# Patient Record
Sex: Female | Born: 1970 | Race: Black or African American | Hispanic: No | Smoking: Former smoker
Health system: Southern US, Community
[De-identification: ages and names within clinical notes are randomized; demographics above are authoritative.]

## PROBLEM LIST (undated history)

## (undated) DIAGNOSIS — Z9889 Other specified postprocedural states: Secondary | ICD-10-CM

## (undated) DIAGNOSIS — F419 Anxiety disorder, unspecified: Secondary | ICD-10-CM

## (undated) DIAGNOSIS — G8929 Other chronic pain: Secondary | ICD-10-CM

## (undated) DIAGNOSIS — E785 Hyperlipidemia, unspecified: Secondary | ICD-10-CM

## (undated) DIAGNOSIS — R519 Headache, unspecified: Secondary | ICD-10-CM

## (undated) DIAGNOSIS — M549 Dorsalgia, unspecified: Secondary | ICD-10-CM

## (undated) DIAGNOSIS — K219 Gastro-esophageal reflux disease without esophagitis: Secondary | ICD-10-CM

## (undated) DIAGNOSIS — G709 Myoneural disorder, unspecified: Secondary | ICD-10-CM

## (undated) DIAGNOSIS — M797 Fibromyalgia: Secondary | ICD-10-CM

## (undated) DIAGNOSIS — F4310 Post-traumatic stress disorder, unspecified: Secondary | ICD-10-CM

## (undated) DIAGNOSIS — F1910 Other psychoactive substance abuse, uncomplicated: Secondary | ICD-10-CM

## (undated) DIAGNOSIS — F32A Depression, unspecified: Secondary | ICD-10-CM

## (undated) DIAGNOSIS — R109 Unspecified abdominal pain: Secondary | ICD-10-CM

## (undated) DIAGNOSIS — D649 Anemia, unspecified: Secondary | ICD-10-CM

## (undated) DIAGNOSIS — F329 Major depressive disorder, single episode, unspecified: Secondary | ICD-10-CM

## (undated) DIAGNOSIS — R51 Headache: Secondary | ICD-10-CM

## (undated) DIAGNOSIS — G629 Polyneuropathy, unspecified: Secondary | ICD-10-CM

## (undated) DIAGNOSIS — M542 Cervicalgia: Secondary | ICD-10-CM

## (undated) DIAGNOSIS — I10 Essential (primary) hypertension: Secondary | ICD-10-CM

## (undated) DIAGNOSIS — R112 Nausea with vomiting, unspecified: Secondary | ICD-10-CM

## (undated) DIAGNOSIS — I2510 Atherosclerotic heart disease of native coronary artery without angina pectoris: Secondary | ICD-10-CM

## (undated) DIAGNOSIS — E669 Obesity, unspecified: Secondary | ICD-10-CM

## (undated) DIAGNOSIS — M1990 Unspecified osteoarthritis, unspecified site: Secondary | ICD-10-CM

## (undated) HISTORY — DX: Other chronic pain: G89.29

## (undated) HISTORY — DX: Other psychoactive substance abuse, uncomplicated: F19.10

## (undated) HISTORY — DX: Unspecified osteoarthritis, unspecified site: M19.90

## (undated) HISTORY — DX: Hyperlipidemia, unspecified: E78.5

## (undated) HISTORY — DX: Obesity, unspecified: E66.9

## (undated) HISTORY — PX: WRIST SURGERY: SHX841

## (undated) HISTORY — DX: Headache: R51

## (undated) HISTORY — DX: Fibromyalgia: M79.7

## (undated) HISTORY — DX: Headache, unspecified: R51.9

## (undated) HISTORY — DX: Myoneural disorder, unspecified: G70.9

## (undated) SURGERY — LAPAROSCOPIC CHOLECYSTECTOMY WITH INTRAOPERATIVE CHOLANGIOGRAM
Anesthesia: General

---

## 2005-11-08 ENCOUNTER — Emergency Department (HOSPITAL_COMMUNITY): Admission: EM | Admit: 2005-11-08 | Discharge: 2005-11-08 | Payer: Self-pay | Admitting: Family Medicine

## 2006-02-24 ENCOUNTER — Encounter: Admission: RE | Admit: 2006-02-24 | Discharge: 2006-02-24 | Payer: Self-pay | Admitting: Family Medicine

## 2006-07-29 ENCOUNTER — Emergency Department (HOSPITAL_COMMUNITY): Admission: EM | Admit: 2006-07-29 | Discharge: 2006-07-29 | Payer: Self-pay | Admitting: Family Medicine

## 2007-05-25 ENCOUNTER — Emergency Department (HOSPITAL_COMMUNITY): Admission: EM | Admit: 2007-05-25 | Discharge: 2007-05-26 | Payer: Self-pay | Admitting: Emergency Medicine

## 2007-05-27 ENCOUNTER — Emergency Department (HOSPITAL_COMMUNITY): Admission: EM | Admit: 2007-05-27 | Discharge: 2007-05-27 | Payer: Self-pay | Admitting: Family Medicine

## 2008-03-05 ENCOUNTER — Emergency Department (HOSPITAL_COMMUNITY): Admission: EM | Admit: 2008-03-05 | Discharge: 2008-03-05 | Payer: Self-pay | Admitting: Emergency Medicine

## 2008-03-22 ENCOUNTER — Ambulatory Visit (HOSPITAL_COMMUNITY): Admission: RE | Admit: 2008-03-22 | Discharge: 2008-03-22 | Payer: Self-pay | Admitting: Orthopedic Surgery

## 2008-07-12 ENCOUNTER — Inpatient Hospital Stay (HOSPITAL_COMMUNITY): Admission: AD | Admit: 2008-07-12 | Discharge: 2008-07-13 | Payer: Self-pay | Admitting: Obstetrics and Gynecology

## 2008-07-27 ENCOUNTER — Ambulatory Visit (HOSPITAL_COMMUNITY): Admission: RE | Admit: 2008-07-27 | Discharge: 2008-07-27 | Payer: Self-pay | Admitting: Obstetrics and Gynecology

## 2008-09-27 ENCOUNTER — Encounter: Admission: RE | Admit: 2008-09-27 | Discharge: 2008-09-27 | Payer: Self-pay | Admitting: Obstetrics and Gynecology

## 2008-12-24 ENCOUNTER — Inpatient Hospital Stay (HOSPITAL_COMMUNITY): Admission: AD | Admit: 2008-12-24 | Discharge: 2008-12-24 | Payer: Self-pay | Admitting: Obstetrics and Gynecology

## 2009-02-20 ENCOUNTER — Inpatient Hospital Stay (HOSPITAL_COMMUNITY): Admission: RE | Admit: 2009-02-20 | Discharge: 2009-02-22 | Payer: Self-pay | Admitting: Obstetrics and Gynecology

## 2009-02-20 ENCOUNTER — Encounter (INDEPENDENT_AMBULATORY_CARE_PROVIDER_SITE_OTHER): Payer: Self-pay | Admitting: Obstetrics and Gynecology

## 2009-07-14 ENCOUNTER — Emergency Department (HOSPITAL_COMMUNITY): Admission: EM | Admit: 2009-07-14 | Discharge: 2009-07-14 | Payer: Self-pay | Admitting: Emergency Medicine

## 2009-12-03 ENCOUNTER — Emergency Department (HOSPITAL_COMMUNITY): Admission: EM | Admit: 2009-12-03 | Discharge: 2009-12-04 | Payer: Self-pay | Admitting: Emergency Medicine

## 2010-06-21 ENCOUNTER — Observation Stay (HOSPITAL_COMMUNITY): Admission: EM | Admit: 2010-06-21 | Discharge: 2010-06-22 | Payer: Self-pay | Admitting: Emergency Medicine

## 2010-08-28 ENCOUNTER — Emergency Department (HOSPITAL_COMMUNITY)
Admission: EM | Admit: 2010-08-28 | Discharge: 2010-08-28 | Payer: Self-pay | Source: Home / Self Care | Admitting: Emergency Medicine

## 2010-09-26 ENCOUNTER — Encounter: Admission: RE | Admit: 2010-09-26 | Payer: Self-pay | Admitting: Orthopedic Surgery

## 2010-11-15 ENCOUNTER — Emergency Department (HOSPITAL_COMMUNITY)
Admission: EM | Admit: 2010-11-15 | Discharge: 2010-11-15 | Payer: Self-pay | Source: Home / Self Care | Admitting: Emergency Medicine

## 2010-11-15 LAB — DIFFERENTIAL
Basophils Relative: 0 % (ref 0–1)
Eosinophils Absolute: 0 10*3/uL (ref 0.0–0.7)
Neutrophils Relative %: 70 % (ref 43–77)

## 2010-11-15 LAB — CBC
Hemoglobin: 11.3 g/dL — ABNORMAL LOW (ref 12.0–15.0)
RBC: 4.38 MIL/uL (ref 3.87–5.11)
WBC: 8.1 10*3/uL (ref 4.0–10.5)

## 2010-11-15 LAB — URINALYSIS, ROUTINE W REFLEX MICROSCOPIC
Bilirubin Urine: NEGATIVE
Leukocytes, UA: NEGATIVE
Nitrite: NEGATIVE
Urine Glucose, Fasting: 250 mg/dL — AB

## 2010-11-15 LAB — URINE MICROSCOPIC-ADD ON

## 2010-11-15 LAB — BASIC METABOLIC PANEL
BUN: 5 mg/dL — ABNORMAL LOW (ref 6–23)
CO2: 24 mEq/L (ref 19–32)
Calcium: 9.3 mg/dL (ref 8.4–10.5)
Creatinine, Ser: 0.78 mg/dL (ref 0.4–1.2)
GFR calc non Af Amer: >60 mL/min (ref >60)
Glucose, Bld: 238 mg/dL — ABNORMAL HIGH (ref 70–99)
Sodium: 136 mEq/L (ref 135–145)

## 2010-11-15 LAB — PREGNANCY, URINE: Preg Test, Ur: NEGATIVE

## 2011-01-03 LAB — BASIC METABOLIC PANEL
CO2: 26 mEq/L (ref 19–32)
Calcium: 9.7 mg/dL (ref 8.4–10.5)
Chloride: 102 mEq/L (ref 96–112)
Creatinine, Ser: 0.58 mg/dL (ref 0.4–1.2)
GFR calc Af Amer: >60 mL/min (ref >60)
GFR calc non Af Amer: >60 mL/min (ref >60)

## 2011-01-03 LAB — GLUCOSE, CAPILLARY: Glucose-Capillary: 306 mg/dL — ABNORMAL HIGH (ref 70–99)

## 2011-01-03 LAB — CBC
Hemoglobin: 12.4 g/dL (ref 12.0–15.0)
MCH: 27.3 pg (ref 26.0–34.0)
RDW: 14.8 % (ref 11.5–15.5)
WBC: 7.5 10*3/uL (ref 4.0–10.5)

## 2011-01-03 LAB — CK TOTAL AND CKMB (NOT AT ARMC)
Relative Index: INVALID (ref 0.0–2.5)
Total CK: 53 U/L (ref 7–177)

## 2011-01-03 LAB — POCT CARDIAC MARKERS
CKMB, poc: <1 ng/mL — ABNORMAL LOW (ref 1.0–8.0)
Myoglobin, poc: 35.9 ng/mL (ref 12–200)

## 2011-01-03 LAB — DIFFERENTIAL
Lymphocytes Relative: 33 % (ref 12–46)
Monocytes Absolute: 0.4 10*3/uL (ref 0.1–1.0)
Neutro Abs: 4.6 10*3/uL (ref 1.7–7.7)

## 2011-01-03 LAB — LIPID PANEL
Cholesterol: 231 mg/dL — ABNORMAL HIGH (ref 0–200)
LDL Cholesterol: UNDETERMINED mg/dL (ref 0–99)
Total CHOL/HDL Ratio: 5.3 RATIO
Triglycerides: 411 mg/dL — ABNORMAL HIGH (ref <150)
VLDL: UNDETERMINED mg/dL (ref 0–40)

## 2011-01-03 LAB — URINALYSIS, ROUTINE W REFLEX MICROSCOPIC
Bilirubin Urine: NEGATIVE
Glucose, UA: >1000 mg/dL — AB
Hgb urine dipstick: NEGATIVE
Ketones, ur: 15 mg/dL — AB
Protein, ur: NEGATIVE mg/dL

## 2011-01-03 LAB — CARDIAC PANEL(CRET KIN+CKTOT+MB+TROPI)
Relative Index: INVALID (ref 0.0–2.5)
Total CK: 54 U/L (ref 7–177)

## 2011-01-03 LAB — PROTIME-INR: Prothrombin Time: 13.1 seconds (ref 11.6–15.2)

## 2011-01-09 LAB — POCT I-STAT, CHEM 8
BUN: <3 mg/dL — ABNORMAL LOW (ref 6–23)
Chloride: 106 mEq/L (ref 96–112)
Creatinine, Ser: 0.5 mg/dL (ref 0.4–1.2)
Sodium: 137 mEq/L (ref 135–145)
TCO2: 26 mmol/L (ref 0–100)

## 2011-01-09 LAB — DIFFERENTIAL
Basophils Relative: 0 % (ref 0–1)
Eosinophils Absolute: 0.1 10*3/uL (ref 0.0–0.7)
Eosinophils Relative: 1 % (ref 0–5)
Lymphs Abs: 2.3 10*3/uL (ref 0.7–4.0)
Monocytes Absolute: 0.4 10*3/uL (ref 0.1–1.0)
Neutro Abs: 8 10*3/uL — ABNORMAL HIGH (ref 1.7–7.7)
Neutrophils Relative %: 74 % (ref 43–77)

## 2011-01-09 LAB — CBC
HCT: 31.7 % — ABNORMAL LOW (ref 36.0–46.0)
MCHC: 33 g/dL (ref 30.0–36.0)
MCV: 84.5 fL (ref 78.0–100.0)
RBC: 3.76 MIL/uL — ABNORMAL LOW (ref 3.87–5.11)

## 2011-01-29 LAB — GLUCOSE, CAPILLARY
Glucose-Capillary: 131 mg/dL — ABNORMAL HIGH (ref 70–99)
Glucose-Capillary: 157 mg/dL — ABNORMAL HIGH (ref 70–99)
Glucose-Capillary: 93 mg/dL (ref 70–99)

## 2011-01-29 LAB — CBC
HCT: 31.1 % — ABNORMAL LOW (ref 36.0–46.0)
Hemoglobin: 10.4 g/dL — ABNORMAL LOW (ref 12.0–15.0)
MCV: 84.7 fL (ref 78.0–100.0)
RBC: 3.67 MIL/uL — ABNORMAL LOW (ref 3.87–5.11)
WBC: 7.5 10*3/uL (ref 4.0–10.5)

## 2011-01-30 LAB — CBC
HCT: 36 % (ref 36.0–46.0)
Hemoglobin: 12.3 g/dL (ref 12.0–15.0)
RBC: 4.29 MIL/uL (ref 3.87–5.11)
RDW: 16.3 % — ABNORMAL HIGH (ref 11.5–15.5)
WBC: 7.7 10*3/uL (ref 4.0–10.5)

## 2011-01-30 LAB — DIFFERENTIAL
Basophils Absolute: 0 10*3/uL (ref 0.0–0.1)
Basophils Relative: 0 % (ref 0–1)
Eosinophils Absolute: 0.1 10*3/uL (ref 0.0–0.7)
Monocytes Absolute: 0.4 10*3/uL (ref 0.1–1.0)
Monocytes Relative: 5 % (ref 3–12)
Neutro Abs: 5.3 10*3/uL (ref 1.7–7.7)
Neutrophils Relative %: 69 % (ref 43–77)

## 2011-01-30 LAB — COMPREHENSIVE METABOLIC PANEL
ALT: 17 U/L (ref 0–35)
Alkaline Phosphatase: 150 U/L — ABNORMAL HIGH (ref 39–117)
BUN: 4 mg/dL — ABNORMAL LOW (ref 6–23)
Chloride: 105 mEq/L (ref 96–112)
Glucose, Bld: 141 mg/dL — ABNORMAL HIGH (ref 70–99)
Potassium: 4.1 mEq/L (ref 3.5–5.1)
Sodium: 133 mEq/L — ABNORMAL LOW (ref 135–145)
Total Bilirubin: 0.1 mg/dL — ABNORMAL LOW (ref 0.3–1.2)
Total Protein: 6.3 g/dL (ref 6.0–8.3)

## 2011-01-31 LAB — URINALYSIS, ROUTINE W REFLEX MICROSCOPIC
Glucose, UA: NEGATIVE mg/dL
Ketones, ur: NEGATIVE mg/dL
Protein, ur: NEGATIVE mg/dL
Urobilinogen, UA: 0.2 mg/dL (ref 0.0–1.0)

## 2011-01-31 LAB — URINE MICROSCOPIC-ADD ON

## 2011-03-05 NOTE — Op Note (Signed)
NAMESERA, HITSMAN              ACCOUNT NO.:  [id number]   MEDICAL RECORD NO.:  [id number]          PATIENT TYPE:  INP   LOCATION:  9102                          FACILITY:  [facility]   PHYSICIAN:  Malachi Pro. Ambrose Mantle, M.D. DATE OF BIRTH:  1971/04/07   DATE OF PROCEDURE:  02/20/2009  DATE OF DISCHARGE:                               OPERATIVE REPORT   PREOPERATIVE DIAGNOSES:  1. Intrauterine pregnancy at 39 plus weeks.  2. Diabetes mellitus type 2.  3. History of high blood pressure.  4. History of depression.  5. Voluntary sterilization.   POSTOPERATIVE DIAGNOSES:  1. Intrauterine pregnancy at 39 plus weeks.  2. Diabetes mellitus type 2.  3. History of high blood pressure.  4. History of depression.  5. Voluntary sterilization.  6. Adhesions with scarring in the abdominal wall.   OPERATION:  Low transverse cervical cesarean section and bilateral tubal  ligation.   OPERATOR:  Malachi Pro. Ambrose Mantle, MD   ASSISTANT:  Zenaida Niece, MD   ANESTHESIA:  Spinal.   PROCEDURE IN DETAIL:  The patient was brought to the operating room and  given a spinal anesthetic by Dr. Malen Gauze.  She was placed in left lateral  tilt position.  The abdomen was prepped with Betadine solution.  The  urethra was prepped and a Foley catheter was inserted to straight drain.  Exam revealed the cervix to be closed more nearly so and the presenting  part was fairly high.  The abdomen was then draped as a sterile field.  The previous incision was fairly low and there was intention above the  midportion of the incision where the incision according to the patient  had come open.  I elected to use entry point above the old incision, I  made a transverse incision, carried the incision in layers down through  the skin, subcutaneous tissue, and fascia.  I did encounter a good bit  of scarring in the fascia and the rectus muscle from the fascia  superiorly and inferiorly, entered the peritoneal cavity and doing so,  stretched the incision, so that I could get the baby out.  Visualize the  lower uterine segment, I made an incision into the superficial layers of  the myometrium with the knife and then used my finger to enter the  amniotic sac.  Clear fluid was obtained, enlarged the incision by  pulling superiorly and inferiorly.  The vertex was then delivered into  the incisional opening and a 6 pounds 8 ounces female infant was  delivered without Apgars of 9 at 1 and 9 at 5 minutes, Dr. Alison Murray  assigned the Apgars.  I took a good bit of time after the cord was  clamped to get the placenta out since the cord blood bank  representatives were there.  The uterus clamped down with Pitocin.  The  placenta was removed.  The inside of the uterus was inspected, found to  be free of any debris.  The incision was pretty close to the bladder at  the right angle, so I closed the right angle first, then closed the  uterus in  the normal fashion with running suture locking 0 Vicryl for  the first layer, nonlocking for the second layer.  Hemostasis was  achieved with a couple of figure-of-eight sutures.  Both tubes and  ovaries and the uterus appeared normal.  I pulled up the midportion of  both tubes, created a window in the mesosalpinx, placed 2 ties of 0  plain catgut proximally and distally, cut out the intervening segment of  tube, hemostasis was adequate.  I reinspected the uterine incision.  There was no significant bleeding, liberally irrigated the incision, and  closed the abdominal wall with interrupted sutures of 0 Vicryl  incorporating the rectus muscle and peritoneum, 2 running sutures of 0  Vicryl on the fascia, running 3-0 Vicryl on the subcu tissue, and  staples on the skin.  The patient seemed to tolerated the procedure  well.  Blood loss about 1000 mL.  Sponge and needle counts were correct.  She was returned to recovery in satisfactory condition.       Malachi Pro. Ambrose Mantle, M.D.  Electronically  Signed     TFH/MEDQ  D:  02/20/2009  T:  02/20/2009  Job:  [id number]

## 2011-03-05 NOTE — Discharge Summary (Signed)
Vanessa Case, HADLOCK              ACCOUNT NO.:  [id number]   MEDICAL RECORD NO.:  [id number]          PATIENT TYPE:  INP   LOCATION:  9102                          [facility]ACILITY:  [facility]   PHYSICIAN:  Malachi Pro. Ambrose Mantle, M.D. DATE OF BIRTH:  1970-11-05   DATE OF ADMISSION:  02/20/2009  DATE OF DISCHARGE:  02/22/2009                               DISCHARGE SUMMARY   This is a 40 year old black female para 2-0-0-2, gravida 3, EDC Feb 24, 2009, admitted for repeat C-section after 2 previous C-sections.  Blood  group and type was O+, negative antibody, sickle cell negative, RPR  nonreactive, rubella immune, hepatitis B surface antigen negative, HIV  negative, GC and Chlamydia negative, group B strep negative.  The  patient had diabetes controlled with glyburide and metformin.  She took  Valtrex the last month of pregnancy.  She was admitted and underwent a  low-transverse cervical C-section by Dr. Ambrose Mantle and also tubal ligation.  Procedure was done under spinal anesthesia without known complications.  Postpartum, the patient did quite well, had no significant problems,  tolerated ambulation well, tolerated a regular diet, voided well without  difficulty, passed flatus freely and on the second postpartum day, she  was ready for discharge.   LABORATORY DATA:  Initial hemoglobin of 12.3, hematocrit 36.0, white  count 77,100, platelet count of 150,000.  Followup hemoglobin 10.4,  hematocrit 31.1, and platelet count 130,000.  Comprehensive metabolic  profile showed a sodium of 133, glucose of 141, alkaline phosphatase of  150, albumin of 2.6, otherwise within normal limits.  RPR was  nonreactive.  Capillary blood glucoses after surgery were 93, 92, 78,  131, 110, and 157.   FINAL DIAGNOSES:  Intrauterine pregnancy at 39 weeks delivered vertex by  cesarean section, voluntary sterilization, diabetes, history of high  blood pressure, history of depression, alcohol and drug abuse.   OPERATION:   Low-transverse cervical C-section and bilateral tubal  ligation.   FINAL CONDITION:  Improved.   INSTRUCTIONS:  Our regular discharge instruction booklet.  The patient  was given a prescription for Darvocet-N 100, 36 tablets 1 every 3-4  hours as needed for pain and Motrin 600 mg 30 tablets 1 every 6 hours as  needed for pain.  She is to return my office in 2 days to have her  staples removed.       Malachi Pro. Ambrose Mantle, M.D.  Electronically Signed     TFH/MEDQ  D:  02/22/2009  T:  02/22/2009  Job:  [id number]

## 2011-03-05 NOTE — H&P (Signed)
Vanessa Case, Vanessa Case              ACCOUNT NO.:  [id number]   MEDICAL RECORD NO.:  [id number]          PATIENT TYPE:  INP   LOCATION:                                [facility]   PHYSICIAN:  Malachi Pro. Ambrose Mantle, M.D. DATE OF BIRTH:  01-29-1971   DATE OF ADMISSION:  02/20/2009  DATE OF DISCHARGE:                              HISTORY & PHYSICAL   PRESENT ILLNESS:  A 40 year old black female para 2-0-0-2, gravida 3,  EDC Feb 24, 2009 admitted for repeat C-section after having two prior C-  sections.  She also wants bilateral tubal ligation.  This patient had a  vaginal ultrasound on July 27, 2008 that showed a gestational age of [redacted]  weeks and 3 days with an [facility] of Feb 26, 2009.  At the time of the onset  of her pregnancy, she was being treated for diabetes.  She had a history  of hypertension and a history of herpes simplex virus, as well as  depression and alcohol and drug abuse.  She was on glipizide 10 mg twice  a day, metformin, Prozac, and Norvasc.  She was told to stop the  Norvasc.  She had a hemoglobin A1c of 6.3.  She then had an 18-week  ultrasound that was compatible with dates placing her due date at Feb 26, 2009.  Some of the anatomy was not seen; so, this ultrasound was  repeated at 21 weeks and 5 days and anatomy was seen.  The patient  underwent a fetal ultrasound by Dr. Elizebeth Brooking that showed a normal fetal  echocardiogram.  The patient continued on her metformin and glipizide.  She kept good records of her blood sugars four times a day.  They were  not perfect but rarely were significantly elevated.  She did undergo a  repeat hemoglobin A1c on January 12, 2009 which was 6.3.  Lipocyanine was  unchanged at 221.  She underwent renal function tests on March 22  showing total protein of 125 mg in 24 hours, creatinine clearance of  128, and serum creatinine of 0.55.  This patient's blood group and type  is O+, negative antibodies, sickle cell negative, RPR nonreactive,  rubella  immune, hepatitis B surface antigen negative, HIV negative, GC  and chlamydia negative, group B strep negative.  Her glyburide is at 5  mg every day, metformin 1000 mg twice a day, and she has been taking  Valtrex since 36 weeks' gestation.  Prior to her last office visit, her  maximum fasting blood sugar in the prior week had been 102, 1-hour blood  sugar after breakfast 146 maximum, after lunch 138, and after supper  128.  She is admitted now for repeat C-section and she desires tubal  ligation.   PAST MEDICAL HISTORY:  Reveals allergy to hydrocodone which caused  vomiting.  Illnesses:  She does have a history of diabetes, drug and  alcohol abuse depression, and high blood pressure.  She also has a  history of genital herpes.  Operations:  She has had two cesarean  sections.   OBSTETRICAL HISTORY:  In April 1993,  she delivered by C-section for  fetal stress.  The baby was a 6 pound 12 ounce female and in November  1995, she delivered a 7 pound 2 ounce female by cesarean section.  She  apparently had active herpes at that time.   SOCIAL HISTORY:  Alcohol, tobacco, and drugs:  None at this time.   FAMILY HISTORY:  Mother has diabetes.  Paternal grandmother had CVAs and  high blood pressure.  Paternal grandfather had heart disease.   PHYSICAL EXAMINATION:  This is a well-developed, well-nourished obese  black female in no distress.  Blood pressure is 126/80, pulse is 80, weight is 210 pounds.  HEENT:  Normal.  LUNGS:  Clear to auscultation.  HEART:  Normal size and sounds.  No murmurs.  ABDOMEN:  Soft.  Fundal height on 04/29 was 39 cm.  Fetal heart tones  were normal.  The cervix was long and closed.   IMPRESSION:  Intrauterine pregnancy at 39+ weeks, prior C-section x2,  request for voluntary sterilization, diabetes, history of high blood  pressure even though it has been controlled without medication during  this pregnancy, history of herpes simplex virus, history of depression,   alcohol and drug abuse.  The patient is prepared for C-section and tubal  ligation.      Malachi Pro. Ambrose Mantle, M.D.  Electronically Signed     TFH/MEDQ  D:  02/19/2009  T:  02/19/2009  Job:  [id number]

## 2011-03-15 ENCOUNTER — Emergency Department (HOSPITAL_COMMUNITY): Payer: Medicaid Other

## 2011-03-15 ENCOUNTER — Inpatient Hospital Stay (HOSPITAL_COMMUNITY)
Admission: EM | Admit: 2011-03-15 | Discharge: 2011-03-17 | DRG: 313 | Disposition: A | Discharge status: Home / Self Care | Payer: Medicaid Other | Attending: Internal Medicine | Admitting: Internal Medicine

## 2011-03-15 DIAGNOSIS — Z87891 Personal history of nicotine dependence: Secondary | ICD-10-CM

## 2011-03-15 DIAGNOSIS — F319 Bipolar disorder, unspecified: Secondary | ICD-10-CM | POA: Diagnosis present

## 2011-03-15 DIAGNOSIS — I10 Essential (primary) hypertension: Secondary | ICD-10-CM | POA: Diagnosis present

## 2011-03-15 DIAGNOSIS — R079 Chest pain, unspecified: Secondary | ICD-10-CM

## 2011-03-15 DIAGNOSIS — Z9119 Patient's noncompliance with other medical treatment and regimen: Secondary | ICD-10-CM

## 2011-03-15 DIAGNOSIS — Z91199 Patient's noncompliance with other medical treatment and regimen due to unspecified reason: Secondary | ICD-10-CM

## 2011-03-15 DIAGNOSIS — E669 Obesity, unspecified: Secondary | ICD-10-CM | POA: Diagnosis present

## 2011-03-15 DIAGNOSIS — E785 Hyperlipidemia, unspecified: Secondary | ICD-10-CM | POA: Diagnosis present

## 2011-03-15 DIAGNOSIS — R0789 Other chest pain: Principal | ICD-10-CM | POA: Diagnosis present

## 2011-03-15 DIAGNOSIS — E119 Type 2 diabetes mellitus without complications: Secondary | ICD-10-CM | POA: Diagnosis present

## 2011-03-15 DIAGNOSIS — Z79899 Other long term (current) drug therapy: Secondary | ICD-10-CM

## 2011-03-15 DIAGNOSIS — Z8249 Family history of ischemic heart disease and other diseases of the circulatory system: Secondary | ICD-10-CM

## 2011-03-15 DIAGNOSIS — E781 Pure hyperglyceridemia: Secondary | ICD-10-CM | POA: Diagnosis present

## 2011-03-15 DIAGNOSIS — Z7982 Long term (current) use of aspirin: Secondary | ICD-10-CM

## 2011-03-15 LAB — LIPID PANEL
HDL: 39 mg/dL — ABNORMAL LOW (ref >39)
Triglycerides: 226 mg/dL — ABNORMAL HIGH (ref <150)
VLDL: 45 mg/dL — ABNORMAL HIGH (ref 0–40)

## 2011-03-15 LAB — GLUCOSE, CAPILLARY: Glucose-Capillary: 225 mg/dL — ABNORMAL HIGH (ref 70–99)

## 2011-03-15 LAB — BASIC METABOLIC PANEL
BUN: 6 mg/dL (ref 6–23)
CO2: 29 mEq/L (ref 19–32)
Chloride: 100 mEq/L (ref 96–112)
Glucose, Bld: 155 mg/dL — ABNORMAL HIGH (ref 70–99)
Potassium: 3.4 mEq/L — ABNORMAL LOW (ref 3.5–5.1)

## 2011-03-15 LAB — CBC
HCT: 33.7 % — ABNORMAL LOW (ref 36.0–46.0)
MCV: 79.7 fL (ref 78.0–100.0)
RBC: 4.23 MIL/uL (ref 3.87–5.11)
WBC: 8.5 10*3/uL (ref 4.0–10.5)

## 2011-03-15 LAB — PROTIME-INR: INR: 0.93 (ref 0.00–1.49)

## 2011-03-15 LAB — DIFFERENTIAL
Eosinophils Relative: 1 % (ref 0–5)
Lymphocytes Relative: 40 % (ref 12–46)
Lymphs Abs: 3.4 10*3/uL (ref 0.7–4.0)
Neutrophils Relative %: 54 % (ref 43–77)

## 2011-03-15 LAB — TROPONIN I: Troponin I: <0.3 ng/mL (ref <0.30)

## 2011-03-15 LAB — D-DIMER, QUANTITATIVE: D-Dimer, Quant: 0.24 ug/mL-FEU (ref 0.00–0.48)

## 2011-03-15 LAB — APTT: aPTT: 32 seconds (ref 24–37)

## 2011-03-16 ENCOUNTER — Inpatient Hospital Stay (HOSPITAL_COMMUNITY): Payer: Medicaid Other

## 2011-03-16 LAB — COMPREHENSIVE METABOLIC PANEL
ALT: 33 U/L (ref 0–35)
AST: 30 U/L (ref 0–37)
Albumin: 3.4 g/dL — ABNORMAL LOW (ref 3.5–5.2)
CO2: 30 mEq/L (ref 19–32)
Calcium: 9.5 mg/dL (ref 8.4–10.5)
GFR calc Af Amer: >60 mL/min (ref >60)
GFR calc non Af Amer: >60 mL/min (ref >60)
Sodium: 138 mEq/L (ref 135–145)
Total Protein: 7 g/dL (ref 6.0–8.3)

## 2011-03-16 LAB — CBC
HCT: 32 % — ABNORMAL LOW (ref 36.0–46.0)
Hemoglobin: 10.2 g/dL — ABNORMAL LOW (ref 12.0–15.0)
MCHC: 31.9 g/dL (ref 30.0–36.0)
MCV: 80.4 fL (ref 78.0–100.0)
RDW: 15.2 % (ref 11.5–15.5)
WBC: 9.1 10*3/uL (ref 4.0–10.5)

## 2011-03-16 LAB — GLUCOSE, CAPILLARY
Glucose-Capillary: 251 mg/dL — ABNORMAL HIGH (ref 70–99)
Glucose-Capillary: 79 mg/dL (ref 70–99)

## 2011-03-16 LAB — CARDIAC PANEL(CRET KIN+CKTOT+MB+TROPI)
CK, MB: 0.5 ng/mL (ref 0.3–4.0)
Relative Index: INVALID (ref 0.0–2.5)

## 2011-03-16 LAB — HEPARIN LEVEL (UNFRACTIONATED): Heparin Unfractionated: 0.82 IU/mL — ABNORMAL HIGH (ref 0.30–0.70)

## 2011-03-16 MED ORDER — TECHNETIUM TC 99M TETROFOSMIN IV KIT
30.0000 | PACK | Freq: Once | INTRAVENOUS | Status: AC | PRN
  Administered 2011-03-16: 30 via INTRAVENOUS

## 2011-03-17 ENCOUNTER — Other Ambulatory Visit (HOSPITAL_COMMUNITY): Payer: Self-pay

## 2011-03-17 LAB — CBC
HCT: 31.8 % — ABNORMAL LOW (ref 36.0–46.0)
Hemoglobin: 10.3 g/dL — ABNORMAL LOW (ref 12.0–15.0)
MCH: 26 pg (ref 26.0–34.0)
MCHC: 32.4 g/dL (ref 30.0–36.0)
MCV: 80.3 fL (ref 78.0–100.0)
Platelets: 312 K/uL (ref 150–400)
RBC: 3.96 MIL/uL (ref 3.87–5.11)
RDW: 15.1 % (ref 11.5–15.5)
WBC: 10.6 K/uL — ABNORMAL HIGH (ref 4.0–10.5)

## 2011-03-17 LAB — HEPARIN LEVEL (UNFRACTIONATED): Heparin Unfractionated: 0.6 [IU]/mL (ref 0.30–0.70)

## 2011-03-21 NOTE — Discharge Summary (Signed)
Vanessa Case, Vanessa Case              ACCOUNT NO.:  [id number]  MEDICAL RECORD NO.:  [id number]           PATIENT TYPE:  I  LOCATION:  3714                         FACILITY:  [facility]  PHYSICIAN:  Cassell Clement, M.D. DATE OF BIRTH:  18-Jan-1971  DATE OF ADMISSION:  03/15/2011 DATE OF DISCHARGE:  03/17/2011                              DISCHARGE SUMMARY   PRIMARY CARE PHYSICIAN:  [facility].  CARDIOLOGIST:  Hillis Range, MD  FINAL DISCHARGE DIAGNOSES: 1. Atypical chest pain.     a.     Stress Myoview, completed Mar 15, 2001, which is a 2-day      stress test revealed normal left ventricular systolic function      with ejection fraction 67% with no perfusion defects identified.  FINAL DISCHARGE DIAGNOSES: 1. Musculoskeletal chest pain. 2. Diabetes. 3. Hypertension. 4. Hyperlipidemia. 5. Obesity. 6. Family history of coronary artery disease. 7. Bipolar disorder and depression. 8. History of noncompliance with medication secondary to financial     issues.  HOSPITAL COURSE:  This is a 40 year old obese, African American female who was admitted on Mar 13, 2011, with complaints of shortness of breath, chest discomfort described as throbbing pain.  The patient's heart rate was mildly elevated.  The patient took some Advil, which was not helpful to her.  The night prior to admission, she had left jaw aching and left hand tingling and slept poorly.  She developed chest heaviness graded 8/10.  Worse with supine position, better with standing.  The patient came to the emergency room where she was given aspirin and sublingual nitroglycerin.  The pain went away and heaviness decreased.  She was admitted to rule out myocardial infarction.  It was noted that the patient's EKG was normal with negative troponins.  The patient did undergo a 2-day stress Myoview with results as described above.  It was found that the patient had no evidence of coronary ischemia and also had negative  troponins throughout hospitalization.  It was noted that her hemoglobinA1c was elevated at 9.6.  The patient was seen and examined by Dr. Patty Sermons and found to be stable for discharge. She is to follow up with her primary care physician and have better medical management considering her diabetes.  No further cardiac intervention or testing is warranted at this time.  LABS ON DISCHARGE:  Hemoglobin 10.3, hematocrit 31.8, white blood cells 10.6, platelet 312.  Cardiac enzymes negative x3  less than 0.30. Sodium 138, potassium 3.7, chloride 100, CO2 30, glucose 235, BUN 11, creatinine 0.83.  LFTs were within normal limits.  Hemoglobin A1c was 9.6 with a mean plasma glucose of 229, D-dimer 0.24.  RADIOLOGY:  Chest x-ray revealing no acute findings.  Nuclear medicine stress Myoview negative for ischemia.  VITAL SIGNS ON DISCHARGE:  Blood pressure 117/75, pulse 77, respirations 18, temperature 97.9, O2 sat 100% on room air.  DISCHARGE MEDICATIONS: 1. Lisinopril 20 mg 1 p.o. daily. 2. Vitamin B12 daily. 3. Prozac 20 mg daily. 4. Glipizide 10 mg daily. 5. Aspirin 81 mg daily. 6. Pantoprazole 80 mg b.i.d. 7. Metoprolol 12.5 mg b.i.d.  ALLERGIES:  HYDROCODONE.  FOLLOWUP  PLANS AND APPOINTMENT: 1. The patient will follow up with her primary care physician.  She     will not need any further followup concerning Cardiology, etiology     with no further invasive testing or other testing at this time.     Time spent with the patient to include physician time 30 minutes.     Bettey Mare. Lyman Bishop, NP   ______________________________ Cassell Clement, M.D.    KML/MEDQ  D:  03/17/2011  T:  03/17/2011  Job:  [id number]  cc:   [facility]  Electronically Signed by Joni Reining NP on 03/18/2011 09:04:33 PM Electronically Signed by Cassell Clement M.D. on 03/21/2011 12:48:16 PM

## 2011-04-14 NOTE — H&P (Signed)
Vanessa Case, Vanessa Case              ACCOUNT NO.:  [id number]  MEDICAL RECORD NO.:  [id number]           PATIENT TYPE:  I  LOCATION:  3714                         FACILITY:  [facility]  PHYSICIAN:  Hillis Range, MD       DATE OF BIRTH:  1971-08-30  DATE OF ADMISSION:  03/15/2011 DATE OF DISCHARGE:                             HISTORY & PHYSICAL   PRIMARY CARE PHYSICIAN:  At [facility].  PRIMARY CARDIOLOGIST:  None.  CHIEF COMPLAINT:  Chest pain.  HISTORY OF PRESENT ILLNESS:  Vanessa Case is a 40 year old female with no history of coronary artery disease.  She had onset on Mar 13, 2011, of tacky palpitations and shortness of breath.  Shortly after that, she developed a throbbing chest pain.  She checked her blood pressure which was normal and her heart rate was slightly elevated at 114.  Advil did not help.  She had multiple episodes, each lasting about 30 minutes. She would note some increase with activity and they were relieved by rest.  Last p.m., she had left jaw aching and left hand tingling.  Her chest was still throbbing, so she slept poorly.  She also developed chest heaviness at an 8/10.  It was worse with a supine position, better with standing and decreased by belching.  Gas-X helped the back pain. This a.m., she felt short of breath and weak.  She was still having chest pain, so she came to the emergency room.  She got aspirin 81 mg x4 and sublingual nitroglycerin x1.  The throbbing chest pain went away. The heaviness had decreased to a 5/10 by arrival at the emergency room and was briefly relieved by the nitroglycerin, but is currently a 5/10.  PAST MEDICAL HISTORY: 1. History of musculoskeletal chest pain, for which, she was     hospitalized in 2011, different from this pain per the patient. 2. Diabetes. 3. Hypertension. 4. Hyperlipidemia. 5. Obesity. 6. Family history of coronary artery disease. 7. Bipolar disorder and depression. 8. History of  noncompliance with medications secondary to financial     issues.  PAST SURGICAL HISTORY:  She is status post bilateral tubal ligation as well as C-sections.  ALLERGIES:  HYDROCODONE.  CURRENT MEDICATIONS: 1. Diphenhydramine at bedtime p.r.n. 2. Glipizide daily. 3. Ibuprofen p.r.n. 4. Lisinopril/hydrochlorothiazide 20/25 daily. 5. Metformin 1000 mg daily. 6. Vitamins daily. 7. Prozac 20 mg a day.  SOCIAL HISTORY:  She lives in [place] with her husband and 2 children.  She is currently unemployed as her job has ended.  She has approximately 10 pack-year history of tobacco use, but quit 5 years ago. She remotely quit alcohol.  She has no history of drug use.  FAMILY HISTORY:  Her mother is alive at 21 with no heart disease and her father died in his 83s with a history of coronary artery disease.  She has no siblings with coronary artery disease.  REVIEW OF SYSTEMS:  She has not had any fevers, chills, or sweats.  She has regular reflux symptoms, but has never had melena.  She has not had nausea or vomiting.  She has not had any  particular problems with depression or anxiety, although she feels stressed because of financial issues.  Full 14-point review of systems is otherwise negative except as stated in the HPI.  PHYSICAL EXAMINATION:  VITAL SIGNS:  Temperature 98.6, blood pressure 127/82, pulse 103, respiratory rate 16, O2 saturation 100% on room air. GENERAL:  She is a well-developed [place] female in no acute distress. HEENT:  Normal. NECK:  There is no lymphadenopathy, thyromegaly, bruit, or JVD noted. CV:  Her heart is regular in rate and rhythm with an S1, S2 and no significant murmur, rub, or gallop is noted.  Distal pulses are intact in all 4 extremities. LUNGS:  Clear to auscultation bilaterally. SKIN:  No rashes or lesions are noted. ABDOMEN:  Soft and nontender with active bowel sounds. EXTREMITIES:  There is no cyanosis, clubbing, or edema  noted. MUSCULOSKELETAL:  There is no joint deformity or effusions.  No spine or CVA tenderness. NEUROLOGIC:  She is alert and oriented.  Cranial nerves II through XII grossly intact.  Chest x-rays showed no acute disease.  EKG; sinus rhythm, rate 91 with no acute ischemic changes.  LABORATORY VALUES:  Initial CK-MB and troponin is negative for MI. Hemoglobin 11.0, hematocrit 33.7, WBCs 8.5, platelets 346.  Sodium 139, potassium 3.4, chloride 100, CO2 of 29, BUN 6, creatinine 0.54, glucose 155.  IMPRESSION:  Vanessa Case was seen today by Dr. Johney Frame, the patient evaluated and the data reviewed.  She is a 40 year old African American female with obesity, diabetes, hypertension, and hyperlipidemia, who now presents with a exertional chest pain described as a throbbing and shortness of breath.  She reports relief of pain with rest.  Given multiple cardiac risk factors, we will admit to telemetry and rule out acute coronary syndrome.  If her cardiac enzymes remain negative, she will get a Myoview in a.m.  A Lexiscan has been ordered.  For a full Myoview, she would be a 2-day protocol, so we will get initial stress images and have those processed.  If there are any defects, the attending can review this and decide on treatment.  If her cardiac enzymes are elevated, cardiac catheterization is indicated.  She will be placed on a heparin drip.  We will check a D-dimer for further risk stratification of pulmonary embolus, although this is less likely.  She will be continued on her home medications.     Theodore Demark, PA-C   ______________________________ Hillis Range, MD    RB/MEDQ  D:  03/15/2011  T:  03/16/2011  Job:  [id number]  Electronically Signed by Theodore Demark PA-C on 04/04/2011 03:22:09 PM Electronically Signed by Hillis Range MD on 04/14/2011 04:49:10 PM

## 2011-05-20 ENCOUNTER — Other Ambulatory Visit (HOSPITAL_COMMUNITY): Payer: Self-pay | Admitting: Obstetrics and Gynecology

## 2011-05-20 ENCOUNTER — Other Ambulatory Visit: Payer: Self-pay | Admitting: Obstetrics and Gynecology

## 2011-05-20 DIAGNOSIS — N644 Mastodynia: Secondary | ICD-10-CM

## 2011-05-20 DIAGNOSIS — R102 Pelvic and perineal pain: Secondary | ICD-10-CM

## 2011-05-22 ENCOUNTER — Ambulatory Visit (HOSPITAL_COMMUNITY): Admission: RE | Admit: 2011-05-22 | Payer: Self-pay | Source: Ambulatory Visit

## 2011-05-22 ENCOUNTER — Ambulatory Visit (HOSPITAL_COMMUNITY)
Admission: RE | Admit: 2011-05-22 | Discharge: 2011-05-22 | Disposition: A | Discharge status: Home / Self Care | Payer: Medicaid Other | Source: Ambulatory Visit | Attending: Obstetrics and Gynecology | Admitting: Obstetrics and Gynecology

## 2011-05-22 DIAGNOSIS — N83209 Unspecified ovarian cyst, unspecified side: Secondary | ICD-10-CM | POA: Insufficient documentation

## 2011-05-22 DIAGNOSIS — N949 Unspecified condition associated with female genital organs and menstrual cycle: Secondary | ICD-10-CM | POA: Insufficient documentation

## 2011-05-22 DIAGNOSIS — N920 Excessive and frequent menstruation with regular cycle: Secondary | ICD-10-CM | POA: Insufficient documentation

## 2011-05-22 DIAGNOSIS — R102 Pelvic and perineal pain: Secondary | ICD-10-CM

## 2011-05-22 DIAGNOSIS — N926 Irregular menstruation, unspecified: Secondary | ICD-10-CM | POA: Insufficient documentation

## 2011-07-01 ENCOUNTER — Emergency Department (HOSPITAL_COMMUNITY)
Admission: EM | Admit: 2011-07-01 | Discharge: 2011-07-02 | Disposition: A | Discharge status: Home / Self Care | Payer: Medicaid Other | Attending: Emergency Medicine | Admitting: Emergency Medicine

## 2011-07-01 DIAGNOSIS — R059 Cough, unspecified: Secondary | ICD-10-CM | POA: Insufficient documentation

## 2011-07-01 DIAGNOSIS — E119 Type 2 diabetes mellitus without complications: Secondary | ICD-10-CM | POA: Insufficient documentation

## 2011-07-01 DIAGNOSIS — R05 Cough: Secondary | ICD-10-CM | POA: Insufficient documentation

## 2011-07-01 DIAGNOSIS — I10 Essential (primary) hypertension: Secondary | ICD-10-CM | POA: Insufficient documentation

## 2011-07-01 DIAGNOSIS — E669 Obesity, unspecified: Secondary | ICD-10-CM | POA: Insufficient documentation

## 2011-07-01 DIAGNOSIS — J3489 Other specified disorders of nose and nasal sinuses: Secondary | ICD-10-CM | POA: Insufficient documentation

## 2011-07-01 DIAGNOSIS — J029 Acute pharyngitis, unspecified: Secondary | ICD-10-CM | POA: Insufficient documentation

## 2011-07-01 LAB — RAPID STREP SCREEN (MED CTR MEBANE ONLY): Streptococcus, Group A Screen (Direct): NEGATIVE

## 2011-07-22 LAB — DIFFERENTIAL
Eosinophils Absolute: 0.1
Lymphocytes Relative: 29
Lymphs Abs: 2.6
Monocytes Relative: 6
Neutrophils Relative %: 63

## 2011-07-22 LAB — BASIC METABOLIC PANEL
Chloride: 102
Creatinine, Ser: 0.52
GFR calc Af Amer: >60
GFR calc non Af Amer: >60
Potassium: 3.9

## 2011-07-22 LAB — CBC
MCV: 85.4
RBC: 4.03
WBC: 8.9

## 2011-08-05 LAB — URINALYSIS, ROUTINE W REFLEX MICROSCOPIC
Glucose, UA: NEGATIVE
Hgb urine dipstick: NEGATIVE
Protein, ur: NEGATIVE
Specific Gravity, Urine: 1.027
pH: 7

## 2011-08-05 LAB — POCT URINALYSIS DIP (DEVICE)
Glucose, UA: 250 — AB
Ketones, ur: 15 — AB
Nitrite: POSITIVE — AB
Operator id: 247071

## 2011-08-05 LAB — PREGNANCY, URINE: Preg Test, Ur: NEGATIVE

## 2011-08-05 LAB — POCT PREGNANCY, URINE
Operator id: 247071
Preg Test, Ur: NEGATIVE

## 2011-10-05 ENCOUNTER — Other Ambulatory Visit: Payer: Self-pay | Admitting: Obstetrics and Gynecology

## 2011-10-11 ENCOUNTER — Encounter (HOSPITAL_COMMUNITY): Payer: Self-pay | Admitting: Pharmacist

## 2011-10-25 ENCOUNTER — Encounter (HOSPITAL_COMMUNITY): Payer: Self-pay

## 2011-10-25 ENCOUNTER — Encounter (HOSPITAL_COMMUNITY)
Admission: RE | Admit: 2011-10-25 | Discharge: 2011-10-25 | Disposition: A | Discharge status: Home / Self Care | Payer: Medicaid Other | Source: Ambulatory Visit | Attending: Obstetrics and Gynecology | Admitting: Obstetrics and Gynecology

## 2011-10-25 HISTORY — DX: Anxiety disorder, unspecified: F41.9

## 2011-10-25 HISTORY — DX: Anemia, unspecified: D64.9

## 2011-10-25 HISTORY — DX: Gastro-esophageal reflux disease without esophagitis: K21.9

## 2011-10-25 HISTORY — DX: Depression, unspecified: F32.A

## 2011-10-25 HISTORY — DX: Major depressive disorder, single episode, unspecified: F32.9

## 2011-10-25 HISTORY — DX: Essential (primary) hypertension: I10

## 2011-10-25 LAB — CBC
Hemoglobin: 10.9 g/dL — ABNORMAL LOW (ref 12.0–15.0)
RBC: 4.28 MIL/uL (ref 3.87–5.11)
WBC: 8.5 10*3/uL (ref 4.0–10.5)

## 2011-10-25 LAB — COMPREHENSIVE METABOLIC PANEL
BUN: 8 mg/dL (ref 6–23)
CO2: 28 mEq/L (ref 19–32)
Calcium: 9.9 mg/dL (ref 8.4–10.5)
Creatinine, Ser: 0.6 mg/dL (ref 0.50–1.10)
GFR calc Af Amer: >90 mL/min (ref >90)
GFR calc non Af Amer: >90 mL/min (ref >90)
Glucose, Bld: 115 mg/dL — ABNORMAL HIGH (ref 70–99)

## 2011-10-25 NOTE — Patient Instructions (Addendum)
   Your procedure is scheduled on: Wednesday January 9th  Enter through the [employer] [facility] at: 7am Pick up the phone at the desk and dial [phone] and inform us of your arrival.  Please call this number if you have any problems the morning of surgery: [phone]  Remember: Do not eat food after midnight: Tuesday Do not drink clear liquids after: midnight Tuesday Take these medicines the morning of surgery with a SIP OF WATER:prozac, lisinopril/hct, protonix, metoprolol, Hold Metformin morning of surgery and evening dose 1/8. Hold glipizide morning of surgery.  Do not wear jewelry, make-up, or FINGER nail polish Do not wear lotions, powders, perfumes or deodorant. Do not shave 48 hours prior to surgery. Do not bring valuables to the hospital.  Leave suitcase in the car. After Surgery it may be brought to your room. For patients being admitted to the hospital, checkout time is 11:00am the day of discharge.    Remember to use your hibiclens as instructed.Please shower with 1/2 bottle the evening before your surgery and the other 1/2 bottle the morning of surgery.

## 2011-10-29 MED ORDER — CEFAZOLIN SODIUM-DEXTROSE 2-3 GM-% IV SOLR
2.0000 g | INTRAVENOUS | Status: AC
  Administered 2011-10-30: 2 g via INTRAVENOUS
  Filled 2011-10-29: qty 50

## 2011-10-29 NOTE — H&P (Signed)
NAMEJAKHIA, BUXTON              ACCOUNT NO.:  [id number]  MEDICAL RECORD NO.:  [id number]  LOCATION:                                 FACILITY:  PHYSICIAN:  Malachi Pro. Ambrose Mantle, M.D. DATE OF BIRTH:  Jul 21, 1971  DATE OF ADMISSION:  10/30/2011 DATE OF DISCHARGE:                             HISTORY & PHYSICAL   HISTORY OF PRESENT ILLNESS:  This is a 41 year old black female, para 3- 0-0-3 who is admitted to the hospital for abdominal hysterectomy, possible removal of one or both tubes and ovaries because of severe menorrhagia, dysmenorrhea, and history of complex cysts in both ovaries. The patient had a cesarean section in May 2010 and at that time her both ovaries and tubes looked normal.  In March 2011, she came to the office complaining of severe menorrhagia and dysmenorrhea.  She stated that she used 24 pads and 3 depends a day and rated her pain as 9/10 and also complained of significant dyspareunia.  She was advised Motrin and an ultrasound was scheduled to evaluate the abnormality.  She did not follow through with the ultrasound, but in August 2012 she did have an ultrasound for pain that showed bilateral ovarian abnormalities.  The right ovary was 6.7 x 4.6 x 5.4 cm and the left ovary was slightly smaller, but both contained abnormal-appearing complex cyst.  On July 12, 2011, the patient underwent another ultrasound that showed the left ovary had returned to normal, but the right ovary had two complex cysts, one measuring 2 x 1.2 cm and the other 4.4 x 3.2 x 3.5 cm.  There was a small amount of fluid.  CA-125 was 13.  Final ultrasound on August 20, 2011, showed normal left ovary that abutted the lateral margin of the uterus.  The right ovary was 6.64 x 7.14 x 3.39 cm and contained an increased echogenicity within the debris in the dependent portion and the possibility of hydrosalpinx was raised.  The patient has continued to have her same symptoms of severe  dysmenorrhea and menorrhagia and even though on today's exam the right ovary is not apparently as enlarged.  We are going to remove the uterus and depending on the findings remove part or all of the adnexal.  PAST MEDICAL HISTORY:  Allergy to HYDROCODONE.  No latex allergy.  Medical history includes depression, type 2 diabetes, gonorrhea, hypertension, polycystic ovaries, vaginal trichomoniasis, and Chlamydia.  SURGERY HISTORY:  C-section x3 and tubal ligation with her last C- section.  Medications:  Doxepin 50 mg a day, fluoxetine 40 mg in the morning, lisinopril and hydrochlorothiazide 20/12.5 one daily, Motrin 4 times a day as needed for pain.  Obstetric history:  In 1993 a C-section, a C-section in 1996, and a C- section and tubal ligation in 2010.  PHYSICAL EXAMINATION:  GENERAL:  A well-nourished, somewhat obese black female, in no distress. VITAL SIGNS:  Weight is 210 pounds, height 5 feet 6 inches, pulse 99, blood pressure 126/78. HEAD, EYES, NOSE, AND THROAT:  Normal. NECK:  Thyroid normal. LUNGS:  Clear to auscultation. HEART:  Normal size and sounds. BREAST:  Large without masses. ABDOMEN:  Soft and nontender.  No masses are palpable.  There  are 2 transverse scars in the lower abdomen. PELVIC:  Vulva and vagina are clean.  The cervix is clean.  The uterus is posterior, normal size.  It is tender.  The left adnexa is clear. Right adnexa is full, but no definite masses palpable.  Rectovaginal confirms that there is no cul-de-sac nodularity.  ADMITTING IMPRESSION:  Menorrhagia, dysmenorrhea, history of complex ovarian cyst bilaterally, most recently complex ovarian cyst only on the right tube.  The patient is admitted for abdominal hysterectomy and both adnexal will be evaluated at the time of surgery.  She has been counseled about all the risks of surgery including but not limited to heart attack, stroke, pulmonary embolus, wound disruption, hemorrhage with need  for reoperation and/or transfusion, fistula formation, nerve injury, intestinal obstruction.  She understands and agrees to proceed.     Malachi Pro. Ambrose Mantle, M.D.     TFH/MEDQ  D:  10/29/2011  T:  10/29/2011  Job:  [id number]

## 2011-10-30 ENCOUNTER — Encounter (HOSPITAL_COMMUNITY): Payer: Self-pay | Admitting: *Deleted

## 2011-10-30 ENCOUNTER — Encounter (HOSPITAL_COMMUNITY)
Admission: RE | Disposition: A | Discharge status: Home / Self Care | Payer: Self-pay | Source: Ambulatory Visit | Attending: Obstetrics and Gynecology

## 2011-10-30 ENCOUNTER — Inpatient Hospital Stay (HOSPITAL_COMMUNITY): Payer: Medicaid Other | Admitting: Anesthesiology

## 2011-10-30 ENCOUNTER — Encounter (HOSPITAL_COMMUNITY): Payer: Self-pay | Admitting: Anesthesiology

## 2011-10-30 ENCOUNTER — Other Ambulatory Visit: Payer: Self-pay | Admitting: Obstetrics and Gynecology

## 2011-10-30 ENCOUNTER — Ambulatory Visit (HOSPITAL_COMMUNITY)
Admission: RE | Admit: 2011-10-30 | Discharge: 2011-10-31 | Disposition: A | Discharge status: Home / Self Care | Payer: Medicaid Other | Source: Ambulatory Visit | Attending: Obstetrics and Gynecology | Admitting: Obstetrics and Gynecology

## 2011-10-30 DIAGNOSIS — N7013 Chronic salpingitis and oophoritis: Secondary | ICD-10-CM | POA: Insufficient documentation

## 2011-10-30 DIAGNOSIS — Z90710 Acquired absence of both cervix and uterus: Secondary | ICD-10-CM

## 2011-10-30 DIAGNOSIS — N9489 Other specified conditions associated with female genital organs and menstrual cycle: Secondary | ICD-10-CM | POA: Insufficient documentation

## 2011-10-30 DIAGNOSIS — N946 Dysmenorrhea, unspecified: Secondary | ICD-10-CM | POA: Insufficient documentation

## 2011-10-30 DIAGNOSIS — N920 Excessive and frequent menstruation with regular cycle: Secondary | ICD-10-CM | POA: Insufficient documentation

## 2011-10-30 HISTORY — PX: ABDOMINAL HYSTERECTOMY: SHX81

## 2011-10-30 HISTORY — PX: SALPINGOOPHORECTOMY: SHX82

## 2011-10-30 LAB — URINALYSIS, ROUTINE W REFLEX MICROSCOPIC
Bilirubin Urine: NEGATIVE
Glucose, UA: NEGATIVE mg/dL
Ketones, ur: NEGATIVE mg/dL
Leukocytes, UA: NEGATIVE
Nitrite: NEGATIVE
Protein, ur: NEGATIVE mg/dL
Specific Gravity, Urine: >1.03 — ABNORMAL HIGH (ref 1.005–1.030)
Urobilinogen, UA: 0.2 mg/dL (ref 0.0–1.0)
pH: 5.5 (ref 5.0–8.0)

## 2011-10-30 LAB — GLUCOSE, CAPILLARY
Glucose-Capillary: 264 mg/dL — ABNORMAL HIGH (ref 70–99)
Glucose-Capillary: 271 mg/dL — ABNORMAL HIGH (ref 70–99)
Glucose-Capillary: 254 mg/dL — ABNORMAL HIGH (ref 70–99)

## 2011-10-30 SURGERY — HYSTERECTOMY, ABDOMINAL
Anesthesia: Monitor Anesthesia Care | Site: Abdomen | Laterality: Right | Wound class: Clean Contaminated

## 2011-10-30 MED ORDER — DIPHENHYDRAMINE HCL 50 MG/ML IJ SOLN: 12.5000 mg | Freq: Four times a day (QID) | INTRAMUSCULAR | Status: DC | PRN

## 2011-10-30 MED ORDER — METOPROLOL TARTRATE 12.5 MG HALF TABLET
12.5000 mg | ORAL_TABLET | Freq: Two times a day (BID) | ORAL | Status: DC
  Administered 2011-10-30 – 2011-10-31 (×2): 12.5 mg via ORAL
  Filled 2011-10-30 (×5): qty 1

## 2011-10-30 MED ORDER — PANTOPRAZOLE SODIUM 40 MG PO TBEC
40.0000 mg | DELAYED_RELEASE_TABLET | Freq: Once | ORAL | Status: AC
  Administered 2011-10-30: 40 mg via ORAL

## 2011-10-30 MED ORDER — METOCLOPRAMIDE HCL 5 MG/ML IJ SOLN
INTRAMUSCULAR | Status: AC
  Filled 2011-10-30: qty 2

## 2011-10-30 MED ORDER — HYDROCHLOROTHIAZIDE 25 MG PO TABS
25.0000 mg | ORAL_TABLET | Freq: Every day | ORAL | Status: DC
  Administered 2011-10-31: 25 mg via ORAL
  Filled 2011-10-30 (×2): qty 1

## 2011-10-30 MED ORDER — PROPOFOL 10 MG/ML IV EMUL
INTRAVENOUS | Status: DC | PRN
  Administered 2011-10-30: 120 ug/min via INTRAVENOUS

## 2011-10-30 MED ORDER — INSULIN ASPART 100 UNIT/ML ~~LOC~~ SOLN
3.0000 [IU] | SUBCUTANEOUS | Status: DC
  Administered 2011-10-30: 3 [IU] via SUBCUTANEOUS
  Filled 2011-10-30: qty 3

## 2011-10-30 MED ORDER — FENTANYL CITRATE 0.05 MG/ML IJ SOLN
INTRAMUSCULAR | Status: DC | PRN
  Administered 2011-10-30: 50 ug via INTRAVENOUS
  Administered 2011-10-30: 25 ug via INTRAVENOUS
  Administered 2011-10-30: 25 ug via INTRATHECAL

## 2011-10-30 MED ORDER — LACTATED RINGERS IV SOLN
INTRAVENOUS | Status: DC
  Administered 2011-10-30 – 2011-10-31 (×2): via INTRAVENOUS

## 2011-10-30 MED ORDER — PANTOPRAZOLE SODIUM 40 MG PO TBEC
DELAYED_RELEASE_TABLET | ORAL | Status: AC
  Administered 2011-10-30: 40 mg via ORAL
  Filled 2011-10-30: qty 1

## 2011-10-30 MED ORDER — DIPHENHYDRAMINE HCL 12.5 MG/5ML PO ELIX
12.5000 mg | ORAL_SOLUTION | Freq: Four times a day (QID) | ORAL | Status: DC | PRN
  Administered 2011-10-30: 12.5 mg via ORAL
  Filled 2011-10-30 (×2): qty 5

## 2011-10-30 MED ORDER — SODIUM CHLORIDE 0.9 % IJ SOLN: 9.0000 mL | INTRAMUSCULAR | Status: DC | PRN

## 2011-10-30 MED ORDER — HEPARIN SODIUM (PORCINE) 5000 UNIT/ML IJ SOLN
5000.0000 [IU] | Freq: Three times a day (TID) | INTRAMUSCULAR | Status: DC
  Administered 2011-10-30 – 2011-10-31 (×3): 5000 [IU] via SUBCUTANEOUS
  Filled 2011-10-30 (×6): qty 1

## 2011-10-30 MED ORDER — ONDANSETRON HCL 4 MG/2ML IJ SOLN
INTRAMUSCULAR | Status: DC | PRN
  Administered 2011-10-30: 4 mg via INTRAVENOUS

## 2011-10-30 MED ORDER — HEPARIN SODIUM (PORCINE) 5000 UNIT/ML IJ SOLN: 5000.0000 [IU] | Freq: Three times a day (TID) | INTRAMUSCULAR | Status: DC

## 2011-10-30 MED ORDER — ONDANSETRON HCL 4 MG/2ML IJ SOLN
INTRAMUSCULAR | Status: AC
  Administered 2011-10-30: 4 mg via INTRAVENOUS
  Filled 2011-10-30: qty 2

## 2011-10-30 MED ORDER — METOCLOPRAMIDE HCL 5 MG/ML IJ SOLN
10.0000 mg | Freq: Once | INTRAMUSCULAR | Status: AC | PRN
  Administered 2011-10-30: 10 mg via INTRAVENOUS

## 2011-10-30 MED ORDER — ONDANSETRON HCL 4 MG/2ML IJ SOLN
4.0000 mg | Freq: Three times a day (TID) | INTRAMUSCULAR | Status: DC | PRN
Start: 2011-10-30 — End: 2011-10-31
  Administered 2011-10-30: 4 mg via INTRAVENOUS

## 2011-10-30 MED ORDER — GLIPIZIDE 10 MG PO TABS
10.0000 mg | ORAL_TABLET | Freq: Every day | ORAL | Status: DC
  Administered 2011-10-30 – 2011-10-31 (×2): 10 mg via ORAL
  Filled 2011-10-30 (×3): qty 1

## 2011-10-30 MED ORDER — NALOXONE HCL 0.4 MG/ML IJ SOLN: 0.4000 mg | INTRAMUSCULAR | Status: DC | PRN

## 2011-10-30 MED ORDER — LISINOPRIL 20 MG PO TABS
20.0000 mg | ORAL_TABLET | Freq: Every day | ORAL | Status: DC
  Administered 2011-10-31: 20 mg via ORAL
  Filled 2011-10-30 (×2): qty 1

## 2011-10-30 MED ORDER — HEPARIN SODIUM (PORCINE) 5000 UNIT/ML IJ SOLN
5000.0000 [IU] | INTRAMUSCULAR | Status: AC
  Administered 2011-10-30: 5000 [IU] via SUBCUTANEOUS

## 2011-10-30 MED ORDER — DIPHENHYDRAMINE HCL 25 MG PO CAPS
25.0000 mg | ORAL_CAPSULE | ORAL | Status: DC | PRN
  Administered 2011-10-31 (×2): 25 mg via ORAL
  Filled 2011-10-30 (×2): qty 1

## 2011-10-30 MED ORDER — KETOROLAC TROMETHAMINE 30 MG/ML IJ SOLN: 30.0000 mg | Freq: Four times a day (QID) | INTRAMUSCULAR | Status: DC | PRN

## 2011-10-30 MED ORDER — SODIUM CHLORIDE 0.9 % IV SOLN: 1.0000 ug/kg/h | INTRAVENOUS | Status: DC | PRN

## 2011-10-30 MED ORDER — ONDANSETRON HCL 4 MG PO TABS: 4.0000 mg | ORAL_TABLET | Freq: Four times a day (QID) | ORAL | Status: DC | PRN

## 2011-10-30 MED ORDER — DIPHENHYDRAMINE HCL 50 MG/ML IJ SOLN: 12.5000 mg | INTRAMUSCULAR | Status: DC | PRN

## 2011-10-30 MED ORDER — DIPHENHYDRAMINE HCL 50 MG/ML IJ SOLN: 25.0000 mg | INTRAMUSCULAR | Status: DC | PRN

## 2011-10-30 MED ORDER — MORPHINE SULFATE (PF) 1 MG/ML IV SOLN
INTRAVENOUS | Status: DC
  Administered 2011-10-30: 15:00:00 via INTRAVENOUS
  Administered 2011-10-30: 5 mg via INTRAVENOUS
  Administered 2011-10-31: 2 mg via INTRAVENOUS
  Filled 2011-10-30: qty 25

## 2011-10-30 MED ORDER — DEXAMETHASONE SODIUM PHOSPHATE 10 MG/ML IJ SOLN
INTRAMUSCULAR | Status: DC | PRN
  Administered 2011-10-30: 10 mg via INTRAVENOUS

## 2011-10-30 MED ORDER — MEPERIDINE HCL 25 MG/ML IJ SOLN: 6.2500 mg | INTRAMUSCULAR | Status: DC | PRN

## 2011-10-30 MED ORDER — IBUPROFEN 600 MG PO TABS
600.0000 mg | ORAL_TABLET | Freq: Four times a day (QID) | ORAL | Status: DC | PRN
  Administered 2011-10-31: 600 mg via ORAL
  Filled 2011-10-30: qty 1

## 2011-10-30 MED ORDER — ONDANSETRON HCL 4 MG/2ML IJ SOLN: 4.0000 mg | Freq: Four times a day (QID) | INTRAMUSCULAR | Status: DC | PRN

## 2011-10-30 MED ORDER — INFLUENZA VIRUS VACC SPLIT PF IM SUSP
0.5000 mL | INTRAMUSCULAR | Status: AC
  Administered 2011-10-31: 0.5 mL via INTRAMUSCULAR
  Filled 2011-10-30 (×2): qty 0.5

## 2011-10-30 MED ORDER — FLUOXETINE HCL 20 MG PO CAPS
20.0000 mg | ORAL_CAPSULE | Freq: Every day | ORAL | Status: DC
  Administered 2011-10-30 – 2011-10-31 (×2): 20 mg via ORAL
  Filled 2011-10-30 (×3): qty 1

## 2011-10-30 MED ORDER — MIDAZOLAM HCL 5 MG/5ML IJ SOLN
INTRAMUSCULAR | Status: DC | PRN
  Administered 2011-10-30: 2 mg via INTRAVENOUS

## 2011-10-30 MED ORDER — LISINOPRIL-HYDROCHLOROTHIAZIDE 20-25 MG PO TABS: 1.0000 | ORAL_TABLET | Freq: Every day | ORAL | Status: DC

## 2011-10-30 MED ORDER — FENTANYL CITRATE 0.05 MG/ML IJ SOLN: 25.0000 ug | INTRAMUSCULAR | Status: DC | PRN

## 2011-10-30 MED ORDER — KETOROLAC TROMETHAMINE 60 MG/2ML IM SOLN: 60.0000 mg | Freq: Once | INTRAMUSCULAR | Status: DC | PRN

## 2011-10-30 MED ORDER — BUPIVACAINE IN DEXTROSE 0.75-8.25 % IT SOLN
INTRATHECAL | Status: DC | PRN
  Administered 2011-10-30: 2 mL via INTRATHECAL

## 2011-10-30 MED ORDER — SODIUM CHLORIDE 0.9 % IJ SOLN: 3.0000 mL | INTRAMUSCULAR | Status: DC | PRN

## 2011-10-30 MED ORDER — LIDOCAINE HCL (CARDIAC) 20 MG/ML IV SOLN
INTRAVENOUS | Status: DC | PRN
  Administered 2011-10-30: 40 mg via INTRAVENOUS

## 2011-10-30 MED ORDER — LACTATED RINGERS IV SOLN
INTRAVENOUS | Status: DC
  Administered 2011-10-30: 125 mL/h via INTRAVENOUS
  Administered 2011-10-30: 08:00:00 via INTRAVENOUS
  Administered 2011-10-30: 125 mL/h via INTRAVENOUS
  Administered 2011-10-30: 09:00:00 via INTRAVENOUS

## 2011-10-30 MED ORDER — DOXEPIN HCL 25 MG PO CAPS
25.0000 mg | ORAL_CAPSULE | Freq: Every day | ORAL | Status: DC
  Filled 2011-10-30 (×2): qty 1

## 2011-10-30 MED ORDER — MORPHINE SULFATE (PF) 0.5 MG/ML IJ SOLN
INTRAMUSCULAR | Status: DC | PRN
  Administered 2011-10-30: .2 mg via INTRATHECAL
  Administered 2011-10-30: 2.8 mg via EPIDURAL
  Administered 2011-10-30: 2 mg via INTRAVENOUS

## 2011-10-30 MED ORDER — METFORMIN HCL 500 MG PO TABS
500.0000 mg | ORAL_TABLET | Freq: Two times a day (BID) | ORAL | Status: DC
  Administered 2011-10-30 – 2011-10-31 (×2): 500 mg via ORAL
  Filled 2011-10-30 (×4): qty 1

## 2011-10-30 MED ORDER — CEFAZOLIN SODIUM 1-5 GM-% IV SOLN
1.0000 g | Freq: Three times a day (TID) | INTRAVENOUS | Status: AC
  Administered 2011-10-30 (×2): 1 g via INTRAVENOUS
  Filled 2011-10-30 (×2): qty 50

## 2011-10-30 MED ORDER — PANTOPRAZOLE SODIUM 40 MG PO TBEC
40.0000 mg | DELAYED_RELEASE_TABLET | Freq: Every day | ORAL | Status: DC
  Filled 2011-10-30 (×3): qty 1

## 2011-10-30 MED ORDER — PROMETHAZINE HCL 25 MG/ML IJ SOLN
6.2500 mg | INTRAMUSCULAR | Status: DC | PRN
  Administered 2011-10-30: 12.5 mg via INTRAVENOUS

## 2011-10-30 MED ORDER — PHENYLEPHRINE HCL 10 MG/ML IJ SOLN
INTRAMUSCULAR | Status: DC | PRN
  Administered 2011-10-30: 40 ug via INTRAVENOUS

## 2011-10-30 MED ORDER — PROMETHAZINE HCL 25 MG/ML IJ SOLN
INTRAMUSCULAR | Status: AC
  Administered 2011-10-30: 12.5 mg via INTRAVENOUS
  Filled 2011-10-30: qty 1

## 2011-10-30 SURGICAL SUPPLY — 31 items
CANISTER SUCTION 2500CC (MISCELLANEOUS) ×4 IMPLANT
CLOTH BEACON ORANGE TIMEOUT ST (SAFETY) ×4 IMPLANT
CONT PATH 16OZ SNAP LID 3702 (MISCELLANEOUS) ×4 IMPLANT
DECANTER SPIKE VIAL GLASS SM (MISCELLANEOUS) IMPLANT
DRSG VASELINE 3X18 (GAUZE/BANDAGES/DRESSINGS) ×4 IMPLANT
GAUZE PETROLATUM 7835 (GAUZE/BANDAGES/DRESSINGS) ×1 IMPLANT
GAUZE SPONGE 4X4 12PLY STRL LF (GAUZE/BANDAGES/DRESSINGS) ×4 IMPLANT
GLOVE BIO SURGEON STRL SZ7.5 (GLOVE) ×8 IMPLANT
GOWN PREVENTION PLUS LG XLONG (DISPOSABLE) ×8 IMPLANT
GOWN PREVENTION PLUS XLARGE (GOWN DISPOSABLE) ×5 IMPLANT
NS IRRIG 1000ML POUR BTL (IV SOLUTION) ×4 IMPLANT
PACK ABDOMINAL GYN (CUSTOM PROCEDURE TRAY) ×4 IMPLANT
PAD OB MATERNITY 4.3X12.25 (PERSONAL CARE ITEMS) ×4 IMPLANT
RETRACTOR WND ALEXIS 25 LRG (MISCELLANEOUS) IMPLANT
RTRCTR WOUND ALEXIS 25CM LRG (MISCELLANEOUS)
SPONGE LAP 18X18 X RAY DECT (DISPOSABLE) ×7 IMPLANT
SPONGE LAP 4X18 X RAY DECT (DISPOSABLE) ×4 IMPLANT
STAPLER VISISTAT 35W (STAPLE) ×4 IMPLANT
SUT PDS AB 0 CTX 60 (SUTURE) IMPLANT
SUT VIC AB 0 CT1 18XCR BRD8 (SUTURE) ×9 IMPLANT
SUT VIC AB 0 CT1 27 (SUTURE) ×12
SUT VIC AB 0 CT1 27XBRD ANBCTR (SUTURE) ×9 IMPLANT
SUT VIC AB 0 CT1 8-18 (SUTURE) ×12
SUT VIC AB 3-0 CTX 36 (SUTURE) ×4 IMPLANT
SUT VIC AB 3-0 SH 27 (SUTURE) ×8
SUT VIC AB 3-0 SH 27X BRD (SUTURE) IMPLANT
SUT VICRYL 0 TIES 12 18 (SUTURE) ×4 IMPLANT
TAPE CLOTH SURG 4X10 WHT LF (GAUZE/BANDAGES/DRESSINGS) ×1 IMPLANT
TOWEL OR 17X24 6PK STRL BLUE (TOWEL DISPOSABLE) ×8 IMPLANT
TRAY FOLEY CATH 14FR (SET/KITS/TRAYS/PACK) ×4 IMPLANT
WATER STERILE IRR 1000ML POUR (IV SOLUTION) ×3 IMPLANT

## 2011-10-30 NOTE — Progress Notes (Signed)
Patient ID: Vanessa Case, female   DOB: Jun 09, 1971, 41 y.o.   MRN: [id number] I examined this pt on 10-29-11 and she states there has been no major change in her health since that time.

## 2011-10-30 NOTE — Anesthesia Procedure Notes (Signed)
Spinal  Patient location during procedure: OR Start time: 10/30/2011 8:43 AM End time: 10/30/2011 8:48 AM Staffing Anesthesiologist: Sandrea Hughs Performed by: anesthesiologist  Preanesthetic Checklist Completed: patient identified, site marked, surgical consent, pre-op evaluation, timeout performed, IV checked, risks and benefits discussed and monitors and equipment checked Spinal Block Patient position: sitting Prep: DuraPrep Patient monitoring: heart rate, cardiac monitor, continuous pulse ox and blood pressure Approach: midline Location: L3-4 Injection technique: single-shot Needle Needle type: Sprotte  Needle gauge: 24 G Needle length: 9 cm Needle insertion depth: 6 cm Assessment Sensory level: T4

## 2011-10-30 NOTE — Progress Notes (Signed)
Day of Surgery Procedure(s): HYSTERECTOMY ABDOMINAL SALPINGO OOPHERECTOMY UNILATERAL SALPINGECTOMY  Subjective: Patient reports tolerating PO.  She looks very alert and comfortable.  Ambulating well.  Objective: I have reviewed patient's vital signs and intake and output.  General: alert Cardio: regular rate and rhythm GI: soft and NT  Assessment: s/p Procedure(s): HYSTERECTOMY ABDOMINAL SALPINGO OOPHERECTOMY UNILATERAL SALPINGECTOMY: stable  Plan: BS 204-281 most recently.  Has some sliding scale coverage ordered. Will change accuchecks to fasting and after meals now that she is tolerating po.  LOS: 0 days    Ritamarie Arkin W 10/30/2011, 6:00 PM

## 2011-10-30 NOTE — Transfer of Care (Signed)
Immediate Anesthesia Transfer of Care Note  Patient: Vanessa Case  Procedure(s) Performed:  HYSTERECTOMY ABDOMINAL; SALPINGO OOPHERECTOMY; UNILATERAL SALPINGECTOMY  Patient Location: PACU  Anesthesia Type: Spinal  Level of Consciousness: awake, alert  and oriented  Airway & Oxygen Therapy: Patient connected to nasal cannula oxygen  Post-op Assessment: Report given to PACU RN  Post vital signs: Reviewed and stable  Complications: No apparent anesthesia complications

## 2011-10-30 NOTE — Anesthesia Postprocedure Evaluation (Signed)
Anesthesia Post Note  Patient: Vanessa Case  Procedure(s) Performed:  HYSTERECTOMY ABDOMINAL; SALPINGO OOPHERECTOMY; UNILATERAL SALPINGECTOMY  Anesthesia type: Spinal  Patient location: PACU  Post pain: Pain level controlled  Post assessment: Post-op Vital signs reviewed  Last Vitals:  Filed Vitals:   10/30/11 1215  BP: 128/61  Pulse: 80  Temp:   Resp: 16    Post vital signs: Reviewed  Level of consciousness: awake  Complications: No apparent anesthesia complications

## 2011-10-30 NOTE — Anesthesia Preprocedure Evaluation (Addendum)
Anesthesia Evaluation  Patient identified by MRN, date of birth, ID band Patient awake    Reviewed: Allergy & Precautions, H&P , NPO status , Patient's Chart, lab work & pertinent test results, reviewed documented beta blocker date and time   History of Anesthesia Complications Negative for: history of anesthetic complications  Airway Mallampati: III TM Distance: >3 FB Neck ROM: full    Dental  (+) Teeth Intact,    Pulmonary neg pulmonary ROS,  clear to auscultation  Pulmonary exam normal       Cardiovascular Exercise Tolerance: Good hypertension, On Home Beta Blockers regular Normal    Neuro/Psych PSYCHIATRIC DISORDERS (bipolar d/o) Negative Neurological ROS     GI/Hepatic negative GI ROS, Neg liver ROS, GERD- (takes protonix twice a day - ran out of meds)  Medicated,  Endo/Other  Diabetes mellitus-, Type 2, Oral Hypoglycemic AgentsMorbid obesity  Renal/GU negative Renal ROS  Genitourinary negative   Musculoskeletal   Abdominal   Peds  Hematology negative hematology ROS (+)   Anesthesia Other Findings   Reproductive/Obstetrics negative OB ROS                           Anesthesia Physical Anesthesia Plan  ASA: III  Anesthesia Plan: MAC and Combined Spinal and Epidural   Post-op Pain Management:    Induction:   Airway Management Planned:   Additional Equipment:   Intra-op Plan:   Post-operative Plan:   Informed Consent: I have reviewed the patients History and Physical, chart, labs and discussed the procedure including the risks, benefits and alternatives for the proposed anesthesia with the patient or authorized representative who has indicated his/her understanding and acceptance.   Dental Advisory Given  Plan Discussed with: CRNA and Surgeon  Anesthesia Plan Comments:       Anesthesia Quick Evaluation

## 2011-10-31 ENCOUNTER — Encounter (HOSPITAL_COMMUNITY): Payer: Self-pay | Admitting: Obstetrics and Gynecology

## 2011-10-31 LAB — CBC
HCT: 27.7 % — ABNORMAL LOW (ref 36.0–46.0)
Hemoglobin: 8.9 g/dL — ABNORMAL LOW (ref 12.0–15.0)
MCV: 79.6 fL (ref 78.0–100.0)
RBC: 3.48 MIL/uL — ABNORMAL LOW (ref 3.87–5.11)
WBC: 12.8 10*3/uL — ABNORMAL HIGH (ref 4.0–10.5)

## 2011-10-31 LAB — GLUCOSE, CAPILLARY
Glucose-Capillary: 165 mg/dL — ABNORMAL HIGH (ref 70–99)
Glucose-Capillary: 239 mg/dL — ABNORMAL HIGH (ref 70–99)
Glucose-Capillary: 244 mg/dL — ABNORMAL HIGH (ref 70–99)

## 2011-10-31 MED ORDER — DIPHENHYDRAMINE HCL 12.5 MG/5ML PO ELIX: 12.5000 mg | ORAL_SOLUTION | Freq: Four times a day (QID) | ORAL | Status: DC | PRN

## 2011-10-31 MED ORDER — OXYCODONE-ACETAMINOPHEN 5-325 MG PO TABS
1.0000 | ORAL_TABLET | Freq: Four times a day (QID) | ORAL | Status: DC | PRN
  Administered 2011-10-31: 1 via ORAL
  Filled 2011-10-31: qty 1

## 2011-10-31 MED ORDER — NALOXONE HCL 0.4 MG/ML IJ SOLN: 0.4000 mg | INTRAMUSCULAR | Status: DC | PRN

## 2011-10-31 MED ORDER — SODIUM CHLORIDE 0.9 % IJ SOLN: 9.0000 mL | INTRAMUSCULAR | Status: DC | PRN

## 2011-10-31 MED ORDER — DIPHENHYDRAMINE HCL 50 MG/ML IJ SOLN: 12.5000 mg | Freq: Four times a day (QID) | INTRAMUSCULAR | Status: DC | PRN

## 2011-10-31 MED ORDER — ONDANSETRON HCL 4 MG/2ML IJ SOLN: 4.0000 mg | Freq: Four times a day (QID) | INTRAMUSCULAR | Status: DC | PRN

## 2011-10-31 MED ORDER — MORPHINE SULFATE (PF) 1 MG/ML IV SOLN
INTRAVENOUS | Status: DC
  Administered 2011-10-31: 3 mg via INTRAVENOUS

## 2011-10-31 MED ORDER — IBUPROFEN 600 MG PO TABS: 600.0000 mg | ORAL_TABLET | Freq: Four times a day (QID) | ORAL | Status: AC | PRN

## 2011-10-31 MED ORDER — OXYCODONE-ACETAMINOPHEN 5-325 MG PO TABS: 1.0000 | ORAL_TABLET | Freq: Four times a day (QID) | ORAL | Status: AC | PRN

## 2011-10-31 NOTE — Progress Notes (Signed)
Patient ID: Vanessa Case, female   DOB: 1971-06-29, 41 y.o.   MRN: [id number] 31 afebrile BP normal BS 160-270 HGB stable Will reassess later today for D/C

## 2011-10-31 NOTE — Addendum Note (Signed)
Addendum  created 10/31/11 0956 by Madison Hickman   Modules edited:Notes Section

## 2011-10-31 NOTE — Anesthesia Postprocedure Evaluation (Signed)
  Anesthesia Post-op Note  Patient: Vanessa Case  Procedure(s) Performed:  HYSTERECTOMY ABDOMINAL; SALPINGO OOPHERECTOMY; UNILATERAL SALPINGECTOMY  Patient Location: [facility]  Anesthesia Type: General  Level of Consciousness: awake, alert  and oriented  Airway and Oxygen Therapy: Patient Spontanous Breathing  Post-op Pain: none  Post-op Assessment: Post-op Vital signs reviewed and Patient's Cardiovascular Status Stable  Post-op Vital Signs: Reviewed and stable  Complications: No apparent anesthesia complications

## 2011-10-31 NOTE — Op Note (Signed)
NAMEKYLEEANN, CREMEANS              ACCOUNT NO.:  [id number]  MEDICAL RECORD NO.:  [id number]  LOCATION:  9319                          FACILITY:  [facility]  PHYSICIAN:  Malachi Pro. Ambrose Mantle, M.D. DATE OF BIRTH:  Jan 01, 1971  DATE OF PROCEDURE:  10/30/2011 DATE OF DISCHARGE:                              OPERATIVE REPORT   PREOPERATIVE DIAGNOSIS:  Severe dysmenorrhea, menorrhagia, and probable adenomyosis persistent complex cyst in the right adnexa.  POSTOPERATIVE DIAGNOSIS:  Probable hematosalpinx in the right tube because it spilled chocolate-appearing fluid and it was entered.  OPERATION:  Abdominal hysterectomy, right salpingo-oophorectomy, and removal of the proximal portion of the left fallopian tube.  OPERATOR:  Malachi Pro. Ambrose Mantle, MD  ASSISTANT:  Zenaida Niece, MD  ANESTHESIA:  Spinal anesthesia, by Quillian Quince, MD  The patient was brought to the operating room, Dr. Rodman Pickle, had interviewed the patient and the patient requested spinal anesthesia. The patient was positioned on the table and Dr. Arby Barrette, gave a spinal anesthetic.  After anesthesia was done, the patient was placed supine. A time-out was done.  The abdomen was prepped with Betadine solution. The vagina was prepped.  The urethra was prepped and a Foley catheter was inserted to straight drain.  The abdomen was then draped as a sterile field.  Anesthesia was confirmed by pinching the lower abdomen with an Allis clamp.  She had 2 transverse lower abdominal incision, so I made an incision through the superior transverse incision and carried it through a very thick subcutaneous tissue down to the fascia, incised the fascia transversely, separated it with some difficulty from the rectus muscle inferiorly and superiorly, and ligated blood vessels along the way.  I then entered the peritoneal cavity, and had difficulty getting a long midline extension in the peritoneum, because the bladder was closed inferiorly and I  could not separate the fascia any farther superiorly.  On entering the abdominal cavity, I realized that the complex cyst in the right adnexa was a hematosalpinx.  The right ovary had multiple superficial cyst on it and I felt like the right tube and ovary should be removed, the left tube and ovary appeared relatively normal except for the prior tubal ligation on the left side.  Packs and retractors were used to try to create an operative field, although the operative field was quite compromised.  I clamped across both upper pedicles, did a right salpingo oophorectomy by ligating the infundibulopelvic ligament.  The proximal portion of the left fallopian tube was removed along with the uterus.  I progressively clamped, cut, and suture ligated along the sidewalls of the uterus.  The bladder was somewhat adherent to the anterior lower uterine segment, but I was able to push it free off the operative field and after once got the proper plane, it moved inferiorly.  The patient did not have well-developed uterosacral on the left side, so I was unable to save the uterosacral on the left side.  I entered both vaginal angles and then removed the uterus by transecting the upper vagina, vaginal angle sutures were placed.  The central portion of the vagina was closed with interrupted figure-of-eight sutures of 0 Vicryl.  Liberal irrigation  confirmed that we did not have complete hemostasis, and I used 0 and 3-0 Vicryl sutures to try to obtain complete hemostasis, especially along the right vaginal angle and on the tissue close to the bladder anteriorly.  At the conclusion, hemostasis was adequate.  I palpated both ureters well free of the operative field, removed all retractors and sponges and closed the abdominal wall with interrupted sutures of 0 Vicryl on the rectus muscle including the peritoneum, 2 running sutures of 0 Vicryl on the fascia, running 3-0 Vicryl on the subcu tissue and staples on  the skin. The patient seemed to tolerate the procedure well.  Blood loss was estimated 400 mL.  Sponge and needle counts were correct and she was returned to recovery in satisfactory condition.     Malachi Pro. Ambrose Mantle, M.D.     TFH/MEDQ  D:  10/30/2011  T:  10/31/2011  Job:  [id number]

## 2011-10-31 NOTE — Progress Notes (Signed)
Pt has ambulated twice since surgery and requested to have her foley removed sooner than 0500.  Removed foley and pt tolerated well.  Will continue monitoring post foley voids.

## 2011-10-31 NOTE — Progress Notes (Signed)
Pt d/c home ambulatory with family via personal car. D/c instructions and prescriptions reviewed pt verbalized understanding.

## 2011-10-31 NOTE — Progress Notes (Signed)
Patient ID: Vanessa Case, female   DOB: December 14, 1970, 41 y.o.   MRN: [id number] Pt is stable and ready for D/C

## 2011-11-01 NOTE — Discharge Summary (Signed)
Vanessa Case, RIGGENBACH              ACCOUNT NO.:  [id number]  MEDICAL RECORD NO.:  [id number]  LOCATION:  9319                          FACILITY:  [facility]  PHYSICIAN:  Malachi Pro. Ambrose Mantle, M.D. DATE OF BIRTH:  [birth year]  DATE OF ADMISSION:  10/30/2011 DATE OF DISCHARGE:  10/31/2011                              DISCHARGE SUMMARY   This is a 41 year old black female who is admitted for abdominal hysterectomy, possible right salpingo-oophorectomy, possible bilateral salpingo-oophorectomy because of severe dysmenorrhea and menorrhagia, suspected adenomyosis and persistent right adnexal mass, thought to possibly be an ovarian neoplasm.  The patient underwent laparotomy and at the time of surgery was noted to have a very large right hematocrit salpinx.  The right ovary had some subcapsular cyst and I removed the right tube and ovary, but the pathology seen on ultrasounds prior to surgery instead of coming from the ovary, was actually the fallopian tube, then when it spilled during surgery spilled brownish fluid. Postoperatively, the patient did quite well.  She did have followup of her blood sugars and they were significantly abnormal, 204 prior to surgery, 264 at 1225, 281 at 1648 for which she got 3 units of insulin, 271 at 0816 p.m., 254 at 1004 p.m., 239 at 1215 a.m., 244 at 1224 a.m., 166 at 0758 a.m., and 165 at 1206 p.m.  The patient did very well.  She ambulated well without difficulty, tolerated a diet, was voiding well, and was ready for discharge.  Initial hemoglobin was 10+ and postoperatively her hemoglobin was 8.9, hematocrit 27.7, white count 12,800, platelet count 270,000.  Her path report is not available.  She is tolerating a diet.  She is able to ambulate well.  She is voiding well and is ready for discharge.  DISCHARGE MEDICATIONS:  She is to continue all of her home medications plus Motrin 600 mg, #30 tablets, 1 every 6 hours as needed for pain and Percocet 5/325, #30  tablets 1 every 6 hours as needed for pain.  She is to return to the office in 5 days for followup examination.  OPERATIONS IN THE HOSPITAL:  Abdominal hysterectomy, right salpingo- oophorectomy, and proximal segment of the left fallopian tube.     Malachi Pro. Ambrose Mantle, M.D.     TFH/MEDQ  D:  10/31/2011  T:  11/01/2011  Job:  [id number]

## 2011-11-12 ENCOUNTER — Other Ambulatory Visit (HOSPITAL_COMMUNITY): Payer: Self-pay | Admitting: Obstetrics and Gynecology

## 2011-11-12 DIAGNOSIS — Z90711 Acquired absence of uterus with remaining cervical stump: Secondary | ICD-10-CM

## 2011-11-13 ENCOUNTER — Ambulatory Visit (HOSPITAL_COMMUNITY)
Admission: RE | Admit: 2011-11-13 | Discharge: 2011-11-13 | Disposition: A | Discharge status: Home / Self Care | Payer: Medicaid Other | Source: Ambulatory Visit | Attending: Obstetrics and Gynecology | Admitting: Obstetrics and Gynecology

## 2011-11-13 DIAGNOSIS — R188 Other ascites: Secondary | ICD-10-CM | POA: Insufficient documentation

## 2011-11-13 DIAGNOSIS — R109 Unspecified abdominal pain: Secondary | ICD-10-CM | POA: Insufficient documentation

## 2011-11-13 DIAGNOSIS — Z90710 Acquired absence of both cervix and uterus: Secondary | ICD-10-CM | POA: Insufficient documentation

## 2011-11-13 DIAGNOSIS — K7689 Other specified diseases of liver: Secondary | ICD-10-CM | POA: Insufficient documentation

## 2011-11-13 DIAGNOSIS — Z90711 Acquired absence of uterus with remaining cervical stump: Secondary | ICD-10-CM

## 2011-11-13 DIAGNOSIS — K8020 Calculus of gallbladder without cholecystitis without obstruction: Secondary | ICD-10-CM | POA: Insufficient documentation

## 2011-11-13 MED ORDER — IOHEXOL 300 MG/ML  SOLN
100.0000 mL | Freq: Once | INTRAMUSCULAR | Status: AC | PRN
  Administered 2011-11-13: 100 mL via INTRAVENOUS

## 2012-04-24 ENCOUNTER — Encounter (HOSPITAL_COMMUNITY): Payer: Self-pay | Admitting: Emergency Medicine

## 2012-04-24 ENCOUNTER — Observation Stay (HOSPITAL_COMMUNITY)
Admission: EM | Admit: 2012-04-24 | Discharge: 2012-04-25 | Disposition: A | Discharge status: Home Health | Payer: Medicaid Other | Attending: Emergency Medicine | Admitting: Emergency Medicine

## 2012-04-24 ENCOUNTER — Other Ambulatory Visit: Payer: Self-pay

## 2012-04-24 DIAGNOSIS — E119 Type 2 diabetes mellitus without complications: Secondary | ICD-10-CM | POA: Insufficient documentation

## 2012-04-24 DIAGNOSIS — R112 Nausea with vomiting, unspecified: Secondary | ICD-10-CM | POA: Insufficient documentation

## 2012-04-24 DIAGNOSIS — R42 Dizziness and giddiness: Secondary | ICD-10-CM

## 2012-04-24 DIAGNOSIS — R079 Chest pain, unspecified: Principal | ICD-10-CM | POA: Insufficient documentation

## 2012-04-24 DIAGNOSIS — F411 Generalized anxiety disorder: Secondary | ICD-10-CM | POA: Insufficient documentation

## 2012-04-24 DIAGNOSIS — K219 Gastro-esophageal reflux disease without esophagitis: Secondary | ICD-10-CM | POA: Insufficient documentation

## 2012-04-24 DIAGNOSIS — E860 Dehydration: Secondary | ICD-10-CM | POA: Insufficient documentation

## 2012-04-24 DIAGNOSIS — I10 Essential (primary) hypertension: Secondary | ICD-10-CM | POA: Insufficient documentation

## 2012-04-24 NOTE — ED Notes (Signed)
Reports nausea, vomiting, and dizziness since Sunday.  States vomiting stopped on Wednesday.  Reports epigastric pain since Thursday morning.

## 2012-04-25 ENCOUNTER — Observation Stay (HOSPITAL_COMMUNITY): Payer: Medicaid Other

## 2012-04-25 LAB — URINALYSIS, ROUTINE W REFLEX MICROSCOPIC
Hgb urine dipstick: NEGATIVE
Ketones, ur: 15 mg/dL — AB
Protein, ur: NEGATIVE mg/dL
Urobilinogen, UA: 1 mg/dL (ref 0.0–1.0)

## 2012-04-25 LAB — URINE MICROSCOPIC-ADD ON

## 2012-04-25 LAB — POCT I-STAT TROPONIN I: Troponin i, poc: 0 ng/mL (ref 0.00–0.08)

## 2012-04-25 LAB — BASIC METABOLIC PANEL
Calcium: 9.7 mg/dL (ref 8.4–10.5)
Chloride: 98 mEq/L (ref 96–112)
Creatinine, Ser: 0.64 mg/dL (ref 0.50–1.10)
GFR calc Af Amer: >90 mL/min (ref >90)

## 2012-04-25 LAB — CBC
MCV: 76 fL — ABNORMAL LOW (ref 78.0–100.0)
Platelets: 317 10*3/uL (ref 150–400)
RDW: 15.9 % — ABNORMAL HIGH (ref 11.5–15.5)
WBC: 9.1 10*3/uL (ref 4.0–10.5)

## 2012-04-25 MED ORDER — ONDANSETRON HCL 4 MG/2ML IJ SOLN
4.0000 mg | Freq: Once | INTRAMUSCULAR | Status: AC
  Administered 2012-04-25: 4 mg via INTRAVENOUS

## 2012-04-25 MED ORDER — MECLIZINE HCL 25 MG PO TABS
25.0000 mg | ORAL_TABLET | Freq: Three times a day (TID) | ORAL | Status: DC
  Administered 2012-04-25: 25 mg via ORAL
  Filled 2012-04-25: qty 1

## 2012-04-25 MED ORDER — ONDANSETRON HCL 4 MG/2ML IJ SOLN
INTRAMUSCULAR | Status: AC
  Administered 2012-04-25: 4 mg via INTRAVENOUS
  Filled 2012-04-25: qty 2

## 2012-04-25 MED ORDER — MECLIZINE HCL 25 MG PO TABS
25.0000 mg | ORAL_TABLET | Freq: Once | ORAL | Status: AC
  Administered 2012-04-25: 25 mg via ORAL
  Filled 2012-04-25: qty 1

## 2012-04-25 MED ORDER — SODIUM CHLORIDE 0.9 % IV BOLUS (SEPSIS)
1000.0000 mL | Freq: Once | INTRAVENOUS | Status: AC
  Administered 2012-04-25: 1000 mL via INTRAVENOUS

## 2012-04-25 MED ORDER — LORAZEPAM 1 MG PO TABS
1.0000 mg | ORAL_TABLET | Freq: Once | ORAL | Status: AC
  Administered 2012-04-25: 1 mg via ORAL
  Filled 2012-04-25: qty 1

## 2012-04-25 MED ORDER — ASPIRIN 81 MG PO CHEW
324.0000 mg | CHEWABLE_TABLET | Freq: Once | ORAL | Status: AC
  Administered 2012-04-25: 324 mg via ORAL
  Filled 2012-04-25: qty 4

## 2012-04-25 MED ORDER — MECLIZINE HCL 25 MG PO TABS: 25.0000 mg | ORAL_TABLET | Freq: Three times a day (TID) | ORAL | Status: AC | PRN

## 2012-04-25 MED ORDER — GADOBENATE DIMEGLUMINE 529 MG/ML IV SOLN
20.0000 mL | Freq: Once | INTRAVENOUS | Status: AC | PRN
  Administered 2012-04-25: 20 mL via INTRAVENOUS

## 2012-04-25 MED ORDER — LORAZEPAM 2 MG/ML IJ SOLN
0.5000 mg | Freq: Once | INTRAMUSCULAR | Status: AC
  Administered 2012-04-25: 0.5 mg via INTRAVENOUS
  Filled 2012-04-25: qty 1

## 2012-04-25 MED ORDER — GI COCKTAIL ~~LOC~~
30.0000 mL | Freq: Once | ORAL | Status: AC
  Administered 2012-04-25: 30 mL via ORAL
  Filled 2012-04-25: qty 30

## 2012-04-25 NOTE — ED Notes (Signed)
Attempted to ambulate patient she was assisted approximately 10 feet and then she complained of severe dizziness and feeling lightheaded. She also now complains of a throbbing pain in the right temporal area. She rate this pain a "4" and she advised that she did not have this pain until she got up.

## 2012-04-25 NOTE — ED Notes (Signed)
Patient to  X-ray for MRI.

## 2012-04-25 NOTE — ED Notes (Signed)
Patient is being moved to CDU.

## 2012-04-25 NOTE — ED Provider Notes (Signed)
History     CSN: [id number]  Arrival date & time 04/24/12  2309   First MD Initiated Contact with Patient 04/25/12 0041      Chief Complaint  Patient presents with  . Chest Pain    (Consider location/radiation/quality/duration/timing/severity/associated sxs/prior treatment) HPI 41 year old female presents to emergency department from home with complaint of chest pressure and dizziness. Patient describes dizziness as the room spinning around her. Patient reports she started with nausea and vomiting this past Sunday, 5 days ago. Nausea and vomiting stopped on Wednesday. Since that time she has had burning and pressure in her chest. Patient has history of reflux, and has been taking chewable antacids and Alka-Seltzer along with her Prilosec without relief of symptoms. Starting on Thursday, yesterday, patient reports the dizziness got much worse. Patient reports that she stays still the dizziness slowly resolves, but if she moves her head or tries to walk around the dizziness returns. She denies any focal weakness or numbness, no difficulties with eating swallowing talking or performing thoughts. Patient has history of hypertension, diabetes anxiety. No recent URI or viral type symptoms no fevers or chills. Patient reports sometimes she has chest tightness when she takes a deep breath.     Past Medical History  Diagnosis Date  . Anemia   . GERD (gastroesophageal reflux disease)   . Depression   . Anxiety   . 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 Hypertension     maintained on lisinopril hct, metoprolol-134/86 at pat visit    Past Surgical History  Procedure Date  . Cesarean section x3  . Abdominal hysterectomy 10/30/2011    Procedure: HYSTERECTOMY ABDOMINAL;  Surgeon: Bing Plume, MD;  Location: [facility];  Service: Gynecology;  Laterality: N/A;  . Salpingoophorectomy 10/30/2011    Procedure: SALPINGO  OOPHERECTOMY;  Surgeon: Bing Plume, MD;  Location: [facility];  Service: Gynecology;  Laterality: Right;    No family history on file.  History  Substance Use Topics  . Smoking status: Former Smoker    Quit date: 10/24/1997  . Smokeless tobacco: Not on file  . Alcohol Use: No    OB History    Grav Para Term Preterm Abortions TAB SAB Ect Mult Living                  Review of Systems  All other systems reviewed and are negative.    Allergies  Codeine  Home Medications   Current Outpatient Rx  Name Route Sig Dispense Refill  . ASPIRIN EFFERVESCENT 325 MG PO TBEF Oral Take 325 mg by mouth every 6 (six) hours.    Marland Kitchen CALCIUM CARBONATE ANTACID 1177 MG PO CHEW Oral Chew 3 tablets by mouth 2 times daily at 12 noon and 4 pm.    . CRANBERRY PO Oral Take 1 tablet by mouth daily. 3600mg     . GABAPENTIN 300 MG PO CAPS Oral Take 300 mg by mouth 2 (two) times daily.    Marland Kitchen GLIPIZIDE 10 MG PO TABS Oral Take 10 mg by mouth daily after breakfast.      . HYDROXYZINE HCL 50 MG PO TABS Oral Take 50 mg by mouth 2 (two) times daily.    Marland Kitchen LISINOPRIL-HYDROCHLOROTHIAZIDE 20-12.5 MG PO TABS Oral Take 1 tablet by mouth daily.    Marland Kitchen METFORMIN HCL 850 MG PO TABS Oral Take 850 mg by mouth 2 (two) times daily with a meal.    .  METOPROLOL TARTRATE 25 MG PO TABS Oral Take 25 mg by mouth 2 (two) times daily.     . ADULT MULTIVITAMIN W/MINERALS CH Oral Take 1 tablet by mouth daily.    Marland Kitchen OMEPRAZOLE 20 MG PO CPDR Oral Take 20 mg by mouth daily.    . ORPHENADRINE CITRATE ER 100 MG PO TB12 Oral Take 100 mg by mouth 2 (two) times daily.    Marland Kitchen PRAVASTATIN SODIUM 20 MG PO TABS Oral Take 20 mg by mouth daily.    . SERTRALINE HCL 25 MG PO TABS Oral Take 25 mg by mouth daily.    . TRAMADOL HCL 50 MG PO TABS Oral Take 50 mg by mouth every 6 (six) hours as needed. For pain      BP 141/80  Pulse 87  Temp 98.1 F (36.7 C) (Oral)  Resp 23  SpO2 100%  LMP 10/07/2011  Physical Exam  Nursing note and vitals  reviewed. Constitutional: She is oriented to person, place, and time. She appears well-developed and well-nourished. She appears distressed (uncomfortable appearing).  HENT:  Head: Normocephalic and atraumatic.  Right Ear: External ear normal.  Left Ear: External ear normal.  Nose: Nose normal.  Mouth/Throat: Oropharynx is clear and moist. No oropharyngeal exudate.  Eyes: Conjunctivae and EOM are normal. Pupils are equal, round, and reactive to light. Right eye exhibits no discharge. Left eye exhibits no discharge.  Neck: Normal range of motion. Neck supple. No JVD present. No tracheal deviation present. No thyromegaly present.  Cardiovascular: Normal rate, regular rhythm, normal heart sounds and intact distal pulses.  Exam reveals no gallop and no friction rub.   No murmur heard. Pulmonary/Chest: Effort normal and breath sounds normal. No stridor. No respiratory distress. She has no wheezes. She has no rales. She exhibits no tenderness.  Abdominal: Soft. Bowel sounds are normal. She exhibits no distension and no mass. There is no tenderness. There is no rebound and no guarding.  Lymphadenopathy:    She has no cervical adenopathy.  Neurological: She is alert and oriented to person, place, and time. She has normal reflexes. She displays normal reflexes. No cranial nerve deficit. She exhibits normal muscle tone. Coordination normal.       Patient with significant vertigo with rotation of the head with nausea and vomiting. Nystagmus noted with rotation of the head to the left  Skin: Skin is warm and dry. No rash noted. No erythema. No pallor.  Psychiatric: She has a normal mood and affect. Her behavior is normal. Judgment and thought content normal.    ED Course  Procedures (including critical care time)  Labs Reviewed  CBC - Abnormal; Notable for the following:    Hemoglobin 10.6 (*)     HCT 32.9 (*)     MCV 76.0 (*)     MCH 24.5 (*)     RDW 15.9 (*)     All other components within  normal limits  BASIC METABOLIC PANEL - Abnormal; Notable for the following:    Potassium 3.1 (*)     Glucose, Bld 267 (*)     All other components within normal limits  URINALYSIS, ROUTINE W REFLEX MICROSCOPIC - Abnormal; Notable for the following:    Color, Urine AMBER (*)  BIOCHEMICALS MAY BE AFFECTED BY COLOR   Specific Gravity, Urine 1.042 (*)     Glucose, UA >1000 (*)     Bilirubin Urine SMALL (*)     Ketones, ur 15 (*)     All other  components within normal limits  POCT I-STAT TROPONIN I  URINE MICROSCOPIC-ADD ON   Ct Head Wo Contrast  04/25/2012  *RADIOLOGY REPORT*  Clinical Data: Nausea, vomiting and dizziness.  CT HEAD WITHOUT CONTRAST  Technique:  Contiguous axial images were obtained from the base of the skull through the vertex without contrast.  Comparison: CT of the head performed 12/10 and 1007  Findings: There is no evidence of acute infarction, mass lesion, or intra- or extra-axial hemorrhage on CT.  The posterior fossa, including the cerebellum, brainstem and fourth ventricle, is within normal limits.  The third and lateral ventricles, and basal ganglia are unremarkable in appearance.  The cerebral hemispheres are symmetric in appearance, with normal gray- white differentiation.  No mass effect or midline shift is seen.  There is no evidence of fracture; visualized osseous structures are unremarkable in appearance.  There is suggestion of mild bilateral proptosis; the orbits are otherwise grossly unremarkable.  The paranasal sinuses and mastoid air cells are well-aerated.  No significant soft tissue abnormalities are seen.  IMPRESSION:  1.  No acute intracranial pathology seen on CT. 2.  Suggestion of mild bilateral proptosis.  Original Report Authenticated By: Tonia Ghent, M.D.    Date: 04/25/2012  Rate: 93  Rhythm: normal sinus rhythm  QRS Axis: normal  Intervals: normal  ST/T Wave abnormalities: nonspecific T wave changes  Conduction Disutrbances:none  Narrative  Interpretation: t wave flattening  Old EKG Reviewed: changes noted    1. Vertigo   2. GERD (gastroesophageal reflux disease)   3. Dehydration       MDM  41 year old female with recent nausea and vomiting illness, now with vertigo and chest pressure/burning. Symptoms of chest pressure and burning improved after GI cocktail. EKG normal sinus rhythm with flattened lateral T waves, no ST elevation.  Suspect chest symptoms do to esophageal and gastric irritation after nausea and vomiting recently. Patient initially did feel much better after IV fluids and Antivert, but when attempting to ambulate she again got very vertiginous and nauseated. Head CT unremarkable we'll have MRI done in the morning for possible posterior circulation stroke, although I feel this is less likely. She does have good followup in the community, sees Dr. Julio Sicks      Care passed to Dr Radford Pax awaiting MRI results and improvement in vertigo symptoms.  Olivia Mackie, MD 04/25/12 [phone]

## 2012-04-25 NOTE — ED Notes (Signed)
Snack given waiting for a lunch tray.

## 2012-04-25 NOTE — ED Notes (Addendum)
Attempted to ambulate pt in the hall, pt made it approx. 23ft but then was unable to go any further, pt stated that she was extremely dizzy, lightheaded, and nauseous.... staff assisted pt to chair in hallway and pt had to be wheeled back into the room by staff. Dr Norlene Campbell is aware.

## 2012-04-25 NOTE — ED Notes (Signed)
Pt sleeping. Awakens easily. 

## 2012-04-25 NOTE — ED Notes (Addendum)
Pt stated that she started having n/v on Sunday. The n/v was intermittent until Wednesday. She now has vertigo and midsternal CP starting Thursday night. Pain does not radiate. Feels like a pressure. No diaphoresis or increases nausea or vomiting. No other neurological deficits. Pt stated that she also has been having increased SOB with exertion. Will continue to monitor.

## 2012-04-25 NOTE — ED Provider Notes (Signed)
4:20 PM The pt feels much better. Walked to the bathroom without difficulty. Home with meclizine. Sounds like vertigo  Lyanne Co, MD 04/25/12 1620

## 2012-04-25 NOTE — ED Notes (Signed)
Back from MRI.

## 2012-04-25 NOTE — ED Notes (Signed)
MD at bedside.  Pt states she is feeling better. Husband is at bedside

## 2012-04-25 NOTE — ED Notes (Signed)
Report given to day shift RN .

## 2012-04-25 NOTE — ED Notes (Signed)
Assisted pt to bed. Pt c/o dizziness and takes 2 person assist to ambulate

## 2012-04-25 NOTE — ED Notes (Signed)
Pt amb to BR and back with minimal assist.  States she feels much "better" at this time, thinks that the sleep/rest and helped with her dizziness. Pt denies dizziness at present. Also denies any pain

## 2012-04-25 NOTE — ED Provider Notes (Signed)
MRI was negative.  Attempted to ambulate patient but still complained of dizziness and vertigo.  Plan at this time is to move to CDU with continue meclizine and ambulation checks.  Patient can be discharged when she improves symptomatically.  Nelia Shi, MD 04/25/12 [phone]

## 2012-05-29 ENCOUNTER — Ambulatory Visit: Payer: Medicaid Other | Attending: Internal Medicine | Admitting: *Deleted

## 2012-05-29 DIAGNOSIS — R42 Dizziness and giddiness: Secondary | ICD-10-CM | POA: Insufficient documentation

## 2012-05-29 DIAGNOSIS — IMO0001 Reserved for inherently not codable concepts without codable children: Secondary | ICD-10-CM | POA: Insufficient documentation

## 2012-06-12 ENCOUNTER — Encounter: Payer: Medicaid Other | Admitting: Physical Therapy

## 2012-06-19 ENCOUNTER — Encounter: Payer: Medicaid Other | Admitting: *Deleted

## 2012-06-26 ENCOUNTER — Encounter: Payer: Medicaid Other | Admitting: Physical Therapy

## 2012-09-12 ENCOUNTER — Emergency Department (HOSPITAL_COMMUNITY)
Admission: EM | Admit: 2012-09-12 | Discharge: 2012-09-12 | Disposition: A | Discharge status: Home / Self Care | Payer: Medicaid Other | Source: Home / Self Care | Attending: Family Medicine | Admitting: Family Medicine

## 2012-09-12 ENCOUNTER — Encounter (HOSPITAL_COMMUNITY): Payer: Self-pay | Admitting: Emergency Medicine

## 2012-09-12 DIAGNOSIS — K047 Periapical abscess without sinus: Secondary | ICD-10-CM

## 2012-09-12 MED ORDER — PENICILLIN V POTASSIUM 500 MG PO TABS: 500.0000 mg | ORAL_TABLET | Freq: Four times a day (QID) | ORAL | Status: AC

## 2012-09-12 MED ORDER — TRAMADOL HCL 50 MG PO TABS: 50.0000 mg | ORAL_TABLET | Freq: Three times a day (TID) | ORAL | Status: DC | PRN

## 2012-09-12 MED ORDER — CHLORHEXIDINE GLUCONATE 0.12 % MT SOLN: 15.0000 mL | Freq: Two times a day (BID) | OROMUCOSAL | Status: DC

## 2012-09-12 NOTE — ED Notes (Signed)
Pt c/o dental pain since yesterday eve... Dental pain on top right side... Sx include: right ear pain, jaw pain, facial swelling on right side... Does not have dentist... Denies: fevers, vomiting, nauseas, diarrhea.... Pt is alert w/no signs of distress.

## 2012-09-12 NOTE — ED Provider Notes (Signed)
History     CSN: [id number]  Arrival date & time 09/12/12  1344   First MD Initiated Contact with Patient 09/12/12 1408      Chief Complaint  Patient presents with  . Dental Pain    (Consider location/radiation/quality/duration/timing/severity/associated sxs/prior treatment) HPI Comments: 41 year old female with history of diabetes. Here complaining of right lower jaw pain and gum swelling in the last 2 days. Pain radiating to ear and neck. I lower face facial swelling. No fever or chills. No changes in appetite. No nausea vomiting or diarrhea. No rhinorrhea or sinus pain.   Past Medical History  Diagnosis Date  . Anemia   . GERD (gastroesophageal reflux disease)   . Depression   . Anxiety   . 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 Hypertension     maintained on lisinopril hct, metoprolol-134/86 at pat visit    Past Surgical History  Procedure Date  . Cesarean section x3  . Abdominal hysterectomy 10/30/2011    Procedure: HYSTERECTOMY ABDOMINAL;  Surgeon: Bing Plume, MD;  Location: [facility];  Service: Gynecology;  Laterality: N/A;  . Salpingoophorectomy 10/30/2011    Procedure: SALPINGO OOPHERECTOMY;  Surgeon: Bing Plume, MD;  Location: [facility];  Service: Gynecology;  Laterality: Right;    No family history on file.  History  Substance Use Topics  . Smoking status: Former Smoker    Quit date: 10/24/1997  . Smokeless tobacco: Not on file  . Alcohol Use: No    OB History    Grav Para Term Preterm Abortions TAB SAB Ect Mult Living                  Review of Systems  Constitutional: Negative for fever and chills.  HENT: Positive for ear pain, facial swelling and dental problem. Negative for congestion, sore throat, trouble swallowing, sinus pressure and ear discharge.   Eyes: Negative for pain.  Respiratory: Negative for cough.   Gastrointestinal: Negative for nausea, vomiting and  abdominal pain.  Skin: Negative for rash.  Neurological: Positive for headaches. Negative for dizziness.    Allergies  Codeine and Hydrocodone  Home Medications   Current Outpatient Rx  Name  Route  Sig  Dispense  Refill  . CALCIUM CARBONATE ANTACID 1177 MG PO CHEW   Oral   Chew 3 tablets by mouth 2 times daily at 12 noon and 4 pm.         . CRANBERRY PO   Oral   Take 1 tablet by mouth daily. 3600mg          . GABAPENTIN 300 MG PO CAPS   Oral   Take 300 mg by mouth 2 (two) times daily.         Marland Kitchen GLIPIZIDE 10 MG PO TABS   Oral   Take 10 mg by mouth daily after breakfast.           . HYDROXYZINE HCL 50 MG PO TABS   Oral   Take 50 mg by mouth 2 (two) times daily.         Marland Kitchen LISINOPRIL-HYDROCHLOROTHIAZIDE 20-12.5 MG PO TABS   Oral   Take 1 tablet by mouth daily.         Marland Kitchen METFORMIN HCL 850 MG PO TABS   Oral   Take 850 mg by mouth 2 (two) times daily with a meal.         . METOPROLOL  TARTRATE 25 MG PO TABS   Oral   Take 25 mg by mouth 2 (two) times daily.          . ADULT MULTIVITAMIN W/MINERALS CH   Oral   Take 1 tablet by mouth daily.         Marland Kitchen OMEPRAZOLE 20 MG PO CPDR   Oral   Take 20 mg by mouth daily.         . ORPHENADRINE CITRATE ER 100 MG PO TB12   Oral   Take 100 mg by mouth 2 (two) times daily.         Marland Kitchen PRAVASTATIN SODIUM 20 MG PO TABS   Oral   Take 20 mg by mouth daily.         . ASPIRIN EFFERVESCENT 325 MG PO TBEF   Oral   Take 325 mg by mouth every 6 (six) hours.         . CHLORHEXIDINE GLUCONATE 0.12 % MT SOLN   Mouth/Throat   Use as directed 15 mLs in the mouth or throat 2 (two) times daily.   120 mL   0   . PENICILLIN V POTASSIUM 500 MG PO TABS   Oral   Take 1 tablet (500 mg total) by mouth 4 (four) times daily.   40 tablet   0   . SERTRALINE HCL 25 MG PO TABS   Oral   Take 25 mg by mouth daily.         . TRAMADOL HCL 50 MG PO TABS   Oral   Take 1 tablet (50 mg total) by mouth every 8 (eight) hours  as needed. For pain   20 tablet   0     BP 109/77  Pulse 85  Temp 98.2 F (36.8 C) (Oral)  Resp 20  SpO2 99%  LMP 10/07/2011  Physical Exam  Nursing note and vitals reviewed. Constitutional: She is oriented to person, place, and time. She appears well-developed and well-nourished.  HENT:  Head: Normocephalic and atraumatic.  Right Ear: External ear normal.  Left Ear: External ear normal.  Nose: Nose normal.       No pharyngeal erythema or exudates. There is gum swelling and white exudates around apical process of the right lower posterior molar.   Eyes: Conjunctivae normal are normal. Right eye exhibits no discharge. Left eye exhibits no discharge.  Neck: Neck supple.       Right submandibular tender mildly enlarged lymphadenopathy.  Cardiovascular: Normal rate, regular rhythm and normal heart sounds.   No murmur heard. Pulmonary/Chest: Breath sounds normal.  Neurological: She is alert and oriented to person, place, and time.    ED Course  Procedures (including critical care time)  Labs Reviewed - No data to display No results found.   1. Dental abscess       MDM  41 year old diabetic who reports good diabetes control. Here with dental abscess in the right lower jaw. Prescribed penicillin, tramadol and Peridex mouthwash. Dental community resources provided to establish care with a dentist for followup.        Sharin Grave, MD 09/12/12 2028

## 2012-09-12 NOTE — ED Notes (Signed)
Pt's husband stated that the pt's BS was dropping... Pt given crackers w/peanut butter.

## 2012-12-05 ENCOUNTER — Other Ambulatory Visit: Payer: Self-pay

## 2012-12-29 ENCOUNTER — Emergency Department (HOSPITAL_COMMUNITY)
Admission: EM | Admit: 2012-12-29 | Discharge: 2012-12-30 | Disposition: A | Discharge status: Home / Self Care | Payer: Medicaid Other | Attending: Emergency Medicine | Admitting: Emergency Medicine

## 2012-12-29 ENCOUNTER — Encounter (HOSPITAL_COMMUNITY): Payer: Self-pay | Admitting: *Deleted

## 2012-12-29 DIAGNOSIS — R197 Diarrhea, unspecified: Secondary | ICD-10-CM | POA: Insufficient documentation

## 2012-12-29 DIAGNOSIS — Z79899 Other long term (current) drug therapy: Secondary | ICD-10-CM | POA: Insufficient documentation

## 2012-12-29 DIAGNOSIS — E119 Type 2 diabetes mellitus without complications: Secondary | ICD-10-CM | POA: Insufficient documentation

## 2012-12-29 DIAGNOSIS — F3289 Other specified depressive episodes: Secondary | ICD-10-CM | POA: Insufficient documentation

## 2012-12-29 DIAGNOSIS — Z862 Personal history of diseases of the blood and blood-forming organs and certain disorders involving the immune mechanism: Secondary | ICD-10-CM | POA: Insufficient documentation

## 2012-12-29 DIAGNOSIS — A088 Other specified intestinal infections: Secondary | ICD-10-CM | POA: Insufficient documentation

## 2012-12-29 DIAGNOSIS — R1013 Epigastric pain: Secondary | ICD-10-CM | POA: Insufficient documentation

## 2012-12-29 DIAGNOSIS — Z794 Long term (current) use of insulin: Secondary | ICD-10-CM | POA: Insufficient documentation

## 2012-12-29 DIAGNOSIS — A084 Viral intestinal infection, unspecified: Secondary | ICD-10-CM

## 2012-12-29 DIAGNOSIS — Z87891 Personal history of nicotine dependence: Secondary | ICD-10-CM | POA: Insufficient documentation

## 2012-12-29 DIAGNOSIS — K219 Gastro-esophageal reflux disease without esophagitis: Secondary | ICD-10-CM | POA: Insufficient documentation

## 2012-12-29 DIAGNOSIS — R5381 Other malaise: Secondary | ICD-10-CM | POA: Insufficient documentation

## 2012-12-29 DIAGNOSIS — F411 Generalized anxiety disorder: Secondary | ICD-10-CM | POA: Insufficient documentation

## 2012-12-29 DIAGNOSIS — I10 Essential (primary) hypertension: Secondary | ICD-10-CM | POA: Insufficient documentation

## 2012-12-29 LAB — URINALYSIS, ROUTINE W REFLEX MICROSCOPIC
Leukocytes, UA: NEGATIVE
Nitrite: NEGATIVE
Specific Gravity, Urine: 1.029 (ref 1.005–1.030)
Urobilinogen, UA: 0.2 mg/dL (ref 0.0–1.0)

## 2012-12-29 LAB — CBC WITH DIFFERENTIAL/PLATELET
Basophils Absolute: 0 10*3/uL (ref 0.0–0.1)
Eosinophils Absolute: 0.1 10*3/uL (ref 0.0–0.7)
Eosinophils Relative: 1 % (ref 0–5)
HCT: 36.1 % (ref 36.0–46.0)
Lymphocytes Relative: 12 % (ref 12–46)
Lymphs Abs: 1 10*3/uL (ref 0.7–4.0)
MCH: 26.4 pg (ref 26.0–34.0)
MCV: 80.8 fL (ref 78.0–100.0)
Monocytes Absolute: 0.3 10*3/uL (ref 0.1–1.0)
RDW: 14.8 % (ref 11.5–15.5)
WBC: 8.3 10*3/uL (ref 4.0–10.5)

## 2012-12-29 LAB — COMPREHENSIVE METABOLIC PANEL
CO2: 25 mEq/L (ref 19–32)
Calcium: 9.3 mg/dL (ref 8.4–10.5)
Creatinine, Ser: 0.57 mg/dL (ref 0.50–1.10)
GFR calc Af Amer: >90 mL/min (ref >90)
GFR calc non Af Amer: >90 mL/min (ref >90)
Glucose, Bld: 263 mg/dL — ABNORMAL HIGH (ref 70–99)

## 2012-12-29 LAB — URINE MICROSCOPIC-ADD ON

## 2012-12-29 MED ORDER — ONDANSETRON HCL 4 MG/2ML IJ SOLN
4.0000 mg | Freq: Once | INTRAMUSCULAR | Status: AC
  Administered 2012-12-29: 4 mg via INTRAVENOUS
  Filled 2012-12-29: qty 2

## 2012-12-29 MED ORDER — MORPHINE SULFATE 4 MG/ML IJ SOLN
4.0000 mg | Freq: Once | INTRAMUSCULAR | Status: AC
  Administered 2012-12-29: 4 mg via INTRAVENOUS
  Filled 2012-12-29: qty 1

## 2012-12-29 MED ORDER — SODIUM CHLORIDE 0.9 % IV BOLUS (SEPSIS)
1000.0000 mL | Freq: Once | INTRAVENOUS | Status: AC
  Administered 2012-12-29: 1000 mL via INTRAVENOUS

## 2012-12-29 NOTE — ED Provider Notes (Signed)
History     CSN: [id number]  Arrival date & time 12/29/12  1904   First MD Initiated Contact with Patient 12/29/12 2011      Chief Complaint  Patient presents with  . Nausea  . Emesis  . Diarrhea    (Consider location/radiation/quality/duration/timing/severity/associated sxs/prior treatment) HPI History provided by pt.   Pt has had intermittent, severe epigastric cramping and several episodes of vomiting and diarrhea since 11am today.  Associated w/ weakness.  Denies fever, hematemesis/hematochezia/melena, urinary and vaginal sx.  Past abd surgeries include c-section and and hysterectomy.  H/o diabetes and HTN.  Compliant w/ all medications.  Started duloxetine yesterday.  Past Medical History  Diagnosis Date  . Anemia   . GERD (gastroesophageal reflux disease)   . Depression   . Anxiety   . 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 Hypertension     maintained on lisinopril hct, metoprolol-134/86 at pat visit    Past Surgical History  Procedure Laterality Date  . Cesarean section  x3  . Abdominal hysterectomy  10/30/2011    Procedure: HYSTERECTOMY ABDOMINAL;  Surgeon: Bing Plume, MD;  Location: [facility];  Service: Gynecology;  Laterality: N/A;  . Salpingoophorectomy  10/30/2011    Procedure: SALPINGO OOPHERECTOMY;  Surgeon: Bing Plume, MD;  Location: [facility];  Service: Gynecology;  Laterality: Right;    No family history on file.  History  Substance Use Topics  . Smoking status: Former Smoker    Quit date: 10/24/1997  . Smokeless tobacco: Not on file  . Alcohol Use: No    OB History   Grav Para Term Preterm Abortions TAB SAB Ect Mult Living                  Review of Systems  All other systems reviewed and are negative.    Allergies  Codeine and Hydrocodone  Home Medications   Current Outpatient Rx  Name  Route  Sig  Dispense  Refill  . atenolol-chlorthalidone (TENORETIC)  100-25 MG per tablet   Oral   Take 1 tablet by mouth daily.         Marland Kitchen buPROPion (WELLBUTRIN XL) 150 MG 24 hr tablet   Oral   Take 150 mg by mouth daily.         . DULoxetine (CYMBALTA) 30 MG capsule   Oral   Take 30 mg by mouth at bedtime.         . gabapentin (NEURONTIN) 300 MG capsule   Oral   Take 600 mg by mouth 3 (three) times daily.          Marland Kitchen glipiZIDE (GLUCOTROL) 10 MG tablet   Oral   Take 10 mg by mouth 2 (two) times daily before a meal.          . ibuprofen (ADVIL,MOTRIN) 200 MG tablet   Oral   Take 800 mg by mouth every 6 (six) hours as needed for pain.         Marland Kitchen insulin aspart protamine-insulin aspart (NOVOLOG 70/30) (70-30) 100 UNIT/ML injection   Subcutaneous   Inject 25 Units into the skin 2 (two) times daily with a meal.         . lisdexamfetamine (VYVANSE) 20 MG capsule   Oral   Take 20 mg by mouth every morning.         . metformin (FORTAMET) 1000 MG (OSM) 24 hr tablet  Oral   Take 1,000 mg by mouth 2 (two) times daily with a meal.         . metoprolol tartrate (LOPRESSOR) 25 MG tablet   Oral   Take 25 mg by mouth 2 (two) times daily.          Marland Kitchen omeprazole (PRILOSEC) 20 MG capsule   Oral   Take 20 mg by mouth daily.         Marland Kitchen rOPINIRole (REQUIP) 1 MG tablet   Oral   Take 1 mg by mouth 3 (three) times daily.         . traMADol (ULTRAM) 50 MG tablet   Oral   Take 1 tablet (50 mg total) by mouth every 8 (eight) hours as needed. For pain   20 tablet   0     BP 129/78  Pulse 121  Temp(Src) 98.5 F (36.9 C) (Oral)  Resp 22  Ht 5\' 5"  (1.651 m)  Wt 213 lb (96.616 kg)  BMI 35.44 kg/m2  SpO2 98%  LMP 10/07/2011  Physical Exam  Nursing note and vitals reviewed. Constitutional: She is oriented to person, place, and time. She appears well-developed and well-nourished. No distress.  HENT:  Head: Normocephalic and atraumatic.  Dry mucous membranes  Eyes:  Normal appearance  Neck: Normal range of motion.   Cardiovascular: Regular rhythm.   tachycardia  Pulmonary/Chest: Effort normal and breath sounds normal. No respiratory distress.  Abdominal: Soft. Bowel sounds are normal. She exhibits no distension and no mass. There is no rebound and no guarding.  Obese.  LLQ ttp but pt reports this is chronic since hysterectomy.  Epigastrium non-tender.   Genitourinary:  No CVA tenderness  Musculoskeletal: Normal range of motion.  Neurological: She is alert and oriented to person, place, and time.  Skin: Skin is warm and dry. No rash noted.  Psychiatric: She has a normal mood and affect. Her behavior is normal.    ED Course  Procedures (including critical care time)  Labs Reviewed  CBC WITH DIFFERENTIAL - Abnormal; Notable for the following:    Hemoglobin 11.8 (*)    Neutrophils Relative 84 (*)    All other components within normal limits  COMPREHENSIVE METABOLIC PANEL - Abnormal; Notable for the following:    Potassium 3.3 (*)    Glucose, Bld 263 (*)    All other components within normal limits  URINALYSIS, ROUTINE W REFLEX MICROSCOPIC - Abnormal; Notable for the following:    Glucose, UA >1000 (*)    All other components within normal limits  URINE MICROSCOPIC-ADD ON   No results found.   1. Viral gastroenteritis       MDM  42yo F presents w/ acute onset vomiting, diarrhea and epigastric pain at 11am today.  On exam, afebrile, dehydrated, uncomfortable appearing, abd benign.  Suspect viral gastroenteritis but started cymbalta yesterday and could be a side effect.  Labs obtained by nursing staff prior to my exam.  Pt receiving IVF, morphine and zofran.  Will reassess shortly.  8:38 PM    Pt received a second dose of morphine for persistent pain and now both nausea and pain are resolved.  Continues to complain of weakness.  A second liter bolus of NS has been started.  Labs unremarkable w/ exception of urinalysis which is still pending.  11:25 PM   U/A neg.  All results discussed w/  patient.  She has received 2L NS and is currently asx and tolerating pos.  D/c'd home w/  percocet and zofran.  Return precautions discussed. 12:09 AM         Otilio Miu, PA-C 12/30/12 0010

## 2012-12-29 NOTE — ED Notes (Signed)
PER EMS- pt picked up from home with c/o nausea, vomiting, diarrhea, and L hand weakness.  EMS reports no neuro deficits.

## 2012-12-29 NOTE — ED Notes (Addendum)
Glucose 225

## 2012-12-29 NOTE — ED Notes (Signed)
Pt brought in by EMS; vomiting/diarrhea since 1100 am

## 2012-12-30 LAB — GLUCOSE, CAPILLARY: Glucose-Capillary: 225 mg/dL — ABNORMAL HIGH (ref 70–99)

## 2012-12-30 MED ORDER — OXYCODONE-ACETAMINOPHEN 5-325 MG PO TABS: 2.0000 | ORAL_TABLET | ORAL | Status: DC | PRN

## 2012-12-30 MED ORDER — ONDANSETRON HCL 8 MG PO TABS: 8.0000 mg | ORAL_TABLET | Freq: Three times a day (TID) | ORAL | Status: DC | PRN

## 2013-01-02 NOTE — ED Provider Notes (Signed)
Medical screening examination/treatment/procedure(s) were performed by non-physician practitioner and as supervising physician I was immediately available for consultation/collaboration.  Anthony T Allen, MD 01/02/13 1643 

## 2013-01-15 ENCOUNTER — Encounter (HOSPITAL_COMMUNITY): Payer: Self-pay

## 2013-01-15 ENCOUNTER — Emergency Department (HOSPITAL_COMMUNITY)
Admission: EM | Admit: 2013-01-15 | Discharge: 2013-01-15 | Disposition: A | Discharge status: Home / Self Care | Payer: Medicaid Other | Attending: Emergency Medicine | Admitting: Emergency Medicine

## 2013-01-15 DIAGNOSIS — I10 Essential (primary) hypertension: Secondary | ICD-10-CM | POA: Insufficient documentation

## 2013-01-15 DIAGNOSIS — Z794 Long term (current) use of insulin: Secondary | ICD-10-CM | POA: Insufficient documentation

## 2013-01-15 DIAGNOSIS — F3289 Other specified depressive episodes: Secondary | ICD-10-CM | POA: Insufficient documentation

## 2013-01-15 DIAGNOSIS — R110 Nausea: Secondary | ICD-10-CM | POA: Insufficient documentation

## 2013-01-15 DIAGNOSIS — R61 Generalized hyperhidrosis: Secondary | ICD-10-CM | POA: Insufficient documentation

## 2013-01-15 DIAGNOSIS — R1032 Left lower quadrant pain: Secondary | ICD-10-CM | POA: Insufficient documentation

## 2013-01-15 DIAGNOSIS — R079 Chest pain, unspecified: Secondary | ICD-10-CM

## 2013-01-15 DIAGNOSIS — R0609 Other forms of dyspnea: Secondary | ICD-10-CM | POA: Insufficient documentation

## 2013-01-15 DIAGNOSIS — Z862 Personal history of diseases of the blood and blood-forming organs and certain disorders involving the immune mechanism: Secondary | ICD-10-CM | POA: Insufficient documentation

## 2013-01-15 DIAGNOSIS — E119 Type 2 diabetes mellitus without complications: Secondary | ICD-10-CM | POA: Insufficient documentation

## 2013-01-15 DIAGNOSIS — F329 Major depressive disorder, single episode, unspecified: Secondary | ICD-10-CM | POA: Insufficient documentation

## 2013-01-15 DIAGNOSIS — D649 Anemia, unspecified: Secondary | ICD-10-CM | POA: Insufficient documentation

## 2013-01-15 DIAGNOSIS — R0602 Shortness of breath: Secondary | ICD-10-CM | POA: Insufficient documentation

## 2013-01-15 DIAGNOSIS — R42 Dizziness and giddiness: Secondary | ICD-10-CM | POA: Insufficient documentation

## 2013-01-15 DIAGNOSIS — R0989 Other specified symptoms and signs involving the circulatory and respiratory systems: Secondary | ICD-10-CM | POA: Insufficient documentation

## 2013-01-15 DIAGNOSIS — Z87891 Personal history of nicotine dependence: Secondary | ICD-10-CM | POA: Insufficient documentation

## 2013-01-15 DIAGNOSIS — K219 Gastro-esophageal reflux disease without esophagitis: Secondary | ICD-10-CM | POA: Insufficient documentation

## 2013-01-15 DIAGNOSIS — F411 Generalized anxiety disorder: Secondary | ICD-10-CM | POA: Insufficient documentation

## 2013-01-15 DIAGNOSIS — Z79899 Other long term (current) drug therapy: Secondary | ICD-10-CM | POA: Insufficient documentation

## 2013-01-15 DIAGNOSIS — R109 Unspecified abdominal pain: Secondary | ICD-10-CM

## 2013-01-15 LAB — HEPATIC FUNCTION PANEL
Albumin: 3.8 g/dL (ref 3.5–5.2)
Alkaline Phosphatase: 86 U/L (ref 39–117)
Total Bilirubin: 0.2 mg/dL — ABNORMAL LOW (ref 0.3–1.2)

## 2013-01-15 LAB — CBC
Platelets: 382 10*3/uL (ref 150–400)
RDW: 15.1 % (ref 11.5–15.5)
WBC: 10.4 10*3/uL (ref 4.0–10.5)

## 2013-01-15 LAB — BASIC METABOLIC PANEL
Chloride: 98 mEq/L (ref 96–112)
GFR calc Af Amer: >90 mL/min (ref >90)
Potassium: 3.8 mEq/L (ref 3.5–5.1)
Sodium: 137 mEq/L (ref 135–145)

## 2013-01-15 LAB — D-DIMER, QUANTITATIVE: D-Dimer, Quant: <0.27 ug/mL-FEU (ref 0.00–0.48)

## 2013-01-15 LAB — PRO B NATRIURETIC PEPTIDE: Pro B Natriuretic peptide (BNP): <5 pg/mL (ref 0–125)

## 2013-01-15 LAB — GLUCOSE, CAPILLARY

## 2013-01-15 MED ORDER — ASPIRIN 81 MG PO CHEW
324.0000 mg | CHEWABLE_TABLET | Freq: Once | ORAL | Status: AC
  Administered 2013-01-15: 324 mg via ORAL
  Filled 2013-01-15: qty 4

## 2013-01-15 MED ORDER — ONDANSETRON HCL 4 MG/2ML IJ SOLN
4.0000 mg | Freq: Once | INTRAMUSCULAR | Status: AC
  Administered 2013-01-15: 4 mg via INTRAVENOUS
  Filled 2013-01-15: qty 2

## 2013-01-15 NOTE — ED Provider Notes (Signed)
History     CSN: [id number]  Arrival date & time 01/15/13  1324   First MD Initiated Contact with Patient 01/15/13 1458      Chief Complaint  Patient presents with  . Chest Pain  . Abdominal Pain    (Consider location/radiation/quality/duration/timing/severity/associated sxs/prior treatment) Patient is a 42 y.o. female presenting with chest pain and abdominal pain. The history is provided by the patient.  Chest Pain Associated symptoms: abdominal pain   Abdominal Pain Associated symptoms: chest pain   She woke up at 4 AM with a severe heavy feeling in the left side of her chest. She describes it as a heavy feeling. There is associated dyspnea, nausea, diaphoresis. She has not vomited. Denies cough or fever. She also developed in the left lower quadrant pain. These pains been severe and she rates them at 9/10. He says pain is worse with exertion such as walking. Abdominal pain is worse when she coughs or sneezes. To the dose of ibuprofen which gave her slight relief of the abdominal pain but not the chest pain. She denies constipation or diarrhea. She's not taken any aspirin. She does have significant cardiac risk factors of diabetes, hypertension, hyperlipidemia, and family history of premature coronary atherosclerosis-a grandmother died in her 6s and her mother died in her 75s of cardiac disease  Past Medical History  Diagnosis Date  . Anemia   . GERD (gastroesophageal reflux disease)   . Depression   . Anxiety   . 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 Hypertension     maintained on lisinopril hct, metoprolol-134/86 at pat visit    Past Surgical History  Procedure Laterality Date  . Cesarean section  x3  . Abdominal hysterectomy  10/30/2011    Procedure: HYSTERECTOMY ABDOMINAL;  Surgeon: Bing Plume, MD;  Location: [facility];  Service: Gynecology;  Laterality: N/A;  . Salpingoophorectomy  10/30/2011   Procedure: SALPINGO OOPHERECTOMY;  Surgeon: Bing Plume, MD;  Location: [facility];  Service: Gynecology;  Laterality: Right;    History reviewed. No pertinent family history.  History  Substance Use Topics  . Smoking status: Former Smoker    Quit date: 10/24/1997  . Smokeless tobacco: Not on file  . Alcohol Use: No    OB History   Grav Para Term Preterm Abortions TAB SAB Ect Mult Living                  Review of Systems  Cardiovascular: Positive for chest pain.  Gastrointestinal: Positive for abdominal pain.  All other systems reviewed and are negative.    Allergies  Codeine; Hydrocodone; and Percocet  Home Medications   Current Outpatient Rx  Name  Route  Sig  Dispense  Refill  . albuterol (PROVENTIL HFA;VENTOLIN HFA) 108 (90 BASE) MCG/ACT inhaler   Inhalation   Inhale 2 puffs into the lungs every 6 (six) hours as needed for wheezing.         Marland Kitchen atenolol-chlorthalidone (TENORETIC) 100-25 MG per tablet   Oral   Take 1 tablet by mouth daily.         Marland Kitchen buPROPion (WELLBUTRIN XL) 150 MG 24 hr tablet   Oral   Take 150 mg by mouth daily.         . DULoxetine (CYMBALTA) 30 MG capsule   Oral   Take 30 mg by mouth at bedtime.         Marland Kitchen  gabapentin (NEURONTIN) 600 MG tablet   Oral   Take 600 mg by mouth 3 (three) times daily.         Marland Kitchen glipiZIDE (GLUCOTROL) 10 MG tablet   Oral   Take 10 mg by mouth 2 (two) times daily before a meal.          . Ibuprofen-Diphenhydramine Cit 200-38 MG TABS   Oral   Take 4 tablets by mouth at bedtime as needed (for sleep).         . insulin aspart protamine-insulin aspart (NOVOLOG 70/30) (70-30) 100 UNIT/ML injection   Subcutaneous   Inject 35 Units into the skin 2 (two) times daily with a meal.         . lisinopril (PRINIVIL,ZESTRIL) 20 MG tablet   Oral   Take 20 mg by mouth daily.         . metFORMIN (GLUCOPHAGE) 1000 MG tablet   Oral   Take 1,000 mg by mouth 2 (two) times daily with a meal.         .  metoprolol (LOPRESSOR) 50 MG tablet   Oral   Take 50 mg by mouth 2 (two) times daily.         Marland Kitchen omeprazole (PRILOSEC) 20 MG capsule   Oral   Take 20 mg by mouth daily.         . ondansetron (ZOFRAN) 8 MG tablet   Oral   Take 1 tablet (8 mg total) by mouth every 8 (eight) hours as needed for nausea.   20 tablet   0   . oxyCODONE-acetaminophen (PERCOCET/ROXICET) 5-325 MG per tablet   Oral   Take 2 tablets by mouth every 4 (four) hours as needed for pain.   20 tablet   0   . rOPINIRole (REQUIP) 1 MG tablet   Oral   Take 1 mg by mouth 3 (three) times daily.           BP 116/75  Pulse 82  Temp(Src) 98.4 F (36.9 C) (Oral)  Resp 21  SpO2 100%  LMP 10/07/2011  Physical Exam  Nursing note and vitals reviewed.  42 year old female, resting comfortably and in no acute distress. Vital signs are normal. Oxygen saturation is 100%, which is normal. Head is normocephalic and atraumatic. PERRLA, EOMI. Oropharynx is clear. Neck is nontender and supple without adenopathy or JVD. Back is nontender and there is no CVA tenderness. Lungs are clear without rales, wheezes, or rhonchi. Chest is nontender. Heart has regular rate and rhythm without murmur. Abdomen is soft, flat, with moderate left lower quadrant tenderness. There is no rebound or guarding. There are no without masses or hepatosplenomegaly and peristalsis is hypoactive. Extremities have no cyanosis or edema, full range of motion is present. Skin is warm and dry without rash. Neurologic: Mental status is normal, cranial nerves are intact, there are no motor or sensory deficits.  ED Course  Procedures (including critical care time)  Results for orders placed during the hospital encounter of 01/15/13  CBC      Result Value Range   WBC 10.4  4.0 - 10.5 K/uL   RBC 4.40  3.87 - 5.11 MIL/uL   Hemoglobin 11.8 (*) 12.0 - 15.0 g/dL   HCT 96.0  45.4 - 09.8 %   MCV 82.0  78.0 - 100.0 fL   MCH 26.8  26.0 - 34.0 pg   MCHC  32.7  30.0 - 36.0 g/dL   RDW 11.9  14.7 - 82.9 %  Platelets 382  150 - 400 K/uL  BASIC METABOLIC PANEL      Result Value Range   Sodium 137  135 - 145 mEq/L   Potassium 3.8  3.5 - 5.1 mEq/L   Chloride 98  96 - 112 mEq/L   CO2 27  19 - 32 mEq/L   Glucose, Bld 201 (*) 70 - 99 mg/dL   BUN 10  6 - 23 mg/dL   Creatinine, Ser 1.61  0.50 - 1.10 mg/dL   Calcium 09.6  8.4 - 04.5 mg/dL   GFR calc non Af Amer >90  >90 mL/min   GFR calc Af Amer >90  >90 mL/min  PRO B NATRIURETIC PEPTIDE      Result Value Range   Pro B Natriuretic peptide (BNP) <5.0  0 - 125 pg/mL  GLUCOSE, CAPILLARY      Result Value Range   Glucose-Capillary 219 (*) 70 - 99 mg/dL   Comment 1 Documented in Chart     Comment 2 Notify RN    HEPATIC FUNCTION PANEL      Result Value Range   Total Protein 8.0  6.0 - 8.3 g/dL   Albumin 3.8  3.5 - 5.2 g/dL   AST 33  0 - 37 U/L   ALT 31  0 - 35 U/L   Alkaline Phosphatase 86  39 - 117 U/L   Total Bilirubin 0.2 (*) 0.3 - 1.2 mg/dL   Bilirubin, Direct <4.0  0.0 - 0.3 mg/dL   Indirect Bilirubin NOT CALCULATED  0.3 - 0.9 mg/dL  D-DIMER, QUANTITATIVE      Result Value Range   D-Dimer, Quant <0.27  0.00 - 0.48 ug/mL-FEU  POCT I-STAT TROPONIN I      Result Value Range   Troponin i, poc 0.00  0.00 - 0.08 ng/mL   Comment 3             ECG shows normal sinus rhythm with a rate of 82, no ectopy. Normal axis. Normal P wave. Normal QRS. Normal intervals. Normal ST and T waves. Impression: normal ECG. No prior ECG available for comparison.   1. Chest pain   2. Abdominal pain       MDM  Chest pain and abdominal pain of uncertain cause. She's given aspirin. ECG shows no acute changes and initial troponin is negative. D-dimer will be checked.  Workup in the ED is negative including normal d-dimer and negative BNP. She is mildly anemic but this is unchanged from baseline. With simple observation, all of her symptoms have resolved. It is elected to discharge her to follow up with her  PCP next week.      Dione Booze, MD 01/15/13 [phone]

## 2013-01-15 NOTE — ED Notes (Signed)
Pt c/o sudden onset of (L) side chest pain, dizziness, and SOB starting 0400 this am.

## 2013-03-05 ENCOUNTER — Emergency Department (HOSPITAL_COMMUNITY): Payer: Medicaid Other

## 2013-03-05 ENCOUNTER — Emergency Department (HOSPITAL_COMMUNITY)
Admission: EM | Admit: 2013-03-05 | Discharge: 2013-03-05 | Disposition: A | Discharge status: Home / Self Care | Payer: Medicaid Other | Attending: Emergency Medicine | Admitting: Emergency Medicine

## 2013-03-05 ENCOUNTER — Encounter (HOSPITAL_COMMUNITY): Payer: Self-pay | Admitting: Cardiology

## 2013-03-05 DIAGNOSIS — M79609 Pain in unspecified limb: Secondary | ICD-10-CM | POA: Insufficient documentation

## 2013-03-05 DIAGNOSIS — F329 Major depressive disorder, single episode, unspecified: Secondary | ICD-10-CM | POA: Insufficient documentation

## 2013-03-05 DIAGNOSIS — G43109 Migraine with aura, not intractable, without status migrainosus: Secondary | ICD-10-CM

## 2013-03-05 DIAGNOSIS — F3289 Other specified depressive episodes: Secondary | ICD-10-CM | POA: Insufficient documentation

## 2013-03-05 DIAGNOSIS — R209 Unspecified disturbances of skin sensation: Secondary | ICD-10-CM | POA: Insufficient documentation

## 2013-03-05 DIAGNOSIS — Z79899 Other long term (current) drug therapy: Secondary | ICD-10-CM | POA: Insufficient documentation

## 2013-03-05 DIAGNOSIS — Z862 Personal history of diseases of the blood and blood-forming organs and certain disorders involving the immune mechanism: Secondary | ICD-10-CM | POA: Insufficient documentation

## 2013-03-05 DIAGNOSIS — M6281 Muscle weakness (generalized): Secondary | ICD-10-CM | POA: Insufficient documentation

## 2013-03-05 DIAGNOSIS — R42 Dizziness and giddiness: Secondary | ICD-10-CM | POA: Insufficient documentation

## 2013-03-05 DIAGNOSIS — R29898 Other symptoms and signs involving the musculoskeletal system: Secondary | ICD-10-CM

## 2013-03-05 DIAGNOSIS — Z87891 Personal history of nicotine dependence: Secondary | ICD-10-CM | POA: Insufficient documentation

## 2013-03-05 DIAGNOSIS — M542 Cervicalgia: Secondary | ICD-10-CM | POA: Insufficient documentation

## 2013-03-05 DIAGNOSIS — E119 Type 2 diabetes mellitus without complications: Secondary | ICD-10-CM | POA: Insufficient documentation

## 2013-03-05 DIAGNOSIS — F411 Generalized anxiety disorder: Secondary | ICD-10-CM | POA: Insufficient documentation

## 2013-03-05 DIAGNOSIS — R279 Unspecified lack of coordination: Secondary | ICD-10-CM | POA: Insufficient documentation

## 2013-03-05 DIAGNOSIS — K219 Gastro-esophageal reflux disease without esophagitis: Secondary | ICD-10-CM | POA: Insufficient documentation

## 2013-03-05 DIAGNOSIS — G8929 Other chronic pain: Secondary | ICD-10-CM | POA: Insufficient documentation

## 2013-03-05 DIAGNOSIS — Z794 Long term (current) use of insulin: Secondary | ICD-10-CM | POA: Insufficient documentation

## 2013-03-05 DIAGNOSIS — I10 Essential (primary) hypertension: Secondary | ICD-10-CM | POA: Insufficient documentation

## 2013-03-05 HISTORY — DX: Other symptoms and signs involving the musculoskeletal system: R29.898

## 2013-03-05 LAB — COMPREHENSIVE METABOLIC PANEL
Albumin: 3.8 g/dL (ref 3.5–5.2)
Alkaline Phosphatase: 67 U/L (ref 39–117)
BUN: 10 mg/dL (ref 6–23)
CO2: 26 mEq/L (ref 19–32)
Chloride: 101 mEq/L (ref 96–112)
Creatinine, Ser: 0.68 mg/dL (ref 0.50–1.10)
GFR calc Af Amer: >90 mL/min (ref >90)
GFR calc non Af Amer: >90 mL/min (ref >90)
Glucose, Bld: 202 mg/dL — ABNORMAL HIGH (ref 70–99)
Potassium: 3.3 mEq/L — ABNORMAL LOW (ref 3.5–5.1)
Sodium: 139 mEq/L (ref 135–145)
Total Bilirubin: 0.3 mg/dL (ref 0.3–1.2)

## 2013-03-05 LAB — GLUCOSE, CAPILLARY: Glucose-Capillary: 186 mg/dL — ABNORMAL HIGH (ref 70–99)

## 2013-03-05 LAB — CBC
HCT: 34.1 % — ABNORMAL LOW (ref 36.0–46.0)
Hemoglobin: 11.4 g/dL — ABNORMAL LOW (ref 12.0–15.0)
MCH: 26.8 pg (ref 26.0–34.0)
MCV: 80.2 fL (ref 78.0–100.0)
Platelets: 313 10*3/uL (ref 150–400)
RBC: 4.25 MIL/uL (ref 3.87–5.11)
WBC: 9.4 10*3/uL (ref 4.0–10.5)

## 2013-03-05 LAB — DIFFERENTIAL
Basophils Absolute: 0 10*3/uL (ref 0.0–0.1)
Eosinophils Absolute: 0.1 10*3/uL (ref 0.0–0.7)
Eosinophils Relative: 1 % (ref 0–5)
Lymphocytes Relative: 32 % (ref 12–46)
Neutrophils Relative %: 63 % (ref 43–77)

## 2013-03-05 LAB — URINALYSIS, ROUTINE W REFLEX MICROSCOPIC
Glucose, UA: 500 mg/dL — AB
Hgb urine dipstick: NEGATIVE
Leukocytes, UA: NEGATIVE
Specific Gravity, Urine: 1.027 (ref 1.005–1.030)
pH: 5.5 (ref 5.0–8.0)

## 2013-03-05 LAB — ETHANOL: Alcohol, Ethyl (B): <11 mg/dL (ref 0–11)

## 2013-03-05 LAB — BASIC METABOLIC PANEL
CO2: 26 mEq/L (ref 19–32)
Chloride: 100 mEq/L (ref 96–112)
Creatinine, Ser: 0.65 mg/dL (ref 0.50–1.10)
Glucose, Bld: 208 mg/dL — ABNORMAL HIGH (ref 70–99)

## 2013-03-05 LAB — TROPONIN I: Troponin I: <0.3 ng/mL (ref <0.30)

## 2013-03-05 LAB — PROTIME-INR: INR: 1.01 (ref 0.00–1.49)

## 2013-03-05 LAB — RAPID URINE DRUG SCREEN, HOSP PERFORMED: Opiates: NOT DETECTED

## 2013-03-05 LAB — APTT: aPTT: 31 seconds (ref 24–37)

## 2013-03-05 NOTE — Consult Note (Signed)
Referring Physician: Landry Dyke    Chief Complaint: right arm weakness  HPI:                                                                                                                                         Vanessa Case is an 42 y.o. female with stroke risk factors of DM and HTN. She states she has had HA in last 24 hours described as bilateral, throbbing, 6/10 with no phono or photophobia. She admits to being under a lot of stress which may precipitate this HA but denies any chronic HA's.  This AM she woke up and noted both right hand and arm felt heavy along with tingling in her right hand over her hypothenar eminence and middle to small finger.  Due to these finding she was brought to the ED.  On exam she has a flat affect but did have right arm weakness compared to the left but denies any sensory changes. Initial CT head was negative for mass or bleed.   Date last known well: 5.15.2014 Time last known well: Unable to determine--morning of 5.15.2014 tPA Given: No: out of window  Past Medical History  Diagnosis Date  . Anemia   . GERD (gastroesophageal reflux disease)   . Depression   . Anxiety   . 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 Hypertension     maintained on lisinopril hct, metoprolol-134/86 at pat visit    Past Surgical History  Procedure Laterality Date  . Cesarean section  x3  . Abdominal hysterectomy  10/30/2011    Procedure: HYSTERECTOMY ABDOMINAL;  Surgeon: Bing Plume, MD;  Location: [facility];  Service: Gynecology;  Laterality: N/A;  . Salpingoophorectomy  10/30/2011    Procedure: SALPINGO OOPHERECTOMY;  Surgeon: Bing Plume, MD;  Location: [facility];  Service: Gynecology;  Laterality: Right;    History reviewed. No pertinent family history. Social History:  reports that she quit smoking about 15 years ago. She does not have any smokeless tobacco history on file. She reports that she  does not drink alcohol. Her drug history includes acid many years ago.  Allergies:  Allergies  Allergen Reactions  . Codeine Nausea And Vomiting  . Hydrocodone Nausea And Vomiting  . Percocet (Oxycodone-Acetaminophen)     Medications:  No current facility-administered medications for this encounter.   Current Outpatient Prescriptions  Medication Sig Dispense Refill  . albuterol (PROVENTIL HFA;VENTOLIN HFA) 108 (90 BASE) MCG/ACT inhaler Inhale 2 puffs into the lungs every 6 (six) hours as needed for wheezing or shortness of breath.       Marland Kitchen atenolol-chlorthalidone (TENORETIC) 100-25 MG per tablet Take 1 tablet by mouth daily.      Marland Kitchen buPROPion (WELLBUTRIN XL) 150 MG 24 hr tablet Take 150 mg by mouth daily.      . DULoxetine (CYMBALTA) 30 MG capsule Take 30 mg by mouth at bedtime.      Marland Kitchen etodolac (LODINE) 500 MG tablet Take 500 mg by mouth 2 (two) times daily.      Marland Kitchen FLUoxetine (PROZAC) 20 MG capsule Take 20 mg by mouth daily.      Marland Kitchen gabapentin (NEURONTIN) 800 MG tablet Take 800 mg by mouth 3 (three) times daily.      Marland Kitchen glipiZIDE (GLUCOTROL) 10 MG tablet Take 10 mg by mouth 2 (two) times daily before a meal.       . insulin aspart protamine-insulin aspart (NOVOLOG 70/30) (70-30) 100 UNIT/ML injection Inject 50 Units into the skin 2 (two) times daily with a meal.       . lisinopril (PRINIVIL,ZESTRIL) 20 MG tablet Take 20 mg by mouth daily.      . metFORMIN (GLUCOPHAGE) 1000 MG tablet Take 1,000 mg by mouth 2 (two) times daily with a meal.      . omeprazole (PRILOSEC) 20 MG capsule Take 20 mg by mouth daily.      . pravastatin (PRAVACHOL) 20 MG tablet Take 20 mg by mouth at bedtime.      Marland Kitchen rOPINIRole (REQUIP) 1 MG tablet Take 1 mg by mouth 3 (three) times daily.      Marland Kitchen tobramycin-dexamethasone (TOBRADEX) ophthalmic ointment Place 1 application into both eyes 4 (four) times  daily.      Marland Kitchen zolpidem (AMBIEN) 10 MG tablet Take 10 mg by mouth at bedtime.         ROS:                                                                                                                                       History obtained from the patient  General ROS: negative for - chills, fatigue, fever, night sweats, weight gain or weight loss Psychological ROS: negative for - behavioral disorder, hallucinations, memory difficulties, mood swings or suicidal ideation Ophthalmic ROS: negative for - blurry vision, double vision, eye pain or loss of vision ENT ROS: negative for - epistaxis, nasal discharge, oral lesions, sore throat, tinnitus or vertigo Allergy and Immunology ROS: negative for - hives or itchy/watery eyes Hematological and Lymphatic ROS: negative for - bleeding problems, bruising or swollen lymph nodes Endocrine ROS: negative for - galactorrhea, hair pattern changes, polydipsia/polyuria or temperature intolerance Respiratory ROS: negative for -  cough, hemoptysis, shortness of breath or wheezing Cardiovascular ROS: negative for - chest pain, dyspnea on exertion, edema or irregular heartbeat Gastrointestinal ROS: negative for - abdominal pain, diarrhea, hematemesis, nausea/vomiting or stool incontinence Genito-Urinary ROS: negative for - dysuria, hematuria, incontinence or urinary frequency/urgency Musculoskeletal ROS: negative for - joint swelling or muscular weakness Neurological ROS: as noted in HPI Dermatological ROS: negative for rash and skin lesion changes  Neurologic Examination:                                                                                                      Blood pressure 126/84, pulse 85, temperature 98.8 F (37.1 C), temperature source Oral, resp. rate 12, last menstrual period 10/07/2011, SpO2 100.00%.   Mental Status: Alert, oriented, thought content appropriate.  Speech fluent without evidence of aphasia.  Able to follow 3 step commands  without difficulty. Cranial Nerves: II: Discs flat bilaterally; Visual fields grossly normal, pupils equal, round, reactive to light and accommodation III,IV, VI: ptosis not present, extra-ocular motions intact bilaterally V,VII: smile symmetric, facial light touch sensation normal bilaterally VIII: hearing normal bilaterally IX,X: gag reflex present XI: bilateral shoulder shrug XII: midline tongue extension  Motor: Right : Upper extremity   4/5 (see note)   Left:     Upper extremity   5/5  Lower extremity   5/5      Lower extremity   5/5 --right grip and bicep is weaker then the left but still 4/5.  --very slow movements when asked to raise her arm and hand Tone and bulk:normal tone throughout; no atrophy noted Sensory: Pinprick and light touch intact throughout, bilaterally--but has subjective tingling as noted above Deep Tendon Reflexes: 2+ and symmetric throughout Plantars: Right: downgoing   Left: downgoing Cerebellar: normal finger-to-nose,  normal heel-to-shin test CV: pulses palpable throughout    Results for orders placed during the hospital encounter of 03/05/13 (from the past 48 hour(s))  GLUCOSE, CAPILLARY     Status: Abnormal   Collection Time    03/05/13  2:17 PM      Result Value Range   Glucose-Capillary 186 (*) 70 - 99 mg/dL  CBC     Status: Abnormal   Collection Time    03/05/13  2:32 PM      Result Value Range   WBC 9.4  4.0 - 10.5 K/uL   RBC 4.25  3.87 - 5.11 MIL/uL   Hemoglobin 11.4 (*) 12.0 - 15.0 g/dL   HCT 82.9 (*) 56.2 - 13.0 %   MCV 80.2  78.0 - 100.0 fL   MCH 26.8  26.0 - 34.0 pg   MCHC 33.4  30.0 - 36.0 g/dL   RDW 86.5  78.4 - 69.6 %   Platelets 313  150 - 400 K/uL  BASIC METABOLIC PANEL     Status: Abnormal   Collection Time    03/05/13  2:32 PM      Result Value Range   Sodium 137  135 - 145 mEq/L   Potassium 3.2 (*) 3.5 - 5.1 mEq/L   Chloride 100  96 -  112 mEq/L   CO2 26  19 - 32 mEq/L   Glucose, Bld 208 (*) 70 - 99 mg/dL   BUN 10  6  - 23 mg/dL   Creatinine, Ser 1.61  0.50 - 1.10 mg/dL   Calcium 09.6  8.4 - 04.5 mg/dL   GFR calc non Af Amer >90  >90 mL/min   GFR calc Af Amer >90  >90 mL/min   Comment:            The eGFR has been calculated     using the CKD EPI equation.     This calculation has not been     validated in all clinical     situations.     eGFR's persistently     <90 mL/min signify     possible Chronic Kidney Disease.  ETHANOL     Status: None   Collection Time    03/05/13  3:21 PM      Result Value Range   Alcohol, Ethyl (B) <11  0 - 11 mg/dL   Comment:            LOWEST DETECTABLE LIMIT FOR     SERUM ALCOHOL IS 11 mg/dL     FOR MEDICAL PURPOSES ONLY  PROTIME-INR     Status: None   Collection Time    03/05/13  3:21 PM      Result Value Range   Prothrombin Time 13.2  11.6 - 15.2 seconds   INR 1.01  0.00 - 1.49  APTT     Status: None   Collection Time    03/05/13  3:21 PM      Result Value Range   aPTT 31  24 - 37 seconds  DIFFERENTIAL     Status: None   Collection Time    03/05/13  3:21 PM      Result Value Range   Neutrophils Relative % 63  43 - 77 %   Neutro Abs 5.6  1.7 - 7.7 K/uL   Lymphocytes Relative 32  12 - 46 %   Lymphs Abs 2.8  0.7 - 4.0 K/uL   Monocytes Relative 4  3 - 12 %   Monocytes Absolute 0.4  0.1 - 1.0 K/uL   Eosinophils Relative 1  0 - 5 %   Eosinophils Absolute 0.1  0.0 - 0.7 K/uL   Basophils Relative 0  0 - 1 %   Basophils Absolute 0.0  0.0 - 0.1 K/uL  COMPREHENSIVE METABOLIC PANEL     Status: Abnormal   Collection Time    03/05/13  3:21 PM      Result Value Range   Sodium 139  135 - 145 mEq/L   Potassium 3.3 (*) 3.5 - 5.1 mEq/L   Chloride 101  96 - 112 mEq/L   CO2 26  19 - 32 mEq/L   Glucose, Bld 202 (*) 70 - 99 mg/dL   BUN 10  6 - 23 mg/dL   Creatinine, Ser 4.09  0.50 - 1.10 mg/dL   Calcium 9.9  8.4 - 81.1 mg/dL   Total Protein 7.9  6.0 - 8.3 g/dL   Albumin 3.8  3.5 - 5.2 g/dL   AST 50 (*) 0 - 37 U/L   ALT 46 (*) 0 - 35 U/L   Alkaline  Phosphatase 67  39 - 117 U/L   Total Bilirubin 0.3  0.3 - 1.2 mg/dL   GFR calc non Af Amer >90  >90 mL/min   GFR calc  Af Amer >90  >90 mL/min   Comment:            The eGFR has been calculated     using the CKD EPI equation.     This calculation has not been     validated in all clinical     situations.     eGFR's persistently     <90 mL/min signify     possible Chronic Kidney Disease.  TROPONIN I     Status: None   Collection Time    03/05/13  3:23 PM      Result Value Range   Troponin I <0.30  <0.30 ng/mL   Comment:            Due to the release kinetics of cTnI,     a negative result within the first hours     of the onset of symptoms does not rule out     myocardial infarction with certainty.     If myocardial infarction is still suspected,     repeat the test at appropriate intervals.  URINE RAPID DRUG SCREEN (HOSP PERFORMED)     Status: None   Collection Time    03/05/13  3:57 PM      Result Value Range   Opiates NONE DETECTED  NONE DETECTED   Cocaine NONE DETECTED  NONE DETECTED   Benzodiazepines NONE DETECTED  NONE DETECTED   Amphetamines NONE DETECTED  NONE DETECTED   Tetrahydrocannabinol NONE DETECTED  NONE DETECTED   Barbiturates NONE DETECTED  NONE DETECTED   Comment:            DRUG SCREEN FOR MEDICAL PURPOSES     ONLY.  IF CONFIRMATION IS NEEDED     FOR ANY PURPOSE, NOTIFY LAB     WITHIN 5 DAYS.                LOWEST DETECTABLE LIMITS     FOR URINE DRUG SCREEN     Drug Class       Cutoff (ng/mL)     Amphetamine      1000     Barbiturate      200     Benzodiazepine   200     Tricyclics       300     Opiates          300     Cocaine          300     THC              50  URINALYSIS, ROUTINE W REFLEX MICROSCOPIC     Status: Abnormal   Collection Time    03/05/13  3:58 PM      Result Value Range   Color, Urine YELLOW  YELLOW   APPearance HAZY (*) CLEAR   Specific Gravity, Urine 1.027  1.005 - 1.030   pH 5.5  5.0 - 8.0   Glucose, UA 500 (*) NEGATIVE  mg/dL   Hgb urine dipstick NEGATIVE  NEGATIVE   Bilirubin Urine SMALL (*) NEGATIVE   Ketones, ur NEGATIVE  NEGATIVE mg/dL   Protein, ur NEGATIVE  NEGATIVE mg/dL   Urobilinogen, UA 1.0  0.0 - 1.0 mg/dL   Nitrite NEGATIVE  NEGATIVE   Leukocytes, UA NEGATIVE  NEGATIVE   Comment: MICROSCOPIC NOT DONE ON URINES WITH NEGATIVE PROTEIN, BLOOD, LEUKOCYTES, NITRITE, OR GLUCOSE <1000 mg/dL.   Ct Head Wo Contrast  03/05/2013   *RADIOLOGY REPORT*  Clinical Data:  Headache.  Right arm weakness and numbness.  CT HEAD WITHOUT CONTRAST  Technique:  Contiguous axial images were obtained from the base of the skull through the vertex without contrast.  Comparison: MRI 04/25/2012.  Head CT 04/25/2012.  Findings: The brain has a normal appearance without evidence of malformation, atrophy, old or acute infarction, mass lesion, hemorrhage, hydrocephalus or extra-axial collection.  No calvarial abnormality.  Visualized sinuses, middle ears and mastoids are clear.  IMPRESSION: Normal head CT.   Original Report Authenticated By: Paulina Fusi, M.D.    Assessment and plan discussed with with attending physician and they are in agreement.    Felicie Morn PA-C [facility] [phone]  03/05/2013, 4:39 PM   Assessment: 42 y.o. female with right arm weakness and numbness in the setting of headache. Differential includes stroke, c-spine disease vs complicated migraine. At this time, I would advise MRI brain and C-Spine. If these are normal would proceed with treatment for complicated migraine.   Stroke Risk Factors - hyperlipidemia and hypertension, DM  Plan:  1. MRI brain and C-spine 2. Further recommendations pending above imaging  Ritta Slot, MD [facility] [phone]  If 7pm- 7am, please page neurology on call at [phone].

## 2013-03-05 NOTE — ED Notes (Signed)
Patient transported to MRI 

## 2013-03-05 NOTE — ED Provider Notes (Signed)
History     CSN: [id number]  Arrival date & time 03/05/13  1333   First MD Initiated Contact with Patient 03/05/13 1451      Chief Complaint  Patient presents with  . Headache    (Consider location/radiation/quality/duration/timing/severity/associated sxs/prior treatment) HPI This 42 year old female has a history of hypertension and diabetes with chronic pain in her neck back arms and legs all of which are stable, yesterday she had mild global headache all day without trauma without sudden onset without focal neurologic symptoms, she was last known well yesterday, this morning she woke up and realized that she had mild weakness and numbness to her right arm as well as some gait difficulty and is to walk with assistance and cannot walk alone due to ataxia with an intermittent sensation of vertigo without sudden headache without change in speech vision swallowing or understanding and without weakness or numbness to her left arm or left leg or her right leg only localized weakness and numbness to her right arm, there is no treatment prior to arrival no fever no cough no chest pain no shortness breath no abdominal pain no vomiting no bloody stools no lightheadedness no syncope. She is not a code stroke candidate to to onset of focal neurologic symptoms unknown sometime between yesterday and when she woke up this morning. She does not have a history of chronic headaches and has not had complicated migraines in the past. Past Medical History  Diagnosis Date  . Anemia   . GERD (gastroesophageal reflux disease)   . Depression   . Anxiety   . 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 Hypertension     maintained on lisinopril hct, metoprolol-134/86 at pat visit    Past Surgical History  Procedure Laterality Date  . Cesarean section  x3  . Abdominal hysterectomy  10/30/2011    Procedure: HYSTERECTOMY ABDOMINAL;  Surgeon:  Bing Plume, MD;  Location: [facility];  Service: Gynecology;  Laterality: N/A;  . Salpingoophorectomy  10/30/2011    Procedure: SALPINGO OOPHERECTOMY;  Surgeon: Bing Plume, MD;  Location: [facility];  Service: Gynecology;  Laterality: Right;    History reviewed. No pertinent family history.  History  Substance Use Topics  . Smoking status: Former Smoker    Quit date: 10/24/1997  . Smokeless tobacco: Not on file  . Alcohol Use: No    OB History   Grav Para Term Preterm Abortions TAB SAB Ect Mult Living                  Review of Systems 10 Systems reviewed and are negative for acute change except as noted in the HPI. Allergies  Codeine; Hydrocodone; and Percocet  Home Medications   Current Outpatient Rx  Name  Route  Sig  Dispense  Refill  . albuterol (PROVENTIL HFA;VENTOLIN HFA) 108 (90 BASE) MCG/ACT inhaler   Inhalation   Inhale 2 puffs into the lungs every 6 (six) hours as needed for wheezing or shortness of breath.          Marland Kitchen atenolol-chlorthalidone (TENORETIC) 100-25 MG per tablet   Oral   Take 1 tablet by mouth daily.         Marland Kitchen buPROPion (WELLBUTRIN XL) 150 MG 24 hr tablet   Oral   Take 150 mg by mouth daily.         . DULoxetine (CYMBALTA) 30 MG capsule   Oral  Take 30 mg by mouth at bedtime.         Marland Kitchen etodolac (LODINE) 500 MG tablet   Oral   Take 500 mg by mouth 2 (two) times daily.         Marland Kitchen FLUoxetine (PROZAC) 20 MG capsule   Oral   Take 20 mg by mouth daily.         Marland Kitchen gabapentin (NEURONTIN) 800 MG tablet   Oral   Take 800 mg by mouth 3 (three) times daily.         Marland Kitchen glipiZIDE (GLUCOTROL) 10 MG tablet   Oral   Take 10 mg by mouth 2 (two) times daily before a meal.          . insulin aspart protamine-insulin aspart (NOVOLOG 70/30) (70-30) 100 UNIT/ML injection   Subcutaneous   Inject 50 Units into the skin 2 (two) times daily with a meal.          . lisinopril (PRINIVIL,ZESTRIL) 20 MG tablet   Oral   Take 20 mg by mouth  daily.         . metFORMIN (GLUCOPHAGE) 1000 MG tablet   Oral   Take 1,000 mg by mouth 2 (two) times daily with a meal.         . omeprazole (PRILOSEC) 20 MG capsule   Oral   Take 20 mg by mouth daily.         . pravastatin (PRAVACHOL) 20 MG tablet   Oral   Take 20 mg by mouth at bedtime.         Marland Kitchen rOPINIRole (REQUIP) 1 MG tablet   Oral   Take 1 mg by mouth 3 (three) times daily.         Marland Kitchen tobramycin-dexamethasone (TOBRADEX) ophthalmic ointment   Both Eyes   Place 1 application into both eyes 4 (four) times daily.         Marland Kitchen zolpidem (AMBIEN) 10 MG tablet   Oral   Take 10 mg by mouth at bedtime.           BP 124/65  Pulse 75  Temp(Src) 98.1 F (36.7 C) (Oral)  Resp 17  SpO2 100%  LMP 10/07/2011  Physical Exam  Nursing note and vitals reviewed. Constitutional:  Awake, alert, nontoxic appearance with baseline speech for patient.  HENT:  Head: Atraumatic.  Mouth/Throat: No oropharyngeal exudate.  Eyes: EOM are normal. Pupils are equal, round, and reactive to light. Right eye exhibits no discharge. Left eye exhibits no discharge.  No nystagmus  Neck: Neck supple.  Cardiovascular: Normal rate and regular rhythm.   No murmur heard. Pulmonary/Chest: Effort normal and breath sounds normal. No stridor. No respiratory distress. She has no wheezes. She has no rales. She exhibits no tenderness.  Abdominal: Soft. Bowel sounds are normal. She exhibits no mass. There is no tenderness. There is no rebound.  Musculoskeletal: She exhibits no edema and no tenderness.  Baseline ROM, moves extremities with mild right arm obvious new focal weakness.  Lymphadenopathy:    She has no cervical adenopathy.  Neurological: She is alert.  Awake, alert, cooperative and aware of situation; motor strength bilaterally 5/5 legs, 5/5 left arm, but 4/5 right arm; sensation normal to light touch bilaterally legs and left arm, but decreased light touch right arm; peripheral visual fields  full to confrontation; no facial asymmetry; tongue midline; major cranial nerves appear intact; no pronator drift, normal finger to nose bilaterally, gait with new ataxia unable to walk  unassisted due to sensation of ataxia.  Skin: No rash noted.  Psychiatric: She has a normal mood and affect.    ED Course  Procedures (including critical care time) Seen by Neuro MR ordered by Neuro. 1640  ECG: Normal sinus rhythm, ventricular rate 97, normal axis, prolonged QT interval 462 ms QTC, nonspecific T wave abnormality, compared with March 2014 nonspecific T wave abnormality now present and QT has increased  Care endorsed to Dr. Oletta Lamas; Dispo pending. 1655  Labs Reviewed  CBC - Abnormal; Notable for the following:    Hemoglobin 11.4 (*)    HCT 34.1 (*)    All other components within normal limits  BASIC METABOLIC PANEL - Abnormal; Notable for the following:    Potassium 3.2 (*)    Glucose, Bld 208 (*)    All other components within normal limits  GLUCOSE, CAPILLARY - Abnormal; Notable for the following:    Glucose-Capillary 186 (*)    All other components within normal limits  COMPREHENSIVE METABOLIC PANEL - Abnormal; Notable for the following:    Potassium 3.3 (*)    Glucose, Bld 202 (*)    AST 50 (*)    ALT 46 (*)    All other components within normal limits  URINALYSIS, ROUTINE W REFLEX MICROSCOPIC - Abnormal; Notable for the following:    APPearance HAZY (*)    Glucose, UA 500 (*)    Bilirubin Urine SMALL (*)    All other components within normal limits  ETHANOL  PROTIME-INR  APTT  DIFFERENTIAL  TROPONIN I  URINE RAPID DRUG SCREEN (HOSP PERFORMED)   No results found.   1. Right arm weakness   2. Complicated migraine       MDM          Hurman Horn, MD 03/15/13 1555

## 2013-03-05 NOTE — ED Notes (Signed)
Pt reports that she started having a bad headache yesterday and woke this morning feeling bad. Pt reports weakness on the right side and numbness in her right arm that was there this morning. Pt is lethargic at triage. Pupils are equal and reactive. Pt alert to self and surrounding.

## 2013-03-05 NOTE — ED Notes (Signed)
Checked patient blood sugar it was 186 notified RN of blood sugar

## 2013-03-05 NOTE — ED Notes (Signed)
Patient transported to CT 

## 2013-03-07 NOTE — ED Provider Notes (Signed)
Received in sign out from Dr. Fonnie Jarvis.  Pt seen by Neurohospitalist.  Per their note, since MRI was neg for stroke, lesion, MS, etc, pt deemed most likely pain was related to atypical migraine and possibly related to prior chronic pain symptoms.  Pt encouraged to follow up with PMD, MRI results discussed with pt and family, they are relieved and are comfortable going home and following up.    Vanessa Pound. Aziya Arena, MD 03/07/13 1402

## 2013-03-10 ENCOUNTER — Ambulatory Visit: Payer: Medicaid Other | Attending: Anesthesiology | Admitting: Physical Therapy

## 2013-03-10 DIAGNOSIS — R269 Unspecified abnormalities of gait and mobility: Secondary | ICD-10-CM | POA: Insufficient documentation

## 2013-03-10 DIAGNOSIS — R262 Difficulty in walking, not elsewhere classified: Secondary | ICD-10-CM | POA: Insufficient documentation

## 2013-03-10 DIAGNOSIS — IMO0001 Reserved for inherently not codable concepts without codable children: Secondary | ICD-10-CM | POA: Insufficient documentation

## 2013-03-10 DIAGNOSIS — M545 Low back pain, unspecified: Secondary | ICD-10-CM | POA: Insufficient documentation

## 2013-03-22 ENCOUNTER — Ambulatory Visit: Payer: Medicaid Other | Attending: Anesthesiology | Admitting: Physical Therapy

## 2013-03-22 DIAGNOSIS — IMO0001 Reserved for inherently not codable concepts without codable children: Secondary | ICD-10-CM | POA: Insufficient documentation

## 2013-03-22 DIAGNOSIS — M62838 Other muscle spasm: Secondary | ICD-10-CM | POA: Insufficient documentation

## 2013-03-22 DIAGNOSIS — R262 Difficulty in walking, not elsewhere classified: Secondary | ICD-10-CM | POA: Insufficient documentation

## 2013-03-22 DIAGNOSIS — M545 Low back pain, unspecified: Secondary | ICD-10-CM | POA: Insufficient documentation

## 2013-03-22 DIAGNOSIS — R269 Unspecified abnormalities of gait and mobility: Secondary | ICD-10-CM | POA: Insufficient documentation

## 2013-04-05 ENCOUNTER — Ambulatory Visit: Payer: Medicaid Other | Admitting: Physical Therapy

## 2013-04-20 ENCOUNTER — Emergency Department (HOSPITAL_COMMUNITY): Payer: Medicaid Other

## 2013-04-20 ENCOUNTER — Encounter (HOSPITAL_COMMUNITY): Payer: Self-pay | Admitting: Emergency Medicine

## 2013-04-20 ENCOUNTER — Emergency Department (HOSPITAL_COMMUNITY)
Admission: EM | Admit: 2013-04-20 | Discharge: 2013-04-20 | Disposition: A | Discharge status: Home / Self Care | Payer: Medicaid Other | Attending: Emergency Medicine | Admitting: Emergency Medicine

## 2013-04-20 DIAGNOSIS — Z79899 Other long term (current) drug therapy: Secondary | ICD-10-CM | POA: Insufficient documentation

## 2013-04-20 DIAGNOSIS — R197 Diarrhea, unspecified: Secondary | ICD-10-CM | POA: Insufficient documentation

## 2013-04-20 DIAGNOSIS — R109 Unspecified abdominal pain: Secondary | ICD-10-CM

## 2013-04-20 DIAGNOSIS — E119 Type 2 diabetes mellitus without complications: Secondary | ICD-10-CM | POA: Insufficient documentation

## 2013-04-20 DIAGNOSIS — R000 Tachycardia, unspecified: Secondary | ICD-10-CM | POA: Insufficient documentation

## 2013-04-20 DIAGNOSIS — Z90710 Acquired absence of both cervix and uterus: Secondary | ICD-10-CM | POA: Insufficient documentation

## 2013-04-20 DIAGNOSIS — K219 Gastro-esophageal reflux disease without esophagitis: Secondary | ICD-10-CM | POA: Insufficient documentation

## 2013-04-20 DIAGNOSIS — R1032 Left lower quadrant pain: Secondary | ICD-10-CM | POA: Insufficient documentation

## 2013-04-20 DIAGNOSIS — Z862 Personal history of diseases of the blood and blood-forming organs and certain disorders involving the immune mechanism: Secondary | ICD-10-CM | POA: Insufficient documentation

## 2013-04-20 DIAGNOSIS — N949 Unspecified condition associated with female genital organs and menstrual cycle: Secondary | ICD-10-CM | POA: Insufficient documentation

## 2013-04-20 DIAGNOSIS — Z9889 Other specified postprocedural states: Secondary | ICD-10-CM | POA: Insufficient documentation

## 2013-04-20 DIAGNOSIS — I10 Essential (primary) hypertension: Secondary | ICD-10-CM | POA: Insufficient documentation

## 2013-04-20 DIAGNOSIS — R112 Nausea with vomiting, unspecified: Secondary | ICD-10-CM | POA: Insufficient documentation

## 2013-04-20 DIAGNOSIS — G589 Mononeuropathy, unspecified: Secondary | ICD-10-CM | POA: Insufficient documentation

## 2013-04-20 DIAGNOSIS — F411 Generalized anxiety disorder: Secondary | ICD-10-CM | POA: Insufficient documentation

## 2013-04-20 DIAGNOSIS — Z794 Long term (current) use of insulin: Secondary | ICD-10-CM | POA: Insufficient documentation

## 2013-04-20 DIAGNOSIS — Z87891 Personal history of nicotine dependence: Secondary | ICD-10-CM | POA: Insufficient documentation

## 2013-04-20 DIAGNOSIS — Z9079 Acquired absence of other genital organ(s): Secondary | ICD-10-CM | POA: Insufficient documentation

## 2013-04-20 DIAGNOSIS — F329 Major depressive disorder, single episode, unspecified: Secondary | ICD-10-CM | POA: Insufficient documentation

## 2013-04-20 DIAGNOSIS — F3289 Other specified depressive episodes: Secondary | ICD-10-CM | POA: Insufficient documentation

## 2013-04-20 HISTORY — DX: Polyneuropathy, unspecified: G62.9

## 2013-04-20 LAB — COMPREHENSIVE METABOLIC PANEL
Alkaline Phosphatase: 81 U/L (ref 39–117)
BUN: 14 mg/dL (ref 6–23)
Chloride: 99 mEq/L (ref 96–112)
Creatinine, Ser: 0.91 mg/dL (ref 0.50–1.10)
GFR calc Af Amer: >90 mL/min (ref >90)
Glucose, Bld: 108 mg/dL — ABNORMAL HIGH (ref 70–99)
Potassium: 4.3 mEq/L (ref 3.5–5.1)
Total Bilirubin: 0.2 mg/dL — ABNORMAL LOW (ref 0.3–1.2)

## 2013-04-20 LAB — POCT I-STAT, CHEM 8
BUN: 14 mg/dL (ref 6–23)
Calcium, Ion: 1.32 mmol/L — ABNORMAL HIGH (ref 1.12–1.23)
Chloride: 102 meq/L (ref 96–112)
Creatinine, Ser: 1 mg/dL (ref 0.50–1.10)
Glucose, Bld: 107 mg/dL — ABNORMAL HIGH (ref 70–99)
HCT: 40 % (ref 36.0–46.0)
Hemoglobin: 13.6 g/dL (ref 12.0–15.0)
Potassium: 4.3 meq/L (ref 3.5–5.1)
Sodium: 138 mEq/L (ref 135–145)
TCO2: 25 mmol/L (ref 0–100)

## 2013-04-20 LAB — URINALYSIS, ROUTINE W REFLEX MICROSCOPIC
Bilirubin Urine: NEGATIVE
Glucose, UA: 250 mg/dL — AB
Hgb urine dipstick: NEGATIVE
Ketones, ur: NEGATIVE mg/dL
Leukocytes, UA: NEGATIVE
Protein, ur: NEGATIVE mg/dL
pH: 5.5 (ref 5.0–8.0)

## 2013-04-20 LAB — CBC WITH DIFFERENTIAL/PLATELET
Basophils Relative: 0 % (ref 0–1)
Eosinophils Relative: 1 % (ref 0–5)
HCT: 35.9 % — ABNORMAL LOW (ref 36.0–46.0)
Hemoglobin: 12 g/dL (ref 12.0–15.0)
MCHC: 33.4 g/dL (ref 30.0–36.0)
MCV: 81.6 fL (ref 78.0–100.0)
Monocytes Absolute: 0.7 10*3/uL (ref 0.1–1.0)
Monocytes Relative: 5 % (ref 3–12)
Neutro Abs: 7.5 10*3/uL (ref 1.7–7.7)

## 2013-04-20 MED ORDER — HYDROMORPHONE HCL PF 1 MG/ML IJ SOLN
1.0000 mg | Freq: Once | INTRAMUSCULAR | Status: AC
  Administered 2013-04-20: 1 mg via INTRAVENOUS
  Filled 2013-04-20: qty 1

## 2013-04-20 MED ORDER — IOHEXOL 300 MG/ML  SOLN
100.0000 mL | Freq: Once | INTRAMUSCULAR | Status: AC | PRN
  Administered 2013-04-20: 100 mL via INTRAVENOUS

## 2013-04-20 MED ORDER — ONDANSETRON HCL 4 MG/2ML IJ SOLN
4.0000 mg | Freq: Once | INTRAMUSCULAR | Status: AC
  Administered 2013-04-20: 4 mg via INTRAVENOUS
  Filled 2013-04-20: qty 2

## 2013-04-20 MED ORDER — ONDANSETRON HCL 4 MG/2ML IJ SOLN
INTRAMUSCULAR | Status: AC
  Filled 2013-04-20: qty 2

## 2013-04-20 MED ORDER — SODIUM CHLORIDE 0.9 % IV BOLUS (SEPSIS)
1000.0000 mL | Freq: Once | INTRAVENOUS | Status: AC
  Administered 2013-04-20: 1000 mL via INTRAVENOUS

## 2013-04-20 MED ORDER — METRONIDAZOLE 500 MG PO TABS: 500.0000 mg | ORAL_TABLET | Freq: Two times a day (BID) | ORAL | Status: DC

## 2013-04-20 MED ORDER — ONDANSETRON 4 MG PO TBDP: ORAL_TABLET | ORAL | Status: DC

## 2013-04-20 MED ORDER — ONDANSETRON HCL 4 MG/2ML IJ SOLN
4.0000 mg | Freq: Once | INTRAMUSCULAR | Status: AC
  Administered 2013-04-20: 4 mg via INTRAVENOUS

## 2013-04-20 MED ORDER — IOHEXOL 300 MG/ML  SOLN
50.0000 mL | Freq: Once | INTRAMUSCULAR | Status: AC | PRN
  Administered 2013-04-20: 25 mL via ORAL

## 2013-04-20 NOTE — ED Notes (Signed)
Pt c/o left groin pain x 5 days worse today; pt tearful

## 2013-04-20 NOTE — ED Notes (Addendum)
Pt reports severe lower abdominal pain for the past two days. Pt states she saw her gynocologist yesterday and was prescribed medications including pain medications, medications for nausea and omeprazole. Pt states she coughed today and heard a pop and then had a severe burning sensation that has not subsided. Pt states this pain has consistent since 1430 today. Pt reports n/v/d since Thursday. Pt reports taking tramadol for pain this morning.

## 2013-04-20 NOTE — ED Notes (Signed)
Pt reports vertigo for the past week.

## 2013-04-20 NOTE — ED Provider Notes (Signed)
History    CSN: [id number] Arrival date & time 04/20/13  1540  First MD Initiated Contact with Patient 04/20/13 1625     Chief Complaint  Patient presents with  . Groin Pain   (Consider location/radiation/quality/duration/timing/severity/associated sxs/prior Treatment) HPI Comments: Pt w/ hx of hysterectomy w/ right oophrectomy and C section x 3, also hx of DM, HTN now w/ severe left adnexal pain. States 5 day hx of LLQ cramping. A/w n/v/d. Multiple episodes NB/NB emesis and diarrhea - 7x/day non bloody/non mucus. No recent travel or sick contact. No fever. Admits to diffuse abd pain a/w illness. Today pt coughed and noted acute onset severe LLQ pain, unrelenting, not exacerbated or relieved by anything. Denies vaginal bleeding or discharge. Denies dysuria/polyuria or hematuria. No fever.   Patient is a 42 y.o. female presenting with groin pain. The history is provided by the patient. No language interpreter was used.  Groin Pain This is a new problem. The current episode started today. The problem occurs constantly. The problem has been gradually worsening. Associated symptoms include abdominal pain, nausea and vomiting. Pertinent negatives include no chest pain, chills, congestion, coughing, fever, headaches, rash or sore throat. She has tried nothing for the symptoms. The treatment provided no relief.   Past Medical History  Diagnosis Date  . Anemia   . GERD (gastroesophageal reflux disease)   . Depression   . Anxiety   . 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 Hypertension     maintained on lisinopril hct, metoprolol-134/86 at pat visit  . Neuropathy    Past Surgical History  Procedure Laterality Date  . Cesarean section  x3  . Abdominal hysterectomy  10/30/2011    Procedure: HYSTERECTOMY ABDOMINAL;  Surgeon: Bing Plume, MD;  Location: [facility];  Service: Gynecology;  Laterality: N/A;  .  Salpingoophorectomy  10/30/2011    Procedure: SALPINGO OOPHERECTOMY;  Surgeon: Bing Plume, MD;  Location: [facility];  Service: Gynecology;  Laterality: Right;   History reviewed. No pertinent family history. History  Substance Use Topics  . Smoking status: Former Smoker    Quit date: 10/24/1997  . Smokeless tobacco: Not on file  . Alcohol Use: No   OB History   Grav Para Term Preterm Abortions TAB SAB Ect Mult Living                 Review of Systems  Constitutional: Negative for fever and chills.  HENT: Negative for congestion and sore throat.   Respiratory: Negative for cough and shortness of breath.   Cardiovascular: Negative for chest pain and leg swelling.  Gastrointestinal: Positive for nausea, vomiting, abdominal pain and diarrhea. Negative for constipation.  Genitourinary: Negative for dysuria and frequency.  Skin: Negative for color change and rash.  Neurological: Negative for dizziness and headaches.  Psychiatric/Behavioral: Negative for confusion and agitation.  All other systems reviewed and are negative.    Allergies  Codeine; Hydrocodone; and Percocet  Home Medications   Current Outpatient Rx  Name  Route  Sig  Dispense  Refill  . albuterol (PROVENTIL HFA;VENTOLIN HFA) 108 (90 BASE) MCG/ACT inhaler   Inhalation   Inhale 2 puffs into the lungs every 6 (six) hours as needed for wheezing or shortness of breath.          Marland Kitchen atenolol-chlorthalidone (TENORETIC) 100-25 MG per tablet   Oral   Take 1 tablet by mouth daily.         Marland Kitchen  buPROPion (WELLBUTRIN XL) 150 MG 24 hr tablet   Oral   Take 150 mg by mouth daily.         . DULoxetine (CYMBALTA) 30 MG capsule   Oral   Take 30 mg by mouth at bedtime.         Marland Kitchen etodolac (LODINE) 500 MG tablet   Oral   Take 500 mg by mouth 2 (two) times daily.         Marland Kitchen FLUoxetine (PROZAC) 20 MG capsule   Oral   Take 20 mg by mouth daily.         Marland Kitchen gabapentin (NEURONTIN) 800 MG tablet   Oral   Take 800 mg by  mouth 3 (three) times daily.         Marland Kitchen glipiZIDE (GLUCOTROL) 10 MG tablet   Oral   Take 10 mg by mouth 2 (two) times daily before a meal.          . insulin aspart protamine-insulin aspart (NOVOLOG 70/30) (70-30) 100 UNIT/ML injection   Subcutaneous   Inject 50 Units into the skin 2 (two) times daily with a meal.          . lisinopril (PRINIVIL,ZESTRIL) 20 MG tablet   Oral   Take 20 mg by mouth daily.         . metFORMIN (GLUCOPHAGE) 1000 MG tablet   Oral   Take 1,000 mg by mouth 2 (two) times daily with a meal.         . omeprazole (PRILOSEC) 20 MG capsule   Oral   Take 20 mg by mouth daily.         . pravastatin (PRAVACHOL) 20 MG tablet   Oral   Take 20 mg by mouth at bedtime.         Marland Kitchen rOPINIRole (REQUIP) 1 MG tablet   Oral   Take 1 mg by mouth 3 (three) times daily.         Marland Kitchen tobramycin-dexamethasone (TOBRADEX) ophthalmic ointment   Both Eyes   Place 1 application into both eyes 4 (four) times daily.         Marland Kitchen zolpidem (AMBIEN) 10 MG tablet   Oral   Take 10 mg by mouth at bedtime.          BP 119/75  Pulse 111  Temp(Src) 98 F (36.7 C) (Oral)  Resp 16  SpO2 100%  LMP 10/07/2011 Physical Exam  Vitals reviewed. Constitutional: She is oriented to person, place, and time. She appears well-developed and well-nourished. No distress.  HENT:  Head: Normocephalic and atraumatic.  Eyes: EOM are normal. Pupils are equal, round, and reactive to light.  Neck: Normal range of motion. Neck supple.  Cardiovascular: Normal rate and regular rhythm.   Pulmonary/Chest: Effort normal. No respiratory distress.  Abdominal: Soft. She exhibits no distension. There is tenderness in the left lower quadrant. There is guarding. There is no rigidity, no rebound, no CVA tenderness, no tenderness at McBurney's point and negative Murphy's sign. Hernia confirmed negative in the left inguinal area.    Genitourinary: Left adnexum displays tenderness. Left adnexum  displays no mass and no fullness.  Musculoskeletal: Normal range of motion. She exhibits no edema.  Neurological: She is alert and oriented to person, place, and time.  Skin: Skin is warm and dry.  Psychiatric: She has a normal mood and affect. Her behavior is normal.    ED Course  Procedures (including critical care time) Labs Reviewed  URINALYSIS, ROUTINE  W REFLEX MICROSCOPIC  CBC WITH DIFFERENTIAL  COMPREHENSIVE METABOLIC PANEL  LACTIC ACID, PLASMA   Results for orders placed during the hospital encounter of 04/20/13  WET PREP, GENITAL      Result Value Range   Yeast Wet Prep HPF POC NONE SEEN  NONE SEEN   Trich, Wet Prep NONE SEEN  NONE SEEN   Clue Cells Wet Prep HPF POC FEW (*) NONE SEEN   WBC, Wet Prep HPF POC FEW (*) NONE SEEN  URINALYSIS, ROUTINE W REFLEX MICROSCOPIC      Result Value Range   Color, Urine YELLOW  YELLOW   APPearance CLOUDY (*) CLEAR   Specific Gravity, Urine 1.015  1.005 - 1.030   pH 5.5  5.0 - 8.0   Glucose, UA 250 (*) NEGATIVE mg/dL   Hgb urine dipstick NEGATIVE  NEGATIVE   Bilirubin Urine NEGATIVE  NEGATIVE   Ketones, ur NEGATIVE  NEGATIVE mg/dL   Protein, ur NEGATIVE  NEGATIVE mg/dL   Urobilinogen, UA 0.2  0.0 - 1.0 mg/dL   Nitrite NEGATIVE  NEGATIVE   Leukocytes, UA NEGATIVE  NEGATIVE  CBC WITH DIFFERENTIAL      Result Value Range   WBC 12.7 (*) 4.0 - 10.5 K/uL   RBC 4.40  3.87 - 5.11 MIL/uL   Hemoglobin 12.0  12.0 - 15.0 g/dL   HCT 16.1 (*) 09.6 - 04.5 %   MCV 81.6  78.0 - 100.0 fL   MCH 27.3  26.0 - 34.0 pg   MCHC 33.4  30.0 - 36.0 g/dL   RDW 40.9  81.1 - 91.4 %   Platelets 459 (*) 150 - 400 K/uL   Neutrophils Relative % 59  43 - 77 %   Neutro Abs 7.5  1.7 - 7.7 K/uL   Lymphocytes Relative 35  12 - 46 %   Lymphs Abs 4.4 (*) 0.7 - 4.0 K/uL   Monocytes Relative 5  3 - 12 %   Monocytes Absolute 0.7  0.1 - 1.0 K/uL   Eosinophils Relative 1  0 - 5 %   Eosinophils Absolute 0.1  0.0 - 0.7 K/uL   Basophils Relative 0  0 - 1 %   Basophils  Absolute 0.1  0.0 - 0.1 K/uL  LACTIC ACID, PLASMA      Result Value Range   Lactic Acid, Venous 1.4  0.5 - 2.2 mmol/L  COMPREHENSIVE METABOLIC PANEL      Result Value Range   Sodium 135  135 - 145 mEq/L   Potassium 4.3  3.5 - 5.1 mEq/L   Chloride 99  96 - 112 mEq/L   CO2 25  19 - 32 mEq/L   Glucose, Bld 108 (*) 70 - 99 mg/dL   BUN 14  6 - 23 mg/dL   Creatinine, Ser 7.82  0.50 - 1.10 mg/dL   Calcium 95.6  8.4 - 21.3 mg/dL   Total Protein 8.3  6.0 - 8.3 g/dL   Albumin 4.0  3.5 - 5.2 g/dL   AST 55 (*) 0 - 37 U/L   ALT 51 (*) 0 - 35 U/L   Alkaline Phosphatase 81  39 - 117 U/L   Total Bilirubin 0.2 (*) 0.3 - 1.2 mg/dL   GFR calc non Af Amer 78 (*) >90 mL/min   GFR calc Af Amer >90  >90 mL/min  POCT I-STAT, CHEM 8      Result Value Range   Sodium 138  135 - 145 mEq/L   Potassium 4.3  3.5 - 5.1 mEq/L   Chloride 102  96 - 112 mEq/L   BUN 14  6 - 23 mg/dL   Creatinine, Ser 4.54  0.50 - 1.10 mg/dL   Glucose, Bld 098 (*) 70 - 99 mg/dL   Calcium, Ion 1.19 (*) 1.12 - 1.23 mmol/L   TCO2 25  0 - 100 mmol/L   Hemoglobin 13.6  12.0 - 15.0 g/dL   HCT 14.7  82.9 - 56.2 %    US Pelvis Complete (Final result)  Result time: 04/20/13 20:43:19    Final result by Rad Results In Interface (04/20/13 20:43:19)    Narrative:   *RADIOLOGY REPORT*  Clinical Data: Left adnexal pain. Previous hysterectomy and right salpingo-oophorectomy.  TRANSABDOMINAL AND TRANSVAGINAL ULTRASOUND OF PELVIS Technique: Both transabdominal and transvaginal ultrasound examinations of the pelvis were performed. Transabdominal technique was performed for global imaging of the pelvis including uterus, ovaries, adnexal regions, and pelvic cul-de-sac.  It was necessary to proceed with endovaginal exam following the transabdominal exam to visualize the left ovary in better detail.  Comparison: 05/22/2011 and pelvis CT obtained earlier today.  Findings:  Uterus: Surgically absent.  Endometrium: Surgically  absent.  Right ovary: Surgically absent.  Left ovary: Normal, measuring 4.4 x 3.6 x 2.0 cm. Normal internal blood flow with color Doppler.  Other findings: No free fluid  IMPRESSION: Normal examination, status post hysterectomy and right salpingo- oophorectomy.   Original Report Authenticated By: Beckie Salts, M.D.             US Transvaginal Non-OB (Final result)  Result time: 04/20/13 20:43:19    Final result by Rad Results In Interface (04/20/13 20:43:19)    Narrative:   *RADIOLOGY REPORT*  Clinical Data: Left adnexal pain. Previous hysterectomy and right salpingo-oophorectomy.  TRANSABDOMINAL AND TRANSVAGINAL ULTRASOUND OF PELVIS Technique: Both transabdominal and transvaginal ultrasound examinations of the pelvis were performed. Transabdominal technique was performed for global imaging of the pelvis including uterus, ovaries, adnexal regions, and pelvic cul-de-sac.  It was necessary to proceed with endovaginal exam following the transabdominal exam to visualize the left ovary in better detail.  Comparison: 05/22/2011 and pelvis CT obtained earlier today.  Findings:  Uterus: Surgically absent.  Endometrium: Surgically absent.  Right ovary: Surgically absent.  Left ovary: Normal, measuring 4.4 x 3.6 x 2.0 cm. Normal internal blood flow with color Doppler.  Other findings: No free fluid  IMPRESSION: Normal examination, status post hysterectomy and right salpingo- oophorectomy.   Original Report Authenticated By: Beckie Salts, M.D.             CT Abdomen Pelvis W Contrast (Final result)  Result time: 04/20/13 19:10:29    Final result by Rad Results In Interface (04/20/13 19:10:29)    Narrative:   *RADIOLOGY REPORT*  Clinical Data: Severe lower abdominal pain for 2 days.  CT ABDOMEN AND PELVIS WITH CONTRAST  Technique: Multidetector CT imaging of the abdomen and pelvis was performed following the standard protocol during  bolus administration of intravenous contrast.  Contrast: OMNIPAQUE IOHEXOL 300 MG/ML SOLN  Comparison: CT abdomen and pelvis 11/13/2011.  Findings: Lung bases are clear. No pleural or pericardial effusion.  As on the prior study, multiple stones are seen within the gallbladder but there is no CT evidence of cholecystitis. The adrenal glands, spleen, pancreas and kidneys appear normal. There is a new mixed attenuation lesion in the superior aspect of the caudate lobe measuring 2.0 x 1.6 cm on image 14. The liver is otherwise unremarkable. The stomach, small  and large bowel and appendix appear normal. No lymphadenopathy or fluid is identified. There is some stranding in the panniculus anterior to the pelvis which is likely postoperative in nature with the patient status post hysterectomy. No focal bony abnormality is identified.  IMPRESSION:  1. No source for infection is identified. 2. Gallstones without CT evidence of cholecystitis. 3. New, small mixed attenuation lesion in the caudate lobe cannot be characterized on this study. Non emergent MRI of the liver with contrast is recommended for further evaluation.   Original Report Authenticated By: Holley Dexter, M.D.         No results found. No diagnosis found.  MDM  Exam as above, pt crying in pain, afebrile, tachycardic, no resp distress or hypoxia. ttp left adnexa - no bleeding, no femoral hernia, guarding, no rebound or clinical peritonitis.  concern for perforated diverticulum vs adnexal cyst or torsion. Doubt PID, or hernia. CT obtained - no acute findings - cholelithiasis w/out cystitis. No perforation noted. Pelvic US - no cyst or torsion. Wet prep w/ clue cells - pt admits to odor and irritation. Will treat w/ flagyl.  All labs otherwise unremarkable. No UTI. WBC 12K - likely reactive from N/V/D. Given 1L IVF and dilaudid for pain and zofran for emesis. Sx likely 2/2 gastroenteritis and abd cramping. Doubt  acute infectious or surgical etiology. Stable for d/c home. Given rx for flagyl and zofran. FUP w/ PCP in am for repeat eval. D/c in good condition. Given return precautions.  I have personally reviewed labs and imaging and considered in my MDM. Case d/w Dr Oletta Lamas  1. Abdominal  pain, other specified site    Discharge Medication List as of 04/20/2013  8:54 PM    START taking these medications   Details  ondansetron (ZOFRAN ODT) 4 MG disintegrating tablet 4mg  ODT q4 hours prn nausea/vomit, Print       Jackie Plum, MD [address] [place] [address] [phone]  Schedule an appointment as soon as possible for a visit on 04/21/2013     Audelia Hives, MD 04/21/13 [phone]

## 2013-04-20 NOTE — ED Notes (Signed)
Patient transported to Ultrasound 

## 2013-04-20 NOTE — ED Notes (Addendum)
1920  Received report from off going RN and introduced self to the pt.  2045  Pt feeling less pain now than before.

## 2013-04-21 LAB — GC/CHLAMYDIA PROBE AMP
CT Probe RNA: NEGATIVE
GC Probe RNA: NEGATIVE

## 2013-04-21 NOTE — ED Provider Notes (Signed)
I saw and evaluated the patient, reviewed the resident's note and I agree with the findings and plan.  PT with rather sudden onset of severe left lower abd pain.  Tender on exam, pt was initially anxious, tearful, tachypneic due to pain presumably.  CT of abd/pelvis as well as U/S to assess for torsion or cyst were all essentially normal for acute process.  PT did improve with analgesics in the ED, discharged stably to follow up with PCP as outpt  Gavin Pound. Aliviyah Malanga, MD 04/21/13 1210

## 2013-05-26 ENCOUNTER — Encounter (HOSPITAL_COMMUNITY): Payer: Self-pay | Admitting: Adult Health

## 2013-05-26 DIAGNOSIS — R350 Frequency of micturition: Secondary | ICD-10-CM | POA: Insufficient documentation

## 2013-05-26 DIAGNOSIS — F329 Major depressive disorder, single episode, unspecified: Secondary | ICD-10-CM | POA: Insufficient documentation

## 2013-05-26 DIAGNOSIS — R300 Dysuria: Secondary | ICD-10-CM | POA: Insufficient documentation

## 2013-05-26 DIAGNOSIS — I10 Essential (primary) hypertension: Secondary | ICD-10-CM | POA: Insufficient documentation

## 2013-05-26 DIAGNOSIS — H538 Other visual disturbances: Secondary | ICD-10-CM | POA: Insufficient documentation

## 2013-05-26 DIAGNOSIS — G43909 Migraine, unspecified, not intractable, without status migrainosus: Secondary | ICD-10-CM | POA: Insufficient documentation

## 2013-05-26 DIAGNOSIS — K5900 Constipation, unspecified: Secondary | ICD-10-CM | POA: Insufficient documentation

## 2013-05-26 DIAGNOSIS — E119 Type 2 diabetes mellitus without complications: Secondary | ICD-10-CM | POA: Insufficient documentation

## 2013-05-26 DIAGNOSIS — R630 Anorexia: Secondary | ICD-10-CM | POA: Insufficient documentation

## 2013-05-26 DIAGNOSIS — Z87891 Personal history of nicotine dependence: Secondary | ICD-10-CM | POA: Insufficient documentation

## 2013-05-26 DIAGNOSIS — F3289 Other specified depressive episodes: Secondary | ICD-10-CM | POA: Insufficient documentation

## 2013-05-26 LAB — URINALYSIS, ROUTINE W REFLEX MICROSCOPIC
Glucose, UA: >1000 mg/dL — AB
Leukocytes, UA: NEGATIVE
Protein, ur: NEGATIVE mg/dL
Specific Gravity, Urine: 1.041 — ABNORMAL HIGH (ref 1.005–1.030)

## 2013-05-26 LAB — BASIC METABOLIC PANEL
CO2: 26 mEq/L (ref 19–32)
Chloride: 100 mEq/L (ref 96–112)
Sodium: 137 mEq/L (ref 135–145)

## 2013-05-26 LAB — CBC WITH DIFFERENTIAL/PLATELET
Basophils Absolute: 0 10*3/uL (ref 0.0–0.1)
HCT: 36.8 % (ref 36.0–46.0)
Lymphocytes Relative: 35 % (ref 12–46)
Monocytes Absolute: 0.5 10*3/uL (ref 0.1–1.0)
Neutro Abs: 7.5 10*3/uL (ref 1.7–7.7)
Platelets: 420 10*3/uL — ABNORMAL HIGH (ref 150–400)
RDW: 15.1 % (ref 11.5–15.5)
WBC: 12.6 10*3/uL — ABNORMAL HIGH (ref 4.0–10.5)

## 2013-05-26 LAB — URINE MICROSCOPIC-ADD ON

## 2013-05-26 NOTE — ED Notes (Signed)
Hx of shock test on May 13, 2013 at [facility] in [place] since the shock test endorses "pins and needles going up right arm, weakness in right arm, feeling depressed, blurred vision, fatigue, constipation (last BM 7/25), difficulty chewing, frequent urination, dysuria, slow to speak, loss of appetite, migraine headaches, inabilty to taste food and feeling hot" pt is neurilogiaclly intact, answers all questions appropraitely. Tried enema at home with no relief.

## 2013-05-27 ENCOUNTER — Emergency Department (HOSPITAL_COMMUNITY)
Admission: EM | Admit: 2013-05-27 | Discharge: 2013-05-27 | Discharge status: Against Medical Advice | Payer: Medicaid Other | Attending: Emergency Medicine | Admitting: Emergency Medicine

## 2013-05-27 NOTE — ED Notes (Signed)
Pt left without telling triage staff that she was leaving.

## 2013-06-27 ENCOUNTER — Emergency Department (HOSPITAL_COMMUNITY): Payer: Medicaid Other

## 2013-06-27 ENCOUNTER — Encounter (HOSPITAL_COMMUNITY): Payer: Self-pay | Admitting: *Deleted

## 2013-06-27 ENCOUNTER — Inpatient Hospital Stay (HOSPITAL_COMMUNITY)
Admission: EM | Admit: 2013-06-27 | Discharge: 2013-06-29 | DRG: 419 | Disposition: A | Discharge status: Home / Self Care | Payer: Medicaid Other | Attending: Surgery | Admitting: Surgery

## 2013-06-27 DIAGNOSIS — F411 Generalized anxiety disorder: Secondary | ICD-10-CM | POA: Diagnosis present

## 2013-06-27 DIAGNOSIS — I10 Essential (primary) hypertension: Secondary | ICD-10-CM | POA: Diagnosis present

## 2013-06-27 DIAGNOSIS — K8010 Calculus of gallbladder with chronic cholecystitis without obstruction: Secondary | ICD-10-CM | POA: Diagnosis present

## 2013-06-27 DIAGNOSIS — G589 Mononeuropathy, unspecified: Secondary | ICD-10-CM | POA: Diagnosis present

## 2013-06-27 DIAGNOSIS — K219 Gastro-esophageal reflux disease without esophagitis: Secondary | ICD-10-CM | POA: Diagnosis present

## 2013-06-27 DIAGNOSIS — K8020 Calculus of gallbladder without cholecystitis without obstruction: Secondary | ICD-10-CM

## 2013-06-27 DIAGNOSIS — F3289 Other specified depressive episodes: Secondary | ICD-10-CM | POA: Diagnosis present

## 2013-06-27 DIAGNOSIS — Z87891 Personal history of nicotine dependence: Secondary | ICD-10-CM

## 2013-06-27 DIAGNOSIS — R109 Unspecified abdominal pain: Secondary | ICD-10-CM

## 2013-06-27 DIAGNOSIS — E119 Type 2 diabetes mellitus without complications: Secondary | ICD-10-CM | POA: Diagnosis present

## 2013-06-27 DIAGNOSIS — Z79899 Other long term (current) drug therapy: Secondary | ICD-10-CM

## 2013-06-27 DIAGNOSIS — Z794 Long term (current) use of insulin: Secondary | ICD-10-CM

## 2013-06-27 DIAGNOSIS — F329 Major depressive disorder, single episode, unspecified: Secondary | ICD-10-CM | POA: Diagnosis present

## 2013-06-27 DIAGNOSIS — K8000 Calculus of gallbladder with acute cholecystitis without obstruction: Principal | ICD-10-CM | POA: Diagnosis present

## 2013-06-27 DIAGNOSIS — E669 Obesity, unspecified: Secondary | ICD-10-CM | POA: Diagnosis present

## 2013-06-27 LAB — COMPREHENSIVE METABOLIC PANEL
ALT: 42 U/L — ABNORMAL HIGH (ref 0–35)
AST: 34 U/L (ref 0–37)
Alkaline Phosphatase: 88 U/L (ref 39–117)
CO2: 24 mEq/L (ref 19–32)
GFR calc Af Amer: >90 mL/min (ref >90)
Glucose, Bld: 344 mg/dL — ABNORMAL HIGH (ref 70–99)
Potassium: 3.6 mEq/L (ref 3.5–5.1)
Sodium: 133 mEq/L — ABNORMAL LOW (ref 135–145)
Total Protein: 8.2 g/dL (ref 6.0–8.3)

## 2013-06-27 LAB — URINALYSIS, ROUTINE W REFLEX MICROSCOPIC
Bilirubin Urine: NEGATIVE
Glucose, UA: >1000 mg/dL — AB
Specific Gravity, Urine: 1.044 — ABNORMAL HIGH (ref 1.005–1.030)
Urobilinogen, UA: 0.2 mg/dL (ref 0.0–1.0)

## 2013-06-27 LAB — CBC WITH DIFFERENTIAL/PLATELET
Lymphocytes Relative: 32 % (ref 12–46)
Lymphs Abs: 3.1 10*3/uL (ref 0.7–4.0)
Neutrophils Relative %: 64 % (ref 43–77)
Platelets: 354 10*3/uL (ref 150–400)
RBC: 4.95 MIL/uL (ref 3.87–5.11)
WBC: 9.8 10*3/uL (ref 4.0–10.5)

## 2013-06-27 LAB — URINE MICROSCOPIC-ADD ON

## 2013-06-27 MED ORDER — SODIUM CHLORIDE 0.9 % IV SOLN
1000.0000 mL | INTRAVENOUS | Status: DC
  Administered 2013-06-27: 1000 mL via INTRAVENOUS

## 2013-06-27 MED ORDER — ONDANSETRON HCL 4 MG/2ML IJ SOLN
4.0000 mg | Freq: Once | INTRAMUSCULAR | Status: AC
  Administered 2013-06-27: 4 mg via INTRAVENOUS
  Filled 2013-06-27: qty 2

## 2013-06-27 MED ORDER — IOHEXOL 300 MG/ML  SOLN
100.0000 mL | Freq: Once | INTRAMUSCULAR | Status: AC | PRN
  Administered 2013-06-27: 100 mL via INTRAVENOUS

## 2013-06-27 MED ORDER — HYDROMORPHONE HCL PF 1 MG/ML IJ SOLN
1.0000 mg | Freq: Once | INTRAMUSCULAR | Status: AC
  Administered 2013-06-27: 1 mg via INTRAVENOUS
  Filled 2013-06-27: qty 1

## 2013-06-27 MED ORDER — SODIUM CHLORIDE 0.9 % IV SOLN
1000.0000 mL | Freq: Once | INTRAVENOUS | Status: AC
  Administered 2013-06-27: 1000 mL via INTRAVENOUS

## 2013-06-27 NOTE — ED Provider Notes (Signed)
CSN: [id number]     Arrival date & time 06/27/13  1533 History   First MD Initiated Contact with Patient 06/27/13 1821     Chief Complaint  Patient presents with  . Abdominal Pain   (Consider location/radiation/quality/duration/timing/severity/associated sxs/prior Treatment) HPI 42 year old female had onset 2 days ago of severe, crampy epigastric pain without radiation. She rated the pain at 10/10. Yesterday, she started having nausea, vomiting, diarrhea. She took ondansetron ODT with mild improvement of nausea. She continued to have diarrhea today and continues to have abdominal pain. She continues to have nausea but has not vomited today. Currently, she does not feel like she is going to have more diarrhea. She denies fever or chills. Nothing makes her pain better and nothing makes it worse. It is not affected by bowel movement or vomiting. She denies blood or mucus in the stool or emesis. Past Medical History  Diagnosis Date  . Anemia   . GERD (gastroesophageal reflux disease)   . Depression   . Anxiety   . 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 Hypertension     maintained on lisinopril hct, metoprolol-134/86 at pat visit  . Neuropathy    Past Surgical History  Procedure Laterality Date  . Cesarean section  x3  . Abdominal hysterectomy  10/30/2011    Procedure: HYSTERECTOMY ABDOMINAL;  Surgeon: Bing Plume, MD;  Location: [facility];  Service: Gynecology;  Laterality: N/A;  . Salpingoophorectomy  10/30/2011    Procedure: SALPINGO OOPHERECTOMY;  Surgeon: Bing Plume, MD;  Location: [facility];  Service: Gynecology;  Laterality: Right;   No family history on file. History  Substance Use Topics  . Smoking status: Former Smoker    Quit date: 10/24/1997  . Smokeless tobacco: Not on file  . Alcohol Use: No   OB History   Grav Para Term Preterm Abortions TAB SAB Ect Mult Living                 Review of  Systems  Allergies  Codeine; Hydrocodone; and Percocet  Home Medications   Current Outpatient Rx  Name  Route  Sig  Dispense  Refill  . albuterol (PROVENTIL HFA;VENTOLIN HFA) 108 (90 BASE) MCG/ACT inhaler   Inhalation   Inhale 2 puffs into the lungs every 6 (six) hours as needed for wheezing or shortness of breath.          Marland Kitchen amitriptyline (ELAVIL) 100 MG tablet   Oral   Take 100 mg by mouth at bedtime.         Marland Kitchen atenolol (TENORMIN) 100 MG tablet   Oral   Take 100 mg by mouth daily.         . Canagliflozin (INVOKANA) 100 MG TABS   Oral   Take 1 tablet by mouth daily.         . DULoxetine (CYMBALTA) 30 MG capsule   Oral   Take 30 mg by mouth at bedtime.         Marland Kitchen FLUoxetine (PROZAC) 40 MG capsule   Oral   Take 40 mg by mouth daily.         . Fluticasone-Salmeterol (ADVAIR) 250-50 MCG/DOSE AEPB   Inhalation   Inhale 1 puff into the lungs every 12 (twelve) hours.         Marland Kitchen glipiZIDE-metformin (METAGLIP) 5-500 MG per tablet   Oral   Take 1 tablet by mouth 2 (two)  times daily before a meal.         . hydrOXYzine (ATARAX/VISTARIL) 50 MG tablet   Oral   Take 50 mg by mouth 2 (two) times daily.         . insulin aspart protamine-insulin aspart (NOVOLOG 70/30) (70-30) 100 UNIT/ML injection   Subcutaneous   Inject 50 Units into the skin 2 (two) times daily with a meal.          . levETIRAcetam (KEPPRA) 750 MG tablet   Oral   Take 750 mg by mouth daily.         Marland Kitchen lisinopril (PRINIVIL,ZESTRIL) 20 MG tablet   Oral   Take 20 mg by mouth daily.         . mometasone (NASONEX) 50 MCG/ACT nasal spray   Nasal   Place 2 sprays into the nose daily.         . naproxen sodium (ANAPROX) 550 MG tablet   Oral   Take 550 mg by mouth 2 (two) times daily with a meal.         . omeprazole (PRILOSEC) 20 MG capsule   Oral   Take 20 mg by mouth daily.          . promethazine (PHENERGAN) 25 MG tablet   Oral   Take 25 mg by mouth every 6 (six) hours  as needed for nausea.         Marland Kitchen rOPINIRole (REQUIP) 3 MG tablet   Oral   Take 3 mg by mouth daily.         . traMADol (ULTRAM) 50 MG tablet   Oral   Take 50 mg by mouth every 6 (six) hours as needed for pain.         Marland Kitchen zolpidem (AMBIEN) 10 MG tablet   Oral   Take 10 mg by mouth at bedtime.          BP 132/88  Pulse 99  Temp(Src) 98 F (36.7 C) (Oral)  Resp 20  SpO2 100%  LMP 10/07/2011 Physical Exam 42 year old female, resting comfortably and in no acute distress. Vital signs are normal. Oxygen saturation is 100%, which is normal. Head is normocephalic and atraumatic. PERRLA, EOMI. Oropharynx is clear. Neck is nontender and supple without adenopathy or JVD. Back is nontender and there is no CVA tenderness. Lungs are clear without rales, wheezes, or rhonchi. Chest is nontender. Heart has regular rate and rhythm without murmur. Abdomen is soft, flat, with mild right lower quadrant tenderness, moderate right upper quadrant and epigastric tenderness. There is no rebound or guarding. There are no masses or hepatosplenomegaly and peristalsis is hypoactive but present. Extremities have no cyanosis or edema, full range of motion is present. Skin is warm and dry without rash. Neurologic: Mental status is normal, cranial nerves are intact, there are no motor or sensory deficits.  ED Course  Procedures (including critical care time) Labs Review Results for orders placed during the hospital encounter of 06/27/13  CBC WITH DIFFERENTIAL      Result Value Range   WBC 9.8  4.0 - 10.5 K/uL   RBC 4.95  3.87 - 5.11 MIL/uL   Hemoglobin 13.0  12.0 - 15.0 g/dL   HCT 78.4  69.6 - 29.5 %   MCV 79.6  78.0 - 100.0 fL   MCH 26.3  26.0 - 34.0 pg   MCHC 33.0  30.0 - 36.0 g/dL   RDW 28.4  13.2 - 44.0 %  Platelets 354  150 - 400 K/uL   Neutrophils Relative % 64  43 - 77 %   Neutro Abs 6.2  1.7 - 7.7 K/uL   Lymphocytes Relative 32  12 - 46 %   Lymphs Abs 3.1  0.7 - 4.0 K/uL   Monocytes  Relative 4  3 - 12 %   Monocytes Absolute 0.4  0.1 - 1.0 K/uL   Eosinophils Relative 1  0 - 5 %   Eosinophils Absolute 0.1  0.0 - 0.7 K/uL   Basophils Relative 0  0 - 1 %   Basophils Absolute 0.0  0.0 - 0.1 K/uL  COMPREHENSIVE METABOLIC PANEL      Result Value Range   Sodium 133 (*) 135 - 145 mEq/L   Potassium 3.6  3.5 - 5.1 mEq/L   Chloride 93 (*) 96 - 112 mEq/L   CO2 24  19 - 32 mEq/L   Glucose, Bld 344 (*) 70 - 99 mg/dL   BUN 8  6 - 23 mg/dL   Creatinine, Ser 1.61  0.50 - 1.10 mg/dL   Calcium 09.6  8.4 - 04.5 mg/dL   Total Protein 8.2  6.0 - 8.3 g/dL   Albumin 3.8  3.5 - 5.2 g/dL   AST 34  0 - 37 U/L   ALT 42 (*) 0 - 35 U/L   Alkaline Phosphatase 88  39 - 117 U/L   Total Bilirubin 0.2 (*) 0.3 - 1.2 mg/dL   GFR calc non Af Amer >90  >90 mL/min   GFR calc Af Amer >90  >90 mL/min  LIPASE, BLOOD      Result Value Range   Lipase 29  11 - 59 U/L  URINALYSIS, ROUTINE W REFLEX MICROSCOPIC      Result Value Range   Color, Urine YELLOW  YELLOW   APPearance TURBID (*) CLEAR   Specific Gravity, Urine 1.044 (*) 1.005 - 1.030   pH 5.5  5.0 - 8.0   Glucose, UA >1000 (*) NEGATIVE mg/dL   Hgb urine dipstick SMALL (*) NEGATIVE   Bilirubin Urine NEGATIVE  NEGATIVE   Ketones, ur 15 (*) NEGATIVE mg/dL   Protein, ur NEGATIVE  NEGATIVE mg/dL   Urobilinogen, UA 0.2  0.0 - 1.0 mg/dL   Nitrite NEGATIVE  NEGATIVE   Leukocytes, UA NEGATIVE  NEGATIVE  URINE MICROSCOPIC-ADD ON      Result Value Range   Squamous Epithelial / LPF RARE  RARE   RBC / HPF 0-2  <3 RBC/hpf   Urine-Other RARE YEAST     Imaging Review US Abdomen Complete  06/28/2013   *RADIOLOGY REPORT*  Clinical Data:  Right upper quadrant pain  ABDOMINAL ULTRASOUND COMPLETE  Comparison:  06/27/2013  Findings:  Gallbladder:  Multiple stones are identified measuring up to 2.2 cm.  There is no gallbladder wall thickening.  Positive sonographic Murphy's sign reported.  Common Bile Duct:  Measures 6 mm.  Liver: No focal mass lesion  identified.  The liver appears diffusely echogenic suggesting fatty infiltration.  IVC:  Appears normal.  Pancreas:  No abnormality identified.  Spleen:  Within normal limits in size and echotexture.  Right kidney:  Normal in size and parenchymal echogenicity.  No evidence of mass or hydronephrosis.  Left kidney:  Normal in size and parenchymal echogenicity.  No evidence of mass or hydronephrosis.  Abdominal Aorta:  No aneurysm identified.  IMPRESSION:  1.  Fatty infiltration of the liver. 2.  Gallstones with positive sonographic Murphy's sign. No gallbladder  wall thickening or pericholecystic fluid noted.   Original Report Authenticated By: Signa Kell, M.D.   Ct Abdomen Pelvis W Contrast  06/27/2013   *RADIOLOGY REPORT*  Clinical Data: Abdominal pain  CT ABDOMEN AND PELVIS WITH CONTRAST  Technique:  Multidetector CT imaging of the abdomen and pelvis was performed following the standard protocol during bolus administration of intravenous contrast.  Contrast: OMNIPAQUE IOHEXOL 300 MG/ML  SOLN  Comparison: 04/20/2013  Findings: The lung bases are clear.  No pleural or pericardial effusion.  Mild low attenuation within the liver parenchyma is identified.  No focal liver abnormality noted.  Multiple gallstones are identified.  There is no biliary dilatation noted.  Normal appearance of the pancreas.  The spleen is unremarkable.  The adrenal glands are both normal.  The right kidney is normal. The left kidney is also normal.  Urinary bladder is within normal limits.  Previous hysterectomy.  Normal caliber of the abdominal aorta.  No aneurysm.  No abdominal, pelvic or inguinal adenopathy identified.  There is no free fluid or fluid collections noted within the abdomen or pelvis.  The stomach appears normal.  The small bowel loops are within normal limits in caliber.  No obstruction.  The appendix is visualized and appears normal.  Normal appearance of the colon  Review of the visualized osseous structures is  unremarkable.  IMPRESSION:  1.  No acute findings within the abdomen or pelvis. 2.  The appendix is visualized and appears normal. 3.  Hepatic steatosis. 4.  Gallstones.   Original Report Authenticated By: Signa Kell, M.D.   Images viewed by me.  MDM   1. Abdominal pain   2. Cholelithiasis    Abdominal pain, vomiting, diarrhea. Symptoms complex seems consistent with viral gastroenteritis. However, she does have quite significant tenderness so she will be sent for CT scan to rule out more serious pathology. She'll be given ondansetron for nausea. She's given hydromorphone for pain. Old records are reviewed and she has several other ED visits for abdominal pain.  CT shows cholelithiasis. Patient is reexamined and tenderness now seems to be more localized in the upper abdomen and in the epigastric area. There is now a positive Murphy sign. I am suspicious that her abdominal pain may be a biliary colic and surgical consultation be obtained.  Case has been discussed with Dr. Dwain Sarna of surgery who requests ultrasound be obtained. Ultrasound has come back with positive sonographic Murphy sign but negative for pericholecystic fluid or gallbladder wall thickening. This has been relayed to Dr. Dwain Sarna who agrees to come and evaluate the patient for possible admission. At this point, I feel that her pain is most likely due to biliary colic.  Dione Booze, MD 06/28/13 [phone]

## 2013-06-27 NOTE — ED Notes (Signed)
Pt completed oral contrast. CT notified

## 2013-06-27 NOTE — ED Notes (Signed)
Patient transported to Ultrasound 

## 2013-06-27 NOTE — ED Notes (Signed)
Patient given oral contrast to begin drinking. Advised to hit call bell and alert when done with contrast.

## 2013-06-27 NOTE — ED Notes (Signed)
Awaiting provider. IV started.

## 2013-06-27 NOTE — ED Notes (Signed)
Patient returned from CT. Requesting pain medication.

## 2013-06-27 NOTE — ED Notes (Signed)
The pt is c/o abd pain since Friday.   nv diarrhea.  lmp hyst none

## 2013-06-28 ENCOUNTER — Encounter (HOSPITAL_COMMUNITY): Admission: EM | Disposition: A | Discharge status: Home / Self Care | Payer: Self-pay | Source: Home / Self Care

## 2013-06-28 ENCOUNTER — Inpatient Hospital Stay (HOSPITAL_COMMUNITY): Payer: Medicaid Other | Admitting: Certified Registered"

## 2013-06-28 ENCOUNTER — Encounter (HOSPITAL_COMMUNITY): Payer: Self-pay | Admitting: Certified Registered"

## 2013-06-28 DIAGNOSIS — K8000 Calculus of gallbladder with acute cholecystitis without obstruction: Secondary | ICD-10-CM

## 2013-06-28 DIAGNOSIS — K8010 Calculus of gallbladder with chronic cholecystitis without obstruction: Secondary | ICD-10-CM

## 2013-06-28 HISTORY — DX: Calculus of gallbladder with acute cholecystitis without obstruction: K80.00

## 2013-06-28 HISTORY — PX: CHOLECYSTECTOMY: SHX55

## 2013-06-28 LAB — CREATININE, SERUM: Creatinine, Ser: 0.61 mg/dL (ref 0.50–1.10)

## 2013-06-28 LAB — CBC
MCH: 26.3 pg (ref 26.0–34.0)
MCHC: 33.1 g/dL (ref 30.0–36.0)
Platelets: 341 10*3/uL (ref 150–400)

## 2013-06-28 LAB — GLUCOSE, CAPILLARY
Glucose-Capillary: 243 mg/dL — ABNORMAL HIGH (ref 70–99)
Glucose-Capillary: 275 mg/dL — ABNORMAL HIGH (ref 70–99)
Glucose-Capillary: 237 mg/dL — ABNORMAL HIGH (ref 70–99)
Glucose-Capillary: 228 mg/dL — ABNORMAL HIGH (ref 70–99)

## 2013-06-28 LAB — SURGICAL PCR SCREEN: MRSA, PCR: NEGATIVE

## 2013-06-28 SURGERY — LAPAROSCOPIC CHOLECYSTECTOMY
Anesthesia: General | Wound class: Contaminated

## 2013-06-28 MED ORDER — LACTATED RINGERS IV SOLN
INTRAVENOUS | Status: DC | PRN
  Administered 2013-06-28 (×2): via INTRAVENOUS

## 2013-06-28 MED ORDER — HEPARIN SODIUM (PORCINE) 5000 UNIT/ML IJ SOLN
5000.0000 [IU] | Freq: Three times a day (TID) | INTRAMUSCULAR | Status: DC
  Administered 2013-06-28 – 2013-06-29 (×2): 5000 [IU] via SUBCUTANEOUS
  Filled 2013-06-28 (×6): qty 1

## 2013-06-28 MED ORDER — SODIUM CHLORIDE 0.9 % IV SOLN
INTRAVENOUS | Status: DC
  Administered 2013-06-28 (×2): via INTRAVENOUS

## 2013-06-28 MED ORDER — ROPINIROLE HCL 1 MG PO TABS
3.0000 mg | ORAL_TABLET | Freq: Every day | ORAL | Status: DC
  Administered 2013-06-28: 3 mg via ORAL
  Filled 2013-06-28 (×2): qty 3

## 2013-06-28 MED ORDER — ZOLPIDEM TARTRATE 5 MG PO TABS: 5.0000 mg | ORAL_TABLET | Freq: Every evening | ORAL | Status: DC | PRN

## 2013-06-28 MED ORDER — SUCCINYLCHOLINE CHLORIDE 20 MG/ML IJ SOLN
INTRAMUSCULAR | Status: DC | PRN
  Administered 2013-06-28: 100 mg via INTRAVENOUS

## 2013-06-28 MED ORDER — PROPOFOL 10 MG/ML IV BOLUS
INTRAVENOUS | Status: DC | PRN
  Administered 2013-06-28: 140 mg via INTRAVENOUS
  Administered 2013-06-28: 60 mg via INTRAVENOUS

## 2013-06-28 MED ORDER — ACETAMINOPHEN 325 MG PO TABS: 650.0000 mg | ORAL_TABLET | Freq: Four times a day (QID) | ORAL | Status: DC | PRN

## 2013-06-28 MED ORDER — PROMETHAZINE HCL 25 MG/ML IJ SOLN
INTRAMUSCULAR | Status: AC
  Filled 2013-06-28: qty 1

## 2013-06-28 MED ORDER — 0.9 % SODIUM CHLORIDE (POUR BTL) OPTIME
TOPICAL | Status: DC | PRN
  Administered 2013-06-28: 1000 mL

## 2013-06-28 MED ORDER — ALBUTEROL SULFATE HFA 108 (90 BASE) MCG/ACT IN AERS: 2.0000 | INHALATION_SPRAY | Freq: Four times a day (QID) | RESPIRATORY_TRACT | Status: DC | PRN

## 2013-06-28 MED ORDER — MOMETASONE FURO-FORMOTEROL FUM 100-5 MCG/ACT IN AERO
2.0000 | INHALATION_SPRAY | Freq: Two times a day (BID) | RESPIRATORY_TRACT | Status: DC
  Administered 2013-06-28 – 2013-06-29 (×2): 2 via RESPIRATORY_TRACT
  Filled 2013-06-28: qty 8.8

## 2013-06-28 MED ORDER — SODIUM CHLORIDE 0.9 % IV SOLN
3.0000 g | Freq: Four times a day (QID) | INTRAVENOUS | Status: DC
  Administered 2013-06-28 – 2013-06-29 (×4): 3 g via INTRAVENOUS
  Filled 2013-06-28 (×8): qty 3

## 2013-06-28 MED ORDER — HYDROXYZINE HCL 50 MG PO TABS
50.0000 mg | ORAL_TABLET | Freq: Two times a day (BID) | ORAL | Status: DC
  Administered 2013-06-28: 50 mg via ORAL
  Filled 2013-06-28 (×3): qty 1

## 2013-06-28 MED ORDER — INSULIN ASPART 100 UNIT/ML ~~LOC~~ SOLN
0.0000 [IU] | Freq: Three times a day (TID) | SUBCUTANEOUS | Status: DC
  Administered 2013-06-28 – 2013-06-29 (×4): 7 [IU] via SUBCUTANEOUS

## 2013-06-28 MED ORDER — INSULIN ASPART 100 UNIT/ML ~~LOC~~ SOLN
8.0000 [IU] | Freq: Once | SUBCUTANEOUS | Status: AC
  Administered 2013-06-28: 8 [IU] via SUBCUTANEOUS

## 2013-06-28 MED ORDER — HYDROMORPHONE HCL PF 1 MG/ML IJ SOLN
INTRAMUSCULAR | Status: AC
  Filled 2013-06-28: qty 1

## 2013-06-28 MED ORDER — HYDROMORPHONE HCL PF 1 MG/ML IJ SOLN
0.2500 mg | INTRAMUSCULAR | Status: DC | PRN
  Administered 2013-06-28 (×4): 0.5 mg via INTRAVENOUS

## 2013-06-28 MED ORDER — ROCURONIUM BROMIDE 100 MG/10ML IV SOLN
INTRAVENOUS | Status: DC | PRN
  Administered 2013-06-28: 25 mg via INTRAVENOUS

## 2013-06-28 MED ORDER — BIOTENE DRY MOUTH MT LIQD
15.0000 mL | Freq: Two times a day (BID) | OROMUCOSAL | Status: DC
  Administered 2013-06-28 – 2013-06-29 (×2): 15 mL via OROMUCOSAL

## 2013-06-28 MED ORDER — NEOSTIGMINE METHYLSULFATE 1 MG/ML IJ SOLN
INTRAMUSCULAR | Status: DC | PRN
  Administered 2013-06-28: 3 mg via INTRAVENOUS

## 2013-06-28 MED ORDER — BUPIVACAINE-EPINEPHRINE PF 0.25-1:200000 % IJ SOLN
INTRAMUSCULAR | Status: AC
  Filled 2013-06-28: qty 30

## 2013-06-28 MED ORDER — AMITRIPTYLINE HCL 100 MG PO TABS
100.0000 mg | ORAL_TABLET | Freq: Every day | ORAL | Status: DC
  Administered 2013-06-28: 100 mg via ORAL
  Filled 2013-06-28 (×2): qty 1

## 2013-06-28 MED ORDER — INSULIN ASPART 100 UNIT/ML ~~LOC~~ SOLN
SUBCUTANEOUS | Status: AC
  Administered 2013-06-28: 7 [IU] via SUBCUTANEOUS
  Filled 2013-06-28: qty 1

## 2013-06-28 MED ORDER — ACETAMINOPHEN 650 MG RE SUPP: 650.0000 mg | Freq: Four times a day (QID) | RECTAL | Status: DC | PRN

## 2013-06-28 MED ORDER — ONDANSETRON HCL 4 MG/2ML IJ SOLN
4.0000 mg | Freq: Four times a day (QID) | INTRAMUSCULAR | Status: DC | PRN
  Administered 2013-06-28: 4 mg via INTRAVENOUS
  Filled 2013-06-28: qty 2

## 2013-06-28 MED ORDER — HYDROMORPHONE HCL PF 1 MG/ML IJ SOLN
1.0000 mg | INTRAMUSCULAR | Status: DC | PRN
  Administered 2013-06-28 (×2): 1 mg via INTRAVENOUS
  Filled 2013-06-28 (×2): qty 1

## 2013-06-28 MED ORDER — FENTANYL CITRATE 0.05 MG/ML IJ SOLN
INTRAMUSCULAR | Status: DC | PRN
  Administered 2013-06-28: 150 ug via INTRAVENOUS
  Administered 2013-06-28 (×2): 50 ug via INTRAVENOUS

## 2013-06-28 MED ORDER — METOPROLOL TARTRATE 1 MG/ML IV SOLN
INTRAVENOUS | Status: DC | PRN
  Administered 2013-06-28: 5 mg via INTRAVENOUS

## 2013-06-28 MED ORDER — FLUOXETINE HCL 20 MG PO CAPS
40.0000 mg | ORAL_CAPSULE | Freq: Every day | ORAL | Status: DC
  Administered 2013-06-28: 40 mg via ORAL
  Filled 2013-06-28 (×2): qty 2

## 2013-06-28 MED ORDER — BUPIVACAINE-EPINEPHRINE 0.25% -1:200000 IJ SOLN
INTRAMUSCULAR | Status: DC | PRN
  Administered 2013-06-28: 20 mL

## 2013-06-28 MED ORDER — HYDROMORPHONE HCL 2 MG PO TABS: 4.0000 mg | ORAL_TABLET | ORAL | Status: DC | PRN

## 2013-06-28 MED ORDER — KETOROLAC TROMETHAMINE 30 MG/ML IJ SOLN
INTRAMUSCULAR | Status: DC | PRN
  Administered 2013-06-28: 30 mg via INTRAVENOUS

## 2013-06-28 MED ORDER — ATENOLOL 100 MG PO TABS
100.0000 mg | ORAL_TABLET | Freq: Every day | ORAL | Status: DC
  Administered 2013-06-28: 100 mg via ORAL
  Filled 2013-06-28: qty 4
  Filled 2013-06-28: qty 1
  Filled 2013-06-28: qty 4

## 2013-06-28 MED ORDER — DULOXETINE HCL 30 MG PO CPEP
30.0000 mg | ORAL_CAPSULE | Freq: Every day | ORAL | Status: DC
  Administered 2013-06-28: 30 mg via ORAL
  Filled 2013-06-28 (×2): qty 1

## 2013-06-28 MED ORDER — PROMETHAZINE HCL 25 MG/ML IJ SOLN
6.2500 mg | Freq: Once | INTRAMUSCULAR | Status: AC
  Administered 2013-06-28: 6.25 mg via INTRAVENOUS

## 2013-06-28 MED ORDER — LIDOCAINE HCL (CARDIAC) 20 MG/ML IV SOLN
INTRAVENOUS | Status: DC | PRN
  Administered 2013-06-28: 50 mg via INTRAVENOUS

## 2013-06-28 MED ORDER — SODIUM CHLORIDE 0.9 % IR SOLN
Status: DC | PRN
  Administered 2013-06-28: 1000 mL

## 2013-06-28 MED ORDER — ONDANSETRON HCL 4 MG/2ML IJ SOLN
INTRAMUSCULAR | Status: DC | PRN
  Administered 2013-06-28: 4 mg via INTRAVENOUS

## 2013-06-28 MED ORDER — PANTOPRAZOLE SODIUM 40 MG IV SOLR
40.0000 mg | Freq: Every day | INTRAVENOUS | Status: DC
  Administered 2013-06-28: 40 mg via INTRAVENOUS
  Filled 2013-06-28 (×2): qty 40

## 2013-06-28 MED ORDER — ZOLPIDEM TARTRATE 5 MG PO TABS: 10.0000 mg | ORAL_TABLET | Freq: Every evening | ORAL | Status: DC | PRN

## 2013-06-28 MED ORDER — MOMETASONE FURO-FORMOTEROL FUM 100-5 MCG/ACT IN AERO: 2.0000 | INHALATION_SPRAY | Freq: Two times a day (BID) | RESPIRATORY_TRACT | Status: DC

## 2013-06-28 MED ORDER — HYDROMORPHONE HCL PF 1 MG/ML IJ SOLN
INTRAMUSCULAR | Status: AC
  Administered 2013-06-28: 1 mg via INTRAVENOUS
  Filled 2013-06-28: qty 1

## 2013-06-28 MED ORDER — MIDAZOLAM HCL 5 MG/5ML IJ SOLN
INTRAMUSCULAR | Status: DC | PRN
  Administered 2013-06-28: 2 mg via INTRAVENOUS

## 2013-06-28 MED ORDER — GLYCOPYRROLATE 0.2 MG/ML IJ SOLN
INTRAMUSCULAR | Status: DC | PRN
  Administered 2013-06-28: .5 mg via INTRAVENOUS

## 2013-06-28 MED ORDER — LISINOPRIL 20 MG PO TABS
20.0000 mg | ORAL_TABLET | Freq: Every day | ORAL | Status: DC
  Filled 2013-06-28 (×2): qty 1

## 2013-06-28 SURGICAL SUPPLY — 37 items
APL SKNCLS STERI-STRIP NONHPOA (GAUZE/BANDAGES/DRESSINGS) ×1
APPLIER CLIP 5 13 M/L LIGAMAX5 (MISCELLANEOUS) ×2
APR CLP MED LRG 5 ANG JAW (MISCELLANEOUS) ×1
BAG SPEC RTRVL LRG 6X4 10 (ENDOMECHANICALS)
BANDAGE ADHESIVE 1X3 (GAUZE/BANDAGES/DRESSINGS) ×8 IMPLANT
BENZOIN TINCTURE PRP APPL 2/3 (GAUZE/BANDAGES/DRESSINGS) ×2 IMPLANT
CANISTER SUCTION 2500CC (MISCELLANEOUS) ×2 IMPLANT
CHLORAPREP W/TINT 26ML (MISCELLANEOUS) ×2 IMPLANT
CLIP APPLIE 5 13 M/L LIGAMAX5 (MISCELLANEOUS) ×1 IMPLANT
CLOTH BEACON ORANGE TIMEOUT ST (SAFETY) ×2 IMPLANT
COVER MAYO STAND STRL (DRAPES) IMPLANT
COVER SURGICAL LIGHT HANDLE (MISCELLANEOUS) ×2 IMPLANT
DECANTER SPIKE VIAL GLASS SM (MISCELLANEOUS) ×2 IMPLANT
DRAPE C-ARM 42X72 X-RAY (DRAPES) IMPLANT
ELECT REM PT RETURN 9FT ADLT (ELECTROSURGICAL) ×2
ELECTRODE REM PT RTRN 9FT ADLT (ELECTROSURGICAL) ×1 IMPLANT
GLOVE SURG SIGNA 7.5 PF LTX (GLOVE) ×2 IMPLANT
GOWN STRL NON-REIN LRG LVL3 (GOWN DISPOSABLE) ×6 IMPLANT
GOWN STRL REIN XL XLG (GOWN DISPOSABLE) ×2 IMPLANT
KIT BASIN OR (CUSTOM PROCEDURE TRAY) ×2 IMPLANT
KIT ROOM TURNOVER OR (KITS) ×2 IMPLANT
NS IRRIG 1000ML POUR BTL (IV SOLUTION) ×2 IMPLANT
PAD ARMBOARD 7.5X6 YLW CONV (MISCELLANEOUS) ×2 IMPLANT
POUCH SPECIMEN RETRIEVAL 10MM (ENDOMECHANICALS) IMPLANT
SCISSORS LAP 5X35 DISP (ENDOMECHANICALS) IMPLANT
SET CHOLANGIOGRAPH 5 50 .035 (SET/KITS/TRAYS/PACK) IMPLANT
SET IRRIG TUBING LAPAROSCOPIC (IRRIGATION / IRRIGATOR) ×2 IMPLANT
SLEEVE ENDOPATH XCEL 5M (ENDOMECHANICALS) ×4 IMPLANT
SPECIMEN JAR SMALL (MISCELLANEOUS) ×2 IMPLANT
STRIP CLOSURE SKIN 1/2X4 (GAUZE/BANDAGES/DRESSINGS) ×1 IMPLANT
SUT MON AB 4-0 PC3 18 (SUTURE) ×2 IMPLANT
TOWEL OR 17X24 6PK STRL BLUE (TOWEL DISPOSABLE) ×2 IMPLANT
TOWEL OR 17X26 10 PK STRL BLUE (TOWEL DISPOSABLE) ×2 IMPLANT
TRAY LAPAROSCOPIC (CUSTOM PROCEDURE TRAY) ×2 IMPLANT
TROCAR XCEL BLUNT TIP 100MML (ENDOMECHANICALS) ×2 IMPLANT
TROCAR XCEL NON-BLD 5MMX100MML (ENDOMECHANICALS) ×2 IMPLANT
WATER STERILE IRR 1000ML POUR (IV SOLUTION) IMPLANT

## 2013-06-28 NOTE — Anesthesia Preprocedure Evaluation (Addendum)
Anesthesia Evaluation  Patient identified by MRN, date of birth, ID band Patient awake    Reviewed: Allergy & Precautions, H&P , NPO status , Patient's Chart, lab work & pertinent test results, reviewed documented beta blocker date and time   Airway Mallampati: III TM Distance: >3 FB Neck ROM: Full  Mouth opening: Limited Mouth Opening  Dental no notable dental hx. (+) Teeth Intact and Dental Advisory Given   Pulmonary neg pulmonary ROS,  breath sounds clear to auscultation  Pulmonary exam normal       Cardiovascular hypertension, On Medications, Pt. on medications and Pt. on home beta blockers Rhythm:Regular Rate:Normal     Neuro/Psych PSYCHIATRIC DISORDERS negative neurological ROS     GI/Hepatic Neg liver ROS, GERD-  Medicated and Controlled,  Endo/Other  diabetes, Type 1, Insulin DependentMorbid obesity  Renal/GU negative Renal ROS  negative genitourinary   Musculoskeletal   Abdominal   Peds  Hematology negative hematology ROS (+) anemia ,   Anesthesia Other Findings   Reproductive/Obstetrics negative OB ROS                         Anesthesia Physical Anesthesia Plan  ASA: III  Anesthesia Plan: General   Post-op Pain Management:    Induction: Intravenous  Airway Management Planned: Oral ETT and Video Laryngoscope Planned  Additional Equipment:   Intra-op Plan:   Post-operative Plan: Extubation in OR  Informed Consent: I have reviewed the patients History and Physical, chart, labs and discussed the procedure including the risks, benefits and alternatives for the proposed anesthesia with the patient or authorized representative who has indicated his/her understanding and acceptance.   Dental advisory given  Plan Discussed with: CRNA  Anesthesia Plan Comments:        Anesthesia Quick Evaluation

## 2013-06-28 NOTE — Progress Notes (Signed)
I have seen and examined the patient and agree with the assessment and plans.  Tillmon Kisling A. Anija Brickner  MD, FACS  

## 2013-06-28 NOTE — Evaluation (Signed)
Physical Therapy Evaluation and Discharge  Patient Details Name: Vanessa Case MRN: [id number] DOB: Apr 30, 1971 Today's Date: 06/28/2013 Time: 6213-0865 PT Time Calculation (min): 37 min  PT Assessment / Plan / Recommendation History of Present Illness  Pt adm to undergo laparoscopic cholecystectomy  Clinical Impression  Patient is s/p above surgery resulting in functional limitations due to the deficits listed below (see PT Problem List). Pt with complex history including vertigo/BPPV (asymptomatic today), chronic back pain, and bil LE neuropathy. She has been through PT before and understood all education provided.  No further skilled PT indicated.   PT Assessment  Patent does not need any further PT services    Follow Up Recommendations  No PT follow up;Supervision - Intermittent    Does the patient have the potential to tolerate intense rehabilitation      Barriers to Discharge        Equipment Recommendations  Other (comment) (discussed possible toilet riser (cannot afford BSC))    Recommendations for Other Services     Frequency      Precautions / Restrictions Restrictions Weight Bearing Restrictions: No   Pertinent Vitals/Pain 3/10 at abd prior to activity; "better" when completed      Mobility  Bed Mobility Bed Mobility: Right Sidelying to Sit;Sitting - Scoot to [place] of Bed;Rolling Right Rolling Right: 5: Supervision Right Sidelying to Sit: 4: Min guard;HOB flat Sitting - Scoot to Edge of Bed: 6: Modified independent (Device/Increase time) Details for Bed Mobility Assistance: vc for sequencing due to back pain; educated on using fists when pushing through hands to protect wrists Transfers Transfers: Sit to Stand;Stand to Sit Sit to Stand: 5: Supervision Stand to Sit: 5: Supervision Details for Transfer Assistance: pt able to demonstrate proper use of RW Ambulation/Gait Ambulation/Gait Assistance: 5: Supervision;6: Modified independent (Device/Increase  time) Ambulation Distance (Feet): 90 Feet Assistive device: Rolling walker Ambulation/Gait Assistance Details: pt with proper use of RW; initial supervision for safety, pt with no balance issues Gait Pattern: Step-through pattern;Decreased stride length Gait velocity: decr due to pain    Exercises     PT Diagnosis:    PT Problem List:   PT Treatment Interventions:       PT Goals(Current goals can be found in the care plan section)    Visit Information  Last PT Received On: 06/28/13 Assistance Needed: +1 History of Present Illness: Pt adm to undergo laparoscopic cholecystectomy       Prior Functioning  Home Living Family/patient expects to be discharged to:: Private residence Living Arrangements: Spouse/significant other;Children (4 yr old) Available Help at Discharge: Family (25 yr old son; friends) Type of Home: House Home Access: Stairs to enter Secretary/administrator of Steps: 6 Entrance Stairs-Rails: Right Home Layout: One level Home Equipment: Environmental consultant - 2 wheels;Shower seat;Crutches Prior Function Level of Independence: Independent with assistive device(s) Comments: baths and dresses in sitting Communication Communication: No difficulties    Cognition  Cognition Arousal/Alertness: Awake/alert Behavior During Therapy: WFL for tasks assessed/performed Overall Cognitive Status: Within Functional Limits for tasks assessed    Extremity/Trunk Assessment Upper Extremity Assessment Upper Extremity Assessment: RUE deficits/detail;LUE deficits/detail RUE Deficits / Details: strength grossly WFL; + h/o carpal tunnel  LUE Deficits / Details: strength grossly WFL; + h/o carpal tunnel and wears wrist splint Lower Extremity Assessment Lower Extremity Assessment: RLE deficits/detail;LLE deficits/detail RLE Sensation: history of peripheral neuropathy LLE Sensation: history of peripheral neuropathy   Balance    End of Session PT - End of Session Activity Tolerance: Patient  tolerated treatment well Patient left: in chair;with call bell/phone within reach;with family/visitor present Nurse Communication: Mobility status;Other (comment) (pt nauseous)  GP     Zell Doucette 06/28/2013, 4:43 PM Pager [phone]

## 2013-06-28 NOTE — Progress Notes (Signed)
Inpatient Diabetes Program Recommendations  AACE/ADA: New Consensus Statement on Inpatient Glycemic Control (2013)  Target Ranges:  Prepandial:   less than 140 mg/dL      Peak postprandial:   less than 180 mg/dL (1-2 hours)      Critically ill patients:  140 - 180 mg/dL   Results for LUNA, AUDIA (MRN [id number]) as of 06/28/2013 13:09  Ref. Range 03/15/2011 14:43  Hemoglobin A1C Latest Range: <5.7 % 9.6... (H)  Results for FRANCIS, YARDLEY (MRN [id number]) as of 06/28/2013 13:09  Ref. Range 06/28/2013 01:28 06/28/2013 08:03 06/28/2013 11:05 06/28/2013 12:37  Glucose-Capillary Latest Range: 70-99 mg/dL 213 (H) 086 (H) 578 (H) 237 (H)    Inpatient Diabetes Program Recommendations Insulin - Basal: Please consider ordering basal insulin; recommend half of home dose of 70/30 which would be 70/30 25 units BID and adjust accordingly.  If patient not eating well, may want to use Levemir 28 units daily while inpatient instead of 70/30. HgbA1C: Please order an A1C to determine glycemic control over the past 2-3 months.  Note: Patient has a history of diabetes and takes Metaglip 5-500 mg BID and 70/30 50 units BID as an outpatient for diabetes management.  Currently, patient is ordered to receive Novolog 0-20 units AC for inpatient glycemic control.  Clear liquid diet ordered at 10:16 today (post surgery).  Blood glucose has ranged from 193-275 mg/dl over the past 12 hours.  Patient will likely need to resume basal insulin to prevent further hyperglycemia.  Recommend ordering half of home dose of 70/30 if patient is eating; if not eating may want to use Levemir 28 units daily (equivalent to 70/30 25 units BID).  Also, please order an A1C.  Will continue to follow.  Thanks, Orlando Penner, RN, MSN, CCRN Diabetes Coordinator Inpatient Diabetes Program [phone]

## 2013-06-28 NOTE — Progress Notes (Signed)
Patient ID: Vanessa Case, female   DOB: 09-10-1971, 42 y.o.   MRN: [id number]  I plan a lap chole today I discussed this with Mrs. Pettus in detail.  I discussed the risks which include but are not limited to bleeding, infection, bile leak, bile duct injury, injury to other structures, need to convert to an open procedure, DVT, etc.  She understands and agrees to proceed.

## 2013-06-28 NOTE — Anesthesia Postprocedure Evaluation (Signed)
  Anesthesia Post-op Note  Patient: EVALEEN SANT  Procedure(s) Performed: Procedure(s): LAPAROSCOPIC CHOLECYSTECTOMY (N/A)  Patient Location: PACU  Anesthesia Type:General  Level of Consciousness: awake, alert  and oriented  Airway and Oxygen Therapy: Patient Spontanous Breathing and Patient connected to nasal cannula oxygen  Post-op Pain: mild  Post-op Assessment: Post-op Vital signs reviewed, Patient's Cardiovascular Status Stable, Respiratory Function Stable, Patent Airway and No signs of Nausea or vomiting  Post-op Vital Signs: Reviewed and stable  Complications: No apparent anesthesia complications

## 2013-06-28 NOTE — Progress Notes (Signed)
Subjective: Pt's pain well controlled.  No N/V.  Pain in RUQ/epigastrium.  Using IS and is at 2500.  Ambulating OOB.  Objective: Vital signs in last 24 hours: Temp:  [97.8 F (36.6 C)-98.6 F (37 C)] 98.4 F (36.9 C) (09/08 1610) Pulse Rate:  [78-105] 94 (09/08 0633) Resp:  [16-20] 19 (09/08 0633) BP: (123-149)/(75-95) 123/77 mmHg (09/08 0633) SpO2:  [99 %-100 %] 100 % (09/08 9604) Weight:  [217 lb 6 oz (98.6 kg)] 217 lb 6 oz (98.6 kg) (09/08 0229) Last BM Date: 06/27/13  Intake/Output from previous day:   Intake/Output this shift:    PE: Gen:  Alert, NAD, pleasant Abd: Obese, soft, ND, tender in RUQ/epigastrium, few BS, no HSM   Lab Results:   Recent Labs  06/27/13 1608 06/28/13 0550  WBC 9.8 10.1  HGB 13.0 11.9*  HCT 39.4 36.0  PLT 354 341   BMET  Recent Labs  06/27/13 1608 06/28/13 0550  NA 133*  --   K 3.6  --   CL 93*  --   CO2 24  --   GLUCOSE 344*  --   BUN 8  --   CREATININE 0.55 0.61  CALCIUM 10.3  --    PT/INR No results found for this basename: LABPROT, INR,  in the last 72 hours CMP     Component Value Date/Time   NA 133* 06/27/2013 1608   K 3.6 06/27/2013 1608   CL 93* 06/27/2013 1608   CO2 24 06/27/2013 1608   GLUCOSE 344* 06/27/2013 1608   BUN 8 06/27/2013 1608   CREATININE 0.61 06/28/2013 0550   CALCIUM 10.3 06/27/2013 1608   PROT 8.2 06/27/2013 1608   ALBUMIN 3.8 06/27/2013 1608   AST 34 06/27/2013 1608   ALT 42* 06/27/2013 1608   ALKPHOS 88 06/27/2013 1608   BILITOT 0.2* 06/27/2013 1608   GFRNONAA >90 06/28/2013 0550   GFRAA >90 06/28/2013 0550   Lipase     Component Value Date/Time   LIPASE 29 06/27/2013 1608       Studies/Results: US Abdomen Complete  06/28/2013   *RADIOLOGY REPORT*  Clinical Data:  Right upper quadrant pain  ABDOMINAL ULTRASOUND COMPLETE  Comparison:  06/27/2013  Findings:  Gallbladder:  Multiple stones are identified measuring up to 2.2 cm.  There is no gallbladder wall thickening.  Positive sonographic Murphy's sign  reported.  Common Bile Duct:  Measures 6 mm.  Liver: No focal mass lesion identified.  The liver appears diffusely echogenic suggesting fatty infiltration.  IVC:  Appears normal.  Pancreas:  No abnormality identified.  Spleen:  Within normal limits in size and echotexture.  Right kidney:  Normal in size and parenchymal echogenicity.  No evidence of mass or hydronephrosis.  Left kidney:  Normal in size and parenchymal echogenicity.  No evidence of mass or hydronephrosis.  Abdominal Aorta:  No aneurysm identified.  IMPRESSION:  1.  Fatty infiltration of the liver. 2.  Gallstones with positive sonographic Murphy's sign. No gallbladder wall thickening or pericholecystic fluid noted.   Original Report Authenticated By: Signa Kell, M.D.   Ct Abdomen Pelvis W Contrast  06/27/2013   *RADIOLOGY REPORT*  Clinical Data: Abdominal pain  CT ABDOMEN AND PELVIS WITH CONTRAST  Technique:  Multidetector CT imaging of the abdomen and pelvis was performed following the standard protocol during bolus administration of intravenous contrast.  Contrast: OMNIPAQUE IOHEXOL 300 MG/ML  SOLN  Comparison: 04/20/2013  Findings: The lung bases are clear.  No pleural or pericardial effusion.  Mild low attenuation within the liver parenchyma is identified.  No focal liver abnormality noted.  Multiple gallstones are identified.  There is no biliary dilatation noted.  Normal appearance of the pancreas.  The spleen is unremarkable.  The adrenal glands are both normal.  The right kidney is normal. The left kidney is also normal.  Urinary bladder is within normal limits.  Previous hysterectomy.  Normal caliber of the abdominal aorta.  No aneurysm.  No abdominal, pelvic or inguinal adenopathy identified.  There is no free fluid or fluid collections noted within the abdomen or pelvis.  The stomach appears normal.  The small bowel loops are within normal limits in caliber.  No obstruction.  The appendix is visualized and appears normal.  Normal  appearance of the colon  Review of the visualized osseous structures is unremarkable.  IMPRESSION:  1.  No acute findings within the abdomen or pelvis. 2.  The appendix is visualized and appears normal. 3.  Hepatic steatosis. 4.  Gallstones.   Original Report Authenticated By: Signa Kell, M.D.    Anti-infectives: Anti-infectives   Start     Dose/Rate Route Frequency Ordered Stop   06/28/13 0600  Ampicillin-Sulbactam (UNASYN) 3 g in sodium chloride 0.9 % 100 mL IVPB     3 g 100 mL/hr over 60 Minutes Intravenous Every 6 hours 06/28/13 0226         Assessment/Plan Cholecystitis/Biliary colic 1.  OR this morning 2.  IVF, pain control, antiemetics, antibiotics 3.  The patient understands the proposed surgery and wishes to proceed 4.  Ordered PT for after surgery (given she uses a walker at baseline) so that we can hopefully plan on discharge tomorrow morning.  Discussed this plan with the patient as well as the strategies to meeting all her goals prior to discharge.  She is agreeable to this and would like to go home tomorrow if possible.  Multiple chronic medical conditions:  Back on home meds after surgery    LOS: 1 day    DORT, Khambrel Amsden 06/28/2013, 7:44 AM Pager: [phone]

## 2013-06-28 NOTE — Op Note (Signed)
Laparoscopic Cholecystectomy Procedure Note  Indications: This patient presents with symptomatic gallbladder disease and will undergo laparoscopic cholecystectomy.  Pre-operative Diagnosis: Calculus of gallbladder with acute cholecystitis, without mention of obstruction  Post-operative Diagnosis: Same  Surgeon: Abigail Miyamoto A   Assistants: 0  Anesthesia: General endotracheal anesthesia  ASA Class: 2  Procedure Details  The patient was seen again in the Holding Room. The risks, benefits, complications, treatment options, and expected outcomes were discussed with the patient. The possibilities of reaction to medication, pulmonary aspiration, perforation of viscus, bleeding, recurrent infection, finding a normal gallbladder, the need for additional procedures, failure to diagnose a condition, the possible need to convert to an open procedure, and creating a complication requiring transfusion or operation were discussed with the patient. The likelihood of improving the patient's symptoms with return to their baseline status is good.  The patient and/or family concurred with the proposed plan, giving informed consent. The site of surgery properly noted. The patient was taken to Operating Room, identified as Vanessa Case and the procedure verified as Laparoscopic Cholecystectomy with Intraoperative Cholangiogram. A Time Out was held and the above information confirmed.  Prior to the induction of general anesthesia, antibiotic prophylaxis was administered. General endotracheal anesthesia was then administered and tolerated well. After the induction, the abdomen was prepped with Chloraprep and draped in sterile fashion. The patient was positioned in the supine position.  Local anesthetic agent was injected into the skin near the umbilicus and an incision made. We dissected down to the abdominal fascia with blunt dissection.  The fascia was incised vertically and we entered the peritoneal cavity  bluntly.  A pursestring suture of 0-Vicryl was placed around the fascial opening.  The Hasson cannula was inserted and secured with the stay suture.  Pneumoperitoneum was then created with CO2 and tolerated well without any adverse changes in the patient's vital signs. An 11-mm port was placed in the subxiphoid position.  Two 5-mm ports were placed in the right upper quadrant. All skin incisions were infiltrated with a local anesthetic agent before making the incision and placing the trocars.   We positioned the patient in reverse Trendelenburg, tilted slightly to the patient's left.  The gallbladder was identified, the fundus grasped and retracted cephalad. Adhesions were lysed bluntly and with the electrocautery where indicated, taking care not to injure any adjacent organs or viscus. The infundibulum was grasped and retracted laterally, exposing the peritoneum overlying the triangle of Calot. This was then divided and exposed in a blunt fashion. The cystic duct was clearly identified and bluntly dissected circumferentially. A critical view of the cystic duct and cystic artery was obtained.  The cystic duct was then ligated with clips and divided. The cystic artery was, dissected free, ligated with clips and divided as well.   The gallbladder was dissected from the liver bed in retrograde fashion with the electrocautery. The gallbladder was removed and placed in an Endocatch sac. The liver bed was irrigated and inspected. Hemostasis was achieved with the electrocautery. Copious irrigation was utilized and was repeatedly aspirated until clear.  The gallbladder and Endocatch sac were then removed through the umbilical port site.  The pursestring suture was used to close the umbilical fascia.    We again inspected the right upper quadrant for hemostasis.  Pneumoperitoneum was released as we removed the trocars.  4-0 Monocryl was used to close the skin.   Benzoin, steri-strips, and clean dressings were applied.  The patient was then extubated and brought to the recovery  room in stable condition. Instrument, sponge, and needle counts were correct at closure and at the conclusion of the case.   Findings: Cholecystitis with Cholelithiasis  Estimated Blood Loss: Minimal         Drains: 0         Specimens: Gallbladder           Complications: None; patient tolerated the procedure well.         Disposition: PACU - hemodynamically stable.         Condition: stable

## 2013-06-28 NOTE — Transfer of Care (Signed)
Immediate Anesthesia Transfer of Care Note  Patient: Vanessa Case  Procedure(s) Performed: Procedure(s): LAPAROSCOPIC CHOLECYSTECTOMY (N/A)  Patient Location: PACU  Anesthesia Type:General  Level of Consciousness: awake and alert   Airway & Oxygen Therapy: Patient Spontanous Breathing and Patient connected to nasal cannula oxygen  Post-op Assessment: Report given to PACU RN, Post -op Vital signs reviewed and stable and Patient moving all extremities  Post vital signs: Reviewed and stable  Complications: No apparent anesthesia complications

## 2013-06-28 NOTE — Anesthesia Procedure Notes (Signed)
Procedure Name: Intubation Date/Time: 06/28/2013 9:41 AM Performed by: Charm Barges, Lumina Gitto R Pre-anesthesia Checklist: Patient identified, Emergency Drugs available, Suction available, Patient being monitored and Timeout performed Patient Re-evaluated:Patient Re-evaluated prior to inductionOxygen Delivery Method: Circle system utilized Preoxygenation: Pre-oxygenation with 100% oxygen Intubation Type: IV induction Ventilation: Mask ventilation without difficulty Laryngoscope Size: Mac and 3 Grade View: Grade II Tube size: 7.5 mm Number of attempts: 1 Airway Equipment and Method: Stylet Placement Confirmation: ETT inserted through vocal cords under direct vision,  positive ETCO2 and breath sounds checked- equal and bilateral Secured at: 22 cm Tube secured with: Tape Dental Injury: Teeth and Oropharynx as per pre-operative assessment  Difficulty Due To: Difficult Airway- due to limited oral opening Future Recommendations: Recommend- induction with short-acting agent, and alternative techniques readily available

## 2013-06-28 NOTE — H&P (Signed)
Vanessa Case is an 42 y.o. female.   Chief Complaint: abdominal pain, consult from Dr Preston Fleeting HPI: 75 yof who has multiple medical problems including tachycardia (being seen by Dr Jacinto Halim for evaluation), dm (difficult to control she says), vertigo and back issues/neuropathy which lead her to need a walker who presents with new epigastric and ruq pain worse than any other time before that is not going away.  No relief at home. She has known gerd and treatment for gerd has not helped pain like in past.  She has nausea but no emesis.  Not really able to eat. This episode started after dinner Friday. She is also having diarrhea with it.  Past Medical History  Diagnosis Date  . Anemia   . GERD (gastroesophageal reflux disease)   . Depression   . Anxiety   . 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 Hypertension     maintained on lisinopril hct, metoprolol-134/86 at pat visit  . Neuropathy   OCD Chronic back pain Restless legs syndrome Tachycardia   Past Surgical History  Procedure Laterality Date  . Cesarean section  x3  . Abdominal hysterectomy  10/30/2011    Procedure: HYSTERECTOMY ABDOMINAL;  Surgeon: Bing Plume, MD;  Location: [facility];  Service: Gynecology;  Laterality: N/A;  . Salpingoophorectomy  10/30/2011    Procedure: SALPINGO OOPHERECTOMY;  Surgeon: Bing Plume, MD;  Location: [facility];  Service: Gynecology;  Laterality: Right;    No family history on file. Social History:  reports that she quit smoking about 15 years ago. She does not have any smokeless tobacco history on file. She reports that she does not drink alcohol. Her drug history is not on file.  Allergies:  Allergies  Allergen Reactions  . Codeine Nausea And Vomiting  . Hydrocodone Nausea And Vomiting  . Percocet [Oxycodone-Acetaminophen]     Hives     Current facility-administered medications:0.9 %  sodium chloride infusion, 1,000  mL, Intravenous, Continuous, Dione Booze, MD, 1,000 mL at 06/27/13 2011;  albuterol (PROVENTIL HFA;VENTOLIN HFA) 108 (90 BASE) MCG/ACT inhaler 2 puff, 2 puff, Inhalation, Q6H PRN, Emelia Loron, MD;  atenolol (TENORMIN) tablet 100 mg, 100 mg, Oral, Daily, Emelia Loron, MD;  lisinopril (PRINIVIL,ZESTRIL) tablet 20 mg, 20 mg, Oral, Daily, Emelia Loron, MD mometasone-formoterol [facility]) 100-5 MCG/ACT inhaler 2 puff, 2 puff, Inhalation, BID, Emelia Loron, MD Current outpatient prescriptions:albuterol (PROVENTIL HFA;VENTOLIN HFA) 108 (90 BASE) MCG/ACT inhaler, Inhale 2 puffs into the lungs every 6 (six) hours as needed for wheezing or shortness of breath. , Disp: , Rfl: ;  amitriptyline (ELAVIL) 100 MG tablet, Take 100 mg by mouth at bedtime., Disp: , Rfl: ;  atenolol (TENORMIN) 100 MG tablet, Take 100 mg by mouth daily., Disp: , Rfl:  Canagliflozin (INVOKANA) 100 MG TABS, Take 1 tablet by mouth daily., Disp: , Rfl: ;  DULoxetine (CYMBALTA) 30 MG capsule, Take 30 mg by mouth at bedtime., Disp: , Rfl: ;  FLUoxetine (PROZAC) 40 MG capsule, Take 40 mg by mouth daily., Disp: , Rfl: ;  Fluticasone-Salmeterol (ADVAIR) 250-50 MCG/DOSE AEPB, Inhale 1 puff into the lungs every 12 (twelve) hours., Disp: , Rfl:  glipiZIDE-metformin (METAGLIP) 5-500 MG per tablet, Take 1 tablet by mouth 2 (two) times daily before a meal., Disp: , Rfl: ;  hydrOXYzine (ATARAX/VISTARIL) 50 MG tablet, Take 50 mg by mouth 2 (two) times daily., Disp: , Rfl: ;  insulin aspart protamine-insulin  aspart (NOVOLOG 70/30) (70-30) 100 UNIT/ML injection, Inject 50 Units into the skin 2 (two) times daily with a meal. , Disp: , Rfl:  levETIRAcetam (KEPPRA) 750 MG tablet, Take 750 mg by mouth daily., Disp: , Rfl: ;  lisinopril (PRINIVIL,ZESTRIL) 20 MG tablet, Take 20 mg by mouth daily., Disp: , Rfl: ;  mometasone (NASONEX) 50 MCG/ACT nasal spray, Place 2 sprays into the nose daily., Disp: , Rfl: ;  naproxen sodium (ANAPROX) 550 MG tablet, Take  550 mg by mouth 2 (two) times daily with a meal., Disp: , Rfl:  omeprazole (PRILOSEC) 20 MG capsule, Take 20 mg by mouth daily. , Disp: , Rfl: ;  promethazine (PHENERGAN) 25 MG tablet, Take 25 mg by mouth every 6 (six) hours as needed for nausea., Disp: , Rfl: ;  rOPINIRole (REQUIP) 3 MG tablet, Take 3 mg by mouth daily., Disp: , Rfl: ;  traMADol (ULTRAM) 50 MG tablet, Take 50 mg by mouth every 6 (six) hours as needed for pain., Disp: , Rfl:  zolpidem (AMBIEN) 10 MG tablet, Take 10 mg by mouth at bedtime., Disp: , Rfl:    Results for orders placed during the hospital encounter of 06/27/13 (from the past 48 hour(s))  CBC WITH DIFFERENTIAL     Status: None   Collection Time    06/27/13  4:08 PM      Result Value Range   WBC 9.8  4.0 - 10.5 K/uL   RBC 4.95  3.87 - 5.11 MIL/uL   Hemoglobin 13.0  12.0 - 15.0 g/dL   HCT 16.1  09.6 - 04.5 %   MCV 79.6  78.0 - 100.0 fL   MCH 26.3  26.0 - 34.0 pg   MCHC 33.0  30.0 - 36.0 g/dL   RDW 40.9  81.1 - 91.4 %   Platelets 354  150 - 400 K/uL   Neutrophils Relative % 64  43 - 77 %   Neutro Abs 6.2  1.7 - 7.7 K/uL   Lymphocytes Relative 32  12 - 46 %   Lymphs Abs 3.1  0.7 - 4.0 K/uL   Monocytes Relative 4  3 - 12 %   Monocytes Absolute 0.4  0.1 - 1.0 K/uL   Eosinophils Relative 1  0 - 5 %   Eosinophils Absolute 0.1  0.0 - 0.7 K/uL   Basophils Relative 0  0 - 1 %   Basophils Absolute 0.0  0.0 - 0.1 K/uL  COMPREHENSIVE METABOLIC PANEL     Status: Abnormal   Collection Time    06/27/13  4:08 PM      Result Value Range   Sodium 133 (*) 135 - 145 mEq/L   Potassium 3.6  3.5 - 5.1 mEq/L   Chloride 93 (*) 96 - 112 mEq/L   CO2 24  19 - 32 mEq/L   Glucose, Bld 344 (*) 70 - 99 mg/dL   BUN 8  6 - 23 mg/dL   Creatinine, Ser 7.82  0.50 - 1.10 mg/dL   Calcium 95.6  8.4 - 21.3 mg/dL   Total Protein 8.2  6.0 - 8.3 g/dL   Albumin 3.8  3.5 - 5.2 g/dL   AST 34  0 - 37 U/L   ALT 42 (*) 0 - 35 U/L   Alkaline Phosphatase 88  39 - 117 U/L   Total Bilirubin 0.2 (*)  0.3 - 1.2 mg/dL   GFR calc non Af Amer >90  >90 mL/min   GFR calc Af Amer >90  >  90 mL/min   Comment: (NOTE)     The eGFR has been calculated using the CKD EPI equation.     This calculation has not been validated in all clinical situations.     eGFR's persistently <90 mL/min signify possible Chronic Kidney     Disease.  LIPASE, BLOOD     Status: None   Collection Time    06/27/13  4:08 PM      Result Value Range   Lipase 29  11 - 59 U/L  URINALYSIS, ROUTINE W REFLEX MICROSCOPIC     Status: Abnormal   Collection Time    06/27/13  5:28 PM      Result Value Range   Color, Urine YELLOW  YELLOW   APPearance TURBID (*) CLEAR   Specific Gravity, Urine 1.044 (*) 1.005 - 1.030   pH 5.5  5.0 - 8.0   Glucose, UA >1000 (*) NEGATIVE mg/dL   Hgb urine dipstick SMALL (*) NEGATIVE   Bilirubin Urine NEGATIVE  NEGATIVE   Ketones, ur 15 (*) NEGATIVE mg/dL   Protein, ur NEGATIVE  NEGATIVE mg/dL   Urobilinogen, UA 0.2  0.0 - 1.0 mg/dL   Nitrite NEGATIVE  NEGATIVE   Leukocytes, UA NEGATIVE  NEGATIVE  URINE MICROSCOPIC-ADD ON     Status: None   Collection Time    06/27/13  5:28 PM      Result Value Range   Squamous Epithelial / LPF RARE  RARE   RBC / HPF 0-2  <3 RBC/hpf   Urine-Other RARE YEAST     US Abdomen Complete  06/28/2013   *RADIOLOGY REPORT*  Clinical Data:  Right upper quadrant pain  ABDOMINAL ULTRASOUND COMPLETE  Comparison:  06/27/2013  Findings:  Gallbladder:  Multiple stones are identified measuring up to 2.2 cm.  There is no gallbladder wall thickening.  Positive sonographic Murphy's sign reported.  Common Bile Duct:  Measures 6 mm.  Liver: No focal mass lesion identified.  The liver appears diffusely echogenic suggesting fatty infiltration.  IVC:  Appears normal.  Pancreas:  No abnormality identified.  Spleen:  Within normal limits in size and echotexture.  Right kidney:  Normal in size and parenchymal echogenicity.  No evidence of mass or hydronephrosis.  Left kidney:  Normal in size  and parenchymal echogenicity.  No evidence of mass or hydronephrosis.  Abdominal Aorta:  No aneurysm identified.  IMPRESSION:  1.  Fatty infiltration of the liver. 2.  Gallstones with positive sonographic Murphy's sign. No gallbladder wall thickening or pericholecystic fluid noted.   Original Report Authenticated By: Signa Kell, M.D.   Ct Abdomen Pelvis W Contrast  06/27/2013   *RADIOLOGY REPORT*  Clinical Data: Abdominal pain  CT ABDOMEN AND PELVIS WITH CONTRAST  Technique:  Multidetector CT imaging of the abdomen and pelvis was performed following the standard protocol during bolus administration of intravenous contrast.  Contrast: OMNIPAQUE IOHEXOL 300 MG/ML  SOLN  Comparison: 04/20/2013  Findings: The lung bases are clear.  No pleural or pericardial effusion.  Mild low attenuation within the liver parenchyma is identified.  No focal liver abnormality noted.  Multiple gallstones are identified.  There is no biliary dilatation noted.  Normal appearance of the pancreas.  The spleen is unremarkable.  The adrenal glands are both normal.  The right kidney is normal. The left kidney is also normal.  Urinary bladder is within normal limits.  Previous hysterectomy.  Normal caliber of the abdominal aorta.  No aneurysm.  No abdominal, pelvic or inguinal adenopathy  identified.  There is no free fluid or fluid collections noted within the abdomen or pelvis.  The stomach appears normal.  The small bowel loops are within normal limits in caliber.  No obstruction.  The appendix is visualized and appears normal.  Normal appearance of the colon  Review of the visualized osseous structures is unremarkable.  IMPRESSION:  1.  No acute findings within the abdomen or pelvis. 2.  The appendix is visualized and appears normal. 3.  Hepatic steatosis. 4.  Gallstones.   Original Report Authenticated By: Signa Kell, M.D.    Review of Systems  Constitutional: Positive for malaise/fatigue. Negative for fever and chills.   Respiratory: Negative for shortness of breath and wheezing.   Cardiovascular: Negative for chest pain.  Gastrointestinal: Positive for nausea, abdominal pain and diarrhea. Negative for vomiting and blood in stool.  Musculoskeletal: Positive for back pain.  Neurological: Positive for headaches.  Psychiatric/Behavioral: The patient is nervous/anxious.     Blood pressure 133/88, pulse 86, temperature 98 F (36.7 C), temperature source Oral, resp. rate 16, last menstrual period 10/07/2011, SpO2 100.00%. Physical Exam  Vitals reviewed. Constitutional: She is oriented to person, place, and time. She appears well-developed and well-nourished.  Eyes: No scleral icterus.  Neck: Neck supple.  Cardiovascular: Normal rate, regular rhythm, normal heart sounds and intact distal pulses.   Respiratory: Effort normal and breath sounds normal. She has no wheezes. She has no rales.  GI: Soft. Bowel sounds are normal. There is tenderness in the right upper quadrant. There is positive Murphy's sign. No hernia.  Lymphadenopathy:    She has no cervical adenopathy.  Neurological: She is alert and oriented to person, place, and time.     Assessment/Plan Cholecystitis  I think she has cholecystitis on exam although wbc nl and temp nl.  She has pain for 48 hours, exam and u/s consistent with that though.  I recommended admission, npo, iv abx and plan for lap chole during the day today. She understands she will meet one of my partners.  I will place on insulin sliding scale for diabetes. I discussed the procedure in detail.We discussed the risks and benefits of a laparoscopic cholecystectomy and possible cholangiogram. The likelihood of improvement in symptoms and return to the patient's normal status is good. We discussed the typical post-operative recovery course.   Aline Wesche 06/28/2013, 1:26 AM

## 2013-06-28 NOTE — Progress Notes (Signed)
Nutrition Brief Note  Patient identified on the Malnutrition Screening Tool (MST) Report for recent weight lost without trying and eating poorly because of a decreased appetite.  Per weight readings, patient's weight has been trending up.  Wt Readings from Last 15 Encounters:  06/28/13 217 lb 6 oz (98.6 kg)  06/28/13 217 lb 6 oz (98.6 kg)  12/29/12 213 lb (96.616 kg)  10/30/11 211 lb (95.709 kg)  10/30/11 211 lb (95.709 kg)  10/25/11 211 lb (95.709 kg)    Body mass index is 36.17 kg/(m^2). Patient meets criteria for Obesity Class II based on current BMI.   Current diet order is Clear Liquids; s/p laparoscopic cholecystectomy. Labs and medications reviewed.   No nutrition interventions warranted at this time. If nutrition issues arise, please consult RD.   Maureen Chatters, RD, LDN Pager #: [phone] After-Hours Pager #: [phone]

## 2013-06-29 ENCOUNTER — Encounter (HOSPITAL_COMMUNITY): Payer: Self-pay | Admitting: Surgery

## 2013-06-29 LAB — GLUCOSE, CAPILLARY: Glucose-Capillary: 233 mg/dL — ABNORMAL HIGH (ref 70–99)

## 2013-06-29 MED ORDER — TRAMADOL HCL 50 MG PO TABS: 50.0000 mg | ORAL_TABLET | Freq: Four times a day (QID) | ORAL | Status: DC | PRN

## 2013-06-29 MED ORDER — TRAMADOL HCL 50 MG PO TABS
50.0000 mg | ORAL_TABLET | Freq: Four times a day (QID) | ORAL | Status: DC | PRN
  Administered 2013-06-29: 100 mg via ORAL
  Filled 2013-06-29: qty 2

## 2013-06-29 NOTE — Discharge Summary (Signed)
Physician Discharge Summary  Patient ID: Vanessa Case MRN: [id number] DOB/AGE: 1971-02-15 42 y.o.  Admit date: 06/27/2013 Discharge date: 06/29/2013  Admitting Diagnosis: Cholelithiasis with Acute cholecystitis  Discharge Diagnosis Patient Active Problem List   Diagnosis Date Noted  . Cholelithiasis with acute cholecystitis 06/28/2013  . Right arm weakness 03/05/2013    Consultants None  Imaging: US Abdomen Complete  06/28/2013   *RADIOLOGY REPORT*  Clinical Data:  Right upper quadrant pain  ABDOMINAL ULTRASOUND COMPLETE  Comparison:  06/27/2013  Findings:  Gallbladder:  Multiple stones are identified measuring up to 2.2 cm.  There is no gallbladder wall thickening.  Positive sonographic Murphy's sign reported.  Common Bile Duct:  Measures 6 mm.  Liver: No focal mass lesion identified.  The liver appears diffusely echogenic suggesting fatty infiltration.  IVC:  Appears normal.  Pancreas:  No abnormality identified.  Spleen:  Within normal limits in size and echotexture.  Right kidney:  Normal in size and parenchymal echogenicity.  No evidence of mass or hydronephrosis.  Left kidney:  Normal in size and parenchymal echogenicity.  No evidence of mass or hydronephrosis.  Abdominal Aorta:  No aneurysm identified.  IMPRESSION:  1.  Fatty infiltration of the liver. 2.  Gallstones with positive sonographic Murphy's sign. No gallbladder wall thickening or pericholecystic fluid noted.   Original Report Authenticated By: Signa Kell, M.D.   Ct Abdomen Pelvis W Contrast  06/27/2013   *RADIOLOGY REPORT*  Clinical Data: Abdominal pain  CT ABDOMEN AND PELVIS WITH CONTRAST  Technique:  Multidetector CT imaging of the abdomen and pelvis was performed following the standard protocol during bolus administration of intravenous contrast.  Contrast: OMNIPAQUE IOHEXOL 300 MG/ML  SOLN  Comparison: 04/20/2013  Findings: The lung bases are clear.  No pleural or pericardial effusion.  Mild low attenuation  within the liver parenchyma is identified.  No focal liver abnormality noted.  Multiple gallstones are identified.  There is no biliary dilatation noted.  Normal appearance of the pancreas.  The spleen is unremarkable.  The adrenal glands are both normal.  The right kidney is normal. The left kidney is also normal.  Urinary bladder is within normal limits.  Previous hysterectomy.  Normal caliber of the abdominal aorta.  No aneurysm.  No abdominal, pelvic or inguinal adenopathy identified.  There is no free fluid or fluid collections noted within the abdomen or pelvis.  The stomach appears normal.  The small bowel loops are within normal limits in caliber.  No obstruction.  The appendix is visualized and appears normal.  Normal appearance of the colon  Review of the visualized osseous structures is unremarkable.  IMPRESSION:  1.  No acute findings within the abdomen or pelvis. 2.  The appendix is visualized and appears normal. 3.  Hepatic steatosis. 4.  Gallstones.   Original Report Authenticated By: Signa Kell, M.D.    Procedures Dr. Magnus Ivan (06/28/13) - Laparoscopic Cholecystectomy with [facility]  Hospital Course:  12 yof who has multiple medical problems including tachycardia (being seen by Dr Jacinto Halim for evaluation), DM (difficult to control she says), vertigo and back issues/neuropathy which lead her to need a walker who presents with new epigastric and ruq pain worse than any other time before that is not going away. No relief at home. She has known GERD and treatment for GERD has not helped pain like in past. She has nausea but no emesis. Not really able to eat. This episode started after dinner Friday. She is also having diarrhea with it.  Workup showed evidence of cholecystitis on exam and gallstones with a +sonographic murphys sign.  All labs were grossly normal.  Patient was admitted and underwent procedure listed above.  Tolerated procedure well and was transferred to the floor.  Diet was advanced as  tolerated.  On POD #1, the patient was voiding well, tolerating diet, ambulating well, pain well controlled, vital signs stable, incisions c/d/i and felt stable for discharge home.  Patient will follow up in our office in 2 weeks and knows to call with questions or concerns.  Physical Exam: General:  Alert, NAD, pleasant, comfortable Abd:  Obese, soft, ND, mild tenderness, incisions C/D/I    Medication List         albuterol 108 (90 BASE) MCG/ACT inhaler  Commonly known as:  PROVENTIL HFA;VENTOLIN HFA  Inhale 2 puffs into the lungs every 6 (six) hours as needed for wheezing or shortness of breath.     amitriptyline 100 MG tablet  Commonly known as:  ELAVIL  Take 100 mg by mouth at bedtime.     atenolol 100 MG tablet  Commonly known as:  TENORMIN  Take 100 mg by mouth daily.     DULoxetine 30 MG capsule  Commonly known as:  CYMBALTA  Take 30 mg by mouth at bedtime.     FLUoxetine 40 MG capsule  Commonly known as:  PROZAC  Take 40 mg by mouth daily.     Fluticasone-Salmeterol 250-50 MCG/DOSE Aepb  Commonly known as:  ADVAIR  Inhale 1 puff into the lungs every 12 (twelve) hours.     glipiZIDE-metformin 5-500 MG per tablet  Commonly known as:  METAGLIP  Take 1 tablet by mouth 2 (two) times daily before a meal.     hydrOXYzine 50 MG tablet  Commonly known as:  ATARAX/VISTARIL  Take 50 mg by mouth 2 (two) times daily.     insulin aspart protamine- aspart (70-30) 100 UNIT/ML injection  Commonly known as:  NOVOLOG MIX 70/30  Inject 50 Units into the skin 2 (two) times daily with a meal.     INVOKANA 100 MG Tabs  Generic drug:  Canagliflozin  Take 1 tablet by mouth daily.     levETIRAcetam 750 MG tablet  Commonly known as:  KEPPRA  Take 750 mg by mouth daily.     lisinopril 20 MG tablet  Commonly known as:  PRINIVIL,ZESTRIL  Take 20 mg by mouth daily.     mometasone 50 MCG/ACT nasal spray  Commonly known as:  NASONEX  Place 2 sprays into the nose daily.      naproxen sodium 550 MG tablet  Commonly known as:  ANAPROX  Take 550 mg by mouth 2 (two) times daily with a meal.     omeprazole 20 MG capsule  Commonly known as:  PRILOSEC  Take 20 mg by mouth daily.     promethazine 25 MG tablet  Commonly known as:  PHENERGAN  Take 25 mg by mouth every 6 (six) hours as needed for nausea.     rOPINIRole 3 MG tablet  Commonly known as:  REQUIP  Take 3 mg by mouth daily.     traMADol 50 MG tablet  Commonly known as:  ULTRAM  Take 1-2 tablets (50-100 mg total) by mouth every 6 (six) hours as needed.     traMADol 50 MG tablet  Commonly known as:  ULTRAM  Take 50 mg by mouth every 6 (six) hours as needed for pain.     zolpidem 10  MG tablet  Commonly known as:  AMBIEN  Take 10 mg by mouth at bedtime.             Follow-up Information   Follow up with [facility] On 07/20/2013. (Your appointment is at 2:15pm, please arrive at least 30 minutes before your appointment to complete your check in paperwork.  If you are unable to arrive 30 minutes prior to your appointment time we may have to cancel or reschedule you.)    Contact information:   [address]   [place] [address] [phone]       Signed: Candiss Norse [facility] [phone]  06/29/2013, 8:01 AM

## 2013-06-29 NOTE — Discharge Planning (Signed)
Patient discharged home in stable condition. Verbalizes understanding of all discharge instructions, including home medications and follow up appointments. 

## 2013-06-29 NOTE — Discharge Summary (Signed)
I have seen and examined the patient and agree with the assessment and plans.  Kriston Pasquarello A. Latima Hamza  MD, FACS  

## 2013-07-12 ENCOUNTER — Emergency Department (HOSPITAL_COMMUNITY): Payer: Medicaid Other

## 2013-07-12 ENCOUNTER — Encounter (HOSPITAL_COMMUNITY): Payer: Self-pay | Admitting: *Deleted

## 2013-07-12 ENCOUNTER — Emergency Department (HOSPITAL_COMMUNITY)
Admission: EM | Admit: 2013-07-12 | Discharge: 2013-07-13 | Disposition: A | Discharge status: Home / Self Care | Payer: Medicaid Other | Attending: Emergency Medicine | Admitting: Emergency Medicine

## 2013-07-12 DIAGNOSIS — E119 Type 2 diabetes mellitus without complications: Secondary | ICD-10-CM | POA: Insufficient documentation

## 2013-07-12 DIAGNOSIS — J9801 Acute bronchospasm: Secondary | ICD-10-CM

## 2013-07-12 DIAGNOSIS — Z79899 Other long term (current) drug therapy: Secondary | ICD-10-CM | POA: Insufficient documentation

## 2013-07-12 DIAGNOSIS — Z87891 Personal history of nicotine dependence: Secondary | ICD-10-CM | POA: Insufficient documentation

## 2013-07-12 DIAGNOSIS — Z794 Long term (current) use of insulin: Secondary | ICD-10-CM | POA: Insufficient documentation

## 2013-07-12 DIAGNOSIS — I10 Essential (primary) hypertension: Secondary | ICD-10-CM | POA: Insufficient documentation

## 2013-07-12 DIAGNOSIS — F329 Major depressive disorder, single episode, unspecified: Secondary | ICD-10-CM | POA: Insufficient documentation

## 2013-07-12 DIAGNOSIS — F3289 Other specified depressive episodes: Secondary | ICD-10-CM | POA: Insufficient documentation

## 2013-07-12 DIAGNOSIS — K219 Gastro-esophageal reflux disease without esophagitis: Secondary | ICD-10-CM | POA: Insufficient documentation

## 2013-07-12 DIAGNOSIS — Z862 Personal history of diseases of the blood and blood-forming organs and certain disorders involving the immune mechanism: Secondary | ICD-10-CM | POA: Insufficient documentation

## 2013-07-12 DIAGNOSIS — F411 Generalized anxiety disorder: Secondary | ICD-10-CM | POA: Insufficient documentation

## 2013-07-12 DIAGNOSIS — G609 Hereditary and idiopathic neuropathy, unspecified: Secondary | ICD-10-CM | POA: Insufficient documentation

## 2013-07-12 NOTE — ED Provider Notes (Signed)
CSN: [id number]     Arrival date & time 07/12/13  2210 History  This chart was scribed for non-physician practitioner, Earley Favor, FNP working with Sunnie Nielsen, MD by Greggory Stallion, ED scribe. This patient was seen in room TR08C/TR08C and the patient's care was started at 11:30 PM.   Chief Complaint  Patient presents with  . Cough   The history is provided by the patient. No language interpreter was used.    HPI Comments: Vanessa Case is a 42 y.o. female who presents to the Emergency Department complaining of unproductive cough and sore throat that started 2 weeks ago. Pt states she was in the hospital a few weeks ago and was intubated. Pt denies fever or any other associated symptoms.   Past Medical History  Diagnosis Date  . Anemia   . GERD (gastroesophageal reflux disease)   . Depression   . Anxiety   . 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 Hypertension     maintained on lisinopril hct, metoprolol-134/86 at pat visit  . Neuropathy    Past Surgical History  Procedure Laterality Date  . Cesarean section  x3  . Abdominal hysterectomy  10/30/2011    Procedure: HYSTERECTOMY ABDOMINAL;  Surgeon: Bing Plume, MD;  Location: [facility];  Service: Gynecology;  Laterality: N/A;  . Salpingoophorectomy  10/30/2011    Procedure: SALPINGO OOPHERECTOMY;  Surgeon: Bing Plume, MD;  Location: [facility];  Service: Gynecology;  Laterality: Right;  . Cholecystectomy N/A 06/28/2013    Procedure: LAPAROSCOPIC CHOLECYSTECTOMY;  Surgeon: Shelly Rubenstein, MD;  Location: [facility];  Service: General;  Laterality: N/A;   History reviewed. No pertinent family history. History  Substance Use Topics  . Smoking status: Former Smoker    Quit date: 10/24/1997  . Smokeless tobacco: Not on file  . Alcohol Use: No   OB History   Grav Para Term Preterm Abortions TAB SAB Ect Mult Living                 Review of Systems   Constitutional: Negative for fever.  HENT: Positive for sore throat.   Respiratory: Positive for cough. Negative for shortness of breath and wheezing.   Cardiovascular: Negative for chest pain.  Gastrointestinal: Negative for nausea and vomiting.  Neurological: Negative for dizziness and headaches.  All other systems reviewed and are negative.    Allergies  Codeine; Hydrocodone; and Percocet  Home Medications   Current Outpatient Rx  Name  Route  Sig  Dispense  Refill  . albuterol (PROVENTIL HFA;VENTOLIN HFA) 108 (90 BASE) MCG/ACT inhaler   Inhalation   Inhale 2 puffs into the lungs every 6 (six) hours as needed for wheezing or shortness of breath.          Marland Kitchen amitriptyline (ELAVIL) 100 MG tablet   Oral   Take 100 mg by mouth at bedtime.         Marland Kitchen atenolol (TENORMIN) 100 MG tablet   Oral   Take 100 mg by mouth daily.         . Canagliflozin (INVOKANA) 100 MG TABS   Oral   Take 100 mg by mouth daily.          . DULoxetine (CYMBALTA) 30 MG capsule   Oral   Take 30 mg by mouth at bedtime.         Marland Kitchen FLUoxetine (PROZAC) 40 MG capsule  Oral   Take 40 mg by mouth daily.         . Fluticasone-Salmeterol (ADVAIR) 250-50 MCG/DOSE AEPB   Inhalation   Inhale 1 puff into the lungs every 12 (twelve) hours.         Marland Kitchen glipiZIDE-metformin (METAGLIP) 5-500 MG per tablet   Oral   Take 1 tablet by mouth 2 (two) times daily before a meal.         . hydrOXYzine (ATARAX/VISTARIL) 50 MG tablet   Oral   Take 50 mg by mouth 2 (two) times daily.         . insulin aspart protamine-insulin aspart (NOVOLOG 70/30) (70-30) 100 UNIT/ML injection   Subcutaneous   Inject 50 Units into the skin 2 (two) times daily with a meal.          . levETIRAcetam (KEPPRA) 750 MG tablet   Oral   Take 750 mg by mouth daily.         Marland Kitchen lisinopril (PRINIVIL,ZESTRIL) 20 MG tablet   Oral   Take 20 mg by mouth daily.         . mometasone (NASONEX) 50 MCG/ACT nasal spray   Nasal    Place 2 sprays into the nose daily.         . naproxen sodium (ANAPROX) 550 MG tablet   Oral   Take 550 mg by mouth 2 (two) times daily with a meal.         . omeprazole (PRILOSEC) 20 MG capsule   Oral   Take 20 mg by mouth daily.          . promethazine (PHENERGAN) 25 MG tablet   Oral   Take 25 mg by mouth every 6 (six) hours as needed for nausea.         Marland Kitchen rOPINIRole (REQUIP) 3 MG tablet   Oral   Take 3 mg by mouth daily.         . traMADol (ULTRAM) 50 MG tablet   Oral   Take 50-100 mg by mouth every 6 (six) hours as needed for pain.          Marland Kitchen zolpidem (AMBIEN) 10 MG tablet   Oral   Take 10 mg by mouth at bedtime.          BP 147/104  Pulse 95  Temp(Src) 98.3 F (36.8 C) (Oral)  Resp 20  Wt 217 lb (98.431 kg)  BMI 36.11 kg/m2  SpO2 100%  LMP 10/07/2011  Physical Exam  Nursing note and vitals reviewed. Constitutional: She is oriented to person, place, and time. She appears well-developed and well-nourished. No distress.  HENT:  Head: Normocephalic and atraumatic.  Mouth/Throat: Oropharynx is clear and moist.  Eyes: EOM are normal.  Neck: Neck supple. No tracheal deviation present.  Cardiovascular: Normal rate.   Pulmonary/Chest: Effort normal. No respiratory distress. She has no wheezes. She exhibits no tenderness.  Musculoskeletal: Normal range of motion.  Lymphadenopathy:    She has no cervical adenopathy.  Neurological: She is alert and oriented to person, place, and time.  Skin: Skin is warm and dry.  Psychiatric: She has a normal mood and affect. Her behavior is normal.    ED Course  Procedures (including critical care time)  DIAGNOSTIC STUDIES: Oxygen Saturation is 100% on RA, normal by my interpretation.    COORDINATION OF CARE: 11:33 PM-Discussed treatment plan which includes chest xray with pt at bedside and pt agreed to plan.   Labs Review Labs  Reviewed - No data to display Imaging Review Dg Chest 2 View  07/12/2013    CLINICAL DATA:  Cough with shortness of breath for 2 days. Recent gallbladder surgery.  EXAM: CHEST  2 VIEW  COMPARISON:  03/15/2011 radiographs.  FINDINGS: There is lordotic positioning on the frontal examination. Allowing for this, the heart size and mediastinal contours are stable. The lungs are clear. There is no pleural effusion or pneumothorax. No osseous abnormalities are identified. Cholecystectomy clips are noted.  IMPRESSION: No active cardiopulmonary process.   Electronically Signed   By: Roxy Horseman   On: 07/12/2013 22:56    MDM   1. Bronchospasm     There is less concern for PE in the this patient as the symptoms have been present since intubation and have not changed  Chest xray is normal   Relief with albuterol neb will DC home with inhaler to use for the next several days       Arman Filter, NP 07/13/13 [phone]

## 2013-07-12 NOTE — ED Notes (Signed)
Pt in c/o dry cough and sore throat over the last two weeks, states she was in the hospital a few weeks ago and had her gallbladder removed so she wanted to come and get checked out, denies fever at home, no distress noted

## 2013-07-13 MED ORDER — ALBUTEROL SULFATE (5 MG/ML) 0.5% IN NEBU
2.5000 mg | INHALATION_SOLUTION | Freq: Once | RESPIRATORY_TRACT | Status: AC
  Administered 2013-07-13: 2.5 mg via RESPIRATORY_TRACT
  Filled 2013-07-13: qty 0.5

## 2013-07-13 MED ORDER — ALBUTEROL SULFATE HFA 108 (90 BASE) MCG/ACT IN AERS
2.0000 | INHALATION_SPRAY | RESPIRATORY_TRACT | Status: DC | PRN
  Filled 2013-07-13: qty 6.7

## 2013-07-13 NOTE — ED Provider Notes (Signed)
Medical screening examination/treatment/procedure(s) were performed by non-physician practitioner and as supervising physician I was immediately available for consultation/collaboration.  Sunnie Nielsen, MD 07/13/13 0600

## 2013-07-20 ENCOUNTER — Encounter (INDEPENDENT_AMBULATORY_CARE_PROVIDER_SITE_OTHER): Payer: Self-pay | Admitting: General Surgery

## 2013-07-20 ENCOUNTER — Ambulatory Visit (INDEPENDENT_AMBULATORY_CARE_PROVIDER_SITE_OTHER): Payer: Medicaid Other | Admitting: General Surgery

## 2013-07-20 VITALS — BP 128/78 | HR 84 | Temp 97.6°F | Resp 15 | Ht 65.0 in | Wt 212.2 lb

## 2013-07-20 DIAGNOSIS — K8000 Calculus of gallbladder with acute cholecystitis without obstruction: Secondary | ICD-10-CM

## 2013-07-20 DIAGNOSIS — Z9889 Other specified postprocedural states: Secondary | ICD-10-CM

## 2013-07-20 DIAGNOSIS — Z9049 Acquired absence of other specified parts of digestive tract: Secondary | ICD-10-CM

## 2013-07-20 NOTE — Progress Notes (Signed)
NUSAYBA CADENAS 21-Sep-1971 [id number] 07/20/2013   KAMIRA MELLETTE is a 42 y.o. female who had a laparoscopic cholecystectomy with intraoperative cholangiogram by Dr. Magnus Ivan on 06/28/13 .  The pathology report confirmed chronic inflammation, cholelithiasis which was reviewed with with the patient and a copy was provided..  The patient reports that they are feeling well with  good appetite.  She is having some diarrhea, several times per day.  Modifying factors include Imodium, then colace because Imodium "backs me up."  Denies fever or chills.  The pre-operative symptoms of abdominal pain, nausea, and vomiting have resolved.    Physical examination Filed Vitals:   07/20/13 1443  BP: 128/78  Pulse: 84  Temp: 97.6 F (36.4 C)  Resp: 15     Incisions appear well-healed with no sign of infection or bleeding.   Abdomen - soft, non-tender  Impression:  s/p laparoscopic cholecystectomy  Plan:  She may resume a regular diet and full activity. She may start taking metamucil daily to help bulk up stool She may follow-up on a PRN basis if diarrhea persists.

## 2013-07-20 NOTE — Patient Instructions (Signed)
You may resume normal activities.  Follow up with your primary care doctor.  Follow up in our clinic as needed.

## 2013-08-26 ENCOUNTER — Other Ambulatory Visit: Payer: Self-pay

## 2013-09-29 ENCOUNTER — Telehealth (INDEPENDENT_AMBULATORY_CARE_PROVIDER_SITE_OTHER): Payer: Self-pay

## 2013-09-29 NOTE — Telephone Encounter (Signed)
Pt called stating she was advised by the PA at po visit to use metamucil to improve loose stools. Pt states she still has 5-6 loose stools daily and wants recommendation. Pt states otherwise she is doing well and wds are healed. Pt advised this will be sent to Dr Magnus Ivan to review and advise. Pt can be reached at [phone].

## 2013-09-29 NOTE — Telephone Encounter (Signed)
Stop the metamucil

## 2013-09-30 NOTE — Telephone Encounter (Signed)
Pt advised and will call back if not improving.

## 2013-10-05 ENCOUNTER — Telehealth (INDEPENDENT_AMBULATORY_CARE_PROVIDER_SITE_OTHER): Payer: Self-pay

## 2013-10-05 ENCOUNTER — Telehealth (INDEPENDENT_AMBULATORY_CARE_PROVIDER_SITE_OTHER): Payer: Self-pay | Admitting: General Surgery

## 2013-10-05 NOTE — Telephone Encounter (Signed)
Pt calling in to report that she is not feeling good since her lap chole on 06/28/13 by Dr Magnus Ivan. The pt has been experiencing diarrhea, weight loss, and being extremely thirsty all the time. The pt thought this would get better on it's own but clearly this is not. The pt called the other day asking about stopping the Metamucil and the pt has stopped taking this. I asked if the pt has ever seen a GI physician before and she has never seen one. Please advise.

## 2013-10-05 NOTE — Telephone Encounter (Signed)
Called patient back and made her an apt to see Dr Magnus Ivan on 10-07-13

## 2013-10-06 ENCOUNTER — Encounter (HOSPITAL_COMMUNITY): Payer: Self-pay | Admitting: Emergency Medicine

## 2013-10-06 ENCOUNTER — Emergency Department (HOSPITAL_COMMUNITY)
Admission: EM | Admit: 2013-10-06 | Discharge: 2013-10-06 | Disposition: A | Discharge status: Home / Self Care | Payer: Self-pay | Attending: Emergency Medicine | Admitting: Emergency Medicine

## 2013-10-06 ENCOUNTER — Emergency Department (HOSPITAL_COMMUNITY): Payer: Self-pay

## 2013-10-06 DIAGNOSIS — G8918 Other acute postprocedural pain: Secondary | ICD-10-CM | POA: Insufficient documentation

## 2013-10-06 DIAGNOSIS — R109 Unspecified abdominal pain: Secondary | ICD-10-CM | POA: Insufficient documentation

## 2013-10-06 DIAGNOSIS — Z9089 Acquired absence of other organs: Secondary | ICD-10-CM | POA: Insufficient documentation

## 2013-10-06 DIAGNOSIS — R197 Diarrhea, unspecified: Secondary | ICD-10-CM | POA: Insufficient documentation

## 2013-10-06 DIAGNOSIS — Z8719 Personal history of other diseases of the digestive system: Secondary | ICD-10-CM | POA: Insufficient documentation

## 2013-10-06 DIAGNOSIS — F3289 Other specified depressive episodes: Secondary | ICD-10-CM | POA: Insufficient documentation

## 2013-10-06 DIAGNOSIS — IMO0002 Reserved for concepts with insufficient information to code with codable children: Secondary | ICD-10-CM | POA: Insufficient documentation

## 2013-10-06 DIAGNOSIS — Z90710 Acquired absence of both cervix and uterus: Secondary | ICD-10-CM | POA: Insufficient documentation

## 2013-10-06 DIAGNOSIS — Z79899 Other long term (current) drug therapy: Secondary | ICD-10-CM | POA: Insufficient documentation

## 2013-10-06 DIAGNOSIS — Z8739 Personal history of other diseases of the musculoskeletal system and connective tissue: Secondary | ICD-10-CM | POA: Insufficient documentation

## 2013-10-06 DIAGNOSIS — E119 Type 2 diabetes mellitus without complications: Secondary | ICD-10-CM | POA: Insufficient documentation

## 2013-10-06 DIAGNOSIS — I10 Essential (primary) hypertension: Secondary | ICD-10-CM | POA: Insufficient documentation

## 2013-10-06 DIAGNOSIS — F411 Generalized anxiety disorder: Secondary | ICD-10-CM | POA: Insufficient documentation

## 2013-10-06 DIAGNOSIS — F329 Major depressive disorder, single episode, unspecified: Secondary | ICD-10-CM | POA: Insufficient documentation

## 2013-10-06 DIAGNOSIS — Z862 Personal history of diseases of the blood and blood-forming organs and certain disorders involving the immune mechanism: Secondary | ICD-10-CM | POA: Insufficient documentation

## 2013-10-06 DIAGNOSIS — Z8669 Personal history of other diseases of the nervous system and sense organs: Secondary | ICD-10-CM | POA: Insufficient documentation

## 2013-10-06 DIAGNOSIS — R1010 Upper abdominal pain, unspecified: Secondary | ICD-10-CM

## 2013-10-06 DIAGNOSIS — Z87891 Personal history of nicotine dependence: Secondary | ICD-10-CM | POA: Insufficient documentation

## 2013-10-06 LAB — URINALYSIS, ROUTINE W REFLEX MICROSCOPIC
Bilirubin Urine: NEGATIVE
Glucose, UA: >1000 mg/dL — AB
Ketones, ur: 15 mg/dL — AB
Nitrite: NEGATIVE
Protein, ur: NEGATIVE mg/dL
Specific Gravity, Urine: 1.042 — ABNORMAL HIGH (ref 1.005–1.030)
Urobilinogen, UA: 0.2 mg/dL (ref 0.0–1.0)

## 2013-10-06 LAB — URINE MICROSCOPIC-ADD ON

## 2013-10-06 LAB — CBC WITH DIFFERENTIAL/PLATELET
Eosinophils Absolute: 0.1 10*3/uL (ref 0.0–0.7)
Eosinophils Relative: 1 % (ref 0–5)
Hemoglobin: 12.3 g/dL (ref 12.0–15.0)
Lymphs Abs: 3.5 10*3/uL (ref 0.7–4.0)
MCH: 26.8 pg (ref 26.0–34.0)
MCV: 82.4 fL (ref 78.0–100.0)
Monocytes Relative: 4 % (ref 3–12)
RBC: 4.59 MIL/uL (ref 3.87–5.11)

## 2013-10-06 LAB — COMPREHENSIVE METABOLIC PANEL
AST: 84 U/L — ABNORMAL HIGH (ref 0–37)
Alkaline Phosphatase: 85 U/L (ref 39–117)
BUN: 4 mg/dL — ABNORMAL LOW (ref 6–23)
Calcium: 9.7 mg/dL (ref 8.4–10.5)
Chloride: 95 mEq/L — ABNORMAL LOW (ref 96–112)
GFR calc Af Amer: >90 mL/min (ref >90)
GFR calc non Af Amer: >90 mL/min (ref >90)
Glucose, Bld: 333 mg/dL — ABNORMAL HIGH (ref 70–99)
Sodium: 133 mEq/L — ABNORMAL LOW (ref 135–145)
Total Bilirubin: 0.3 mg/dL (ref 0.3–1.2)
Total Protein: 8 g/dL (ref 6.0–8.3)

## 2013-10-06 LAB — LIPASE, BLOOD: Lipase: 30 U/L (ref 11–59)

## 2013-10-06 MED ORDER — HYDROMORPHONE HCL PF 1 MG/ML IJ SOLN
1.0000 mg | Freq: Once | INTRAMUSCULAR | Status: AC
  Administered 2013-10-06: 1 mg via INTRAVENOUS
  Filled 2013-10-06: qty 1

## 2013-10-06 MED ORDER — ONDANSETRON HCL 4 MG/2ML IJ SOLN
4.0000 mg | Freq: Once | INTRAMUSCULAR | Status: AC
  Administered 2013-10-06: 4 mg via INTRAVENOUS
  Filled 2013-10-06: qty 2

## 2013-10-06 MED ORDER — PANTOPRAZOLE SODIUM 40 MG IV SOLR
40.0000 mg | Freq: Once | INTRAVENOUS | Status: AC
  Administered 2013-10-06: 40 mg via INTRAVENOUS
  Filled 2013-10-06: qty 40

## 2013-10-06 MED ORDER — SODIUM CHLORIDE 0.9 % IV BOLUS (SEPSIS)
1000.0000 mL | Freq: Once | INTRAVENOUS | Status: AC
  Administered 2013-10-06: 1000 mL via INTRAVENOUS

## 2013-10-06 MED ORDER — INFLUENZA VAC SPLIT QUAD 0.5 ML IM SUSP
0.5000 mL | INTRAMUSCULAR | Status: AC
  Administered 2013-10-06: 0.5 mL via INTRAMUSCULAR
  Filled 2013-10-06: qty 0.5

## 2013-10-06 MED ORDER — PANTOPRAZOLE SODIUM 20 MG PO TBEC: 20.0000 mg | DELAYED_RELEASE_TABLET | Freq: Every day | ORAL | Status: DC

## 2013-10-06 NOTE — ED Provider Notes (Signed)
CSN: [id number]     Arrival date & time 10/06/13  0004 History   First MD Initiated Contact with Patient 10/06/13 [phone]     Chief Complaint  Patient presents with  . Abdominal Pain   (Consider location/radiation/quality/duration/timing/severity/associated sxs/prior Treatment) HPI Patient states that she has had abdominal pain with diarrhea since her surgery to remove her gallbladder in September. The pain is located in her upper abdomen and is worse when he eating. She states she is having up to 10 bowel movements per day. She's had no fevers or chills. She is followed closely by her surgeon who she states told her to come to the emergency department to be evaluated for her ongoing pain and diarrhea. She's had no nausea or vomiting. Past Medical History  Diagnosis Date  . Anemia   . GERD (gastroesophageal reflux disease)   . Depression   . Anxiety   . 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 Hypertension     maintained on lisinopril hct, metoprolol-134/86 at pat visit  . Neuropathy   . Arthritis   . Hyperlipidemia   . Neuromuscular disorder   . Substance abuse    Past Surgical History  Procedure Laterality Date  . Cesarean section  x3  . Abdominal hysterectomy  10/30/2011    Procedure: HYSTERECTOMY ABDOMINAL;  Surgeon: Bing Plume, MD;  Location: [facility];  Service: Gynecology;  Laterality: N/A;  . Salpingoophorectomy  10/30/2011    Procedure: SALPINGO OOPHERECTOMY;  Surgeon: Bing Plume, MD;  Location: [facility];  Service: Gynecology;  Laterality: Right;  . Cholecystectomy N/A 06/28/2013    Procedure: LAPAROSCOPIC CHOLECYSTECTOMY;  Surgeon: Shelly Rubenstein, MD;  Location: [facility];  Service: General;  Laterality: N/A;   No family history on file. History  Substance Use Topics  . Smoking status: Former Smoker    Quit date: 10/24/1997  . Smokeless tobacco: Never Used  . Alcohol Use: No   OB History   Grav Para Term Preterm Abortions TAB SAB Ect Mult Living   3 3 3       3      Review of Systems  Constitutional: Negative for fever and chills.  Respiratory: Negative for shortness of breath.   Cardiovascular: Negative for chest pain, palpitations and leg swelling.  Gastrointestinal: Positive for abdominal pain and diarrhea. Negative for nausea, vomiting and constipation.  Genitourinary: Negative for dysuria, hematuria and flank pain.  Musculoskeletal: Negative for back pain, myalgias, neck pain and neck stiffness.  Skin: Negative for rash and wound.  Neurological: Negative for dizziness, syncope, weakness, light-headedness, numbness and headaches.  All other systems reviewed and are negative.    Allergies  Codeine; Hydrocodone; and Percocet  Home Medications   Current Outpatient Rx  Name  Route  Sig  Dispense  Refill  . albuterol (PROVENTIL HFA;VENTOLIN HFA) 108 (90 BASE) MCG/ACT inhaler   Inhalation   Inhale 2 puffs into the lungs every 6 (six) hours as needed for wheezing or shortness of breath.          Marland Kitchen FLUoxetine (PROZAC) 40 MG capsule   Oral   Take 40 mg by mouth daily.         . hydrOXYzine (ATARAX/VISTARIL) 50 MG tablet   Oral   Take 50 mg by mouth 2 (two) times daily.         . mometasone-formoterol (DULERA) 100-5 MCG/ACT AERO   Inhalation   Inhale  2 puffs into the lungs 2 (two) times daily.          BP 145/85  Pulse 91  Temp(Src) 98 F (36.7 C) (Oral)  Resp 18  Ht 5\' 5"  (1.651 m)  Wt 224 lb (101.606 kg)  BMI 37.28 kg/m2  SpO2 100%  LMP 10/07/2011 Physical Exam  Nursing note and vitals reviewed. Constitutional: She is oriented to person, place, and time. She appears well-developed and well-nourished. No distress.  HENT:  Head: Normocephalic and atraumatic.  Mouth/Throat: Oropharynx is clear and moist.  Eyes: EOM are normal. Pupils are equal, round, and reactive to light.  Neck: Normal range of motion. Neck supple.  Cardiovascular: Normal  rate and regular rhythm.   Pulmonary/Chest: Effort normal and breath sounds normal. No respiratory distress. She has no wheezes. She has no rales.  Abdominal: Soft. Bowel sounds are normal. She exhibits no distension and no mass. There is tenderness (tenderness to palpation in the epigastrium and left upper quadrants.). There is no rebound and no guarding.  Musculoskeletal: Normal range of motion. She exhibits no edema and no tenderness.  Neurological: She is alert and oriented to person, place, and time.  Patient is alert and oriented x3 with clear, goal oriented speech. Patient has 5/5 motor in all extremities. Sensation is intact to light touch.  Skin: Skin is warm and dry. No rash noted. No erythema.  Psychiatric: She has a normal mood and affect. Her behavior is normal.    ED Course  Procedures (including critical care time) Labs Review Labs Reviewed  COMPREHENSIVE METABOLIC PANEL - Abnormal; Notable for the following:    Sodium 133 (*)    Chloride 95 (*)    Glucose, Bld 333 (*)    BUN 4 (*)    AST 84 (*)    ALT 54 (*)    All other components within normal limits  URINALYSIS, ROUTINE W REFLEX MICROSCOPIC - Abnormal; Notable for the following:    Specific Gravity, Urine 1.042 (*)    Glucose, UA >1000 (*)    Ketones, ur 15 (*)    All other components within normal limits  CBC WITH DIFFERENTIAL  LIPASE, BLOOD  URINE MICROSCOPIC-ADD ON   Imaging Review No results found.  EKG Interpretation   None       MDM    Patient's pain has resolved. Ultrasound without acute findings. Suspect GERD/gastritis as the cause of patient's symptoms. We'll start on PPI and have followup with gastroenterology. Return precautions have been given. Patient's voice understanding.  Loren Racer, MD 10/06/13 0500

## 2013-10-06 NOTE — ED Notes (Signed)
Pt present with epigastric pain since having gallbladder removed in September- pt reports she has been having diarrhea since surgery but has "been dealing with it", diarrhea has increased since Thursday- reports taking Metamucil at home and changing diet without relief, pt noticed small amount of bright red blood after wiping 2 days ago.  LLQ and epigastric area tender to palpate.  Pt states she has not been able to eat because it makes her nausea.

## 2013-10-06 NOTE — ED Notes (Signed)
Pt states she was seen by her PCP and told to come to the ED because she is having 10 Bm's per days and her pain level.

## 2013-10-07 ENCOUNTER — Encounter (INDEPENDENT_AMBULATORY_CARE_PROVIDER_SITE_OTHER): Payer: Self-pay | Admitting: Surgery

## 2013-10-07 ENCOUNTER — Other Ambulatory Visit (INDEPENDENT_AMBULATORY_CARE_PROVIDER_SITE_OTHER): Payer: Self-pay | Admitting: Surgery

## 2013-10-07 ENCOUNTER — Ambulatory Visit (INDEPENDENT_AMBULATORY_CARE_PROVIDER_SITE_OTHER): Payer: Self-pay | Admitting: Surgery

## 2013-10-07 VITALS — BP 122/70 | HR 79 | Temp 98.1°F | Resp 18 | Ht 65.0 in | Wt 211.0 lb

## 2013-10-07 DIAGNOSIS — G8929 Other chronic pain: Secondary | ICD-10-CM

## 2013-10-07 DIAGNOSIS — R195 Other fecal abnormalities: Secondary | ICD-10-CM

## 2013-10-07 DIAGNOSIS — R1013 Epigastric pain: Secondary | ICD-10-CM

## 2013-10-07 MED ORDER — HYDROCODONE-ACETAMINOPHEN 5-325 MG PO TABS: 1.0000 | ORAL_TABLET | ORAL | Status: DC | PRN

## 2013-10-07 MED ORDER — CHOLESTYRAMINE 4 G PO PACK: 4.0000 g | PACK | Freq: Three times a day (TID) | ORAL | Status: DC

## 2013-10-07 NOTE — Progress Notes (Signed)
Subjective:     Patient ID: Vanessa Case, female   DOB: Nov 05, 1970, 42 y.o.   MRN: [id number]  HPI She is status post laparoscopic cholecystectomy back in September for what was told to be acute cholecystitis. The pathology on the gallbladder showed mild inflammation and stones but no other abnormalities. She is still having similar abdominal pain that she was having preoperatively since February of 2014. It is described as a cramping central abdominal pain. It is exacerbated by fatty meals. She is now having diarrhea approximately 8 times a day. Recent laboratory data was normal except for mildly elevated AST and ALT.  Review of Systems     Objective:   Physical Exam On exam, she is well appearance. She does have mild central abdominal tenderness but no hernias.    Assessment:     Abdominal pain of uncertain etiology.     Plan:     There is a history of irritable bowel syndrome and Crohn's disease in her family. I am going to refer her to a gastroenterologist for evaluation. I am also going to place her on a bile binding medications. I will give her Vicodin for discomfort. I will see her back in one month

## 2013-10-19 ENCOUNTER — Telehealth (INDEPENDENT_AMBULATORY_CARE_PROVIDER_SITE_OTHER): Payer: Self-pay

## 2013-10-19 NOTE — Telephone Encounter (Signed)
Pt is unable to afford Rx for Questran.  Is there an alternative?

## 2013-10-20 ENCOUNTER — Telehealth (INDEPENDENT_AMBULATORY_CARE_PROVIDER_SITE_OTHER): Payer: Self-pay | Admitting: General Surgery

## 2013-10-20 NOTE — Telephone Encounter (Signed)
Called the husband back and I told him what Dr Magnus Ivan stated that they will need to talk to a pharmacists on what to take

## 2013-11-01 ENCOUNTER — Encounter (INDEPENDENT_AMBULATORY_CARE_PROVIDER_SITE_OTHER): Payer: Self-pay | Admitting: Surgery

## 2013-11-05 ENCOUNTER — Encounter: Payer: Self-pay | Admitting: Gastroenterology

## 2013-11-05 ENCOUNTER — Telehealth (INDEPENDENT_AMBULATORY_CARE_PROVIDER_SITE_OTHER): Payer: Self-pay | Admitting: *Deleted

## 2013-11-05 NOTE — Telephone Encounter (Signed)
I spoke with pt and informed her [facility] has attempted to call her once to schedule an appt.  She states she didn't know they have called.  I provided her with their phone number and she will call to get an appt scheduled.

## 2013-11-24 ENCOUNTER — Encounter: Payer: Self-pay | Admitting: Gastroenterology

## 2013-11-24 ENCOUNTER — Ambulatory Visit (INDEPENDENT_AMBULATORY_CARE_PROVIDER_SITE_OTHER): Payer: Self-pay | Admitting: Gastroenterology

## 2013-11-24 ENCOUNTER — Other Ambulatory Visit (INDEPENDENT_AMBULATORY_CARE_PROVIDER_SITE_OTHER): Payer: Self-pay

## 2013-11-24 VITALS — BP 112/78 | HR 80 | Ht 65.0 in | Wt 204.0 lb

## 2013-11-24 DIAGNOSIS — R197 Diarrhea, unspecified: Secondary | ICD-10-CM

## 2013-11-24 DIAGNOSIS — R740 Nonspecific elevation of levels of transaminase and lactic acid dehydrogenase [LDH]: Secondary | ICD-10-CM

## 2013-11-24 DIAGNOSIS — R109 Unspecified abdominal pain: Secondary | ICD-10-CM

## 2013-11-24 DIAGNOSIS — R7402 Elevation of levels of lactic acid dehydrogenase (LDH): Secondary | ICD-10-CM

## 2013-11-24 DIAGNOSIS — K760 Fatty (change of) liver, not elsewhere classified: Secondary | ICD-10-CM

## 2013-11-24 DIAGNOSIS — K7689 Other specified diseases of liver: Secondary | ICD-10-CM

## 2013-11-24 LAB — IGA: IGA: 144 mg/dL (ref 68–378)

## 2013-11-24 MED ORDER — CIPROFLOXACIN HCL 500 MG PO TABS: 500.0000 mg | ORAL_TABLET | Freq: Two times a day (BID) | ORAL | Status: DC

## 2013-11-24 MED ORDER — METRONIDAZOLE 500 MG PO TABS: 500.0000 mg | ORAL_TABLET | Freq: Two times a day (BID) | ORAL | Status: DC

## 2013-11-24 NOTE — Progress Notes (Signed)
    History of Present Illness: This is a 43 year old female accompanied by her husband. She relates a long history of mild postprandial upper abdominal discomfort and occasional urgent bowel movements following meals. She underwent cholecystectomy by Dr. Rayburn MaBlackmon in September and since that time she has had a substantial worsening of her symptoms with 6-8 bowel movements a day generally following meals and postprandial generalized abdominal pain. She was evaluated in the ED for her symptoms in December 2014. An abdominal ultrasound imaging showed fatty liver.  A CT scan prior to her cholecystectomy in September showed fatty liver and gallstones. In December in  the emergency department her glucose was 333, AST 84, ALT 54. She states she lost her health insurance and has not had regular followup care for her diabetes and other medical problems in the past few months. Denies weight loss, constipation, change in stool caliber, melena, hematochezia, nausea, vomiting, dysphagia, reflux symptoms, chest pain.  Review of Systems: Pertinent positive and negative review of systems were noted in the above HPI section. All other review of systems were otherwise negative.  Current Medications, Allergies, Past Medical History, Past Surgical History, Family History and Social History were reviewed in [facility] electronic medical record.  Physical Exam: General: Well developed , well nourished, in a wheelchair, no acute distress Head: Normocephalic and atraumatic Eyes:  sclerae anicteric, EOMI Ears: Normal auditory acuity Mouth: No deformity or lesions Neck: Supple, no masses or thyromegaly Lungs: Clear throughout to auscultation Heart: Regular rate and rhythm; no murmurs, rubs or bruits Abdomen: Soft, non tender and non distended. No masses, hepatosplenomegaly or hernias noted. Normal Bowel sounds Rectal: Deferred to colonoscopy Musculoskeletal: Symmetrical with no gross deformities  Skin: No lesions on  visible extremities Pulses:  Normal pulses noted Extremities: No clubbing, cyanosis, edema or deformities noted Neurological: Alert oriented x 4, grossly nonfocal Cervical Nodes:  No significant cervical adenopathy Inguinal Nodes: No significant inguinal adenopathy Psychological:  Alert and cooperative. Normal mood and affect  Assessment and Recommendations:  1. Diarrhea and generalized abdominal pain, markedly worsened following cholecystectomy. Symptoms have not respond to cholestyramine. Rule out infectious causes, IBD, diabetic diarrhea, postcholecystectomy diarrhea, celiac disease, IBS. Continue cholestyramine 3 times daily. Continue Imodium twice a day when necessary. Obtain stool studies, tTG, IgA, and stool Hemoccults. Empiric course of Cipro and Flagyl for possible bacterial overgrowth after stool studies obtained. Return to her PCP to have her diabetes under better control to allow sedation for colonoscopy, if needed, and improved control of her diabetes improve her diarrhea as well.   2. Diabetes mellitus, poorly controlled. May be contributing to diarrhea. Needs improved management with her primary care physician prior to scheduling colonoscopy.  3. Hepatic steatosis with mild LFT elevations compatible with this disorder. Long-term optimal management of diabetes, weight and lipids by her primary physician will help hepatic steatosis.

## 2013-11-24 NOTE — Patient Instructions (Signed)
Please follow up with your Primary care physician regarding your diabetes management. Once you have have the clearance from your PCP to have a Colonoscopy, please call our office to schedule after we have reviewed the records.   Your physician has requested that you go to the basement for the following lab work before leaving today: GI pathogen panel, Celiac panel.   Follow instructions on Hemoccult cards and mail back to us when finished.   Once you have returned your Stool studies to our lab, then you can pick up your prescription for Cipro and Flagyl from your pharmacy to start taking twice daily x 7 days.   Thank you for choosing me and [facility].  Venita LickMalcolm T. Pleas KochStark, Jr., MD., Clementeen GrahamFACG   cc: Jackie PlumGeorge Osei-Bonsu, MD

## 2013-11-25 LAB — TISSUE TRANSGLUTAMINASE, IGA: TISSUE TRANSGLUTAMINASE AB, IGA: 2.5 U/mL (ref <20)

## 2013-11-29 ENCOUNTER — Encounter (INDEPENDENT_AMBULATORY_CARE_PROVIDER_SITE_OTHER): Payer: Self-pay | Admitting: Surgery

## 2013-11-30 ENCOUNTER — Other Ambulatory Visit: Payer: No Typology Code available for payment source

## 2013-11-30 ENCOUNTER — Encounter (INDEPENDENT_AMBULATORY_CARE_PROVIDER_SITE_OTHER): Payer: Self-pay | Admitting: Surgery

## 2013-11-30 DIAGNOSIS — R197 Diarrhea, unspecified: Secondary | ICD-10-CM

## 2013-11-30 DIAGNOSIS — R109 Unspecified abdominal pain: Secondary | ICD-10-CM

## 2013-12-01 LAB — GASTROINTESTINAL PATHOGEN PANEL PCR
C. difficile Tox A/B, PCR: NEGATIVE
CRYPTOSPORIDIUM, PCR: NEGATIVE
Campylobacter, PCR: NEGATIVE
E coli (ETEC) LT/ST PCR: NEGATIVE
E coli (STEC) stx1/stx2, PCR: NEGATIVE
E coli 0157, PCR: NEGATIVE
GIARDIA LAMBLIA, PCR: NEGATIVE
Norovirus, PCR: NEGATIVE
Rotavirus A, PCR: NEGATIVE
SALMONELLA, PCR: NEGATIVE
SHIGELLA, PCR: NEGATIVE

## 2014-02-04 ENCOUNTER — Emergency Department (HOSPITAL_COMMUNITY): Payer: No Typology Code available for payment source

## 2014-02-04 ENCOUNTER — Emergency Department (HOSPITAL_COMMUNITY)
Admission: EM | Admit: 2014-02-04 | Discharge: 2014-02-04 | Disposition: A | Discharge status: Home / Self Care | Payer: No Typology Code available for payment source | Attending: Emergency Medicine | Admitting: Emergency Medicine

## 2014-02-04 ENCOUNTER — Encounter (HOSPITAL_COMMUNITY): Payer: Self-pay | Admitting: Emergency Medicine

## 2014-02-04 DIAGNOSIS — Z8739 Personal history of other diseases of the musculoskeletal system and connective tissue: Secondary | ICD-10-CM | POA: Insufficient documentation

## 2014-02-04 DIAGNOSIS — Z79899 Other long term (current) drug therapy: Secondary | ICD-10-CM | POA: Insufficient documentation

## 2014-02-04 DIAGNOSIS — E669 Obesity, unspecified: Secondary | ICD-10-CM | POA: Insufficient documentation

## 2014-02-04 DIAGNOSIS — I10 Essential (primary) hypertension: Secondary | ICD-10-CM | POA: Insufficient documentation

## 2014-02-04 DIAGNOSIS — IMO0002 Reserved for concepts with insufficient information to code with codable children: Secondary | ICD-10-CM | POA: Insufficient documentation

## 2014-02-04 DIAGNOSIS — M25569 Pain in unspecified knee: Secondary | ICD-10-CM | POA: Insufficient documentation

## 2014-02-04 DIAGNOSIS — Z862 Personal history of diseases of the blood and blood-forming organs and certain disorders involving the immune mechanism: Secondary | ICD-10-CM | POA: Insufficient documentation

## 2014-02-04 DIAGNOSIS — F411 Generalized anxiety disorder: Secondary | ICD-10-CM | POA: Insufficient documentation

## 2014-02-04 DIAGNOSIS — G8929 Other chronic pain: Secondary | ICD-10-CM | POA: Insufficient documentation

## 2014-02-04 DIAGNOSIS — Z8669 Personal history of other diseases of the nervous system and sense organs: Secondary | ICD-10-CM | POA: Insufficient documentation

## 2014-02-04 DIAGNOSIS — R197 Diarrhea, unspecified: Secondary | ICD-10-CM | POA: Insufficient documentation

## 2014-02-04 DIAGNOSIS — E119 Type 2 diabetes mellitus without complications: Secondary | ICD-10-CM | POA: Insufficient documentation

## 2014-02-04 DIAGNOSIS — Z8719 Personal history of other diseases of the digestive system: Secondary | ICD-10-CM | POA: Insufficient documentation

## 2014-02-04 DIAGNOSIS — F3289 Other specified depressive episodes: Secondary | ICD-10-CM | POA: Insufficient documentation

## 2014-02-04 DIAGNOSIS — F329 Major depressive disorder, single episode, unspecified: Secondary | ICD-10-CM | POA: Insufficient documentation

## 2014-02-04 DIAGNOSIS — Z87891 Personal history of nicotine dependence: Secondary | ICD-10-CM | POA: Insufficient documentation

## 2014-02-04 LAB — COMPREHENSIVE METABOLIC PANEL
ALBUMIN: 4 g/dL (ref 3.5–5.2)
ALT: 72 U/L — ABNORMAL HIGH (ref 0–35)
AST: 92 U/L — ABNORMAL HIGH (ref 0–37)
Alkaline Phosphatase: 87 U/L (ref 39–117)
BUN: 5 mg/dL — AB (ref 6–23)
CO2: 21 mEq/L (ref 19–32)
CREATININE: 0.55 mg/dL (ref 0.50–1.10)
Calcium: 10 mg/dL (ref 8.4–10.5)
Chloride: 96 mEq/L (ref 96–112)
GFR calc Af Amer: >90 mL/min (ref >90)
GFR calc non Af Amer: >90 mL/min (ref >90)
Glucose, Bld: 379 mg/dL — ABNORMAL HIGH (ref 70–99)
Potassium: 4.1 mEq/L (ref 3.7–5.3)
Sodium: 135 mEq/L — ABNORMAL LOW (ref 137–147)
TOTAL PROTEIN: 7.9 g/dL (ref 6.0–8.3)
Total Bilirubin: 0.3 mg/dL (ref 0.3–1.2)

## 2014-02-04 LAB — CBC WITH DIFFERENTIAL/PLATELET
Basophils Absolute: 0 10*3/uL (ref 0.0–0.1)
Basophils Relative: 0 % (ref 0–1)
EOS PCT: 1 % (ref 0–5)
Eosinophils Absolute: 0.1 10*3/uL (ref 0.0–0.7)
HEMATOCRIT: 38.8 % (ref 36.0–46.0)
Hemoglobin: 12.6 g/dL (ref 12.0–15.0)
LYMPHS ABS: 3.1 10*3/uL (ref 0.7–4.0)
LYMPHS PCT: 39 % (ref 12–46)
MCH: 26.1 pg (ref 26.0–34.0)
MCHC: 32.5 g/dL (ref 30.0–36.0)
MCV: 80.3 fL (ref 78.0–100.0)
MONO ABS: 0.4 10*3/uL (ref 0.1–1.0)
MONOS PCT: 5 % (ref 3–12)
Neutro Abs: 4.4 10*3/uL (ref 1.7–7.7)
Neutrophils Relative %: 56 % (ref 43–77)
Platelets: 277 10*3/uL (ref 150–400)
RBC: 4.83 MIL/uL (ref 3.87–5.11)
RDW: 15.1 % (ref 11.5–15.5)
WBC: 8 10*3/uL (ref 4.0–10.5)

## 2014-02-04 LAB — URINE MICROSCOPIC-ADD ON

## 2014-02-04 LAB — URINALYSIS, ROUTINE W REFLEX MICROSCOPIC
Bilirubin Urine: NEGATIVE
Hgb urine dipstick: NEGATIVE
KETONES UR: NEGATIVE mg/dL
LEUKOCYTES UA: NEGATIVE
Nitrite: NEGATIVE
PH: 5.5 (ref 5.0–8.0)
Protein, ur: NEGATIVE mg/dL
Specific Gravity, Urine: 1.045 — ABNORMAL HIGH (ref 1.005–1.030)
Urobilinogen, UA: 0.2 mg/dL (ref 0.0–1.0)

## 2014-02-04 LAB — CBG MONITORING, ED
Glucose-Capillary: 298 mg/dL — ABNORMAL HIGH (ref 70–99)
Glucose-Capillary: 280 mg/dL — ABNORMAL HIGH (ref 70–99)

## 2014-02-04 MED ORDER — HYDROMORPHONE HCL PF 1 MG/ML IJ SOLN
1.0000 mg | Freq: Once | INTRAMUSCULAR | Status: AC
  Administered 2014-02-04: 1 mg via INTRAVENOUS
  Filled 2014-02-04: qty 1

## 2014-02-04 MED ORDER — SODIUM CHLORIDE 0.9 % IV BOLUS (SEPSIS)
1000.0000 mL | Freq: Once | INTRAVENOUS | Status: AC
  Administered 2014-02-04: 1000 mL via INTRAVENOUS

## 2014-02-04 MED ORDER — ONDANSETRON HCL 4 MG/2ML IJ SOLN
4.0000 mg | Freq: Once | INTRAMUSCULAR | Status: AC
  Administered 2014-02-04: 4 mg via INTRAVENOUS
  Filled 2014-02-04: qty 2

## 2014-02-04 NOTE — Discharge Instructions (Signed)
Arthralgia °Your caregiver has diagnosed you as suffering from an arthralgia. Arthralgia means there is pain in a joint. This can come from many reasons including: °· Bruising the joint which causes soreness (inflammation) in the joint. °· Wear and tear on the joints which occur as we grow older (osteoarthritis). °· Overusing the joint. °· Various forms of arthritis. °· Infections of the joint. °Regardless of the cause of pain in your joint, most of these different pains respond to anti-inflammatory drugs and rest. The exception to this is when a joint is infected, and these cases are treated with antibiotics, if it is a bacterial infection. °HOME CARE INSTRUCTIONS  °· Rest the injured area for as long as directed by your caregiver. Then slowly start using the joint as directed by your caregiver and as the pain allows. Crutches as directed may be useful if the ankles, knees or hips are involved. If the knee was splinted or casted, continue use and care as directed. If an stretchy or elastic wrapping bandage has been applied today, it should be removed and re-applied every 3 to 4 hours. It should not be applied tightly, but firmly enough to keep swelling down. Watch toes and feet for swelling, bluish discoloration, coldness, numbness or excessive pain. If any of these problems (symptoms) occur, remove the ace bandage and re-apply more loosely. If these symptoms persist, contact your caregiver or return to this location. °· For the first 24 hours, keep the injured extremity elevated on pillows while lying down. °· Apply ice for 15-20 minutes to the sore joint every couple hours while awake for the first half day. Then 03-04 times per day for the first 48 hours. Put the ice in a plastic bag and place a towel between the bag of ice and your skin. °· Wear any splinting, casting, elastic bandage applications, or slings as instructed. °· Only take over-the-counter or prescription medicines for pain, discomfort, or fever as  directed by your caregiver. Do not use aspirin immediately after the injury unless instructed by your physician. Aspirin can cause increased bleeding and bruising of the tissues. °· If you were given crutches, continue to use them as instructed and do not resume weight bearing on the sore joint until instructed. °Persistent pain and inability to use the sore joint as directed for more than 2 to 3 days are warning signs indicating that you should see a caregiver for a follow-up visit as soon as possible. Initially, a hairline fracture (break in bone) may not be evident on X-rays. Persistent pain and swelling indicate that further evaluation, non-weight bearing or use of the joint (use of crutches or slings as instructed), or further X-rays are indicated. X-rays may sometimes not show a small fracture until a week or 10 days later. Make a follow-up appointment with your own caregiver or one to whom we have referred you. A radiologist (specialist in reading X-rays) may read your X-rays. Make sure you know how you are to obtain your X-ray results. Do not assume everything is normal if you do not hear from us. °SEEK MEDICAL CARE IF: °Bruising, swelling, or pain increases. °SEEK IMMEDIATE MEDICAL CARE IF:  °· Your fingers or toes are numb or blue. °· The pain is not responding to medications and continues to stay the same or get worse. °· The pain in your joint becomes severe. °· You develop a fever over 102° F (38.9° C). °· It becomes impossible to move or use the joint. °MAKE SURE YOU:  °·   Understand these instructions.  Will watch your condition.  Will get help right away if you are not doing well or get worse. Document Released: 10/07/2005 Document Revised: 12/30/2011 Document Reviewed: 05/25/2008 [facility] Patient Information 2014 [employer], [place].  Chronic Diarrhea Diarrhea is frequent loose and watery bowel movements. It can cause you to feel weak and dehydrated. Dehydration can cause you to become tired and  thirsty and to have a dry mouth, decreased urination, and dark yellow urine. Diarrhea is a sign of another problem, most often an infection that will not last long. In most cases, diarrhea lasts 2 3 days. Diarrhea that lasts longer than 4 weeks is called long-lasting (chronic) diarrhea. It is important to treat your diarrhea as directed by your health care provider to lessen or prevent future episodes of diarrhea.  CAUSES  There are many causes of chronic diarrhea. The following are some possible causes:   Gastrointestinal infections caused by viruses, bacteria, or parasites.   Food poisoning or food allergies.   Certain medicines, such as antibiotics, chemotherapy, and laxatives.   Artificial sweeteners and fructose.   Digestive disorders, such as celiac disease and inflammatory bowel diseases.   Irritable bowel syndrome.  Some disorders of the pancreas.  Disorders of the thyroid.  Reduced blood flow to the intestines.  Cancer. Sometimes the cause of chronic diarrhea is unknown. RISK FACTORS  Having a severely weakened immune system, such as from HIV or AIDS.   Taking certain types of cancer-fighting drugs (such as with chemotherapy) or other medicines.   Having had a recent organ transplant.   Having a portion of the stomach or small bowel removed.   Traveling to countries where food and water supplies are often contaminated.  SYMPTOMS  In addition to frequent, loose stools, diarrhea may cause:   Cramping.   Abdominal pain.   Nausea.   Fever.  Fatigue.  Urgent need to use the bathroom.  Loss of bowel control. DIAGNOSIS  Your health care provider must take a careful history and perform a physical exam. Tests given are based on your symptoms and history. Tests may include:   Blood or stool tests. Three or more stool samples may be examined. Stool cultures may be used to test for bacteria or parasites.   X-rays.   A procedure in which a thin  tube is inserted into the mouth or rectum (endoscopy). This allows the health care provider to look inside the intestine.  TREATMENT   Treatment is aimed at correcting the cause of the diarrhea when possible.  Diarrhea caused by an infection can often be treated with antibiotics.   Diarrhea not caused by an infection may require you to take long-term medicine or have surgery. Specific treatment should be discussed with your health care provider.   If the cause cannot be determined, treatment aims to relieve symptoms and prevent dehydration. Serious health problems can occur if you do not maintain proper fluid levels. Treatment may include:   Taking an oral rehydration solution (ORS) .  Not drinking beverages that contain caffeine (such as tea, coffee, and soft drinks).   Not drinking alcohol.   Maintaining well-balanced nutrition to help you recover faster. HOME CARE INSTRUCTIONS   Drink enough fluids to keep urine clear or pale yellow. Drink 1 cup (8 oz) of fluid for each diarrhea episode. Avoid fluids that contain simple sugars, fruit juices, whole milk products, and sodas. Hydrate with an ORS. You may purchase the ORS or prepare it at home by mixing  the following ingredients together:     tsp (1.7 3  mL) table salt.    tsp (3  mL) baking soda.    tsp (1.7 mL) salt substitute containing potassium chloride.   1 tbsp (20 mL) sugar.   4.2 c (1 L) of water.   Certain foods and beverages may increase the speed at which food moves through the gastrointestinal (GI) tract. These foods and beverages should be avoided. They include:   Caffeinated and alcoholic beverages.   High-fiber foods, such as raw fruits and vegetables, nuts, seeds, and whole grain breads and cereals.   Foods and beverages sweetened with sugar alcohols, such as xylitol, sorbitol, and mannitol.   Some foods may be well tolerated and may help thicken stool. These include:  Starchy foods, such as  rice, toast, pasta, low-sugar cereal, oatmeal, grits, baked potatoes, crackers, and bagels.   Bananas.   Applesauce.   Add probiotic-rich foods to help increase healthy bacteria in the GI tract. These include yogurt and fermented milk products.   Wash your hands well after each diarrhea episode.   Only take over-the-counter or prescription medicines as directed by your health care provider.   Take a warm bath to relieve any burning or pain from frequent diarrhea episodes. SEEK MEDICAL CARE IF:   You are not urinating as often.  Your urine is a dark color.   You become very tired or dizzy.   You have severe pain in the abdomen or rectum.   Your have blood or pus in your stools.   Your stools look black and tarry.  SEEK IMMEDIATE MEDICAL CARE IF:   You are unable to keep fluids down.   You have persistent vomiting.   You have blood in your stool.  Your stools are black and tarry.   You do not urinate in 6 8 hours, or there is only a small amount of very dark urine.   You have abdominal pain that increases or localizes.   You have weakness, dizziness, confusion, or lightheadedness.   You have a severe headache.   Your diarrhea gets worse or does not get better.   You have a fever or persistent symptoms for more than 2 3 days.   You have a fever and your symptoms suddenly get worse.  MAKE SURE YOU:   Understand these instructions.  Will watch your condition.  Will get help right away if you are not doing well or get worse. Document Released: 12/28/2003 Document Revised: 06/09/2013 Document Reviewed: 04/01/2013 [facility] Patient Information 2014 [employer], [place].

## 2014-02-04 NOTE — ED Provider Notes (Signed)
CSN: [id number]     Arrival date & time 02/04/14  1423 History   First MD Initiated Contact with Patient 02/04/14 1738     Chief Complaint  Patient presents with  . Knee Pain  . Diarrhea     (Consider location/radiation/quality/duration/timing/severity/associated sxs/prior Treatment) HPI Comments: Patient presents emergency department with chief complaint of right knee pain, and diarrhea. She states that she has been doing with both the symptoms for several months. She states that she is in between health care providers at this time. She states that the knee pain has recently worsened. She denies any mechanism of injury. She describes the pain as sharp and stabbing. She has not tried taking anything to alleviate the symptoms. She states that she does have a followup appointment scheduled on May 1 with a new PCP. Additionally, she complains of having chronic diarrhea since last September. She states that she had several episodes of diarrhea per day.  The diarrhea is non-bloody, non-bilious.  She states that she has been seen by GI, but is waiting for PCP and insurance to kick in before getting a scope.  She has tried taking imodium with some relief.    The history is provided by the patient. No language interpreter was used.    Past Medical History  Diagnosis Date  . Anemia   . GERD (gastroesophageal reflux disease)   . Depression   . Anxiety   . 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 Hypertension     maintained on lisinopril hct, metoprolol-134/86 at pat visit  . Neuropathy   . Arthritis   . Hyperlipidemia   . Neuromuscular disorder   . Substance abuse   . Chronic headache   . Fibromyalgia   . Obesity    Past Surgical History  Procedure Laterality Date  . Cesarean section  x3  . Abdominal hysterectomy  10/30/2011    Procedure: HYSTERECTOMY ABDOMINAL;  Surgeon: Bing Plumehomas F Henley, MD;  Location: [facility];   Service: Gynecology;  Laterality: N/A;  . Salpingoophorectomy  10/30/2011    Procedure: SALPINGO OOPHERECTOMY;  Surgeon: Bing Plumehomas F Henley, MD;  Location: [facility];  Service: Gynecology;  Laterality: Right;  . Cholecystectomy N/A 06/28/2013    Procedure: LAPAROSCOPIC CHOLECYSTECTOMY;  Surgeon: Shelly Rubensteinouglas A Blackman, MD;  Location: [facility];  Service: General;  Laterality: N/A;   Family History  Problem Relation Age of Onset  . Cystic fibrosis Father   . Diabetes Mother   . Diabetes Maternal Grandmother   . Heart disease Maternal Grandfather    History  Substance Use Topics  . Smoking status: Former Smoker    Quit date: 10/24/1997  . Smokeless tobacco: Never Used  . Alcohol Use: No   OB History   Grav Para Term Preterm Abortions TAB SAB Ect Mult Living   3 3 3       3      Review of Systems  Constitutional: Negative for fever and chills.  Respiratory: Negative for shortness of breath.   Cardiovascular: Negative for chest pain.  Gastrointestinal: Positive for diarrhea. Negative for nausea, vomiting and constipation.  Genitourinary: Negative for dysuria.  Musculoskeletal: Positive for arthralgias.      Allergies  Codeine; Hydrocodone; and Percocet  Home Medications   Prior to Admission medications   Medication Sig Start Date End Date Taking? Authorizing Provider  albuterol (PROVENTIL HFA;VENTOLIN HFA) 108 (90 BASE) MCG/ACT inhaler Inhale 2 puffs into the  lungs every 6 (six) hours as needed for wheezing or shortness of breath.     Historical Provider, MD  atenolol-chlorthalidone (TENORETIC) 100-25 MG per tablet Take 1 tablet by mouth daily.    Historical Provider, MD  cholestyramine Lanetta Inch) 4 G packet Take 1 packet (4 g total) by mouth 3 (three) times daily with meals. 10/07/13   Shelly Rubenstein, MD  FLUoxetine (PROZAC) 40 MG capsule Take 40 mg by mouth daily.    Historical Provider, MD  gabapentin (NEURONTIN) 300 MG capsule Take 300 mg by mouth 4 (four) times daily.    Historical  Provider, MD  hydrOXYzine (ATARAX/VISTARIL) 50 MG tablet Take 50 mg by mouth 2 (two) times daily.    Historical Provider, MD  lisinopril (PRINIVIL,ZESTRIL) 20 MG tablet Take 20 mg by mouth daily.    Historical Provider, MD  loperamide (IMODIUM) 2 MG capsule Take 2 mg by mouth as needed for diarrhea or loose stools.    Historical Provider, MD  metroNIDAZOLE (FLAGYL) 500 MG tablet Take 1 tablet (500 mg total) by mouth 2 (two) times daily. 11/24/13   Meryl Dare, MD  mometasone-formoterol (DULERA) 100-5 MCG/ACT AERO Inhale 2 puffs into the lungs 2 (two) times daily.    Historical Provider, MD  zolpidem (AMBIEN) 10 MG tablet Take 10 mg by mouth at bedtime as needed for sleep.    Historical Provider, MD   BP 121/97  Pulse 119  Temp(Src) 98.7 F (37.1 C) (Oral)  Resp 16  Ht 5\' 5"  (1.651 m)  Wt 215 lb (97.523 kg)  BMI 35.78 kg/m2  SpO2 98%  LMP 10/07/2011 Physical Exam  Nursing note and vitals reviewed. Constitutional: She is oriented to person, place, and time. She appears well-developed and well-nourished.  HENT:  Head: Normocephalic and atraumatic.  Eyes: Conjunctivae and EOM are normal. Pupils are equal, round, and reactive to light.  Neck: Normal range of motion. Neck supple.  Cardiovascular: Normal rate and regular rhythm.  Exam reveals no gallop and no friction rub.   No murmur heard. Pulmonary/Chest: Effort normal and breath sounds normal. No respiratory distress. She has no wheezes. She has no rales. She exhibits no tenderness.  Abdominal: Soft. She exhibits no distension and no mass. There is no tenderness. There is no rebound and no guarding.  No focal abdominal tenderness, no RLQ tenderness or pain at McBurney's point, no RUQ tenderness or Murphy's sign, no left-sided abdominal tenderness, no fluid wave, or signs of peritonitis   Musculoskeletal: Normal range of motion. She exhibits no edema and no tenderness.  Right knee ROM and strength 5/5, no evidence of DVT or septic  joint, no erythema or swelling  Neurological: She is alert and oriented to person, place, and time.  Skin: Skin is warm and dry.  Psychiatric: She has a normal mood and affect. Her behavior is normal. Judgment and thought content normal.    ED Course  Procedures (including critical care time) Results for orders placed during the hospital encounter of 02/04/14  COMPREHENSIVE METABOLIC PANEL      Result Value Ref Range   Sodium 135 (*) 137 - 147 mEq/L   Potassium 4.1  3.7 - 5.3 mEq/L   Chloride 96  96 - 112 mEq/L   CO2 21  19 - 32 mEq/L   Glucose, Bld 379 (*) 70 - 99 mg/dL   BUN 5 (*) 6 - 23 mg/dL   Creatinine, Ser 3.01  0.50 - 1.10 mg/dL   Calcium 60.1  8.4 -  10.5 mg/dL   Total Protein 7.9  6.0 - 8.3 g/dL   Albumin 4.0  3.5 - 5.2 g/dL   AST 92 (*) 0 - 37 U/L   ALT 72 (*) 0 - 35 U/L   Alkaline Phosphatase 87  39 - 117 U/L   Total Bilirubin 0.3  0.3 - 1.2 mg/dL   GFR calc non Af Amer >90  >90 mL/min   GFR calc Af Amer >90  >90 mL/min  CBC WITH DIFFERENTIAL      Result Value Ref Range   WBC 8.0  4.0 - 10.5 K/uL   RBC 4.83  3.87 - 5.11 MIL/uL   Hemoglobin 12.6  12.0 - 15.0 g/dL   HCT 19.1  47.8 - 29.5 %   MCV 80.3  78.0 - 100.0 fL   MCH 26.1  26.0 - 34.0 pg   MCHC 32.5  30.0 - 36.0 g/dL   RDW 62.1  30.8 - 65.7 %   Platelets 277  150 - 400 K/uL   Neutrophils Relative % 56  43 - 77 %   Neutro Abs 4.4  1.7 - 7.7 K/uL   Lymphocytes Relative 39  12 - 46 %   Lymphs Abs 3.1  0.7 - 4.0 K/uL   Monocytes Relative 5  3 - 12 %   Monocytes Absolute 0.4  0.1 - 1.0 K/uL   Eosinophils Relative 1  0 - 5 %   Eosinophils Absolute 0.1  0.0 - 0.7 K/uL   Basophils Relative 0  0 - 1 %   Basophils Absolute 0.0  0.0 - 0.1 K/uL  URINALYSIS, ROUTINE W REFLEX MICROSCOPIC      Result Value Ref Range   Color, Urine YELLOW  YELLOW   APPearance CLEAR  CLEAR   Specific Gravity, Urine 1.045 (*) 1.005 - 1.030   pH 5.5  5.0 - 8.0   Glucose, UA >1000 (*) NEGATIVE mg/dL   Hgb urine dipstick NEGATIVE   NEGATIVE   Bilirubin Urine NEGATIVE  NEGATIVE   Ketones, ur NEGATIVE  NEGATIVE mg/dL   Protein, ur NEGATIVE  NEGATIVE mg/dL   Urobilinogen, UA 0.2  0.0 - 1.0 mg/dL   Nitrite NEGATIVE  NEGATIVE   Leukocytes, UA NEGATIVE  NEGATIVE  URINE MICROSCOPIC-ADD ON      Result Value Ref Range   Squamous Epithelial / LPF RARE  RARE   WBC, UA 0-2  <3 WBC/hpf   RBC / HPF 0-2  <3 RBC/hpf   Bacteria, UA RARE  RARE   Urine-Other FEW YEAST    CBG MONITORING, ED      Result Value Ref Range   Glucose-Capillary 298 (*) 70 - 99 mg/dL  CBG MONITORING, ED      Result Value Ref Range   Glucose-Capillary 280 (*) 70 - 99 mg/dL   Dg Knee Complete 4 Views Right  02/04/2014   CLINICAL DATA:  Pain  EXAM: RIGHT KNEE - COMPLETE 4+ VIEW  COMPARISON:  Right knee MRI March 22, 2008  FINDINGS: Frontal, lateral, and bilateral oblique views were obtained. There is no fracture, dislocation, or effusion. Joint spaces appear intact. No erosive change.  IMPRESSION: No abnormality noted.   Electronically Signed   By: Bretta Bang M.D.   On: 02/04/2014 19:44     Imaging Review No results found.   EKG Interpretation None      MDM   Final diagnoses:  Knee pain  Diarrhea      8:38 PM Patient's pain is improved in  the ED.  Labs are near baseline.  Will no acute abdomen.  Will discharge to home with PCP follow-up.  9:02 PM Patient is feeling better.  Asks for an additional dose of pain medicine prior to discharge.  Patient has close follow-up now.  Recommend PCP follow up.  Filed Vitals:   02/04/14 2052  BP: 127/79  Pulse: 86  Temp:   Resp:      Roxy HorsemanRobert Anelia Carriveau, PA-C 02/04/14 2151

## 2014-02-04 NOTE — ED Notes (Addendum)
She states shes been having R knee pain and diarrhea. She denies any injuries but reports she has chronic pain and this is why her knee hurts. Cms intact. She states her doctor has scheduled for her to have a colonoscopy for the diarrhea but diarrhea is getting worse. shes been off all her daily meds since February because she lost her insurance

## 2014-02-04 NOTE — ED Notes (Signed)
Patient endorsing nausea at present time. Rob PA made aware.

## 2014-02-06 NOTE — ED Provider Notes (Signed)
Medical screening examination/treatment/procedure(s) were performed by non-physician practitioner and as supervising physician I was immediately available for consultation/collaboration.   EKG Interpretation None        Saahir Prude W. Cristobal Advani, MD 02/06/14 1540 

## 2014-04-13 ENCOUNTER — Emergency Department (HOSPITAL_COMMUNITY)
Admission: EM | Admit: 2014-04-13 | Discharge: 2014-04-13 | Disposition: A | Discharge status: Home / Self Care | Payer: No Typology Code available for payment source | Attending: Emergency Medicine | Admitting: Emergency Medicine

## 2014-04-13 ENCOUNTER — Encounter (HOSPITAL_COMMUNITY): Payer: Self-pay | Admitting: Emergency Medicine

## 2014-04-13 ENCOUNTER — Emergency Department (HOSPITAL_COMMUNITY): Payer: No Typology Code available for payment source

## 2014-04-13 DIAGNOSIS — IMO0001 Reserved for inherently not codable concepts without codable children: Secondary | ICD-10-CM | POA: Insufficient documentation

## 2014-04-13 DIAGNOSIS — F3289 Other specified depressive episodes: Secondary | ICD-10-CM | POA: Insufficient documentation

## 2014-04-13 DIAGNOSIS — R55 Syncope and collapse: Secondary | ICD-10-CM | POA: Insufficient documentation

## 2014-04-13 DIAGNOSIS — E669 Obesity, unspecified: Secondary | ICD-10-CM | POA: Insufficient documentation

## 2014-04-13 DIAGNOSIS — F329 Major depressive disorder, single episode, unspecified: Secondary | ICD-10-CM | POA: Insufficient documentation

## 2014-04-13 DIAGNOSIS — Z8669 Personal history of other diseases of the nervous system and sense organs: Secondary | ICD-10-CM | POA: Insufficient documentation

## 2014-04-13 DIAGNOSIS — Z862 Personal history of diseases of the blood and blood-forming organs and certain disorders involving the immune mechanism: Secondary | ICD-10-CM | POA: Insufficient documentation

## 2014-04-13 DIAGNOSIS — Z79899 Other long term (current) drug therapy: Secondary | ICD-10-CM | POA: Insufficient documentation

## 2014-04-13 DIAGNOSIS — E119 Type 2 diabetes mellitus without complications: Secondary | ICD-10-CM | POA: Insufficient documentation

## 2014-04-13 DIAGNOSIS — Z87891 Personal history of nicotine dependence: Secondary | ICD-10-CM | POA: Insufficient documentation

## 2014-04-13 DIAGNOSIS — IMO0002 Reserved for concepts with insufficient information to code with codable children: Secondary | ICD-10-CM | POA: Insufficient documentation

## 2014-04-13 DIAGNOSIS — E785 Hyperlipidemia, unspecified: Secondary | ICD-10-CM | POA: Insufficient documentation

## 2014-04-13 DIAGNOSIS — Z8739 Personal history of other diseases of the musculoskeletal system and connective tissue: Secondary | ICD-10-CM | POA: Insufficient documentation

## 2014-04-13 DIAGNOSIS — I10 Essential (primary) hypertension: Secondary | ICD-10-CM | POA: Insufficient documentation

## 2014-04-13 DIAGNOSIS — F411 Generalized anxiety disorder: Secondary | ICD-10-CM | POA: Insufficient documentation

## 2014-04-13 DIAGNOSIS — R51 Headache: Secondary | ICD-10-CM | POA: Insufficient documentation

## 2014-04-13 DIAGNOSIS — Z794 Long term (current) use of insulin: Secondary | ICD-10-CM | POA: Insufficient documentation

## 2014-04-13 DIAGNOSIS — K219 Gastro-esophageal reflux disease without esophagitis: Secondary | ICD-10-CM | POA: Insufficient documentation

## 2014-04-13 LAB — BASIC METABOLIC PANEL
BUN: 7 mg/dL (ref 6–23)
CO2: 23 meq/L (ref 19–32)
Calcium: 10.4 mg/dL (ref 8.4–10.5)
Chloride: 101 mEq/L (ref 96–112)
Creatinine, Ser: 0.52 mg/dL (ref 0.50–1.10)
GFR calc Af Amer: >90 mL/min (ref >90)
GFR calc non Af Amer: >90 mL/min (ref >90)
Glucose, Bld: 238 mg/dL — ABNORMAL HIGH (ref 70–99)
Potassium: 4.1 mEq/L (ref 3.7–5.3)
SODIUM: 139 meq/L (ref 137–147)

## 2014-04-13 LAB — CBC
HEMATOCRIT: 39.1 % (ref 36.0–46.0)
Hemoglobin: 12.4 g/dL (ref 12.0–15.0)
MCH: 26.1 pg (ref 26.0–34.0)
MCHC: 31.7 g/dL (ref 30.0–36.0)
MCV: 82.3 fL (ref 78.0–100.0)
Platelets: 282 10*3/uL (ref 150–400)
RBC: 4.75 MIL/uL (ref 3.87–5.11)
RDW: 15.2 % (ref 11.5–15.5)
WBC: 7.9 10*3/uL (ref 4.0–10.5)

## 2014-04-13 LAB — I-STAT CG4 LACTIC ACID, ED: Lactic Acid, Venous: 1.37 mmol/L (ref 0.5–2.2)

## 2014-04-13 MED ORDER — HYDROMORPHONE HCL PF 1 MG/ML IJ SOLN
1.0000 mg | Freq: Once | INTRAMUSCULAR | Status: AC
  Administered 2014-04-13: 1 mg via INTRAMUSCULAR
  Filled 2014-04-13: qty 1

## 2014-04-13 MED ORDER — SODIUM CHLORIDE 0.9 % IV BOLUS (SEPSIS)
1000.0000 mL | Freq: Once | INTRAVENOUS | Status: AC
  Administered 2014-04-13: 1000 mL via INTRAVENOUS

## 2014-04-13 NOTE — ED Provider Notes (Signed)
CSN: [id number]     Arrival date & time 04/13/14  0400 History   First MD Initiated Contact with Patient 04/13/14 0415     Chief Complaint  Patient presents with  . Loss of Consciousness     (Consider location/radiation/quality/duration/timing/severity/associated sxs/prior Treatment) Patient is a 43 y.o. female presenting with syncope. The history is provided by the patient.  Loss of Consciousness Episode history:  Single Most recent episode:  Today Duration:  1 minute Timing:  Constant Progression:  Resolved Chronicity:  Recurrent Context: standing up (had stood from sitting on the toilet, was standing for about one minute)   Witnessed: no   Relieved by:  Nothing Worsened by:  Nothing tried Ineffective treatments:  None tried Associated symptoms: malaise/fatigue and nausea   Associated symptoms: no chest pain, no fever and no vomiting     Past Medical History  Diagnosis Date  . Anemia   . GERD (gastroesophageal reflux disease)   . Depression   . Anxiety   . 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 Hypertension     maintained on lisinopril hct, metoprolol-134/86 at pat visit  . Neuropathy   . Arthritis   . Hyperlipidemia   . Neuromuscular disorder   . Substance abuse   . Chronic headache   . Fibromyalgia   . Obesity    Past Surgical History  Procedure Laterality Date  . Cesarean section  x3  . Abdominal hysterectomy  10/30/2011    Procedure: HYSTERECTOMY ABDOMINAL;  Surgeon: Bing Plumehomas F Henley, MD;  Location: [facility];  Service: Gynecology;  Laterality: N/A;  . Salpingoophorectomy  10/30/2011    Procedure: SALPINGO OOPHERECTOMY;  Surgeon: Bing Plumehomas F Henley, MD;  Location: [facility];  Service: Gynecology;  Laterality: Right;  . Cholecystectomy N/A 06/28/2013    Procedure: LAPAROSCOPIC CHOLECYSTECTOMY;  Surgeon: Shelly Rubensteinouglas A Blackman, MD;  Location: [facility];  Service: General;  Laterality: N/A;   Family History   Problem Relation Age of Onset  . Cystic fibrosis Father   . Diabetes Mother   . Diabetes Maternal Grandmother   . Heart disease Maternal Grandfather    History  Substance Use Topics  . Smoking status: Former Smoker    Quit date: 10/24/1997  . Smokeless tobacco: Never Used  . Alcohol Use: No   OB History   Grav Para Term Preterm Abortions TAB SAB Ect Mult Living   3 3 3       3      Review of Systems  Constitutional: Positive for malaise/fatigue. Negative for fever.  Cardiovascular: Positive for syncope. Negative for chest pain.  Gastrointestinal: Positive for nausea. Negative for vomiting.  All other systems reviewed and are negative.     Allergies  Codeine; Hydrocodone; and Percocet  Home Medications   Prior to Admission medications   Medication Sig Start Date End Date Taking? Authorizing Provider  FLUoxetine (PROZAC) 40 MG capsule Take 40 mg by mouth daily.   Yes Historical Provider, MD  gabapentin (NEURONTIN) 300 MG capsule Take 300 mg by mouth 2 (two) times daily.    Yes Historical Provider, MD  hydrOXYzine (ATARAX/VISTARIL) 50 MG tablet Take 50 mg by mouth at bedtime.    Yes Historical Provider, MD  insulin aspart protamine- aspart (NOVOLOG MIX 70/30) (70-30) 100 UNIT/ML injection Inject 60 Units into the skin 2 (two) times daily with a meal.   Yes Historical Provider, MD  lisinopril (PRINIVIL,ZESTRIL) 20 MG tablet Take  20 mg by mouth daily.   Yes Historical Provider, MD  lovastatin (MEVACOR) 20 MG tablet Take 20 mg by mouth at bedtime.   Yes Historical Provider, MD  metFORMIN (GLUCOPHAGE) 500 MG tablet Take 1,000 mg by mouth 2 (two) times daily with a meal.   Yes Historical Provider, MD  mometasone (NASONEX) 50 MCG/ACT nasal spray Place 2 sprays into the nose daily.   Yes Historical Provider, MD  omeprazole (PRILOSEC) 20 MG capsule Take 20 mg by mouth daily.   Yes Historical Provider, MD  promethazine (PHENERGAN) 25 MG tablet Take 25 mg by mouth every 6 (six) hours  as needed for nausea or vomiting.   Yes Historical Provider, MD  rOPINIRole (REQUIP) 3 MG tablet Take 3 mg by mouth at bedtime.   Yes Historical Provider, MD  zolpidem (AMBIEN) 10 MG tablet Take 10 mg by mouth at bedtime as needed for sleep.   Yes Historical Provider, MD   BP 145/88  Pulse 90  Temp(Src) 97.4 F (36.3 C) (Oral)  Resp 17  Ht 5\' 5"  (1.651 m)  Wt 204 lb (92.534 kg)  BMI 33.95 kg/m2  SpO2 99%  LMP 10/07/2011 Physical Exam  Nursing note and vitals reviewed. Constitutional: She is oriented to person, place, and time. She appears well-developed and well-nourished. No distress.  HENT:  Head: Normocephalic and atraumatic.  Eyes: EOM are normal. Pupils are equal, round, and reactive to light.  Neck: Normal range of motion. Neck supple.  Cardiovascular: Normal rate and regular rhythm.  Exam reveals no friction rub.   No murmur heard. Pulmonary/Chest: Effort normal and breath sounds normal. No respiratory distress. She has no wheezes. She has no rales.  Abdominal: Soft. She exhibits no distension. There is no tenderness. There is no rebound.  Musculoskeletal: Normal range of motion. She exhibits no edema.       Left knee: She exhibits normal range of motion, no swelling and no effusion. Tenderness (mild diffuse) found.       Left ankle: She exhibits normal range of motion and no swelling. Tenderness (mild diffuse).  Neurological: She is alert and oriented to person, place, and time.  Skin: She is not diaphoretic.    ED Course  Procedures (including critical care time) Labs Review Labs Reviewed  BASIC METABOLIC PANEL - Abnormal; Notable for the following:    Glucose, Bld 238 (*)    All other components within normal limits  CBC  I-STAT CG4 LACTIC ACID, ED    Imaging Review Dg Knee 2 Views Left  04/13/2014   CLINICAL DATA:  Left ankle and medial knee pain after a fall.  EXAM: LEFT KNEE - 1-2 VIEW  COMPARISON:  None.  FINDINGS: There is no evidence of fracture,  dislocation, or joint effusion. There is no evidence of arthropathy or other focal bone abnormality. Soft tissues are unremarkable.  IMPRESSION: Negative.   Electronically Signed   By: Burman NievesWilliam  Stevens M.D.   On: 04/13/2014 06:54   Dg Ankle Complete Left  04/13/2014   CLINICAL DATA:  Left ankle and knee pain after a fall.  EXAM: LEFT ANKLE COMPLETE - 3+ VIEW  COMPARISON:  None.  FINDINGS: There is no evidence of fracture, dislocation, or joint effusion. There is no evidence of arthropathy or other focal bone abnormality. Soft tissues are unremarkable.  IMPRESSION: Negative.   Electronically Signed   By: Burman NievesWilliam  Stevens M.D.   On: 04/13/2014 06:55     EKG Interpretation   Date/Time:  Wednesday April 13 2014 04:20:49 EDT Ventricular Rate:  88 PR Interval:  142 QRS Duration: 80 QT Interval:  368 QTC Calculation: 445 R Axis:   71 Text Interpretation:  Sinus rhythm Similar to prior Confirmed by Gwendolyn Grant   MD, BLAIR ([id number]) on 04/13/2014 4:45:45 AM      MDM   Final diagnoses:  Syncope, unspecified syncope type    80F presents with syncope. Has passed out once before 2 years ago. Was sitting on the toilet taking her blood pressure and then stood up to the bathroom. A minute or 2 later she passed out for 1 minute. No seizure-like activity. No proceeding chest pain, shortness of breath. She did state she felt tired and some nausea earlier that day/. Here vitals stable. EKG is okay. Exam is benign. She has some mild left knee and left ankle tenderness with negative x-rays. After fluids she is feeling much better. Basic labs normal. She stable for discharge. I think her syncope is likely orthostatic as she was sitting down and had a syncopal event after standing for a few  Dagmar Hait, MD 04/13/14 [phone]

## 2014-04-13 NOTE — ED Notes (Signed)
Pt states that she does have a hx of syncope, states that is has been about 2 years since she has passed out before today.

## 2014-04-13 NOTE — ED Notes (Signed)
Patient coming from home where she was getting up to got o the restroom and had a syncopal episode. Patient fell to her knees then laid down on floor. Denies hitting head and states she was unconscious for about a minute. Patient was a/o x4 upon EMS arrival and patient was sitting in bedroom floor. Patient skin warm and dry with EMS. Patient complaining of right knee pain and left ankle pain. Patient has hx of HTN and her lisinopril dose was just increased on the 22nd of June. BP 170/100, HR 80's Sinus rhythm.

## 2014-07-16 ENCOUNTER — Emergency Department (HOSPITAL_COMMUNITY)
Admission: EM | Admit: 2014-07-16 | Discharge: 2014-07-16 | Disposition: A | Discharge status: Home / Self Care | Payer: No Typology Code available for payment source | Attending: Emergency Medicine | Admitting: Emergency Medicine

## 2014-07-16 ENCOUNTER — Encounter (HOSPITAL_COMMUNITY): Payer: Self-pay | Admitting: Emergency Medicine

## 2014-07-16 DIAGNOSIS — I10 Essential (primary) hypertension: Secondary | ICD-10-CM | POA: Insufficient documentation

## 2014-07-16 DIAGNOSIS — F329 Major depressive disorder, single episode, unspecified: Secondary | ICD-10-CM | POA: Insufficient documentation

## 2014-07-16 DIAGNOSIS — F411 Generalized anxiety disorder: Secondary | ICD-10-CM | POA: Insufficient documentation

## 2014-07-16 DIAGNOSIS — G589 Mononeuropathy, unspecified: Secondary | ICD-10-CM | POA: Diagnosis not present

## 2014-07-16 DIAGNOSIS — K219 Gastro-esophageal reflux disease without esophagitis: Secondary | ICD-10-CM | POA: Diagnosis not present

## 2014-07-16 DIAGNOSIS — M542 Cervicalgia: Secondary | ICD-10-CM | POA: Insufficient documentation

## 2014-07-16 DIAGNOSIS — E119 Type 2 diabetes mellitus without complications: Secondary | ICD-10-CM | POA: Diagnosis not present

## 2014-07-16 DIAGNOSIS — M129 Arthropathy, unspecified: Secondary | ICD-10-CM | POA: Diagnosis not present

## 2014-07-16 DIAGNOSIS — Z862 Personal history of diseases of the blood and blood-forming organs and certain disorders involving the immune mechanism: Secondary | ICD-10-CM | POA: Insufficient documentation

## 2014-07-16 DIAGNOSIS — Z79899 Other long term (current) drug therapy: Secondary | ICD-10-CM | POA: Diagnosis not present

## 2014-07-16 DIAGNOSIS — IMO0002 Reserved for concepts with insufficient information to code with codable children: Secondary | ICD-10-CM | POA: Diagnosis not present

## 2014-07-16 DIAGNOSIS — J029 Acute pharyngitis, unspecified: Secondary | ICD-10-CM | POA: Diagnosis not present

## 2014-07-16 DIAGNOSIS — E669 Obesity, unspecified: Secondary | ICD-10-CM | POA: Diagnosis not present

## 2014-07-16 DIAGNOSIS — F3289 Other specified depressive episodes: Secondary | ICD-10-CM | POA: Insufficient documentation

## 2014-07-16 DIAGNOSIS — Z87891 Personal history of nicotine dependence: Secondary | ICD-10-CM | POA: Insufficient documentation

## 2014-07-16 DIAGNOSIS — E785 Hyperlipidemia, unspecified: Secondary | ICD-10-CM | POA: Insufficient documentation

## 2014-07-16 DIAGNOSIS — G8929 Other chronic pain: Secondary | ICD-10-CM | POA: Insufficient documentation

## 2014-07-16 DIAGNOSIS — Z794 Long term (current) use of insulin: Secondary | ICD-10-CM | POA: Insufficient documentation

## 2014-07-16 MED ORDER — HYDROCODONE-ACETAMINOPHEN 5-325 MG PO TABS
1.0000 | ORAL_TABLET | ORAL | Status: DC | PRN
Start: 2014-07-16 — End: 2014-09-20

## 2014-07-16 MED ORDER — HYDROCODONE-ACETAMINOPHEN 5-325 MG PO TABS
2.0000 | ORAL_TABLET | Freq: Once | ORAL | Status: AC
  Administered 2014-07-16: 2 via ORAL
  Filled 2014-07-16: qty 2

## 2014-07-16 MED ORDER — GABAPENTIN 600 MG PO TABS: 600.0000 mg | ORAL_TABLET | Freq: Three times a day (TID) | ORAL | Status: DC

## 2014-07-16 NOTE — ED Notes (Signed)
Dr Fayrene Fearing at bedside evaluating pt for SI.

## 2014-07-16 NOTE — Discharge Instructions (Signed)

## 2014-07-16 NOTE — ED Provider Notes (Signed)
CSN: [id number]     Arrival date & time 07/16/14  4098 History   First MD Initiated Contact with Patient 07/16/14 0915     Chief Complaint  Patient presents with  . Pain      HPI  Vision presents with several complaints. Primarily she states she has exacerbation of chronic neck and back pain. She took her last dose of her pain medication yesterday. States that she has a pain medicine appointment that is the first time appointment in October. Also complains of sore throat and right earache. Nursing had a discussion with her that she is fairly depressed. She denies being suicidal.  Past Medical History  Diagnosis Date  . Anemia   . GERD (gastroesophageal reflux disease)   . Depression   . Anxiety   . 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 Hypertension     maintained on lisinopril hct, metoprolol-134/86 at pat visit  . Neuropathy   . Arthritis   . Hyperlipidemia   . Neuromuscular disorder   . Substance abuse   . Chronic headache   . Fibromyalgia   . Obesity    Past Surgical History  Procedure Laterality Date  . Cesarean section  x3  . Abdominal hysterectomy  10/30/2011    Procedure: HYSTERECTOMY ABDOMINAL;  Surgeon: Bing Plume, MD;  Location: [facility];  Service: Gynecology;  Laterality: N/A;  . Salpingoophorectomy  10/30/2011    Procedure: SALPINGO OOPHERECTOMY;  Surgeon: Bing Plume, MD;  Location: [facility];  Service: Gynecology;  Laterality: Right;  . Cholecystectomy N/A 06/28/2013    Procedure: LAPAROSCOPIC CHOLECYSTECTOMY;  Surgeon: Shelly Rubenstein, MD;  Location: [facility];  Service: General;  Laterality: N/A;  . Wrist surgery      carpel tunnel and tendonitis   Family History  Problem Relation Age of Onset  . Cystic fibrosis Father   . Diabetes Mother   . Diabetes Maternal Grandmother   . Heart disease Maternal Grandfather    History  Substance Use Topics  . Smoking status: Former Smoker     Quit date: 10/24/1997  . Smokeless tobacco: Never Used  . Alcohol Use: No   OB History   Grav Para Term Preterm Abortions TAB SAB Ect Mult Living   Review of Systems  Constitutional: Negative for fever, chills, diaphoresis, appetite change and fatigue.  HENT: Positive for congestion, postnasal drip and sore throat. Negative for mouth sores and trouble swallowing.   Eyes: Negative for visual disturbance.  Respiratory: Negative for cough, chest tightness, shortness of breath and wheezing.   Cardiovascular: Negative for chest pain.  Gastrointestinal: Negative for nausea, vomiting, abdominal pain, diarrhea and abdominal distention.  Endocrine: Negative for polydipsia, polyphagia and polyuria.  Genitourinary: Negative for dysuria, frequency and hematuria.  Musculoskeletal: Positive for back pain and neck pain. Negative for gait problem.  Skin: Negative for color change, pallor and rash.  Neurological: Negative for dizziness, syncope, light-headedness and headaches.  Hematological: Does not bruise/bleed easily.  Psychiatric/Behavioral: Negative for behavioral problems and confusion.      Allergies  Percocet  Home Medications   Prior to Admission medications   Medication Sig Start Date End Date Taking? Authorizing Provider  FLUoxetine (PROZAC) 40 MG capsule Take 40 mg by mouth daily.   Yes Historical Provider, MD  fluticasone (FLOVENT HFA) 44 MCG/ACT inhaler Inhale 2 puffs  into the lungs 2 (two) times daily.   Yes Historical Provider, MD  gabapentin (NEURONTIN) 300 MG capsule Take 300 mg by mouth 2 (two) times daily.    Yes Historical Provider, MD  HYDROcodone-acetaminophen (NORCO/VICODIN) 5-325 MG per tablet Take 1 tablet by mouth every 4 (four) hours as needed for moderate pain.   Yes Historical Provider, MD  hydrOXYzine (ATARAX/VISTARIL) 50 MG tablet Take 50 mg by mouth at bedtime.    Yes Historical Provider, MD  ibuprofen (ADVIL,MOTRIN) 200 MG tablet Take 800  mg by mouth every 8 (eight) hours as needed (pain).   Yes Historical Provider, MD  insulin aspart protamine- aspart (NOVOLOG MIX 70/30) (70-30) 100 UNIT/ML injection Inject 65 Units into the skin 2 (two) times daily with a meal.    Yes Historical Provider, MD  lisinopril (PRINIVIL,ZESTRIL) 20 MG tablet Take 20 mg by mouth daily.   Yes Historical Provider, MD  lovastatin (MEVACOR) 20 MG tablet Take 20 mg by mouth at bedtime.   Yes Historical Provider, MD  metFORMIN (GLUCOPHAGE-XR) 500 MG 24 hr tablet Take 2,000 mg by mouth daily with breakfast.   Yes Historical Provider, MD  mometasone (NASONEX) 50 MCG/ACT nasal spray Place 2 sprays into the nose daily.   Yes Historical Provider, MD  omeprazole (PRILOSEC) 20 MG capsule Take 20 mg by mouth daily.   Yes Historical Provider, MD  promethazine (PHENERGAN) 25 MG tablet Take 25 mg by mouth every 6 (six) hours as needed for nausea or vomiting.   Yes Historical Provider, MD  rOPINIRole (REQUIP) 3 MG tablet Take 3 mg by mouth at bedtime.   Yes Historical Provider, MD  zolpidem (AMBIEN) 10 MG tablet Take 10 mg by mouth at bedtime as needed for sleep.   Yes Historical Provider, MD  HYDROcodone-acetaminophen (NORCO/VICODIN) 5-325 MG per tablet Take 1 tablet by mouth every 4 (four) hours as needed. 07/16/14   Rolland Porter, MD   BP 168/98  Pulse 92  Temp(Src) 98.2 F (36.8 C) (Oral)  Resp 16  SpO2 100%  LMP 10/07/2011 Physical Exam  Constitutional: She is oriented to person, place, and time. She appears well-developed and well-nourished. No distress.  HENT:  Head: Normocephalic.  Normal-appearing pharynx. No abnormalities of the TMs.  Eyes: Conjunctivae are normal. Pupils are equal, round, and reactive to light. No scleral icterus.  Neck: Normal range of motion. Neck supple. No thyromegaly present.  Cardiovascular: Normal rate and regular rhythm.  Exam reveals no gallop and no friction rub.   No murmur heard. Pulmonary/Chest: Effort normal and breath sounds  normal. No respiratory distress. She has no wheezes. She has no rales.  Abdominal: Soft. Bowel sounds are normal. She exhibits no distension. There is no tenderness. There is no rebound.  Musculoskeletal: Normal range of motion.  Neurological: She is alert and oriented to person, place, and time.  And Lipitor with normal gait. No strength or motor deficits. No sensory deficits. Normal cranial nerves.  Skin: Skin is warm and dry. No rash noted.  Psychiatric: She has a normal mood and affect. Her behavior is normal.    ED Course  Procedures (including critical care time) Labs Review Labs Reviewed - No data to display  Imaging Review No results found.   EKG Interpretation None      MDM   Final diagnoses:  Chronic pain    Long discussion with the patient. She is not concerned that she would do anything for herself. She admits that she is depressed. She has never  been the point of considering her planning a suicide. She states that she knows that times daily x2 never hurt his after she dies. She states she has no plans to make that happened. Uncomfortable with her explanation regarding this. She has a pain management physician appointment on the sixth. 100 her prescription for a limited amount of pain medication. Bastard be certain to take her bottle with her to her physician's appointment to be up front about the medication she is receiving. We will increase her Neurontin to 600 3 times a day.    Rolland Porter, MD 07/16/14 1031

## 2014-07-16 NOTE — ED Notes (Signed)
Pt from home via [facility] with c/o increasing pain since this past Thurs.  Pt reports her hydrocodone, last dose midnight, has not been helping her chronic pain.  Pain location lower back and neck.  Pt has not taken any of her medications today, requesting something for nausea and Dilaudid.  Pt also states she has a sore throat and a right earache since yesterday morning.  Pt in NAD, A&O.

## 2014-08-05 ENCOUNTER — Other Ambulatory Visit: Payer: Self-pay

## 2014-08-22 ENCOUNTER — Encounter (HOSPITAL_COMMUNITY): Payer: Self-pay | Admitting: Emergency Medicine

## 2014-09-20 ENCOUNTER — Observation Stay (HOSPITAL_COMMUNITY)
Admission: AD | Admit: 2014-09-20 | Discharge: 2014-09-21 | Disposition: A | Discharge status: Home / Self Care | Payer: Medicaid Other | Source: Intra-hospital | Attending: Psychiatry | Admitting: Psychiatry

## 2014-09-20 ENCOUNTER — Emergency Department (HOSPITAL_COMMUNITY): Payer: Self-pay

## 2014-09-20 ENCOUNTER — Encounter (HOSPITAL_COMMUNITY): Payer: Self-pay | Admitting: *Deleted

## 2014-09-20 ENCOUNTER — Emergency Department (HOSPITAL_COMMUNITY)
Admission: EM | Admit: 2014-09-20 | Discharge: 2014-09-20 | Disposition: A | Discharge status: Home / Self Care | Payer: Self-pay | Attending: Emergency Medicine | Admitting: Emergency Medicine

## 2014-09-20 ENCOUNTER — Emergency Department (HOSPITAL_COMMUNITY): Payer: No Typology Code available for payment source

## 2014-09-20 ENCOUNTER — Encounter (HOSPITAL_COMMUNITY): Payer: Self-pay | Admitting: Emergency Medicine

## 2014-09-20 DIAGNOSIS — F329 Major depressive disorder, single episode, unspecified: Secondary | ICD-10-CM | POA: Insufficient documentation

## 2014-09-20 DIAGNOSIS — Z7952 Long term (current) use of systemic steroids: Secondary | ICD-10-CM | POA: Insufficient documentation

## 2014-09-20 DIAGNOSIS — M1990 Unspecified osteoarthritis, unspecified site: Secondary | ICD-10-CM | POA: Diagnosis not present

## 2014-09-20 DIAGNOSIS — M797 Fibromyalgia: Secondary | ICD-10-CM | POA: Insufficient documentation

## 2014-09-20 DIAGNOSIS — Z862 Personal history of diseases of the blood and blood-forming organs and certain disorders involving the immune mechanism: Secondary | ICD-10-CM | POA: Insufficient documentation

## 2014-09-20 DIAGNOSIS — E785 Hyperlipidemia, unspecified: Secondary | ICD-10-CM | POA: Insufficient documentation

## 2014-09-20 DIAGNOSIS — I10 Essential (primary) hypertension: Secondary | ICD-10-CM | POA: Diagnosis not present

## 2014-09-20 DIAGNOSIS — R51 Headache: Secondary | ICD-10-CM | POA: Insufficient documentation

## 2014-09-20 DIAGNOSIS — E669 Obesity, unspecified: Secondary | ICD-10-CM | POA: Insufficient documentation

## 2014-09-20 DIAGNOSIS — Z87891 Personal history of nicotine dependence: Secondary | ICD-10-CM | POA: Insufficient documentation

## 2014-09-20 DIAGNOSIS — F332 Major depressive disorder, recurrent severe without psychotic features: Principal | ICD-10-CM | POA: Insufficient documentation

## 2014-09-20 DIAGNOSIS — F32A Depression, unspecified: Secondary | ICD-10-CM

## 2014-09-20 DIAGNOSIS — Z794 Long term (current) use of insulin: Secondary | ICD-10-CM | POA: Insufficient documentation

## 2014-09-20 DIAGNOSIS — Z885 Allergy status to narcotic agent status: Secondary | ICD-10-CM | POA: Diagnosis not present

## 2014-09-20 DIAGNOSIS — M546 Pain in thoracic spine: Secondary | ICD-10-CM | POA: Insufficient documentation

## 2014-09-20 DIAGNOSIS — Z79899 Other long term (current) drug therapy: Secondary | ICD-10-CM | POA: Insufficient documentation

## 2014-09-20 DIAGNOSIS — D649 Anemia, unspecified: Secondary | ICD-10-CM | POA: Insufficient documentation

## 2014-09-20 DIAGNOSIS — R079 Chest pain, unspecified: Secondary | ICD-10-CM | POA: Insufficient documentation

## 2014-09-20 DIAGNOSIS — F3181 Bipolar II disorder: Secondary | ICD-10-CM | POA: Diagnosis present

## 2014-09-20 DIAGNOSIS — E1142 Type 2 diabetes mellitus with diabetic polyneuropathy: Secondary | ICD-10-CM | POA: Diagnosis not present

## 2014-09-20 DIAGNOSIS — Z8709 Personal history of other diseases of the respiratory system: Secondary | ICD-10-CM | POA: Insufficient documentation

## 2014-09-20 DIAGNOSIS — R519 Headache, unspecified: Secondary | ICD-10-CM

## 2014-09-20 DIAGNOSIS — Z8639 Personal history of other endocrine, nutritional and metabolic disease: Secondary | ICD-10-CM | POA: Insufficient documentation

## 2014-09-20 DIAGNOSIS — N134 Hydroureter: Secondary | ICD-10-CM | POA: Insufficient documentation

## 2014-09-20 DIAGNOSIS — F419 Anxiety disorder, unspecified: Secondary | ICD-10-CM | POA: Insufficient documentation

## 2014-09-20 DIAGNOSIS — E119 Type 2 diabetes mellitus without complications: Secondary | ICD-10-CM | POA: Insufficient documentation

## 2014-09-20 DIAGNOSIS — K219 Gastro-esophageal reflux disease without esophagitis: Secondary | ICD-10-CM | POA: Diagnosis not present

## 2014-09-20 DIAGNOSIS — G8929 Other chronic pain: Secondary | ICD-10-CM | POA: Insufficient documentation

## 2014-09-20 LAB — COMPREHENSIVE METABOLIC PANEL
ALBUMIN: 3.8 g/dL (ref 3.5–5.2)
ALT: 34 U/L (ref 0–35)
ANION GAP: 13 (ref 5–15)
AST: 34 U/L (ref 0–37)
Alkaline Phosphatase: 77 U/L (ref 39–117)
BUN: 7 mg/dL (ref 6–23)
CO2: 26 mEq/L (ref 19–32)
CREATININE: 0.58 mg/dL (ref 0.50–1.10)
Calcium: 9.9 mg/dL (ref 8.4–10.5)
Chloride: 100 mEq/L (ref 96–112)
GFR calc non Af Amer: >90 mL/min (ref >90)
Glucose, Bld: 177 mg/dL — ABNORMAL HIGH (ref 70–99)
Potassium: 3.3 mEq/L — ABNORMAL LOW (ref 3.7–5.3)
Sodium: 139 mEq/L (ref 137–147)
TOTAL PROTEIN: 7.5 g/dL (ref 6.0–8.3)
Total Bilirubin: 0.4 mg/dL (ref 0.3–1.2)

## 2014-09-20 LAB — URINE MICROSCOPIC-ADD ON

## 2014-09-20 LAB — RAPID URINE DRUG SCREEN, HOSP PERFORMED
Amphetamines: NOT DETECTED
Barbiturates: NOT DETECTED
Benzodiazepines: NOT DETECTED
Cocaine: NOT DETECTED
OPIATES: NOT DETECTED
Tetrahydrocannabinol: POSITIVE — AB

## 2014-09-20 LAB — CBC
HCT: 38 % (ref 36.0–46.0)
Hemoglobin: 12.7 g/dL (ref 12.0–15.0)
MCH: 27 pg (ref 26.0–34.0)
MCHC: 33.4 g/dL (ref 30.0–36.0)
MCV: 80.9 fL (ref 78.0–100.0)
PLATELETS: 277 10*3/uL (ref 150–400)
RBC: 4.7 MIL/uL (ref 3.87–5.11)
RDW: 14 % (ref 11.5–15.5)
WBC: 7.4 10*3/uL (ref 4.0–10.5)

## 2014-09-20 LAB — I-STAT TROPONIN, ED: Troponin i, poc: 0 ng/mL (ref 0.00–0.08)

## 2014-09-20 LAB — URINALYSIS, ROUTINE W REFLEX MICROSCOPIC
Ketones, ur: 40 mg/dL — AB
LEUKOCYTES UA: NEGATIVE
Nitrite: NEGATIVE
Protein, ur: 30 mg/dL — AB
SPECIFIC GRAVITY, URINE: 1.031 — AB (ref 1.005–1.030)
Urobilinogen, UA: 0.2 mg/dL (ref 0.0–1.0)
pH: 5.5 (ref 5.0–8.0)

## 2014-09-20 LAB — ACETAMINOPHEN LEVEL: Acetaminophen (Tylenol), Serum: <15 ug/mL (ref 10–30)

## 2014-09-20 LAB — SALICYLATE LEVEL

## 2014-09-20 LAB — I-STAT CREATININE, ED: Creatinine, Ser: 0.7 mg/dL (ref 0.50–1.10)

## 2014-09-20 LAB — ETHANOL

## 2014-09-20 LAB — GLUCOSE, CAPILLARY: Glucose-Capillary: 280 mg/dL — ABNORMAL HIGH (ref 70–99)

## 2014-09-20 MED ORDER — LISINOPRIL 20 MG PO TABS
20.0000 mg | ORAL_TABLET | Freq: Every day | ORAL | Status: DC
  Administered 2014-09-21: 20 mg via ORAL
  Filled 2014-09-20 (×3): qty 1

## 2014-09-20 MED ORDER — POTASSIUM CHLORIDE CRYS ER 20 MEQ PO TBCR
20.0000 meq | EXTENDED_RELEASE_TABLET | Freq: Two times a day (BID) | ORAL | Status: DC
  Administered 2014-09-20 – 2014-09-21 (×2): 20 meq via ORAL
  Filled 2014-09-20 (×5): qty 1

## 2014-09-20 MED ORDER — PRAVASTATIN SODIUM 20 MG PO TABS
20.0000 mg | ORAL_TABLET | Freq: Every day | ORAL | Status: DC
  Filled 2014-09-20: qty 1

## 2014-09-20 MED ORDER — HYDROXYZINE HCL 50 MG PO TABS
50.0000 mg | ORAL_TABLET | Freq: Three times a day (TID) | ORAL | Status: DC | PRN
Start: 2014-09-20 — End: 2014-09-21

## 2014-09-20 MED ORDER — INFLUENZA VAC SPLIT QUAD 0.5 ML IM SUSY
0.5000 mL | PREFILLED_SYRINGE | INTRAMUSCULAR | Status: AC
  Administered 2014-09-21: 0.5 mL via INTRAMUSCULAR
  Filled 2014-09-20: qty 0.5

## 2014-09-20 MED ORDER — FLUOXETINE HCL 20 MG PO CAPS
60.0000 mg | ORAL_CAPSULE | Freq: Every day | ORAL | Status: DC
  Administered 2014-09-21: 60 mg via ORAL
  Filled 2014-09-20 (×3): qty 3

## 2014-09-20 MED ORDER — DIPHENHYDRAMINE HCL 50 MG/ML IJ SOLN
25.0000 mg | Freq: Once | INTRAMUSCULAR | Status: AC
  Administered 2014-09-20: 25 mg via INTRAVENOUS
  Filled 2014-09-20: qty 1

## 2014-09-20 MED ORDER — CLONIDINE HCL 0.1 MG PO TABS
0.1000 mg | ORAL_TABLET | Freq: Once | ORAL | Status: AC
  Administered 2014-09-20: 0.1 mg via ORAL
  Filled 2014-09-20: qty 1

## 2014-09-20 MED ORDER — INSULIN ASPART PROT & ASPART (70-30 MIX) 100 UNIT/ML ~~LOC~~ SUSP
65.0000 [IU] | Freq: Two times a day (BID) | SUBCUTANEOUS | Status: DC
  Administered 2014-09-21: 65 [IU] via SUBCUTANEOUS

## 2014-09-20 MED ORDER — NITROGLYCERIN 2 % TD OINT
1.0000 [in_us] | TOPICAL_OINTMENT | Freq: Once | TRANSDERMAL | Status: AC
  Administered 2014-09-20: 1 [in_us] via TOPICAL
  Filled 2014-09-20: qty 1

## 2014-09-20 MED ORDER — SODIUM CHLORIDE 0.9 % IV BOLUS (SEPSIS)
1000.0000 mL | Freq: Once | INTRAVENOUS | Status: AC
Start: 2014-09-20 — End: 2014-09-20
  Administered 2014-09-20: 1000 mL via INTRAVENOUS

## 2014-09-20 MED ORDER — FLUTICASONE PROPIONATE HFA 44 MCG/ACT IN AERO: 2.0000 | INHALATION_SPRAY | Freq: Two times a day (BID) | RESPIRATORY_TRACT | Status: DC | PRN

## 2014-09-20 MED ORDER — IOHEXOL 350 MG/ML SOLN
100.0000 mL | Freq: Once | INTRAVENOUS | Status: AC | PRN
  Administered 2014-09-20: 100 mL via INTRAVENOUS

## 2014-09-20 MED ORDER — MAGNESIUM HYDROXIDE 400 MG/5ML PO SUSP: 30.0000 mL | Freq: Every day | ORAL | Status: DC | PRN

## 2014-09-20 MED ORDER — ALUM & MAG HYDROXIDE-SIMETH 200-200-20 MG/5ML PO SUSP
30.0000 mL | ORAL | Status: DC | PRN
  Administered 2014-09-21: 30 mL via ORAL
  Filled 2014-09-20: qty 30

## 2014-09-20 MED ORDER — TRAZODONE HCL 50 MG PO TABS
50.0000 mg | ORAL_TABLET | Freq: Every evening | ORAL | Status: DC | PRN
  Administered 2014-09-20 (×2): 50 mg via ORAL
  Filled 2014-09-20 (×6): qty 1

## 2014-09-20 MED ORDER — METOCLOPRAMIDE HCL 5 MG/ML IJ SOLN
10.0000 mg | Freq: Once | INTRAMUSCULAR | Status: AC
  Administered 2014-09-20: 10 mg via INTRAVENOUS
  Filled 2014-09-20: qty 2

## 2014-09-20 MED ORDER — LOPERAMIDE HCL 2 MG PO CAPS
2.0000 mg | ORAL_CAPSULE | Freq: Once | ORAL | Status: AC
  Administered 2014-09-20: 2 mg via ORAL
  Filled 2014-09-20: qty 1

## 2014-09-20 MED ORDER — GABAPENTIN 300 MG PO CAPS
300.0000 mg | ORAL_CAPSULE | Freq: Two times a day (BID) | ORAL | Status: DC
  Administered 2014-09-20 – 2014-09-21 (×2): 300 mg via ORAL
  Filled 2014-09-20 (×6): qty 1

## 2014-09-20 MED ORDER — MOMETASONE FURO-FORMOTEROL FUM 200-5 MCG/ACT IN AERO
2.0000 | INHALATION_SPRAY | Freq: Two times a day (BID) | RESPIRATORY_TRACT | Status: DC
  Administered 2014-09-20 – 2014-09-21 (×2): 2 via RESPIRATORY_TRACT
  Filled 2014-09-20 (×2): qty 8.8

## 2014-09-20 MED ORDER — PANTOPRAZOLE SODIUM 40 MG PO TBEC
40.0000 mg | DELAYED_RELEASE_TABLET | Freq: Every day | ORAL | Status: DC
  Administered 2014-09-21: 40 mg via ORAL
  Filled 2014-09-20 (×3): qty 1

## 2014-09-20 MED ORDER — METFORMIN HCL ER 750 MG PO TB24
2000.0000 mg | ORAL_TABLET | Freq: Every day | ORAL | Status: DC
  Filled 2014-09-20 (×3): qty 1
  Filled 2014-09-20: qty 2

## 2014-09-20 MED ORDER — INSULIN ASPART 100 UNIT/ML ~~LOC~~ SOLN
0.0000 [IU] | Freq: Every day | SUBCUTANEOUS | Status: DC
  Administered 2014-09-20: 3 [IU] via SUBCUTANEOUS

## 2014-09-20 NOTE — BH Assessment (Addendum)
Tele Assessment Note   Vanessa Case is an [age] y.o. female that report with passive suicidial ideation witout a plan. Patient reports that she  and denies SI/HI/Psychosis.  Patient reports stress and an inability to concentrate.  Patient reports increased depressed due to her husband cheating on her.    Patient reports compliant with her medication but feels as if the medicine is not working due to increased nightmares.  She states she was last admitted to psychiatric hospital in 1997 when she was having addiction problems. She states [facility] anxiety, depression, bipolar disease. Patient denies physical, sexual or emotional abuse.   Axis I: Anxiety Disorder NOS and Major Depression, Recurrent severe Axis II: Deferred Axis III:  Past Medical History  Diagnosis Date  . Anemia   . GERD (gastroesophageal reflux disease)   . Depression   . Anxiety   . 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 Hypertension     maintained on lisinopril hct, metoprolol-134/86 at pat visit  . Neuropathy   . Arthritis   . Hyperlipidemia   . Neuromuscular disorder   . Substance abuse   . Chronic headache   . Fibromyalgia   . Obesity    Axis IV: economic problems, occupational problems, other psychosocial or environmental problems, problems related to social environment, problems with access to health care services and problems with primary support group Axis V: 31-40 impairment in reality testing  Past Medical History:  Past Medical History  Diagnosis Date  . Anemia   . GERD (gastroesophageal reflux disease)   . Depression   . Anxiety   . 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 Hypertension     maintained on lisinopril hct, metoprolol-134/86 at pat visit  . Neuropathy   . Arthritis   . Hyperlipidemia   . Neuromuscular disorder    . Substance abuse   . Chronic headache   . Fibromyalgia   . Obesity     Past Surgical History  Procedure Laterality Date  . Cesarean section  x3  . Abdominal hysterectomy  10/30/2011    Procedure: HYSTERECTOMY ABDOMINAL;  Surgeon: Bing Plumehomas F Henley, MD;  Location: [facility];  Service: Gynecology;  Laterality: N/A;  . Salpingoophorectomy  10/30/2011    Procedure: SALPINGO OOPHERECTOMY;  Surgeon: Bing Plumehomas F Henley, MD;  Location: [facility];  Service: Gynecology;  Laterality: Right;  . Cholecystectomy N/A 06/28/2013    Procedure: LAPAROSCOPIC CHOLECYSTECTOMY;  Surgeon: Shelly Rubensteinouglas A Blackman, MD;  Location: [facility];  Service: General;  Laterality: N/A;  . Wrist surgery      carpel tunnel and tendonitis    Family History:  Family History  Problem Relation Age of Onset  . Cystic fibrosis Father   . Diabetes Mother   . Diabetes Maternal Grandmother   . Heart disease Maternal Grandfather     Social History:  reports that she quit smoking about 16 years ago. She has never used smokeless tobacco. She reports that she does not drink alcohol or use illicit drugs.  Additional Social History:     CIWA: CIWA-Ar BP: 162/87 mmHg Pulse Rate: 79 COWS:    PATIENT STRENGTHS: (choose at least two) Average or above average intelligence Capable of independent living Communication skills  Allergies:  Allergies  Allergen Reactions  . Percocet [Oxycodone-Acetaminophen] Hives    Hives  Home Medications:  (Not in a hospital admission)  OB/GYN Status:  Patient's last menstrual period was 10/07/2011.  General Assessment Data Location of Assessment: [facility] Is this a Tele or Face-to-Face Assessment?: Tele Assessment Is this an Initial Assessment or a Re-assessment for this encounter?: Initial Assessment Living Arrangements: Spouse/significant other Can pt return to current living arrangement?: Yes Admission Status: Voluntary Is patient capable of signing voluntary admission?:  Yes Transfer from: Home Referral Source: Self/Family/Friend  Medical Screening Exam [facility] Walk-in ONLY) Medical Exam completed: Yes  [facility] Living Arrangements: Spouse/significant other Name of Psychiatrist: [facility] Dr. Jean RosenthalJackson  [facility] of [facility]rapist: [facility] Mrs[facility] Jean [facility]enthal[facility]  Education Status Is patient currently in school?: No Current Grade: NA Highest grade of school patient has completed: NA Name of school: NA Contact person: NA  Risk to self with the past 6 months Suicidal Ideation: Yes-Currently Present Suicidal Intent: Yes-Currently Present Is patient at risk for suicide?: No Suicidal Plan?: No Access to Means: No What has been your use of drugs/alcohol within the last 12 months?: None Reported Previous Attempts/Gestures: Yes How many times?: 1 Other Self Harm Risks: 1993 Triggers for Past Attempts:  (Depression) Intentional Self Injurious Behavior: None Family Suicide History: Yes Recent stressful life event(s): Job Loss, Financial Problems, Other (Comment) (Husband cheated on her.) Persecutory voices/beliefs?: No Depression: Yes Depression Symptoms: Despondent, Tearfulness, Fatigue, Loss of interest in usual pleasures, Feeling worthless/self pity Substance abuse history and/or treatment for substance abuse?: No Suicide prevention information given to non-admitted patients: Not applicable  Risk to Others within the past 6 months Homicidal Ideation: No Thoughts of Harm to Others: No Current Homicidal Intent: No Current Homicidal Plan: No Access to Homicidal Means: No Identified Victim: NA History of harm to others?: No Assessment of Violence: None Noted Violent Behavior Description: None Reported Does patient have access to weapons?: Yes (Comment) Criminal Charges Pending?: No Does patient have a court date: No  Psychosis Hallucinations: Auditory (Answering questiond when no one is speaking to her.) Delusions: None noted  Mental Status  Report Appear/Hygiene: Disheveled Eye Contact: Fair Motor Activity: Freedom of movement Speech: Logical/coherent Level of Consciousness: Alert Mood: Depressed, Anxious Affect: Anxious, Blunted, Depressed Anxiety Level: Minimal Thought Processes: Coherent, Relevant Judgement: Unimpaired Orientation: Person, Place, Time, Situation Obsessive Compulsive Thoughts/Behaviors: None  Cognitive Functioning Concentration: Decreased Memory: Recent Intact, Remote Intact IQ: Average Insight: Fair Impulse Control: Fair Appetite: Fair Weight Loss: 0 Weight Gain: 0 Sleep: Decreased Total Hours of Sleep: 2 (Insomnia - takes ambien ) Vegetative Symptoms: Decreased grooming  ADLScreening [facility]) Patient's cognitive ability adequate to safely complete daily activities?: Yes Patient able to express need for assistance with ADLs?: Yes (Uses a walker and a can ) Independently performs ADLs?: No  Prior Inpatient Therapy Prior Inpatient Therapy: Yes Prior Therapy Dates: 1996 [facility] Reason for [facility]: Depressionm   Prior Outpatient Therapy Prior Outpatient Therapy: Yes Prior Therapy Dates: Ongoing  Prior Therapy Facilty/Provider(s): [facility]  Reason for Treatment: Depression  ADL Screening (condition at time of admission) Patient's cognitive ability adequate to safely complete daily activities?: Yes Patient able to express need for assistance with ADLs?: Yes (Uses a walker and a can ) Independently performs ADLs?: No             Advance Directives (For Healthcare) Does patient have an advance directive?: No Would patient like information on creating an advanced directive?: No - patient declined information    Additional Information 1:1 In Past 12 Months?:  No CIRT Risk: No Elopement Risk: No Does patient have medical clearance?: Yes     Disposition:  Per Renata Caprice, NP - patient meets criteria for OBS Bed 5.    Disposition Initial Assessment Completed for this Encounter: Yes Disposition of Patient: Other dispositions Other disposition(s): Other (Comment)  Vanessa Case 09/20/2014 10:20 AM

## 2014-09-20 NOTE — ED Notes (Signed)
Pt taken to the restroom by Warden FillersLiza and given a sample cup and informed of call light. Pt states "i will be in here awhile" pt informed to call if she needs assistance.

## 2014-09-20 NOTE — ED Provider Notes (Signed)
CSN: [id number]     Arrival date & time 09/20/14  0759 History   First MD Initiated Contact with Patient 09/20/14 [phone]     Chief Complaint  Patient presents with  . Headache  . Chest Pain     (Consider location/radiation/quality/duration/timing/severity/associated sxs/prior Treatment) HPI  Patient presents to the emergency department with several different complaints. Patient states she has chronic pain and "I live with pain all the time".  First she states she awakened at 6:30 this morning with chest heaviness and tightness in the lower portion of her central chest. She states she used her delay her. She checked her blood pressure and it was 177/120. She states the pain lasted 17 minutes and she had sweatiness and felt hot and cold. She states the heaviness is an 8 out of 10 and the pain is a 5 out of 10. She states her chest pain radiates into her back and then comes back up into her chest. She has some shortness of breath without wheezing. She denies having reflux symptoms with acid feeling in her throat. She states she's never had this pain before although she thinks it may be from her reflux. Patient has a family history of coronary artery disease, she states her maternal grandfather had congestive heart failure, a maternal uncle died of congestive heart failure, a maternal aunt is alive with some type of heart and kidney problems. She also states her maternal grandmother had heart problems.  Patient also complains of a left-sided throbbing headache with nausea without vomiting. States she's never had a headache before. She states there is a family history of migraines. She does report being under a lot of stress recently. She denies any visual changes. She denies any change in her chronic numbness of her extremities.  Patient also states she's had a cough with yellow sputum production for the past 6 weeks that she's been treating with Mucinex and Delsym.  Patient also complains of being very  depressed. She denies suicidal or homicidal ideation. She states she was last admitted to psychiatric hospital in 1997 when she was having addiction problems. She states she's followed at [facility] for anxiety, depression, bipolar disease. She states she's been taking her medication. She states she wants to speak to mental health counselor today.  [facility] CourierPowers [facility]  Past Medical History  Diagnosis Date  . Anemia   . GERD (gastroesophageal reflux disease)   . Depression   . Anxiety   . 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 Hypertension     maintained on lisinopril hct, metoprolol-134/86 at pat visit  . Neuropathy   . Arthritis   . Hyperlipidemia   . Neuromuscular disorder   . Substance abuse   . Chronic headache   . Fibromyalgia   . Obesity    Past Surgical History  Procedure Laterality Date  . Cesarean section  x3  . Abdominal hysterectomy  10/30/2011    Procedure: HYSTERECTOMY ABDOMINAL;  Surgeon: Bing Plumehomas F Henley, MD;  Location: [facility] ORS;  Service: Gynecology;  Laterality: N/A;  . Salpingoophorectomy  10/30/2011    Procedure: SALPINGO OOPHERECTOMY;  Surgeon: Bing Plumehomas F Henley, MD;  Location: [facility] ORS;  Service: Gynecology;  Laterality: Right;  . Cholecystectomy N/A 06/28/2013    Procedure: LAPAROSCOPIC CHOLECYSTECTOMY;  Surgeon: Shelly Rubensteinouglas A Blackman, MD;  Location: [facility] OR;  Service: General;  Laterality: N/A;  . Wrist surgery  carpel tunnel and tendonitis   Family History  Problem Relation Age of Onset  . Cystic fibrosis Father   . Diabetes Mother   . Diabetes Maternal Grandmother   . Heart disease Maternal Grandfather    History  Substance Use Topics  . Smoking status: Former Smoker    Quit date: 10/24/1997  . Smokeless tobacco: Never Used  . Alcohol Use: No   Applying for disability Uses a cane  OB History    Gravida Para Term Preterm AB TAB SAB Ectopic Multiple Living    3 3 3       3      Review of Systems  All other systems reviewed and are negative.     Allergies  Percocet  Home Medications   Prior to Admission medications   Medication Sig Start Date End Date Taking? Authorizing Provider  FLUoxetine (PROZAC) 20 MG capsule Take 60 mg by mouth daily.   Yes Historical Provider, MD  fluticasone (FLOVENT HFA) 44 MCG/ACT inhaler Inhale 2 puffs into the lungs 2 (two) times daily as needed (for shortness of breath).    Yes Historical Provider, MD  gabapentin (NEURONTIN) 300 MG capsule Take 300 mg by mouth 2 (two) times daily.    Yes Historical Provider, MD  hydrOXYzine (ATARAX/VISTARIL) 50 MG tablet Take 50 mg by mouth 3 (three) times daily as needed for anxiety or itching.    Yes Historical Provider, MD  insulin lispro protamine-lispro (HUMALOG 75/25 MIX) (75-25) 100 UNIT/ML SUSP injection Inject 65 Units into the skin 2 (two) times daily.   Yes Historical Provider, MD  lisinopril (PRINIVIL,ZESTRIL) 20 MG tablet Take 20 mg by mouth daily.   Yes Historical Provider, MD  lovastatin (MEVACOR) 20 MG tablet Take 20 mg by mouth at bedtime.   Yes Historical Provider, MD  metFORMIN (GLUCOPHAGE-XR) 500 MG 24 hr tablet Take 2,000 mg by mouth daily with breakfast.   Yes Historical Provider, MD  mometasone (NASONEX) 50 MCG/ACT nasal spray Place 2 sprays into the nose daily.   Yes Historical Provider, MD  mometasone-formoterol (DULERA) 200-5 MCG/ACT AERO Inhale 2 puffs into the lungs 2 (two) times daily as needed for wheezing.   Yes Historical Provider, MD  omeprazole (PRILOSEC) 20 MG capsule Take 20 mg by mouth daily.   Yes Historical Provider, MD  promethazine (PHENERGAN) 25 MG tablet Take 25 mg by mouth every 6 (six) hours as needed for nausea or vomiting.   Yes Historical Provider, MD  rOPINIRole (REQUIP) 3 MG tablet Take 3 mg by mouth at bedtime.   Yes Historical Provider, MD  zolpidem (AMBIEN) 10 MG tablet Take 10 mg by mouth at bedtime as needed for sleep.   Yes  Historical Provider, MD  gabapentin (NEURONTIN) 600 MG tablet Take 1 tablet (600 mg total) by mouth 3 (three) times daily. Patient not taking: Reported on 09/20/2014 07/16/14   Rolland PorterMark James, MD  HYDROcodone-acetaminophen (NORCO/VICODIN) 5-325 MG per tablet Take 1 tablet by mouth every 4 (four) hours as needed. Patient not taking: Reported on 09/20/2014 07/16/14   Rolland PorterMark James, MD   BP 162/87 mmHg  Pulse 79  Temp(Src) 98.1 F (36.7 C) (Oral)  Resp 12  Ht 5\' 5"  (1.651 m)  Wt 212 lb (96.163 kg)  BMI 35.28 kg/m2  SpO2 100%  LMP 10/07/2011  Vital signs normal   Physical Exam  Constitutional: She is oriented to person, place, and time. She appears well-developed and well-nourished.  Non-toxic appearance. She does not appear ill. No distress.  Blood pressure at the time of my exam was 162/87  HENT:  Head: Normocephalic and atraumatic.  Right Ear: External ear normal.  Left Ear: External ear normal.  Nose: Nose normal. No mucosal edema or rhinorrhea.  Mouth/Throat: Oropharynx is clear and moist and mucous membranes are normal. No dental abscesses or uvula swelling.  Eyes: Conjunctivae and EOM are normal. Pupils are equal, round, and reactive to light.  Neck: Normal range of motion and full passive range of motion without pain. Neck supple.  Cardiovascular: Normal rate, regular rhythm and normal heart sounds.  Exam reveals no gallop and no friction rub.   No murmur heard. Pulmonary/Chest: Effort normal and breath sounds normal. No respiratory distress. She has no wheezes. She has no rhonchi. She has no rales. She exhibits no tenderness and no crepitus.  Abdominal: Soft. Normal appearance and bowel sounds are normal. She exhibits no distension. There is no tenderness. There is no rebound and no guarding.  Musculoskeletal: Normal range of motion. She exhibits no edema or tenderness.  Moves all extremities well.   Neurological: She is alert and oriented to person, place, and time. She has normal  strength. No cranial nerve deficit.  Skin: Skin is warm, dry and intact. No rash noted. No erythema. No pallor.  Psychiatric: She has a normal mood and affect. Her speech is normal and behavior is normal. Her mood appears not anxious.  Nursing note and vitals reviewed.   ED Course  Procedures (including critical care time)  Medications  sodium chloride 0.9 % bolus 1,000 mL (1,000 mLs Intravenous New Bag/Given 09/20/14 0946)  metoCLOPramide (REGLAN) injection 10 mg (10 mg Intravenous Given 09/20/14 0946)  diphenhydrAMINE (BENADRYL) injection 25 mg (25 mg Intravenous Given 09/20/14 0946)  nitroGLYCERIN (NITROGLYN) 2 % ointment 1 inch (1 inch Topical Given 09/20/14 0946)  loperamide (IMODIUM) capsule 2 mg (2 mg Oral Given 09/20/14 0946)  iohexol (OMNIPAQUE) 350 MG/ML injection 100 mL (100 mLs Intravenous Contrast Given 09/20/14 1434)   09:50 Ava, TSS called to discuss reason for consult  10:50 Ava, TSS called, pt accepted to obs at [facility] by Claudette Head  Patient's pains are better after medicated. Patient is ready to be transferred to behavioral health   Labs Review Results for orders placed or performed during the hospital encounter of 09/20/14  CBC  Result Value Ref Range   WBC 7.4 4.0 - 10.5 K/uL   RBC 4.70 3.87 - 5.11 MIL/uL   Hemoglobin 12.7 12.0 - 15.0 g/dL   HCT 16.1 09.6 - 04.5 %   MCV 80.9 78.0 - 100.0 fL   MCH 27.0 26.0 - 34.0 pg   MCHC 33.4 30.0 - 36.0 g/dL   RDW 40.9 81.1 - 91.4 %   Platelets 277 150 - 400 K/uL  Comprehensive metabolic panel  Result Value Ref Range   Sodium 139 137 - 147 mEq/L   Potassium 3.3 (L) 3.7 - 5.3 mEq/L   Chloride 100 96 - 112 mEq/L   CO2 26 19 - 32 mEq/L   Glucose, Bld 177 (H) 70 - 99 mg/dL   BUN 7 6 - 23 mg/dL   Creatinine, Ser 7.82 0.50 - 1.10 mg/dL   Calcium 9.9 8.4 - 95.6 mg/dL   Total Protein 7.5 6.0 - 8.3 g/dL   Albumin 3.8 3.5 - 5.2 g/dL   AST 34 0 - 37 U/L   ALT 34 0 - 35 U/L   Alkaline Phosphatase 77 39 - 117 U/L   Total  Bilirubin 0.4  0.3 - 1.2 mg/dL   GFR calc non Af Amer >90 >90 mL/min   GFR calc Af Amer >90 >90 mL/min   Anion gap 13 5 - 15  Ethanol  Result Value Ref Range   Alcohol, Ethyl (B) <11 0 - 11 mg/dL  Acetaminophen level  Result Value Ref Range   Acetaminophen (Tylenol), Serum <15.0 10 - 30 ug/mL  Salicylate level  Result Value Ref Range   Salicylate Lvl <2.0 (L) 2.8 - 20.0 mg/dL  Urine rapid drug screen (hosp performed)  Result Value Ref Range   Opiates NONE DETECTED NONE DETECTED   Cocaine NONE DETECTED NONE DETECTED   Benzodiazepines NONE DETECTED NONE DETECTED   Amphetamines NONE DETECTED NONE DETECTED   Tetrahydrocannabinol POSITIVE (A) NONE DETECTED   Barbiturates NONE DETECTED NONE DETECTED  Urinalysis, Routine w reflex microscopic  Result Value Ref Range   Color, Urine AMBER (A) YELLOW   APPearance CLOUDY (A) CLEAR   Specific Gravity, Urine 1.031 (H) 1.005 - 1.030   pH 5.5 5.0 - 8.0   Glucose, UA >1000 (A) NEGATIVE mg/dL   Hgb urine dipstick TRACE (A) NEGATIVE   Bilirubin Urine SMALL (A) NEGATIVE   Ketones, ur 40 (A) NEGATIVE mg/dL   Protein, ur 30 (A) NEGATIVE mg/dL   Urobilinogen, UA 0.2 0.0 - 1.0 mg/dL   Nitrite NEGATIVE NEGATIVE   Leukocytes, UA NEGATIVE NEGATIVE  Urine microscopic-add on  Result Value Ref Range   Squamous Epithelial / LPF MANY (A) RARE   WBC, UA 0-2 <3 WBC/hpf   RBC / HPF 0-2 <3 RBC/hpf   Bacteria, UA FEW (A) RARE   Urine-Other MANY YEAST   I-stat troponin, ED (not at [facility])  Result Value Ref Range   Troponin i, poc 0.00 0.00 - 0.08 ng/mL   Comment 3          I-stat Creatinine, ED  Result Value Ref Range   Creatinine, Ser 0.70 0.50 - 1.10 mg/dL    Laboratory interpretation all normal except glucosuria and mild hyperglycemia   Imaging Review Dg Chest 2 View  09/20/2014   CLINICAL DATA:  Chest pain, shortness of Breath, weakness  EXAM: CHEST  2 VIEW  COMPARISON:  07/12/2013  FINDINGS: The heart size and mediastinal contours are within  normal limits. Both lungs are clear. The visualized skeletal structures are unremarkable.  IMPRESSION: No active cardiopulmonary disease.   Electronically Signed   By: Natasha Mead M.D.   On: 09/20/2014 10:55   Ct Head Wo Contrast  09/20/2014   CLINICAL DATA:  Left side face numbness  EXAM: CT HEAD WITHOUT CONTRAST  TECHNIQUE: Contiguous axial images were obtained from the base of the skull through the vertex without intravenous contrast.  COMPARISON:  03/05/2013  FINDINGS: No skull fracture is noted. Paranasal sinuses and mastoid air cells are unremarkable.  No intracranial hemorrhage, mass effect or midline shift. No hydrocephalus. No mass lesion is noted on this unenhanced scan. No acute cortical infarction. The gray and white-matter differentiation is preserved.  IMPRESSION: No acute intracranial abnormality.   Electronically Signed   By: Natasha Mead M.D.   On: 09/20/2014 15:04   Ct Angio Chest Aorta W/cm &/or Wo/cm   Ct Angio Abd/pel W/ And/or W/o  09/20/2014   CLINICAL DATA:  Elevated blood pressure, left side face numbness, mid chest pain  EXAM: CT ANGIOGRAPHY CHEST, ABDOMEN AND PELVIS  TECHNIQUE: Multidetector CT imaging through the chest, abdomen and pelvis was performed using the standard protocol during bolus administration  of intravenous contrast. Multiplanar reconstructed images and MIPs were obtained and reviewed to evaluate the vascular anatomy.  CONTRAST:  OMNIPAQUE IOHEXOL 350 MG/ML SOLN  COMPARISON:  06/27/2013  FINDINGS: CTA CHEST FINDINGS  Unenhanced images of the chest shows no aortic calcifications. No coronary artery calcifications are noted.  Thoracic aorta shows no evidence of aortic dissection. No aortic aneurysm.  Ascending aorta measures 2.7 cm in diameter. Descending aorta measures 2.6 cm in diameter.  Central pulmonary artery is unremarkable.  Central airways are patent. No mediastinal hematoma or adenopathy. Heart size within normal limits. No pericardial effusion.  Images  of the lung parenchyma shows no acute infiltrate or pulmonary edema. No focal consolidation. No pneumothorax. No rib fractures are noted. There are streaky artifacts from patient's large body habitus.  Review of the MIP images confirms the above findings.  CTA ABDOMEN AND PELVIS FINDINGS  Fatty infiltration of the liver is noted. The patient is status post cholecystectomy. Abdominal aorta is unremarkable. No evidence of aneurysm or dissection. Bilateral renal artery is patent. SMA and celiac trunk appears patent. Splenic artery and hepatic artery are patent. Kidneys are symmetrical in size and enhancement. No hydronephrosis or hydroureter.  Sagittal images of the spine shows no acute fractures within chest abdomen or pelvis. Again SMA and celiac trunk are patent without evidence of stenosis on sagittal images.  No small bowel obstruction. There is no pericecal inflammation. Normal appendix. The terminal ileum is unremarkable. There is lower pelvic wall anterior scarring. Small nonspecific bilateral inguinal lymph nodes are noted. The patient is status post hysterectomy. There is a left ovarian cyst/follicle measures 2.5 cm. No pelvic ascites or adenopathy.  Review of the MIP images confirms the above findings.  IMPRESSION: 1. There is no evidence of thoracic aortic aneurysm or aortic dissection. 2. No mediastinal hematoma or adenopathy. 3. No acute infiltrate or pulmonary edema. 4. No abdominal aortic aneurysm or dissection. Patent celiac trunk and SMA, patent bilateral renal artery, splenic artery and hepatic artery. 5. Normal appendix.  No pericecal inflammation. 6. No small bowel obstruction. 7. No hydronephrosis or hydroureter. 8. Surgically absent uterus. Status postcholecystectomy. There is a cyst/follicle within left ovary measures 2.5 cm.   Electronically Signed   By: Natasha Mead M.D.   On: 09/20/2014 15:15     EKG Interpretation   Date/Time:  Tuesday September 20 2014 08:02:25 EST Ventricular Rate:   90 PR Interval:  134 QRS Duration: 78 QT Interval:  362 QTC Calculation: 442 R Axis:   51 Text Interpretation:  Normal sinus rhythm Nonspecific T wave abnormality  No significant change since last tracing 13 Apr 2014 Confirmed by Aamilah Augenstein   MD-I, Jamauri Kruzel ([id number]) on 09/20/2014 9:06:21 AM      MDM   Final diagnoses:  Headache  Hypertension  Chest pain, unspecified chest pain type  Midline thoracic back pain  Depression (emotion)    Plan transfer to [facility] to observation unit   Devoria Albe, MD, Armando Gang     Ward Givens, MD 09/20/14 [phone]

## 2014-09-20 NOTE — ED Notes (Signed)
Patient transported to CT 

## 2014-09-20 NOTE — Progress Notes (Signed)
Pt alert, oriented and cooperative. Affect/mood depressed. Denies SI/HI @ present to Clinical research associatewriter. Verbally contracts for safety. -A/[place]@ present but admits to hearing voices sometimes. "they just say things like what are you doing today" C/o chronic back pain see MAR.  Emotional support and encouragement given. Will continue to monitor closely and evaluate for stabilization.

## 2014-09-20 NOTE — ED Notes (Signed)
Pt states she lost her cane. Checked with radiology at CT, not found. Pt family found cane under bed.

## 2014-09-20 NOTE — ED Notes (Signed)
EDP at bedside  

## 2014-09-20 NOTE — ED Notes (Signed)
TTS monitor at bedside 

## 2014-09-20 NOTE — ED Notes (Signed)
Per EMS, patient called EMS for L sided headache.  EMS advised that patient 12 lead normal.   Patient was hypertensive at 172/120.   20 G L AC placed by EMS.   Patient states she had mid chest pain with no radiation this morning that has resolved.   Patient denied SOB or chest pain at this time.

## 2014-09-20 NOTE — [facility] Counselor (Signed)
Per Renata Capriceonrad, NP - patient meets criteria for OBS.  Patient has been accepted to OBS Bed 5.  Writer informed Dr. Lynelle DoctorKnapp that the patient has been accepted to the OBS dept.  The nurse will arrange transportation with [place].

## 2014-09-20 NOTE — Plan of Care (Signed)
[facility] Observation Crisis Plan  Reason for Crisis Plan:  Crisis Stabilization   Plan of Care:  Referral for Telepsychiatry/Psychiatric Consult  Family Support:      Current Living Environment:  Living Arrangements: Spouse/significant other  Insurance:   Hospital Account    Name Acct ID Class Status Primary Coverage   Vanessa Case, Vanessa Case [id number] BEHAVIORAL HEALTH OBSERVATION Open COVENTRY Tedd Sias- COVENTRY [facility]        Guarantor Account (for Hospital Account [id number])    Name Relation to Pt Service Area Active? Acct Type   Vanessa Case, Vanessa Case Self [facility] Yes [facility]   Address Phone       [address] [place], [place] [address] [phone])          Coverage Information (for Hospital Account [id number])    1. [facility]OVENTRY[facility]    F/O Payor/Plan Precert #   COVENTRY[facility]OVENTRY [facility]    Subscriber Subscriber #   Vanessa Case, Vanessa Case [id number]   Address Phone   [address] [place], [place] [address] [phone]       2. [facility] [address]    F/O Payor/Plan Precert #   [facility] [address]    Subscriber Subscriber #   Vanessa Case, Vanessa Case    Address Phone   [address] [place], [place] [address] [phone]          Legal Guardian:     Primary Care Provider:  Ihor GullyPowers, Stephanie J  Current [facility] Prov[facility]  [facility]  Psychiatrist:     Counselor/Therapist:     Compliant with Medications:  Yes  Additional Information:   Loren RacerMaggio, Ryenne Lynam J 12/1/20156:10 PM

## 2014-09-20 NOTE — ED Notes (Signed)
When this nurse [facility] to pt [facility], pt was tearful-- stating "I really need help. My husband is controlling, he hasn't got my insulin for 6 weeks. He has had affairs, I am so depressed." pt admits [facility]. States her 41 y/o son will take care of her 41 y/o daughter [facility]. States she sleeps with her daughter, because of marital problems.

## 2014-09-20 NOTE — ED Notes (Signed)
Urine collected if needed.

## 2014-09-20 NOTE — Progress Notes (Signed)
Patient ID: Vanessa PellantSherri Silena Deardorff, female   DOB: April 18, 1971, 43 y.o.   MRN: [id number] Admission Note-[facility] to [facility] with fleeting thoughts of suicide without a specific plan and endorses talking to self and responding. She has been crying for three days and has a long history of depression. She walks with a cane and allowed to keep it in OBS. She uses the cane for her neuropathy.in her left foot. She has a history of chronic diarrhea and HTN. She found out recently that her husband has been unfaithful to her and that is the precipitating factor in her increased sx of depression. Property inventoried. Given dinner on arrival. Pleasant and cooperative with the admission process. She is able to contract for safety while here.

## 2014-09-20 NOTE — ED Notes (Signed)
Pt has been unable to provide stool specimen, Dr. Lynelle DoctorKnapp is aware.

## 2014-09-20 NOTE — ED Notes (Signed)
Pt departed with [place]. Belongings going with pt family.

## 2014-09-20 NOTE — BH Assessment (Signed)
Writer informed Dr. Lynelle DoctorKnapp that the consult has been received and the TTS Therapist  (Illyana Schorsch) will assess the patient at 10:00am

## 2014-09-20 NOTE — ED Notes (Signed)
TSS machine set up in room for eval

## 2014-09-20 NOTE — Progress Notes (Signed)
[facility] INPATIENT:  Family/Significant Other Suicide Prevention Education  Suicide Prevention Education:  Patient Refusal for Family/Significant Other Suicide Prevention Education: The patient Vanessa Case has refused to provide written consent for family/significant other to be provided Family/Significant Other Suicide Prevention Education during admission and/or prior to discharge.  Physician notified.  Loren RacerMaggio, Brett Soza J 09/20/2014, 6:42 PM

## 2014-09-20 NOTE — ED Notes (Signed)
Troponin results given to Dr. Charline BillsI knapp

## 2014-09-20 NOTE — ED Notes (Signed)
Spoke with Elease HashimotoPatricia (phlebotomy) advised LAb still waiting on LABS to be sent.  She stated she would call and see.

## 2014-09-21 DIAGNOSIS — F419 Anxiety disorder, unspecified: Secondary | ICD-10-CM

## 2014-09-21 DIAGNOSIS — F332 Major depressive disorder, recurrent severe without psychotic features: Secondary | ICD-10-CM | POA: Diagnosis not present

## 2014-09-21 LAB — GLUCOSE, CAPILLARY
Glucose-Capillary: 258 mg/dL — ABNORMAL HIGH (ref 70–99)
Glucose-Capillary: 129 mg/dL — ABNORMAL HIGH (ref 70–99)

## 2014-09-21 MED ORDER — INSULIN LISPRO PROT & LISPRO (75-25 MIX) 100 UNIT/ML ~~LOC~~ SUSP: 65.0000 [IU] | Freq: Two times a day (BID) | SUBCUTANEOUS | Status: DC

## 2014-09-21 MED ORDER — ONDANSETRON HCL 4 MG PO TABS
4.0000 mg | ORAL_TABLET | Freq: Three times a day (TID) | ORAL | Status: DC | PRN
Start: 2014-09-21 — End: 2014-09-21

## 2014-09-21 MED ORDER — LOVASTATIN 20 MG PO TABS: 20.0000 mg | ORAL_TABLET | Freq: Every day | ORAL | Status: DC

## 2014-09-21 MED ORDER — TRAZODONE HCL 50 MG PO TABS: 50.0000 mg | ORAL_TABLET | Freq: Every evening | ORAL | Status: DC | PRN

## 2014-09-21 MED ORDER — METFORMIN HCL ER 500 MG PO TB24: 2000.0000 mg | ORAL_TABLET | Freq: Every day | ORAL | Status: DC

## 2014-09-21 MED ORDER — GABAPENTIN 300 MG PO CAPS: 300.0000 mg | ORAL_CAPSULE | Freq: Two times a day (BID) | ORAL | Status: DC

## 2014-09-21 MED ORDER — FLUOXETINE HCL 20 MG PO CAPS: 60.0000 mg | ORAL_CAPSULE | Freq: Every day | ORAL | Status: DC

## 2014-09-21 MED ORDER — LISINOPRIL 20 MG PO TABS: 20.0000 mg | ORAL_TABLET | Freq: Every day | ORAL | Status: DC

## 2014-09-21 MED ORDER — HYDROXYZINE HCL 50 MG PO TABS: 50.0000 mg | ORAL_TABLET | Freq: Three times a day (TID) | ORAL | Status: DC | PRN

## 2014-09-21 MED ORDER — OMEPRAZOLE 20 MG PO CPDR: 20.0000 mg | DELAYED_RELEASE_CAPSULE | Freq: Every day | ORAL | Status: DC

## 2014-09-21 MED ORDER — ONDANSETRON HCL 4 MG PO TABS: 4.0000 mg | ORAL_TABLET | Freq: Three times a day (TID) | ORAL | Status: DC | PRN

## 2014-09-21 NOTE — Discharge Summary (Signed)
[facility] OBS UNIT DISCHARGE SUMMARY  Subjective: Pt seen and chart reviewed. Pt reports that her thoughts of harming herself in the ED yesterday were situational and that she "just needed to step away from the situation to clear my mind". Pt now denies SI, HI, and AVH and is able to contract for safety. Pt reports that her husband's drug use is tearing her family apart and that she is trying to support him which is the cause of her despair. Pt reports that she has too much to live for with her 41 year old and 41 year old son and that she would never take her life. Pt is appropriate and cooperative and has shown no signs of objective nor subjective self-injurious behaviors or intentions while in ED and [facility]. Pt has a good family support system and would like [facility], to be assisted with such by [facility].   HPI:  Vanessa Case is an 43 y.o. female that report with passive suicidial ideation witout a plan. Patient reports that she  and denies SI/HI/Psychosis.  Patient reports stress and an inability to concentrate.  Patient reports increased depressed due to her husband cheating on her.    Patient reports compliant with her medication but feels as if the medicine is not working due to increased nightmares.  She states she was last admitted to psychiatric hospital in 1997 when she was having addiction problems. She states [facility] for anxiety, depression, bipolar disease. Patient denies physical, sexual or emotional abuse.   Axis I: Anxiety Disorder NOS and Major Depression, Recurrent severe Axis II: Deferred Axis III:  Past Medical History  Diagnosis Date  . Anemia   . GERD (gastroesophageal reflux disease)   . Depression   . Anxiety   . 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 Hypertension     maintained on lisinopril hct, metoprolol-134/86 at pat visit  . Neuropathy    . Arthritis   . Hyperlipidemia   . Neuromuscular disorder   . Substance abuse   . Chronic headache   . Fibromyalgia   . Obesity    Axis IV: economic problems, occupational problems, other psychosocial or environmental problems, problems related to social environment, problems with access to health care services and problems with primary support group Axis V: Moderate symptoms  Psychiatric Specialty Exam: Physical Exam  ROS  Blood pressure 152/98, pulse 86, temperature 97.9 F (36.6 C), temperature source Oral, resp. rate 18, height 5\' 5"  (1.651 m), weight 0 kg (0 lb), last menstrual period 10/07/2011, SpO2 100 %.Body mass index is 0.00 kg/(m^2).  General Appearance: Casual and Fairly Groomed  Patent attorneyye Contact::  Good  Speech:  Clear and Coherent and Normal Rate  Volume:  Normal  Mood:  Euthymic  Affect:  Appropriate and Congruent  Thought Process:  Coherent and Goal Directed  Orientation:  Full (Time, Place, and Person)  Thought Content:  WDL  Suicidal Thoughts:  No  Homicidal Thoughts:  No  Memory:  Immediate;   Fair Recent;   Fair Remote;   Fair  Judgement:  Fair  Insight:  Fair  Psychomotor Activity:  Normal  Concentration:  Good  Recall:  Good  Akathisia:  No  Handed:    AIMS (if indicated):     Assets:  Communication Skills Desire for Improvement Housing Physical Health Resilience Social Support  Sleep:  Past Medical History:  Past Medical History  Diagnosis Date  . Anemia   . GERD (gastroesophageal reflux disease)   . Depression   . Anxiety   . 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 Hypertension     maintained on lisinopril hct, metoprolol-134/86 at pat visit  . Neuropathy   . Arthritis   . Hyperlipidemia   . Neuromuscular disorder   . Substance abuse   . Chronic headache   . Fibromyalgia   . Obesity     Past Surgical History  Procedure Laterality Date  .  Cesarean section  x3  . Abdominal hysterectomy  10/30/2011    Procedure: HYSTERECTOMY ABDOMINAL;  Surgeon: Bing Plumehomas F Henley, MD;  Location: [facility] ORS;  Service: Gynecology;  Laterality: N/A;  . Salpingoophorectomy  10/30/2011    Procedure: SALPINGO OOPHERECTOMY;  Surgeon: Bing Plumehomas F Henley, MD;  Location: [facility] ORS;  Service: Gynecology;  Laterality: Right;  . Cholecystectomy N/A 06/28/2013    Procedure: LAPAROSCOPIC CHOLECYSTECTOMY;  Surgeon: Shelly Rubensteinouglas A Blackman, MD;  Location: [facility] OR;  Service: General;  Laterality: N/A;  . Wrist surgery      carpel tunnel and tendonitis    Family History:  Family History  Problem Relation Age of Onset  . Cystic fibrosis Father   . Diabetes Mother   . Diabetes Maternal Grandmother   . Heart disease Maternal Grandfather     Social History:  reports that she quit smoking about 16 years ago. She has never used smokeless tobacco. She reports that she does not drink alcohol or use illicit drugs.  Additional Social History:     CIWA: CIWA-Ar BP: (!) 152/98 mmHg Pulse Rate: 86 COWS:    PATIENT STRENGTHS: (choose at least two) Average or above average intelligence Capable of independent living Communication skills  Allergies:  Allergies  Allergen Reactions  . Percocet [Oxycodone-Acetaminophen] Hives    Hives     Home Medications:  Medications Prior to Admission  Medication Sig Dispense Refill  . FLUoxetine (PROZAC) 20 MG capsule Take 60 mg by mouth daily.    . fluticasone (FLOVENT HFA) 44 MCG/ACT inhaler Inhale 2 puffs into the lungs 2 (two) times daily as needed (for shortness of breath).     . gabapentin (NEURONTIN) 300 MG capsule Take 300 mg by mouth 2 (two) times daily.     . hydrOXYzine (ATARAX/VISTARIL) 50 MG tablet Take 50 mg by mouth 3 (three) times daily as needed for anxiety or itching.     . insulin lispro protamine-lispro (HUMALOG 75/25 MIX) (75-25) 100 UNIT/ML SUSP injection Inject 65 Units into the skin 2 (two) times daily.    Marland Kitchen. lisinopril  (PRINIVIL,ZESTRIL) 20 MG tablet Take 20 mg by mouth daily.    Marland Kitchen. lovastatin (MEVACOR) 20 MG tablet Take 20 mg by mouth at bedtime.    . metFORMIN (GLUCOPHAGE-XR) 500 MG 24 hr tablet Take 2,000 mg by mouth daily with breakfast.    . mometasone-formoterol (DULERA) 200-5 MCG/ACT AERO Inhale 2 puffs into the lungs 2 (two) times daily as needed for wheezing.    Marland Kitchen. omeprazole (PRILOSEC) 20 MG capsule Take 20 mg by mouth daily.       Plan:  -Discharge home with outpatient services. -[facility].      Beau FannyWithrow, Nour Rodrigues C, FNP-BC 09/21/2014 12:39 PM

## 2014-09-21 NOTE — Progress Notes (Signed)
D:Pt is pleasant and cooperative. Pt reports that she found out her husband was cheating two months ago. She reports that her stress level had became high and she came to the hospital to help with coping and stress. Pt reports that she feels better this morning with no hallucinations. A:Offered support, encouragement and 15 minute checks. R:Pt denies si and hi. Safety maintained in the OBS unit.

## 2014-09-21 NOTE — H&P (Signed)
[facility] OBS UNIT H&P  Subjective: Pt seen and chart reviewed. Pt reports that her thoughts of harming herself in the ED yesterday were situational and that she "just needed to step away from the situation to clear my mind". Pt now denies SI, HI, and AVH and is able to contract for safety. Pt reports that her husband's drug use is tearing her family apart and that she is trying to support him which is the cause of her despair. Pt reports that she has too much to live for with her 41 year old and 41 year old son and that she would never take her life. Pt is appropriate and cooperative and has shown no signs of objective nor subjective self-injurious behaviors or intentions while in ED and [facility] observation. Pt has a good family support system and would like [facility], to be [facility] with such [facility].   HPI:  Vanessa Case is an 43 y.o. female that report with passive suicidial ideation witout a plan. Patient reports that she  and denies SI/HI/Psychosis.  Patient reports stress and an inability to concentrate.  Patient reports increased depressed due to her husband cheating on her.    Patient reports compliant with her medication but feels as if the medicine is not working due to increased nightmares.  She states she was last admitted to psychiatric hospital in 1997 when she was having addiction problems. She states [facility] anxiety, depression, bipolar disease. Patient denies physical, sexual or emotional abuse.   Axis I: Anxiety Disorder NOS and Major Depression, Recurrent severe Axis II: Deferred Axis III:  Past Medical History  Diagnosis Date  . Anemia   . GERD (gastroesophageal reflux disease)   . Depression   . Anxiety   . 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 Hypertension     maintained on lisinopril hct, metoprolol-134/86 at pat visit  . Neuropathy   . Arthritis    . Hyperlipidemia   . Neuromuscular disorder   . Substance abuse   . Chronic headache   . Fibromyalgia   . Obesity    Axis IV: economic problems, occupational problems, other psychosocial or environmental problems, problems related to social environment, problems with access to health care services and problems with primary support group Axis V: Moderate symptoms  Psychiatric Specialty Exam: Physical Exam  ROS  Blood pressure 152/98, pulse 86, temperature 97.9 F (36.6 C), temperature source Oral, resp. rate 18, height 5\' 5"  (1.651 m), weight 0 kg (0 lb), last menstrual period 10/07/2011, SpO2 100 %.Body mass index is 0.00 kg/(m^2).  General Appearance: Casual and Fairly Groomed  Patent attorneyye Contact::  Good  Speech:  Clear and Coherent and Normal Rate  Volume:  Normal  Mood:  Euthymic  Affect:  Appropriate and Congruent  Thought Process:  Coherent and Goal Directed  Orientation:  Full (Time, Place, and Person)  Thought Content:  WDL  Suicidal Thoughts:  No  Homicidal Thoughts:  No  Memory:  Immediate;   Fair Recent;   Fair Remote;   Fair  Judgement:  Fair  Insight:  Fair  Psychomotor Activity:  Normal  Concentration:  Good  Recall:  Good  Akathisia:  No  Handed:    AIMS (if indicated):     Assets:  Communication Skills Desire for Improvement Housing Physical Health Resilience Social Support  Sleep:  Past Medical History:  Past Medical History  Diagnosis Date  . Anemia   . GERD (gastroesophageal reflux disease)   . Depression   . Anxiety   . 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 Hypertension     maintained on lisinopril hct, metoprolol-134/86 at pat visit  . Neuropathy   . Arthritis   . Hyperlipidemia   . Neuromuscular disorder   . Substance abuse   . Chronic headache   . Fibromyalgia   . Obesity     Past Surgical History  Procedure Laterality Date  . Cesarean section  x3   . Abdominal hysterectomy  10/30/2011    Procedure: HYSTERECTOMY ABDOMINAL;  Surgeon: Bing Plumehomas F Henley, MD;  Location: [facility]S;  Service: Gynecology;  Laterality: N/A;  . Salpingoophorectomy  10/30/2011    Procedure: SALPINGO OOPHERECTOMY;  Surgeon: Bing Plumehomas F Henley, MD;  Location: [facility];  Service: Gynecology;  Laterality: Right;  . Cholecystectomy N/A 06/28/2013    Procedure: LAPAROSCOPIC CHOLECYSTECTOMY;  Surgeon: Shelly Rubensteinouglas A Blackman, MD;  Location: [facility] OR;  Service: General;  Laterality: N/A;  . Wrist surgery      carpel tunnel and tendonitis    Family History:  Family History  Problem Relation Age of Onset  . Cystic fibrosis Father   . Diabetes Mother   . Diabetes Maternal Grandmother   . Heart disease Maternal Grandfather     Social History:  reports that she quit smoking about 16 years ago. She has never used smokeless tobacco. She reports that she does not drink alcohol or use illicit drugs.  Additional Social History:     CIWA: CIWA-Ar BP: (!) 152/98 mmHg Pulse Rate: 86 COWS:    PATIENT STRENGTHS: (choose at least two) Average or above average intelligence Capable of independent living Communication skills  Allergies:  Allergies  Allergen Reactions  . Percocet [Oxycodone-Acetaminophen] Hives    Hives     Home Medications:  Medications Prior to Admission  Medication Sig Dispense Refill  . FLUoxetine (PROZAC) 20 MG capsule Take 60 mg by mouth daily.    . fluticasone (FLOVENT HFA) 44 MCG/ACT inhaler Inhale 2 puffs into the lungs 2 (two) times daily as needed (for shortness of breath).     . gabapentin (NEURONTIN) 300 MG capsule Take 300 mg by mouth 2 (two) times daily.     . hydrOXYzine (ATARAX/VISTARIL) 50 MG tablet Take 50 mg by mouth 3 (three) times daily as needed for anxiety or itching.     . insulin lispro protamine-lispro (HUMALOG 75/25 MIX) (75-25) 100 UNIT/ML SUSP injection Inject 65 Units into the skin 2 (two) times daily.    Marland Kitchen. lisinopril (PRINIVIL,ZESTRIL) 20  MG tablet Take 20 mg by mouth daily.    Marland Kitchen. lovastatin (MEVACOR) 20 MG tablet Take 20 mg by mouth at bedtime.    . metFORMIN (GLUCOPHAGE-XR) 500 MG 24 hr tablet Take 2,000 mg by mouth daily with breakfast.    . mometasone-formoterol (DULERA) 200-5 MCG/ACT AERO Inhale 2 puffs into the lungs 2 (two) times daily as needed for wheezing.    Marland Kitchen. omeprazole (PRILOSEC) 20 MG capsule Take 20 mg by mouth daily.       Plan:  -Discharge home with outpatient services. -[facility].      Beau FannyWithrow, John C, FNP-BC 09/21/2014 08:45AM

## 2014-09-21 NOTE — Progress Notes (Signed)
Pt d/c with a friend. All items returned. D/C instructions given and prescriptions given. Pt denies si and hi.

## 2014-12-17 ENCOUNTER — Emergency Department (HOSPITAL_COMMUNITY)
Admission: EM | Admit: 2014-12-17 | Discharge: 2014-12-17 | Disposition: A | Discharge status: Home / Self Care | Payer: Self-pay | Attending: Emergency Medicine | Admitting: Emergency Medicine

## 2014-12-17 ENCOUNTER — Encounter (HOSPITAL_COMMUNITY): Payer: Self-pay | Admitting: Emergency Medicine

## 2014-12-17 DIAGNOSIS — F419 Anxiety disorder, unspecified: Secondary | ICD-10-CM | POA: Insufficient documentation

## 2014-12-17 DIAGNOSIS — J029 Acute pharyngitis, unspecified: Secondary | ICD-10-CM | POA: Insufficient documentation

## 2014-12-17 DIAGNOSIS — E119 Type 2 diabetes mellitus without complications: Secondary | ICD-10-CM | POA: Insufficient documentation

## 2014-12-17 DIAGNOSIS — Z87891 Personal history of nicotine dependence: Secondary | ICD-10-CM | POA: Insufficient documentation

## 2014-12-17 DIAGNOSIS — B370 Candidal stomatitis: Secondary | ICD-10-CM | POA: Insufficient documentation

## 2014-12-17 DIAGNOSIS — M2669 Other specified disorders of temporomandibular joint: Secondary | ICD-10-CM | POA: Insufficient documentation

## 2014-12-17 DIAGNOSIS — H9209 Otalgia, unspecified ear: Secondary | ICD-10-CM | POA: Insufficient documentation

## 2014-12-17 DIAGNOSIS — Z79899 Other long term (current) drug therapy: Secondary | ICD-10-CM | POA: Insufficient documentation

## 2014-12-17 DIAGNOSIS — Z794 Long term (current) use of insulin: Secondary | ICD-10-CM | POA: Insufficient documentation

## 2014-12-17 DIAGNOSIS — K219 Gastro-esophageal reflux disease without esophagitis: Secondary | ICD-10-CM | POA: Insufficient documentation

## 2014-12-17 DIAGNOSIS — Z7951 Long term (current) use of inhaled steroids: Secondary | ICD-10-CM | POA: Insufficient documentation

## 2014-12-17 DIAGNOSIS — Z862 Personal history of diseases of the blood and blood-forming organs and certain disorders involving the immune mechanism: Secondary | ICD-10-CM | POA: Insufficient documentation

## 2014-12-17 DIAGNOSIS — F329 Major depressive disorder, single episode, unspecified: Secondary | ICD-10-CM | POA: Insufficient documentation

## 2014-12-17 DIAGNOSIS — E785 Hyperlipidemia, unspecified: Secondary | ICD-10-CM | POA: Insufficient documentation

## 2014-12-17 DIAGNOSIS — I10 Essential (primary) hypertension: Secondary | ICD-10-CM | POA: Insufficient documentation

## 2014-12-17 DIAGNOSIS — E669 Obesity, unspecified: Secondary | ICD-10-CM | POA: Insufficient documentation

## 2014-12-17 DIAGNOSIS — R29898 Other symptoms and signs involving the musculoskeletal system: Secondary | ICD-10-CM

## 2014-12-17 DIAGNOSIS — G8929 Other chronic pain: Secondary | ICD-10-CM

## 2014-12-17 DIAGNOSIS — M797 Fibromyalgia: Secondary | ICD-10-CM | POA: Insufficient documentation

## 2014-12-17 DIAGNOSIS — G629 Polyneuropathy, unspecified: Secondary | ICD-10-CM | POA: Insufficient documentation

## 2014-12-17 DIAGNOSIS — R51 Headache: Secondary | ICD-10-CM | POA: Insufficient documentation

## 2014-12-17 LAB — RAPID STREP SCREEN (MED CTR MEBANE ONLY): STREPTOCOCCUS, GROUP A SCREEN (DIRECT): NEGATIVE

## 2014-12-17 MED ORDER — METOCLOPRAMIDE HCL 10 MG PO TABS
10.0000 mg | ORAL_TABLET | Freq: Once | ORAL | Status: AC
  Administered 2014-12-17: 10 mg via ORAL
  Filled 2014-12-17: qty 1

## 2014-12-17 MED ORDER — DIPHENHYDRAMINE HCL 25 MG PO CAPS
25.0000 mg | ORAL_CAPSULE | Freq: Once | ORAL | Status: AC
  Administered 2014-12-17: 25 mg via ORAL
  Filled 2014-12-17: qty 1

## 2014-12-17 MED ORDER — NYSTATIN 100000 UNIT/ML MT SUSP
5.0000 mL | Freq: Four times a day (QID) | OROMUCOSAL | Status: DC
  Administered 2014-12-17: 500000 [IU] via ORAL
  Filled 2014-12-17: qty 5

## 2014-12-17 MED ORDER — NYSTATIN 100000 UNIT/ML MT SUSP: 500000.0000 [IU] | Freq: Four times a day (QID) | OROMUCOSAL | Status: DC

## 2014-12-17 NOTE — ED Notes (Signed)
Pt reports R ear pain x 2 months with pain radiating into jaw. Pt also c/o headache x 3 weeks and sore throat x 8 days.

## 2014-12-17 NOTE — Discharge Instructions (Signed)
Please follow up with your primary care physician in 1-2 days. If you do not have one please call the [facility] number listed above. Please use nystatin as prescribed. Please alternate between Motrin and Tylenol every three hours for pain. Please read all discharge instructions and return precautions.   Thrush, Adult  Vanessa Case, also called oral candidiasis, is a fungal infection that develops in the mouth and throat and on the tongue. It causes white patches to form on the mouth and tongue. Vanessa Case is most common in older adults, but it can occur at any age.  Many cases of thrush are mild, but this infection can also be more serious. Vanessa Case can be a recurring problem for people who have chronic illnesses or who take medicines that limit the body's ability to fight infection. Because these people have difficulty fighting infections, the fungus that causes thrush can spread throughout the body. This can cause life-threatening blood or organ infections. CAUSES  Vanessa Case is usually caused by a yeast called Candida albicans. This fungus is normally present in small amounts in the mouth and on other mucous membranes. It usually causes no harm. However, when conditions are present that allow the fungus to grow uncontrolled, it invades surrounding tissues and becomes an infection. Less often, other Candida species can also lead to thrush.  RISK FACTORS Vanessa Case is more likely to develop in the following people: 1. People with an impaired ability to fight infection (weakened immune system).  2. Older adults.  3. People with HIV.  4. People with diabetes.  5. People with dry mouth (xerostomia).  6. Pregnant women.  7. People with poor dental care, especially those who have false teeth.  8. People who use antibiotic medicines.  SIGNS AND SYMPTOMS  Vanessa Case can be a mild infection that causes no symptoms. If symptoms develop, they may include:   A burning feeling in the mouth and throat.  This can occur at the start of a thrush infection.   White patches that adhere to the mouth and tongue. The tissue around the patches may be red, raw, and painful. If rubbed (during tooth brushing, for example), the patches and the tissue of the mouth may bleed easily.   A bad taste in the mouth or difficulty tasting foods.   Cottony feeling in the mouth.   Pain during eating and swallowing. DIAGNOSIS  Your health care provider can usually diagnose thrush by looking in your mouth and asking you questions about your health.  TREATMENT  Medicines that help prevent the growth of fungi (antifungals) are the standard treatment for thrush. These medicines are either applied directly to the affected area (topical) or swallowed (oral). The treatment will depend on the severity of the condition.  Mild Thrush Mild cases of thrush may clear up with the use of an antifungal mouth rinse or lozenges. Treatment usually lasts about 14 days.  Moderate to Severe Thrush  More severe thrush infections that have spread to the esophagus are treated with an oral antifungal medicine. A topical antifungal medicine may also be used.   For some severe infections, a treatment period longer than 14 days may be needed.   Oral antifungal medicines are almost never used during pregnancy because the fetus may be harmed. However, if a pregnant woman has a rare, severe thrush infection that has spread to her blood, oral antifungal medicines may be used. In this case, the risk of harm to the mother and fetus from the severe thrush  infection may be greater than the risk posed by the use of antifungal medicines.  Persistent or Recurrent Thrush For cases of thrush that do not go away or keep coming back, treatment may involve the following:   Treatment may be needed twice as long as the symptoms last.   Treatment will include both oral and topical antifungal medicines.   People with weakened immune systems can take  an antifungal medicine on a continuous basis to prevent thrush infections.  It is important to treat conditions that make you more likely to get thrush, such as diabetes or HIV.  HOME CARE INSTRUCTIONS   Only take over-the-counter or prescription medicine as directed by your health care provider. Talk to your health care provider about an over-the-counter medicine called gentian violet, which kills bacteria and fungi.   Eat plain, unflavored yogurt as directed by your health care provider. Check the label to make sure the yogurt contains live cultures. This yogurt can help healthy bacteria grow in the mouth that can stop the growth of the fungus that causes thrush.   Try these measures to help reduce the discomfort of thrush:   Drink cold liquids such as water or iced tea.   Try flavored ice treats or frozen juices.   Eat foods that are easy to swallow, such as gelatin, ice cream, or custard.   If the patches in your mouth are painful, try drinking from a straw.   Rinse your mouth several times a day with a warm saltwater rinse. You can make the saltwater mixture with 1 tsp (6 g) of salt in 8 fl oz (0.2 L) of warm water.   If you wear dentures, remove the dentures before going to bed, brush them vigorously, and soak them in a cleaning solution as directed by your health care provider.   Women who are breastfeeding should clean their nipples with an antifungal medicine as directed by their health care provider. Dry the nipples after breastfeeding. Applying lanolin-containing body lotion may help relieve nipple soreness.  SEEK MEDICAL CARE IF:  Your symptoms are getting worse or are not improving within 7 days of starting treatment.   You have symptoms of spreading infection, such as white patches on the skin outside of the mouth.   You are nursing and you have redness, burning, or pain in the nipples that is not relieved with treatment.  MAKE SURE YOU:  Understand these  instructions.  Will watch your condition.  Will get help right away if you are not doing well or get worse. Document Released: 07/02/2004 Document Revised: 07/28/2013 Document Reviewed: 05/10/2013 [facility] Patient Information 2015 [place]. This information is not intended to replace advice given to you by your health care provider. Make sure you discuss any questions you have with your health care provider.  Temporomandibular Problems  Temporomandibular joint (TMJ) dysfunction means there are problems with the joint between your jaw and your skull. This is a joint lined by cartilage like other joints in your body but also has a small disc in the joint which keeps the bones from rubbing on each other. These joints are like other joints and can get inflamed (sore) from arthritis and other problems. When this joint gets sore, it can cause headaches and pain in the jaw and the face. CAUSES  Usually the arthritic types of problems are caused by soreness in the joint. Soreness in the joint can also be caused by overuse. This may come from grinding your teeth. It  may also come from mis-alignment in the joint. DIAGNOSIS Diagnosis of this condition can often be made by history and exam. Sometimes your caregiver may need X-rays or an MRI scan to determine the exact cause. It may be necessary to see your dentist to determine if your teeth and jaws are lined up correctly. TREATMENT  Most of the time this problem is not serious; however, sometimes it can persist (become chronic). When this happens medications that will cut down on inflammation (soreness) help. Sometimes a shot of cortisone into the joint will be helpful. If your teeth are not aligned it may help for your dentist to make a splint for your mouth that can help this problem. If no physical problems can be found, the problem may come from tension. If tension is found to be the cause, biofeedback or relaxation techniques may be helpful. HOME  CARE INSTRUCTIONS  9. Later in the day, applications of ice packs may be helpful. Ice can be used in a plastic bag with a towel around it to prevent frostbite to skin. This may be used about every 2 hours for 20 to 30 minutes, as needed while awake, or as directed by your caregiver. 10. Only take over-the-counter or prescription medicines for pain, discomfort, or fever as directed by your caregiver. 11. If physical therapy was prescribed, follow your caregiver's directions. 12. Wear mouth appliances as directed if they were given. Document Released: 07/02/2001 Document Revised: 12/30/2011 Document Reviewed: 10/09/2008 [facility] Patient Information 2015 [place], [place]. This information is not intended to replace advice given to you by your health care provider. Make sure you discuss any questions you have with your health care provider. RESOURCE GUIDE  If you do not have a primary care doctor to follow up with regarding today's visit, please call the [facility] at [phone] to make an appointment. Hours of operation are 10am - 7pm, Monday through Friday, and they have a sliding scale fee.    Dental Assistance Please contact the on-call dentist listed on your discharge papers WITHIN 48 HOURS.  If the on-call dentist agrees that your condition is emergent, there is a CHANCE (not a guarantee) that you MAY receive a savings on your visit at this point. If you wait more than 48 hours, it will not be considered an emergency.   Drs. Moreen Fowler Civils:  [address], [place], [place], [address], [phone]  Short-notice availability  Tooth evaluation: $100  Emergency Treatment: $200 (including exam, xrays, tooth extraction, and post-op visit)  Patients with Medicaid: [facility] [phone] [address], [phone] [address] [phone]  If unable to pay, or uninsured, contact [facility] [phone] in [place], [phone]  in [facility]) to become qualified for the adult dental clinic  Other Low-Cost Community Dental Services: - [facility]: [address] [place], [place], [place], [address], [phone], Ext. [phone], 2nd and 4th Thursday of the month at 6:30am.  10 clients each day by appointment, can sometimes see walk-in patients if someone does not show for an appointment. [facility]:  [address] [place], [place], [address], [phone] [facility]:  [address] [place], [place], [address], [phone] [facility] Health Department:  [phone] [facility] Health Department:  [phone] [facility] Health Department:  [phone]

## 2014-12-17 NOTE — ED Provider Notes (Signed)
CSN: [id number]     Arrival date & time 12/17/14  2100 History   First MD Initiated Contact with Patient 12/17/14 2117     Chief Complaint  Patient presents with  . Otalgia  . Headache     (Consider location/radiation/quality/duration/timing/severity/associated sxs/prior Treatment) HPI Comments: Patient is a 44 yo F PMHx significant for Anemia, GERD, Depression, Anxiety, DM, HTN, HLD, Substance abuse, Chronic Headaches presenting to the ED for multiple complaints. The patient's first complaint is 2 months of right ear/jaw pain. She states her pain is worse with eating, chewing gum, any activity required opening and closing her jaw. She endorses associated click with opening and closing. She has tried Ibuprofen with minimal relief. Patient's second complaint is recurrence of a migraine headache x 3 weeks with little to no improvement from Ibuprofen, BC Powder and Benadryl. Denies any changes from previous headaches. Does not endorse worse headache of her life. Patient also is complaining of a sore throat x 8 days worse with eating and drinking. Denies any fevers, chills, nausea,v omiting, diarrhea, abdominal pain, CP, SOB.   Patient is a 44 y.o. female presenting with ear pain and headaches.  Otalgia Associated symptoms: headaches and sore throat   Associated symptoms: no abdominal pain, no cough, no diarrhea, no fever and no vomiting   Headache Associated symptoms: ear pain and sore throat   Associated symptoms: no abdominal pain, no cough, no diarrhea, no dizziness, no fever, no nausea, no numbness, no seizures, no vomiting and no weakness     Past Medical History  Diagnosis Date  . Anemia   . GERD (gastroesophageal reflux disease)   . Depression   . Anxiety   . 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 Hypertension     maintained on lisinopril hct, metoprolol-134/86 at pat visit  . Neuropathy   . Arthritis   .  Hyperlipidemia   . Neuromuscular disorder   . Substance abuse   . Chronic headache   . Fibromyalgia   . Obesity    Past Surgical History  Procedure Laterality Date  . Cesarean section  x3  . Abdominal hysterectomy  10/30/2011    Procedure: HYSTERECTOMY ABDOMINAL;  Surgeon: Bing Plume, MD;  Location: [facility];  Service: Gynecology;  Laterality: N/A;  . Salpingoophorectomy  10/30/2011    Procedure: SALPINGO OOPHERECTOMY;  Surgeon: Bing Plume, MD;  Location: [facility];  Service: Gynecology;  Laterality: Right;  . Cholecystectomy N/A 06/28/2013    Procedure: LAPAROSCOPIC CHOLECYSTECTOMY;  Surgeon: Shelly Rubenstein, MD;  Location: [facility] OR;  Service: General;  Laterality: N/A;  . Wrist surgery      carpel tunnel and tendonitis   Family History  Problem Relation Age of Onset  . Cystic fibrosis Father   . Diabetes Mother   . Diabetes Maternal Grandmother   . Heart disease Maternal Grandfather    History  Substance Use Topics  . Smoking status: Former Smoker    Quit date: 10/24/1997  . Smokeless tobacco: Never Used  . Alcohol Use: No   OB History    Gravida Para Term Preterm AB TAB SAB Ectopic Multiple Living   Review of Systems  Constitutional: Negative for fever and chills.  HENT: Positive for ear pain and sore throat. Negative for sneezing and trouble swallowing.   Eyes: Negative for visual disturbance.  Respiratory: Negative for cough and shortness of breath.   Cardiovascular: Negative for chest pain.  Gastrointestinal: Negative for nausea, vomiting, abdominal pain and diarrhea.  Neurological: Positive for headaches. Negative for dizziness, seizures, syncope, weakness, light-headedness and numbness.  All other systems reviewed and are negative.     Allergies  Percocet  Home Medications   Prior to Admission medications   Medication Sig Start Date End Date Taking? Authorizing Provider  FLUoxetine (PROZAC) 20 MG capsule Take 3 capsules (60 mg total)  by mouth daily. 09/21/14   Beau Fanny, FNP  fluticasone (FLOVENT HFA) 44 MCG/ACT inhaler Inhale 2 puffs into the lungs 2 (two) times daily as needed (for shortness of breath).     Historical Provider, MD  gabapentin (NEURONTIN) 300 MG capsule Take 1 capsule (300 mg total) by mouth 2 (two) times daily. 09/21/14   Beau Fanny, FNP  hydrOXYzine (ATARAX/VISTARIL) 50 MG tablet Take 1 tablet (50 mg total) by mouth 3 (three) times daily as needed for anxiety. 09/21/14   Beau Fanny, FNP  insulin lispro protamine-lispro (HUMALOG 75/25 MIX) (75-25) 100 UNIT/ML SUSP injection Inject 65 Units into the skin 2 (two) times daily. 09/21/14   Beau Fanny, FNP  lisinopril (PRINIVIL,ZESTRIL) 20 MG tablet Take 1 tablet (20 mg total) by mouth daily. 09/21/14   Beau Fanny, FNP  lovastatin (MEVACOR) 20 MG tablet Take 1 tablet (20 mg total) by mouth at bedtime. 09/21/14   Beau Fanny, FNP  metFORMIN (GLUCOPHAGE-XR) 500 MG 24 hr tablet Take 4 tablets (2,000 mg total) by mouth daily with breakfast. 09/21/14   Beau Fanny, FNP  mometasone-formoterol [facility]) 200-5 MCG/ACT AERO Inhale 2 puffs into the lungs 2 (two) times daily as needed for wheezing.    Historical Provider, MD  nystatin (MYCOSTATIN) 100000 UNIT/ML suspension Take 5 mLs (500,000 Units total) by mouth 4 (four) times daily. 12/17/14   Elandra Powell L Deloria Brassfield, PA-C  omeprazole (PRILOSEC) 20 MG capsule Take 1 capsule (20 mg total) by mouth daily. 09/21/14   Beau Fanny, FNP  ondansetron (ZOFRAN) 4 MG tablet Take 1 tablet (4 mg total) by mouth every 8 (eight) hours as needed for nausea or vomiting. 09/21/14   Beau Fanny, FNP  traZODone (DESYREL) 50 MG tablet Take 1 tablet (50 mg total) by mouth at bedtime and may repeat dose one time if needed. 09/21/14   Everardo All Withrow, FNP   BP 128/80 mmHg  Pulse 100  Temp(Src) 97.8 F (36.6 C) (Oral)  Resp 16  Ht  (1.651 m)  Wt 225 lb (102.059 kg)  BMI 37.44 kg/m2  SpO2 99%  LMP 10/07/2011 Physical  Exam  Constitutional: She is oriented to person, place, and time. She appears well-developed and well-nourished. No distress.  HENT:  Head: Normocephalic and atraumatic.  Right Ear: External ear normal.  Left Ear: External ear normal.  Nose: Nose normal.  Mouth/Throat: Oropharynx is clear and moist. No oropharyngeal exudate.  Tongue with removable white plaques. No other intraoral lesions.   Eyes: Conjunctivae and EOM are normal. Pupils are equal, round, and reactive to light.  Neck: Normal range of motion. Neck supple.  Cardiovascular: Normal rate, regular rhythm, normal heart sounds and intact distal pulses.   Pulmonary/Chest: Effort normal and breath sounds normal. No respiratory distress.  Abdominal: Soft. There is no tenderness.  Musculoskeletal:  Left wrist in velcro thumb spica  Lymphadenopathy:    She has no cervical adenopathy.  Neurological: She is alert and  oriented to person, place, and time. She has normal strength. No cranial nerve deficit. Gait normal. GCS eye subscore is 4. GCS verbal subscore is 5. GCS motor subscore is 6.  Sensation grossly intact.  No pronator drift.  Bilateral heel-knee-shin intact.  Skin: Skin is warm and dry. She is not diaphoretic.  Nursing note and vitals reviewed.   ED Course  Procedures (including critical care time) Medications  diphenhydrAMINE (BENADRYL) capsule 25 mg (25 mg Oral Given 12/17/14 2137)  metoCLOPramide (REGLAN) tablet 10 mg (10 mg Oral Given 12/17/14 2137)    Labs Review Labs Reviewed  RAPID STREP SCREEN  CULTURE, GROUP A STREP    Imaging Review No results found.   EKG Interpretation None      MDM   Final diagnoses:  TMJ click  Chronic nonintractable headache, unspecified headache type  Thrush, oral    Filed Vitals:   12/17/14 2210  BP: 128/80  Pulse: 100  Temp: 97.8 F (36.6 C)  Resp: 16   Afebrile, NAD, non-toxic appearing, AAOx4.   1) Thrush: Physical exam consistent with thrush. White plaques  are movable. Likely secondary to diabetes. Nystatin suspension will be given.  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 Advised patient needs to see a dentist and/or oral surgeon for further evaluation and management. Symptomatic measures were discussed. Advised patient refrain from excessive gum chewing.  3) HA: Pt HA treated and improved while in ED.  Presentation is like pts typical HA and non concerning for [facility], ICH, Meningitis, or temporal arteritis. Pt is afebrile with no focal neuro deficits, nuchal rigidity, or change in vision. Pt is to follow up with PCP to discuss prophylactic medication. Pt verbalizes understanding and is agreeable with plan to dc.   Return precautions discussed. Patient is agreeable to plan. Patient stable at time of discharge.   Jeannetta EllisJennifer L Nayel Purdy, PA-C 12/18/14 0500  Vanetta MuldersScott Zackowski, MD 12/18/14 [phone]

## 2014-12-20 LAB — CULTURE, GROUP A STREP: STREP A CULTURE: NEGATIVE

## 2015-02-15 ENCOUNTER — Emergency Department (HOSPITAL_COMMUNITY)
Admission: EM | Admit: 2015-02-15 | Discharge: 2015-02-15 | Disposition: A | Discharge status: Home / Self Care | Payer: Medicaid Other | Attending: Emergency Medicine | Admitting: Emergency Medicine

## 2015-02-15 ENCOUNTER — Encounter (HOSPITAL_COMMUNITY): Payer: Self-pay | Admitting: Emergency Medicine

## 2015-02-15 ENCOUNTER — Emergency Department (HOSPITAL_COMMUNITY): Payer: Medicaid Other

## 2015-02-15 DIAGNOSIS — S199XXA Unspecified injury of neck, initial encounter: Secondary | ICD-10-CM | POA: Insufficient documentation

## 2015-02-15 DIAGNOSIS — S8991XA Unspecified injury of right lower leg, initial encounter: Secondary | ICD-10-CM | POA: Insufficient documentation

## 2015-02-15 DIAGNOSIS — E669 Obesity, unspecified: Secondary | ICD-10-CM | POA: Insufficient documentation

## 2015-02-15 DIAGNOSIS — E785 Hyperlipidemia, unspecified: Secondary | ICD-10-CM | POA: Diagnosis not present

## 2015-02-15 DIAGNOSIS — S4991XA Unspecified injury of right shoulder and upper arm, initial encounter: Secondary | ICD-10-CM | POA: Diagnosis present

## 2015-02-15 DIAGNOSIS — Y998 Other external cause status: Secondary | ICD-10-CM | POA: Insufficient documentation

## 2015-02-15 DIAGNOSIS — F319 Bipolar disorder, unspecified: Secondary | ICD-10-CM | POA: Diagnosis not present

## 2015-02-15 DIAGNOSIS — I10 Essential (primary) hypertension: Secondary | ICD-10-CM | POA: Insufficient documentation

## 2015-02-15 DIAGNOSIS — F419 Anxiety disorder, unspecified: Secondary | ICD-10-CM | POA: Diagnosis not present

## 2015-02-15 DIAGNOSIS — Y9389 Activity, other specified: Secondary | ICD-10-CM | POA: Diagnosis not present

## 2015-02-15 DIAGNOSIS — Z7952 Long term (current) use of systemic steroids: Secondary | ICD-10-CM | POA: Insufficient documentation

## 2015-02-15 DIAGNOSIS — G8929 Other chronic pain: Secondary | ICD-10-CM | POA: Diagnosis not present

## 2015-02-15 DIAGNOSIS — M1990 Unspecified osteoarthritis, unspecified site: Secondary | ICD-10-CM | POA: Diagnosis not present

## 2015-02-15 DIAGNOSIS — Y9289 Other specified places as the place of occurrence of the external cause: Secondary | ICD-10-CM | POA: Diagnosis not present

## 2015-02-15 DIAGNOSIS — W108XXA Fall (on) (from) other stairs and steps, initial encounter: Secondary | ICD-10-CM | POA: Diagnosis not present

## 2015-02-15 DIAGNOSIS — Z862 Personal history of diseases of the blood and blood-forming organs and certain disorders involving the immune mechanism: Secondary | ICD-10-CM | POA: Insufficient documentation

## 2015-02-15 DIAGNOSIS — W19XXXA Unspecified fall, initial encounter: Secondary | ICD-10-CM

## 2015-02-15 DIAGNOSIS — K219 Gastro-esophageal reflux disease without esophagitis: Secondary | ICD-10-CM | POA: Insufficient documentation

## 2015-02-15 DIAGNOSIS — Z87891 Personal history of nicotine dependence: Secondary | ICD-10-CM | POA: Insufficient documentation

## 2015-02-15 DIAGNOSIS — M62838 Other muscle spasm: Secondary | ICD-10-CM | POA: Insufficient documentation

## 2015-02-15 DIAGNOSIS — Y92009 Unspecified place in unspecified non-institutional (private) residence as the place of occurrence of the external cause: Secondary | ICD-10-CM

## 2015-02-15 DIAGNOSIS — M25561 Pain in right knee: Secondary | ICD-10-CM

## 2015-02-15 DIAGNOSIS — Z794 Long term (current) use of insulin: Secondary | ICD-10-CM | POA: Diagnosis not present

## 2015-02-15 DIAGNOSIS — M25511 Pain in right shoulder: Secondary | ICD-10-CM

## 2015-02-15 HISTORY — DX: Other chronic pain: G89.29

## 2015-02-15 HISTORY — DX: Unspecified abdominal pain: R10.9

## 2015-02-15 HISTORY — DX: Dorsalgia, unspecified: M54.9

## 2015-02-15 HISTORY — DX: Cervicalgia: M54.2

## 2015-02-15 LAB — CBG MONITORING, ED: Glucose-Capillary: 307 mg/dL — ABNORMAL HIGH (ref 70–99)

## 2015-02-15 MED ORDER — NAPROXEN 500 MG PO TABS: 500.0000 mg | ORAL_TABLET | Freq: Two times a day (BID) | ORAL | Status: DC

## 2015-02-15 MED ORDER — LIDOCAINE 5 % EX PTCH: 1.0000 | MEDICATED_PATCH | CUTANEOUS | Status: DC

## 2015-02-15 MED ORDER — CYCLOBENZAPRINE HCL 5 MG PO TABS: 5.0000 mg | ORAL_TABLET | Freq: Three times a day (TID) | ORAL | Status: DC | PRN

## 2015-02-15 MED ORDER — KETOROLAC TROMETHAMINE 60 MG/2ML IM SOLN
60.0000 mg | Freq: Once | INTRAMUSCULAR | Status: AC
  Administered 2015-02-15: 60 mg via INTRAMUSCULAR
  Filled 2015-02-15: qty 2

## 2015-02-15 NOTE — Discharge Instructions (Signed)
Your imaging was normal.  Nothing is broken. Apply ice and heat to your neck.  WHEN SHOULD I SEEK IMMEDIATE MEDICAL CARE?  You should get help right away if: You have confusion or drowsiness. You feel sick to your stomach (nauseous) or have continued, forceful vomiting. You have dizziness or unsteadiness that is getting worse. You have severe, continued headaches not relieved by medicine. Only take over-the-counter or prescription medicines for pain, fever, or discomfort as directed by your health care provider. You do not have normal function of the arms or legs or are unable to walk. You notice changes in the black spots in the center of the colored part of your eye (pupil). You have a clear or bloody fluid coming from your nose or ears. You have a loss of vision.   Fall Prevention and Home Safety Falls cause injuries and can affect all age groups. It is possible to use preventive measures to significantly decrease the likelihood of falls. There are many simple measures which can make your home safer and prevent falls. OUTDOORS  Repair cracks and edges of walkways and driveways.  Remove high doorway thresholds.  Trim shrubbery on the main path into your home.  Have good outside lighting.  Clear walkways of tools, rocks, debris, and clutter.  Check that handrails are not broken and are securely fastened. Both sides of steps should have handrails.  Have leaves, snow, and ice cleared regularly.  Use sand or salt on walkways during winter months.  In the garage, clean up grease or oil spills. BATHROOM  Install night lights.  Install grab bars by the toilet and in the tub and shower.  Use non-skid mats or decals in the tub or shower.  Place a plastic non-slip stool in the shower to sit on, if needed.  Keep floors dry and clean up all water on the floor immediately.  Remove soap buildup in the tub or shower on a regular basis.  Secure bath mats with non-slip,  double-sided rug tape.  Remove throw rugs and tripping hazards from the floors. BEDROOMS  Install night lights.  Make sure a bedside light is easy to reach.  Do not use oversized bedding.  Keep a telephone by your bedside.  Have a firm chair with side arms to use for getting dressed.  Remove throw rugs and tripping hazards from the floor. KITCHEN  Keep handles on pots and pans turned toward the center of the stove. Use back burners when possible.  Clean up spills quickly and allow time for drying.  Avoid walking on wet floors.  Avoid hot utensils and knives.  Position shelves so they are not too high or low.  Place commonly used objects within easy reach.  If necessary, use a sturdy step stool with a grab bar when reaching.  Keep electrical cables out of the way.  Do not use floor polish or wax that makes floors slippery. If you must use wax, use non-skid floor wax.  Remove throw rugs and tripping hazards from the floor. STAIRWAYS  Never leave objects on stairs.  Place handrails on both sides of stairways and use them. Fix any loose handrails. Make sure handrails on both sides of the stairways are as long as the stairs.  Check carpeting to make sure it is firmly attached along stairs. Make repairs to worn or loose carpet promptly.  Avoid placing throw rugs at the top or bottom of stairways, or properly secure the rug with carpet tape to prevent slippage.  Get rid of throw rugs, if possible.  Have an electrician put in a light switch at the top and bottom of the stairs. OTHER FALL PREVENTION TIPS  Wear low-heel or rubber-soled shoes that are supportive and fit well. Wear closed toe shoes.  When using a stepladder, make sure it is fully opened and both spreaders are firmly locked. Do not climb a closed stepladder.  Add color or contrast paint or tape to grab bars and handrails in your home. Place contrasting color strips on first and last steps.  Learn and use  mobility aids as needed. Install an electrical emergency response system.  Turn on lights to avoid dark areas. Replace light bulbs that burn out immediately. Get light switches that glow.  Arrange furniture to create clear pathways. Keep furniture in the same place.  Firmly attach carpet with non-skid or double-sided tape.  Eliminate uneven floor surfaces.  Select a carpet pattern that does not visually hide the edge of steps.  Be aware of all pets. OTHER HOME SAFETY TIPS  Set the water temperature for 120 F (48.8 C).  Keep emergency numbers on or near the telephone.  Keep smoke detectors on every level of the home and near sleeping areas. Document Released: 09/27/2002 Document Revised: 04/07/2012 Document Reviewed: 12/27/2011 [facility] Patient Information 2015 [place], [place]. This information is not intended to replace advice given to you by your health care provider. Make sure you discuss any questions you have with your health care provider.

## 2015-02-15 NOTE — ED Notes (Signed)
Off unit with CT 

## 2015-02-15 NOTE — ED Notes (Signed)
Pt. States she fell down 6 steps. Pt. States she "blacked out a little bit." pt. States she has been nauseated since yesterday. Pt. AxO x4. Pt. Ambulatory.

## 2015-02-15 NOTE — ED Provider Notes (Signed)
CSN: [id number]     Arrival date & time 02/15/15  0800 History   First MD Initiated Contact with Patient 02/15/15 [phone]     Chief Complaint  Patient presents with  . Fall     (Consider location/radiation/quality/duration/timing/severity/associated sxs/prior Treatment) HPI   This is a 44 year old female with past medical history of bipolar disorder, chronic pain of the neck, back, abdomen, hip. Previous history of substance abuse, fibromyalgia, diabetes, neuropathy, who presents emergency Department with chief complaint of fall. Patient states she fell down 6 stairs yesterday. She states she lost consciousness for a brief second. She is complaining of neck pain on the right side, nausea and vomiting, which she thinks is "due to my pain." She denies changes in vision or other focal neurologic deficits. Patient complained of right shoulder pain, right knee pain. She states she is able to ambulate but "with a limp." She has no other complaints at this time. Patient states she took 800 mg ibuprofen without relief of her symptoms.  Past Medical History  Diagnosis Date  . Anemia   . GERD (gastroesophageal reflux disease)   . Depression   . Anxiety   . 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 Hypertension     maintained on lisinopril hct, metoprolol-134/86 at pat visit  . Neuropathy   . Arthritis   . Hyperlipidemia   . Neuromuscular disorder   . Substance abuse   . Chronic headache   . Fibromyalgia   . Obesity   . Chronic neck pain   . Chronic back pain   . Chronic abdominal pain    Past Surgical History  Procedure Laterality Date  . Cesarean section  x3  . Abdominal hysterectomy  10/30/2011    Procedure: HYSTERECTOMY ABDOMINAL;  Surgeon: Bing Plumehomas F Henley, MD;  Location: [facility];  Service: Gynecology;  Laterality: N/A;  . Salpingoophorectomy  10/30/2011    Procedure: SALPINGO OOPHERECTOMY;  Surgeon: Bing Plumehomas F Henley,  MD;  Location: [facility];  Service: Gynecology;  Laterality: Right;  . Cholecystectomy N/A 06/28/2013    Procedure: LAPAROSCOPIC CHOLECYSTECTOMY;  Surgeon: Shelly Rubensteinouglas A Blackman, MD;  Location: [facility];  Service: General;  Laterality: N/A;  . Wrist surgery      carpel tunnel and tendonitis   Family History  Problem Relation Age of Onset  . Cystic fibrosis Father   . Diabetes Mother   . Diabetes Maternal Grandmother   . Heart disease Maternal Grandfather    History  Substance Use Topics  . Smoking status: Former Smoker    Quit date: 10/24/1997  . Smokeless tobacco: Never Used  . Alcohol Use: No   OB History    Gravida Para Term Preterm AB TAB SAB Ectopic Multiple Living   3 3 3       3      Review of Systems  Ten systems reviewed and are negative for acute change, except as noted in the HPI.    Allergies  Percocet  Home Medications   Prior to Admission medications   Medication Sig Start Date End Date Taking? Authorizing Provider  FLUoxetine (PROZAC) 20 MG capsule Take 3 capsules (60 mg total) by mouth daily. 09/21/14   Beau FannyJohn C Withrow, FNP  fluticasone (FLOVENT HFA) 44 MCG/ACT inhaler Inhale 2 puffs into the lungs 2 (two) times daily as needed (for shortness of breath).     Historical Provider, MD  gabapentin (NEURONTIN) 300 MG capsule Take  1 capsule (300 mg total) by mouth 2 (two) times daily. 09/21/14   Beau Fanny, FNP  hydrOXYzine (ATARAX/VISTARIL) 50 MG tablet Take 1 tablet (50 mg total) by mouth 3 (three) times daily as needed for anxiety. 09/21/14   Beau Fanny, FNP  insulin lispro protamine-lispro (HUMALOG 75/25 MIX) (75-25) 100 UNIT/ML SUSP injection Inject 65 Units into the skin 2 (two) times daily. 09/21/14   Beau Fanny, FNP  lisinopril (PRINIVIL,ZESTRIL) 20 MG tablet Take 1 tablet (20 mg total) by mouth daily. 09/21/14   Beau Fanny, FNP  lovastatin (MEVACOR) 20 MG tablet Take 1 tablet (20 mg total) by mouth at bedtime. 09/21/14   Beau Fanny, FNP  metFORMIN  (GLUCOPHAGE-XR) 500 MG 24 hr tablet Take 4 tablets (2,000 mg total) by mouth daily with breakfast. 09/21/14   Beau Fanny, FNP  mometasone-formoterol [facility] 200-5 MCG/ACT AERO Inhale 2 puffs into the lungs 2 (two) times daily as needed for wheezing.    Historical Provider, MD  nystatin (MYCOSTATIN) 100000 UNIT/ML suspension Take 5 mLs (500,000 Units total) by mouth 4 (four) times daily. 12/17/14   Jennifer Piepenbrink, PA-C  omeprazole (PRILOSEC) 20 MG capsule Take 1 capsule (20 mg total) by mouth daily. 09/21/14   Beau Fanny, FNP  ondansetron (ZOFRAN) 4 MG tablet Take 1 tablet (4 mg total) by mouth every 8 (eight) hours as needed for nausea or vomiting. 09/21/14   Beau Fanny, FNP  traZODone (DESYREL) 50 MG tablet Take 1 tablet (50 mg total) by mouth at bedtime and may repeat dose one time if needed. 09/21/14   Everardo All Withrow, FNP   BP 139/79 mmHg  Pulse 103  Temp(Src) 97.5 F (36.4 C) (Oral)  Resp 14  SpO2 100%  LMP 10/07/2011 Physical Exam  Constitutional: She is oriented to person, place, and time. She appears well-developed and well-nourished. No distress.  HENT:  Head: Normocephalic and atraumatic.  No hematomas.  Eyes: Conjunctivae are normal. No scleral icterus.  Neck: Normal range of motion.  No midline cervical tenderness. She is tender to palpation in the lateral musculature. Full range of motion, however, somewhat limited due to pain on the right side with left lateral flexion and left rotation.  Cardiovascular: Normal rate, regular rhythm and normal heart sounds.  Exam reveals no gallop and no friction rub.   No murmur heard. Pulmonary/Chest: Effort normal and breath sounds normal. No respiratory distress.  Abdominal: Soft. Bowel sounds are normal. She exhibits no distension and no mass. There is no tenderness. There is no guarding.  Musculoskeletal:  Right shoulder examination reveals full range of motion and strength. No bony tenderness or deformities. Right knee  examination reveals full range of motion and strength with no ligamentous laxity or crepitus. No bruising or deformity noted.  Neurological: She is alert and oriented to person, place, and time.  Speech is clear and goal oriented, follows commands Major Cranial nerves without deficit, no facial droop Normal strength in upper and lower extremities bilaterally including dorsiflexion and plantar flexion, strong and equal grip strength Sensation normal to light and sharp touch Moves extremities without ataxia, coordination intact Normal finger to nose and rapid alternating movements Neg romberg, no pronator drift Antalgic gait Normal heel-shin and balance   Skin: Skin is warm and dry. She is not diaphoretic.  Nursing note and vitals reviewed.   ED Course  Procedures (including critical care time) Labs Review Labs Reviewed - No data to display  Imaging Review No  results found.   EKG Interpretation None      MDM   Final diagnoses:  Fall at home, initial encounter  Fall  Fall    Patient with normal neurologic exam, negative CT scan of the head and neck. No clinical indication for imaging of the knee or shoulder. Patient will be discharged with naproxen, Lidoderm, and Flexeril. No narcotics given. She appears appropriate for discharge at this time. Follow up with her primary care physician.    Arthor Captain, PA-C 02/15/15 1535  Samuel Jester, DO 02/17/15 2150

## 2015-03-20 ENCOUNTER — Emergency Department (HOSPITAL_COMMUNITY): Payer: Medicaid Other

## 2015-03-20 ENCOUNTER — Encounter (HOSPITAL_COMMUNITY): Payer: Self-pay | Admitting: *Deleted

## 2015-03-20 ENCOUNTER — Emergency Department (HOSPITAL_COMMUNITY)
Admission: EM | Admit: 2015-03-20 | Discharge: 2015-03-20 | Disposition: A | Discharge status: Home / Self Care | Payer: Medicaid Other | Attending: Emergency Medicine | Admitting: Emergency Medicine

## 2015-03-20 DIAGNOSIS — E119 Type 2 diabetes mellitus without complications: Secondary | ICD-10-CM | POA: Insufficient documentation

## 2015-03-20 DIAGNOSIS — R300 Dysuria: Secondary | ICD-10-CM

## 2015-03-20 DIAGNOSIS — Z90710 Acquired absence of both cervix and uterus: Secondary | ICD-10-CM | POA: Insufficient documentation

## 2015-03-20 DIAGNOSIS — N390 Urinary tract infection, site not specified: Secondary | ICD-10-CM | POA: Diagnosis not present

## 2015-03-20 DIAGNOSIS — Z791 Long term (current) use of non-steroidal anti-inflammatories (NSAID): Secondary | ICD-10-CM | POA: Insufficient documentation

## 2015-03-20 DIAGNOSIS — M1990 Unspecified osteoarthritis, unspecified site: Secondary | ICD-10-CM | POA: Insufficient documentation

## 2015-03-20 DIAGNOSIS — Z792 Long term (current) use of antibiotics: Secondary | ICD-10-CM | POA: Diagnosis not present

## 2015-03-20 DIAGNOSIS — Z7951 Long term (current) use of inhaled steroids: Secondary | ICD-10-CM | POA: Diagnosis not present

## 2015-03-20 DIAGNOSIS — F419 Anxiety disorder, unspecified: Secondary | ICD-10-CM | POA: Insufficient documentation

## 2015-03-20 DIAGNOSIS — Z862 Personal history of diseases of the blood and blood-forming organs and certain disorders involving the immune mechanism: Secondary | ICD-10-CM | POA: Insufficient documentation

## 2015-03-20 DIAGNOSIS — E669 Obesity, unspecified: Secondary | ICD-10-CM | POA: Insufficient documentation

## 2015-03-20 DIAGNOSIS — Z79899 Other long term (current) drug therapy: Secondary | ICD-10-CM | POA: Insufficient documentation

## 2015-03-20 DIAGNOSIS — Z87891 Personal history of nicotine dependence: Secondary | ICD-10-CM | POA: Diagnosis not present

## 2015-03-20 DIAGNOSIS — K219 Gastro-esophageal reflux disease without esophagitis: Secondary | ICD-10-CM | POA: Insufficient documentation

## 2015-03-20 DIAGNOSIS — G8929 Other chronic pain: Secondary | ICD-10-CM | POA: Insufficient documentation

## 2015-03-20 DIAGNOSIS — I10 Essential (primary) hypertension: Secondary | ICD-10-CM | POA: Diagnosis not present

## 2015-03-20 LAB — BASIC METABOLIC PANEL
Anion gap: 6 (ref 5–15)
BUN: <5 mg/dL — ABNORMAL LOW (ref 6–20)
CHLORIDE: 101 mmol/L (ref 101–111)
CO2: 27 mmol/L (ref 22–32)
CREATININE: 0.61 mg/dL (ref 0.44–1.00)
Calcium: 9.9 mg/dL (ref 8.9–10.3)
GFR calc Af Amer: >60 mL/min (ref >60)
Glucose, Bld: 218 mg/dL — ABNORMAL HIGH (ref 65–99)
POTASSIUM: 4.1 mmol/L (ref 3.5–5.1)
SODIUM: 134 mmol/L — AB (ref 135–145)

## 2015-03-20 LAB — CBC WITH DIFFERENTIAL/PLATELET
Basophils Absolute: 0 10*3/uL (ref 0.0–0.1)
Basophils Relative: 0 % (ref 0–1)
Eosinophils Absolute: 0.1 10*3/uL (ref 0.0–0.7)
Eosinophils Relative: 1 % (ref 0–5)
HCT: 39.8 % (ref 36.0–46.0)
HEMOGLOBIN: 13.5 g/dL (ref 12.0–15.0)
LYMPHS PCT: 33 % (ref 12–46)
Lymphs Abs: 3.6 10*3/uL (ref 0.7–4.0)
MCH: 28.5 pg (ref 26.0–34.0)
MCHC: 33.9 g/dL (ref 30.0–36.0)
MCV: 84.1 fL (ref 78.0–100.0)
MONO ABS: 0.4 10*3/uL (ref 0.1–1.0)
Monocytes Relative: 4 % (ref 3–12)
Neutro Abs: 7 10*3/uL (ref 1.7–7.7)
Neutrophils Relative %: 62 % (ref 43–77)
Platelets: 269 10*3/uL (ref 150–400)
RBC: 4.73 MIL/uL (ref 3.87–5.11)
RDW: 13.1 % (ref 11.5–15.5)
WBC: 11.2 10*3/uL — ABNORMAL HIGH (ref 4.0–10.5)

## 2015-03-20 LAB — URINE MICROSCOPIC-ADD ON

## 2015-03-20 LAB — URINALYSIS, ROUTINE W REFLEX MICROSCOPIC
Bilirubin Urine: NEGATIVE
Glucose, UA: 500 mg/dL — AB
KETONES UR: 15 mg/dL — AB
NITRITE: NEGATIVE
Protein, ur: 100 mg/dL — AB
Specific Gravity, Urine: 1.025 (ref 1.005–1.030)
Urobilinogen, UA: 0.2 mg/dL (ref 0.0–1.0)
pH: 7 (ref 5.0–8.0)

## 2015-03-20 MED ORDER — ONDANSETRON 4 MG PO TBDP: 4.0000 mg | ORAL_TABLET | Freq: Three times a day (TID) | ORAL | Status: DC | PRN

## 2015-03-20 MED ORDER — DEXTROSE 5 % IV SOLN
1.0000 g | Freq: Once | INTRAVENOUS | Status: AC
  Administered 2015-03-20: 1 g via INTRAVENOUS
  Filled 2015-03-20: qty 10

## 2015-03-20 MED ORDER — SODIUM CHLORIDE 0.9 % IV BOLUS (SEPSIS)
1000.0000 mL | Freq: Once | INTRAVENOUS | Status: AC
  Administered 2015-03-20: 1000 mL via INTRAVENOUS

## 2015-03-20 MED ORDER — PHENAZOPYRIDINE HCL 95 MG PO TABS: 95.0000 mg | ORAL_TABLET | Freq: Three times a day (TID) | ORAL | Status: DC | PRN

## 2015-03-20 MED ORDER — METFORMIN HCL ER 500 MG PO TB24: 2000.0000 mg | ORAL_TABLET | Freq: Every day | ORAL | Status: DC

## 2015-03-20 MED ORDER — MORPHINE SULFATE 4 MG/ML IJ SOLN
4.0000 mg | Freq: Once | INTRAMUSCULAR | Status: AC
  Administered 2015-03-20: 4 mg via INTRAVENOUS
  Filled 2015-03-20: qty 1

## 2015-03-20 MED ORDER — MELOXICAM 7.5 MG PO TABS: 15.0000 mg | ORAL_TABLET | Freq: Every day | ORAL | Status: DC

## 2015-03-20 MED ORDER — CEPHALEXIN 500 MG PO CAPS: 500.0000 mg | ORAL_CAPSULE | Freq: Four times a day (QID) | ORAL | Status: DC

## 2015-03-20 MED ORDER — LISINOPRIL 20 MG PO TABS: 20.0000 mg | ORAL_TABLET | Freq: Every day | ORAL | Status: DC

## 2015-03-20 MED ORDER — ONDANSETRON HCL 4 MG/2ML IJ SOLN
4.0000 mg | Freq: Once | INTRAMUSCULAR | Status: AC
  Administered 2015-03-20: 4 mg via INTRAVENOUS
  Filled 2015-03-20: qty 2

## 2015-03-20 MED ORDER — INSULIN LISPRO PROT & LISPRO (75-25 MIX) 100 UNIT/ML ~~LOC~~ SUSP: 65.0000 [IU] | Freq: Two times a day (BID) | SUBCUTANEOUS | Status: DC

## 2015-03-20 NOTE — ED Provider Notes (Signed)
CSN: [id number]     Arrival date & time 03/20/15  1337 History   First MD Initiated Contact with Patient 03/20/15 1514     Chief Complaint  Patient presents with  . Abdominal Pain  . Dysuria     (Consider location/radiation/quality/duration/timing/severity/associated sxs/prior Treatment) HPI Comments: Patient is a 44 yo F PMHx significant for anemia, GERD<,DM, HTN, HLD, substance abuse, chronic headaches, chronic pain presenting to the ED for evaluation of severe dysuria with associated hematuria. She states her suprapubic abdominal pain began yesterday with mild dysuria, but her symptoms became more severe today. Patient denies any fevers, chills, nausea, vomiting, diarrhea. Abdominal surgical history includes hysterectomy. He does have a history of kidney stones.  Patient also endorses that she has been out of her metformin and insulin. States her sugars have been running high.   Past Medical History  Diagnosis Date  . Anemia   . GERD (gastroesophageal reflux disease)   . Depression   . Anxiety   . 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 Hypertension     maintained on lisinopril hct, metoprolol-134/86 at pat visit  . Neuropathy   . Arthritis   . Hyperlipidemia   . Neuromuscular disorder   . Substance abuse   . Chronic headache   . Fibromyalgia   . Obesity   . Chronic neck pain   . Chronic back pain   . Chronic abdominal pain    Past Surgical History  Procedure Laterality Date  . Cesarean section  x3  . Abdominal hysterectomy  10/30/2011    Procedure: HYSTERECTOMY ABDOMINAL;  Surgeon: Bing Plume, MD;  Location: [facility];  Service: Gynecology;  Laterality: N/A;  . Salpingoophorectomy  10/30/2011    Procedure: SALPINGO OOPHERECTOMY;  Surgeon: Bing Plume, MD;  Location: [facility];  Service: Gynecology;  Laterality: Right;  . Cholecystectomy N/A 06/28/2013    Procedure: LAPAROSCOPIC CHOLECYSTECTOMY;   Surgeon: Shelly Rubenstein, MD;  Location: [facility];  Service: General;  Laterality: N/A;  . Wrist surgery      carpel tunnel and tendonitis   Family History  Problem Relation Age of Onset  . Cystic fibrosis Father   . Diabetes Mother   . Diabetes Maternal Grandmother   . Heart disease Maternal Grandfather    History  Substance Use Topics  . Smoking status: Former Smoker    Quit date: 10/24/1997  . Smokeless tobacco: Never Used  . Alcohol Use: No   OB History    Gravida Para Term Preterm AB TAB SAB Ectopic Multiple Living   Review of Systems  Gastrointestinal: Positive for abdominal pain.  Genitourinary: Positive for dysuria, urgency, frequency and hematuria.  All other systems reviewed and are negative.     Allergies  Percocet and Ambien  Home Medications   Prior to Admission medications   Medication Sig Start Date End Date Taking? Authorizing Provider  cyclobenzaprine (FLEXERIL) 5 MG tablet Take 1 tablet (5 mg total) by mouth 3 (three) times daily as needed for muscle spasms. 02/15/15  Yes Arthor Captain, PA-C  FLUoxetine (PROZAC) 20 MG capsule Take 3 capsules (60 mg total) by mouth daily. 09/21/14  Yes Beau Fanny, FNP  fluticasone (FLOVENT HFA) 44 MCG/ACT inhaler Inhale 2 puffs into the lungs 2 (two) times daily as needed (for shortness of breath).    Yes Historical  Provider, MD  gabapentin (NEURONTIN) 300 MG capsule Take 1 capsule (300 mg total) by mouth 2 (two) times daily. Patient taking differently: Take 300 mg by mouth 3 (three) times daily.  09/21/14  Yes Beau Fanny, FNP  hydrOXYzine (ATARAX/VISTARIL) 50 MG tablet Take 1 tablet (50 mg total) by mouth 3 (three) times daily as needed for anxiety. 09/21/14  Yes Beau Fanny, FNP  lovastatin (MEVACOR) 20 MG tablet Take 1 tablet (20 mg total) by mouth at bedtime. 09/21/14  Yes Beau Fanny, FNP  mometasone (NASONEX) 50 MCG/ACT nasal spray Place 2 sprays into the nose as needed. For congestion    Yes Historical Provider, MD  mometasone-formoterol (DULERA) 200-5 MCG/ACT AERO Inhale 2 puffs into the lungs 2 (two) times daily as needed for wheezing.   Yes Historical Provider, MD  omeprazole (PRILOSEC) 20 MG capsule Take 1 capsule (20 mg total) by mouth daily. Patient taking differently: Take 20 mg by mouth as needed (for heartburn).  09/21/14  Yes Beau Fanny, FNP  traZODone (DESYREL) 50 MG tablet Take 1 tablet (50 mg total) by mouth at bedtime and may repeat dose one time if needed. Patient taking differently: Take 50 mg by mouth at bedtime.  09/21/14  Yes Beau Fanny, FNP  cephALEXin (KEFLEX) 500 MG capsule Take 1 capsule (500 mg total) by mouth 4 (four) times daily. 03/20/15   Jousha Schwandt, PA-C  insulin lispro protamine-lispro (HUMALOG 75/25 MIX) (75-25) 100 UNIT/ML SUSP injection Inject 65 Units into the skin 2 (two) times daily. 03/20/15   Wendle Kina, PA-C  lidocaine (LIDODERM) 5 % Place 1 patch onto the skin daily. Remove & Discard patch within 12 hours or as directed by MD Apply the patch to the Right side of the neck. 02/15/15   Arthor Captain, PA-C  lisinopril (PRINIVIL,ZESTRIL) 20 MG tablet Take 1 tablet (20 mg total) by mouth daily. 03/20/15   Amerie Beaumont, PA-C  meloxicam (MOBIC) 7.5 MG tablet Take 2 tablets (15 mg total) by mouth daily. 03/20/15   Kanaan Kagawa, PA-C  metFORMIN (GLUCOPHAGE-XR) 500 MG 24 hr tablet Take 4 tablets (2,000 mg total) by mouth daily with breakfast. 03/20/15   Francee Piccolo, PA-C  naproxen (NAPROSYN) 500 MG tablet Take 1 tablet (500 mg total) by mouth 2 (two) times daily. 02/15/15   Arthor Captain, PA-C  nystatin (MYCOSTATIN) 100000 UNIT/ML suspension Take 5 mLs (500,000 Units total) by mouth 4 (four) times daily. 12/17/14   Antavius Sperbeck, PA-C  ondansetron (ZOFRAN ODT) 4 MG disintegrating tablet Take 1 tablet (4 mg total) by mouth every 8 (eight) hours as needed for nausea or vomiting. 03/20/15   Francee Piccolo, PA-C  ondansetron (ZOFRAN) 4 MG tablet Take 1 tablet (4 mg total) by mouth every 8 (eight) hours as needed for nausea or vomiting. 09/21/14   Beau Fanny, FNP  phenazopyridine (PYRIDIUM) 95 MG tablet Take 1 tablet (95 mg total) by mouth 3 (three) times daily as needed for pain. 03/20/15   Jessejames Steelman, PA-C   BP 140/82 mmHg  Pulse 88  Temp(Src) 98.3 F (36.8 C) (Oral)  Resp 20  SpO2 100%  LMP 10/07/2011 Physical Exam  Constitutional: She is oriented to person, place, and time. She appears well-developed and well-nourished. No distress.  HENT:  Head: Normocephalic and atraumatic.  Right Ear: External ear normal.  Left Ear: External ear normal.  Nose: Nose normal.  Mouth/Throat: Oropharynx is clear and moist.  Eyes: Conjunctivae are normal.  Neck: Normal range  of motion. Neck supple.  No nuchal rigidity.   Cardiovascular: Normal rate, regular rhythm and normal heart sounds.   Pulmonary/Chest: Effort normal and breath sounds normal.  Abdominal: Soft. Bowel sounds are normal. There is no tenderness.  Musculoskeletal: Normal range of motion.  Neurological: She is alert and oriented to person, place, and time.  Skin: Skin is warm and dry. She is not diaphoretic.  Psychiatric: She has a normal mood and affect.  Nursing note and vitals reviewed.   ED Course  Procedures (including critical care time) Medications  sodium chloride 0.9 % bolus 1,000 mL (0 mLs Intravenous Stopped 03/20/15 1853)  ondansetron (ZOFRAN) injection 4 mg (4 mg Intravenous Given 03/20/15 1644)  morphine 4 MG/ML injection 4 mg (4 mg Intravenous Given 03/20/15 1645)  cefTRIAXone (ROCEPHIN) 1 g in dextrose 5 % 50 mL IVPB (0 g Intravenous Stopped 03/20/15 1854)  morphine 4 MG/ML injection 4 mg (4 mg Intravenous Given 03/20/15 1853)    Labs Review Labs Reviewed  URINALYSIS, ROUTINE W REFLEX MICROSCOPIC (NOT AT [facility]) - Abnormal; Notable for the following:    Color, Urine RED (*)    APPearance  TURBID (*)    Glucose, UA 500 (*)    Hgb urine dipstick LARGE (*)    Ketones, ur 15 (*)    Protein, ur 100 (*)    Leukocytes, UA LARGE (*)    All other components within normal limits  URINE MICROSCOPIC-ADD ON - Abnormal; Notable for the following:    Squamous Epithelial / LPF FEW (*)    Bacteria, UA FEW (*)    All other components within normal limits  CBC WITH DIFFERENTIAL/PLATELET - Abnormal; Notable for the following:    WBC 11.2 (*)    All other components within normal limits  BASIC METABOLIC PANEL - Abnormal; Notable for the following:    Sodium 134 (*)    Glucose, Bld 218 (*)    BUN <5 (*)    All other components within normal limits  URINE CULTURE    Imaging Review Ct Renal Stone Study  03/20/2015   CLINICAL DATA:  Left flank pain with dysuria and hematuria two days.  EXAM: CT ABDOMEN AND PELVIS WITHOUT CONTRAST  TECHNIQUE: Multidetector CT imaging of the abdomen and pelvis was performed following the standard protocol without IV contrast.  COMPARISON:  09/20/2014, 06/27/2013 and 11/13/2011  FINDINGS: Lung bases are normal.  Abdominal images demonstrate evidence of a previous cholecystectomy. The appendix is normal. Kidneys are normal in size without hydronephrosis or nephrolithiasis. Ureters are within normal. Small bowel and colon are within normal. No free fluid or focal inflammatory change noted.  Pelvic images demonstrate the bladder rectosigmoid colon to be within normal. Surgical absence of the uterus. Remaining bones and soft tissues are within normal.  IMPRESSION: No acute findings in the abdomen/pelvis.   Electronically Signed   By: Elberta Fortis M.D.   On: 03/20/2015 18:15     EKG Interpretation None      MDM   Final diagnoses:  Dysuria  UTI (lower urinary tract infection)    Filed Vitals:   03/20/15 1930  BP: 140/82  Pulse: 88  Temp:   Resp:    Afebrile, NAD, non-toxic appearing, AAOx4.   I have reviewed nursing notes, vital signs, and all  appropriate lab and imaging results if ordered as above.   Patient is nontoxic, nonseptic appearing, in no apparent distress.  Patient's pain and other symptoms adequately managed in emergency department.  Fluid bolus  given.  Labs, imaging and vitals reviewed.  Patient does not meet the SIRS or Sepsis criteria.  On repeat exam patient does not have a surgical abdomen and there are no peritoneal signs.  No indication of appendicitis, bowel obstruction, bowel perforation, cholecystitis, diverticulitis, nephrolithiasis or ectopic pregnancy. Will treat patient as outpatient for UTI. Patient discharged home with symptomatic treatment and given strict instructions for follow-up with their primary care physician.  I have also discussed reasons to return immediately to the ER.  Patient expresses understanding and agrees with plan.      Francee PiccoloJennifer Vonte Rossin, PA-C 03/20/15 2126  Pricilla LovelessScott Goldston, MD 03/27/15 0700

## 2015-03-20 NOTE — Discharge Instructions (Signed)
Please follow up with your primary care physician in 1-2 days. If you do not have one please call the [facility] number listed above.Please take your antibiotic until completion. Please read all discharge instructions and return precautions.  ° °Urinary Tract Infection °Urinary tract infections (UTIs) can develop anywhere along your urinary tract. Your urinary tract is your body's drainage system for removing wastes and extra water. Your urinary tract includes two kidneys, two ureters, a bladder, and a urethra. Your kidneys are a pair of bean-shaped organs. Each kidney is about the size of your fist. They are located below your ribs, one on each side of your spine. °CAUSES °Infections are caused by microbes, which are microscopic organisms, including fungi, viruses, and bacteria. These organisms are so small that they can only be seen through a microscope. Bacteria are the microbes that most commonly cause UTIs. °SYMPTOMS  °Symptoms of UTIs may vary by age and gender of the patient and by the location of the infection. Symptoms in young women typically include a frequent and intense urge to urinate and a painful, burning feeling in the bladder or urethra during urination. Older women and men are more likely to be tired, shaky, and weak and have muscle aches and abdominal pain. A fever may mean the infection is in your kidneys. Other symptoms of a kidney infection include pain in your back or sides below the ribs, nausea, and vomiting. °DIAGNOSIS °To diagnose a UTI, your caregiver will ask you about your symptoms. Your caregiver also will ask to provide a urine sample. The urine sample will be tested for bacteria and white blood cells. White blood cells are made by your body to help fight infection. °TREATMENT  °Typically, UTIs can be treated with medication. Because most UTIs are caused by a bacterial infection, they usually can be treated with the use of antibiotics. The choice of antibiotic  and length of treatment depend on your symptoms and the type of bacteria causing your infection. °HOME CARE INSTRUCTIONS °· If you were prescribed antibiotics, take them exactly as your caregiver instructs you. Finish the medication even if you feel better after you have only taken some of the medication. °· Drink enough water and fluids to keep your urine clear or pale yellow. °· Avoid caffeine, tea, and carbonated beverages. They tend to irritate your bladder. °· Empty your bladder often. Avoid holding urine for long periods of time. °· Empty your bladder before and after sexual intercourse. °· After a bowel movement, women should cleanse from front to back. Use each tissue only once. °SEEK MEDICAL CARE IF:  °· You have back pain. °· You develop a fever. °· Your symptoms do not begin to resolve within 3 days. °SEEK IMMEDIATE MEDICAL CARE IF:  °· You have severe back pain or lower abdominal pain. °· You develop chills. °· You have nausea or vomiting. °· You have continued burning or discomfort with urination. °MAKE SURE YOU:  °· Understand these instructions. °· Will watch your condition. °· Will get help right away if you are not doing well or get worse. °Document Released: 07/17/2005 Document Revised: 04/07/2012 Document Reviewed: 11/15/2011 °ExitCare® Patient Information ©2015 [employer]. This information is not intended to replace advice given to you by your health care provider. Make sure you discuss any questions you have with your health care provider. ° °

## 2015-03-20 NOTE — ED Notes (Signed)
Pt in c/o suprapubic abd pain and dysuria, states she noted blood in her urine today, no n/v/d

## 2015-03-22 LAB — URINE CULTURE: Colony Count: 50000

## 2015-03-23 ENCOUNTER — Telehealth (HOSPITAL_BASED_OUTPATIENT_CLINIC_OR_DEPARTMENT_OTHER): Payer: Self-pay | Admitting: Emergency Medicine

## 2015-03-23 NOTE — Telephone Encounter (Signed)
Post ED Visit - Positive Culture Follow-up  Culture report reviewed by antimicrobial stewardship pharmacist: []  Wes Dulaney, Pharm.D., BCPS []  Celedonio MiyamotoJeremy Frens, Pharm.D., BCPS [x]  Georgina PillionElizabeth Martin, Pharm.D., BCPS []  MarathonMinh Pham, [address]Ph[address] BCPS, AAHIVP []  Estella HuskMichelle Turner, Pharm.D., BCPS, AAHIVP []  Elder CyphersLorie Poole, [address]Ph[address]D., BCPS  Positive urine culture E. Coli Treated with cephalexin, organism sensitive to the same and no further patient follow-up is required at this time.  Berle MullMiller, Saniyah Mondesir 03/23/2015, 1:33 PM

## 2015-05-26 ENCOUNTER — Emergency Department (HOSPITAL_COMMUNITY)
Admission: EM | Admit: 2015-05-26 | Discharge: 2015-05-26 | Disposition: A | Discharge status: Home / Self Care | Payer: Medicaid Other | Attending: Emergency Medicine | Admitting: Emergency Medicine

## 2015-05-26 ENCOUNTER — Encounter (HOSPITAL_COMMUNITY): Payer: Self-pay | Admitting: *Deleted

## 2015-05-26 DIAGNOSIS — Z791 Long term (current) use of non-steroidal anti-inflammatories (NSAID): Secondary | ICD-10-CM | POA: Insufficient documentation

## 2015-05-26 DIAGNOSIS — E669 Obesity, unspecified: Secondary | ICD-10-CM | POA: Insufficient documentation

## 2015-05-26 DIAGNOSIS — G8929 Other chronic pain: Secondary | ICD-10-CM | POA: Insufficient documentation

## 2015-05-26 DIAGNOSIS — N760 Acute vaginitis: Secondary | ICD-10-CM | POA: Insufficient documentation

## 2015-05-26 DIAGNOSIS — Z862 Personal history of diseases of the blood and blood-forming organs and certain disorders involving the immune mechanism: Secondary | ICD-10-CM | POA: Insufficient documentation

## 2015-05-26 DIAGNOSIS — M1990 Unspecified osteoarthritis, unspecified site: Secondary | ICD-10-CM | POA: Insufficient documentation

## 2015-05-26 DIAGNOSIS — F419 Anxiety disorder, unspecified: Secondary | ICD-10-CM | POA: Insufficient documentation

## 2015-05-26 DIAGNOSIS — Z794 Long term (current) use of insulin: Secondary | ICD-10-CM | POA: Insufficient documentation

## 2015-05-26 DIAGNOSIS — G709 Myoneural disorder, unspecified: Secondary | ICD-10-CM | POA: Insufficient documentation

## 2015-05-26 DIAGNOSIS — Z87891 Personal history of nicotine dependence: Secondary | ICD-10-CM | POA: Insufficient documentation

## 2015-05-26 DIAGNOSIS — Z7951 Long term (current) use of inhaled steroids: Secondary | ICD-10-CM | POA: Insufficient documentation

## 2015-05-26 DIAGNOSIS — R102 Pelvic and perineal pain: Secondary | ICD-10-CM

## 2015-05-26 DIAGNOSIS — Z90710 Acquired absence of both cervix and uterus: Secondary | ICD-10-CM | POA: Insufficient documentation

## 2015-05-26 DIAGNOSIS — K219 Gastro-esophageal reflux disease without esophagitis: Secondary | ICD-10-CM | POA: Insufficient documentation

## 2015-05-26 DIAGNOSIS — B9689 Other specified bacterial agents as the cause of diseases classified elsewhere: Secondary | ICD-10-CM

## 2015-05-26 DIAGNOSIS — Z9049 Acquired absence of other specified parts of digestive tract: Secondary | ICD-10-CM | POA: Insufficient documentation

## 2015-05-26 DIAGNOSIS — F329 Major depressive disorder, single episode, unspecified: Secondary | ICD-10-CM | POA: Insufficient documentation

## 2015-05-26 DIAGNOSIS — M797 Fibromyalgia: Secondary | ICD-10-CM | POA: Insufficient documentation

## 2015-05-26 DIAGNOSIS — G629 Polyneuropathy, unspecified: Secondary | ICD-10-CM | POA: Insufficient documentation

## 2015-05-26 DIAGNOSIS — E785 Hyperlipidemia, unspecified: Secondary | ICD-10-CM | POA: Insufficient documentation

## 2015-05-26 DIAGNOSIS — Z79899 Other long term (current) drug therapy: Secondary | ICD-10-CM | POA: Insufficient documentation

## 2015-05-26 DIAGNOSIS — E119 Type 2 diabetes mellitus without complications: Secondary | ICD-10-CM | POA: Insufficient documentation

## 2015-05-26 DIAGNOSIS — I10 Essential (primary) hypertension: Secondary | ICD-10-CM | POA: Insufficient documentation

## 2015-05-26 DIAGNOSIS — Z792 Long term (current) use of antibiotics: Secondary | ICD-10-CM | POA: Insufficient documentation

## 2015-05-26 LAB — COMPREHENSIVE METABOLIC PANEL
ALBUMIN: 4.2 g/dL (ref 3.5–5.0)
ALT: 17 U/L (ref 14–54)
AST: 22 U/L (ref 15–41)
Alkaline Phosphatase: 103 U/L (ref 38–126)
Anion gap: 12 (ref 5–15)
BILIRUBIN TOTAL: 0.6 mg/dL (ref 0.3–1.2)
BUN: 6 mg/dL (ref 6–20)
CHLORIDE: 95 mmol/L — AB (ref 101–111)
CO2: 24 mmol/L (ref 22–32)
Calcium: 9.9 mg/dL (ref 8.9–10.3)
Creatinine, Ser: 0.8 mg/dL (ref 0.44–1.00)
GFR calc Af Amer: >60 mL/min (ref >60)
GFR calc non Af Amer: >60 mL/min (ref >60)
Glucose, Bld: 483 mg/dL — ABNORMAL HIGH (ref 65–99)
Potassium: 3.8 mmol/L (ref 3.5–5.1)
SODIUM: 131 mmol/L — AB (ref 135–145)
TOTAL PROTEIN: 8.1 g/dL (ref 6.5–8.1)

## 2015-05-26 LAB — URINALYSIS, ROUTINE W REFLEX MICROSCOPIC
BILIRUBIN URINE: NEGATIVE
Glucose, UA: >1000 mg/dL — AB
HGB URINE DIPSTICK: NEGATIVE
Ketones, ur: NEGATIVE mg/dL
LEUKOCYTES UA: NEGATIVE
NITRITE: NEGATIVE
Protein, ur: NEGATIVE mg/dL
Specific Gravity, Urine: 1.041 — ABNORMAL HIGH (ref 1.005–1.030)
UROBILINOGEN UA: 0.2 mg/dL (ref 0.0–1.0)
pH: 5.5 (ref 5.0–8.0)

## 2015-05-26 LAB — WET PREP, GENITAL
Trich, Wet Prep: NONE SEEN
YEAST WET PREP: NONE SEEN

## 2015-05-26 LAB — CBC
HCT: 45.1 % (ref 36.0–46.0)
Hemoglobin: 15.4 g/dL — ABNORMAL HIGH (ref 12.0–15.0)
MCH: 29 pg (ref 26.0–34.0)
MCHC: 34.1 g/dL (ref 30.0–36.0)
MCV: 84.9 fL (ref 78.0–100.0)
Platelets: 320 10*3/uL (ref 150–400)
RBC: 5.31 MIL/uL — AB (ref 3.87–5.11)
RDW: 12.6 % (ref 11.5–15.5)
WBC: 10.4 10*3/uL (ref 4.0–10.5)

## 2015-05-26 LAB — URINE MICROSCOPIC-ADD ON

## 2015-05-26 LAB — LIPASE, BLOOD: Lipase: 25 U/L (ref 22–51)

## 2015-05-26 LAB — CBG MONITORING, ED: Glucose-Capillary: 331 mg/dL — ABNORMAL HIGH (ref 65–99)

## 2015-05-26 MED ORDER — OXYCODONE-ACETAMINOPHEN 5-325 MG PO TABS
ORAL_TABLET | ORAL | Status: AC
  Filled 2015-05-26: qty 1

## 2015-05-26 MED ORDER — METRONIDAZOLE 500 MG PO TABS: 500.0000 mg | ORAL_TABLET | Freq: Two times a day (BID) | ORAL | Status: DC

## 2015-05-26 MED ORDER — LISINOPRIL 20 MG PO TABS: 20.0000 mg | ORAL_TABLET | Freq: Every day | ORAL | Status: DC

## 2015-05-26 MED ORDER — INSULIN LISPRO PROT & LISPRO (75-25 MIX) 100 UNIT/ML ~~LOC~~ SUSP: 65.0000 [IU] | Freq: Two times a day (BID) | SUBCUTANEOUS | Status: DC

## 2015-05-26 MED ORDER — SODIUM CHLORIDE 0.9 % IV BOLUS (SEPSIS)
1000.0000 mL | Freq: Once | INTRAVENOUS | Status: AC
  Administered 2015-05-26: 1000 mL via INTRAVENOUS

## 2015-05-26 MED ORDER — OXYCODONE-ACETAMINOPHEN 5-325 MG PO TABS: 1.0000 | ORAL_TABLET | Freq: Once | ORAL | Status: DC

## 2015-05-26 MED ORDER — METFORMIN HCL 500 MG PO TABS: 1000.0000 mg | ORAL_TABLET | Freq: Two times a day (BID) | ORAL | Status: DC

## 2015-05-26 MED ORDER — INSULIN ASPART 100 UNIT/ML ~~LOC~~ SOLN
8.0000 [IU] | Freq: Once | SUBCUTANEOUS | Status: AC
  Administered 2015-05-26: 8 [IU] via INTRAVENOUS
  Filled 2015-05-26: qty 1

## 2015-05-26 MED ORDER — FENTANYL CITRATE (PF) 100 MCG/2ML IJ SOLN
INTRAMUSCULAR | Status: AC
  Filled 2015-05-26: qty 2

## 2015-05-26 MED ORDER — FENTANYL CITRATE (PF) 100 MCG/2ML IJ SOLN
50.0000 ug | Freq: Once | INTRAMUSCULAR | Status: AC
  Administered 2015-05-26: 50 ug via NASAL

## 2015-05-26 NOTE — ED Notes (Signed)
Pt reports onset today of left side lower abd pain since 12:00. Denies n/v/d or urinary/vaginal symptoms. Pt tearful at triage.

## 2015-05-26 NOTE — Discharge Instructions (Signed)
Bacterial Vaginosis Take metronidazole as prescribed. Do not drink alcohol while using this medication. Keep your follow-up appointment with [facility] on Tuesday. Bacterial vaginosis is a vaginal infection that occurs when the normal balance of bacteria in the vagina is disrupted. It results from an overgrowth of certain bacteria. This is the most common vaginal infection in women of childbearing age. Treatment is important to prevent complications, especially in pregnant women, as it can cause a premature delivery. CAUSES  Bacterial vaginosis is caused by an increase in harmful bacteria that are normally present in smaller amounts in the vagina. Several different kinds of bacteria can cause bacterial vaginosis. However, the reason that the condition develops is not fully understood. RISK FACTORS Certain activities or behaviors can put you at an increased risk of developing bacterial vaginosis, including:  Having a new sex partner or multiple sex partners.  Douching.  Using an intrauterine device (IUD) for contraception. Women do not get bacterial vaginosis from toilet seats, bedding, swimming pools, or contact with objects around them. SIGNS AND SYMPTOMS  Some women with bacterial vaginosis have no signs or symptoms. Common symptoms include:  Grey vaginal discharge.  A fishlike odor with discharge, especially after sexual intercourse.  Itching or burning of the vagina and vulva.  Burning or pain with urination. DIAGNOSIS  Your health care provider will take a medical history and examine the vagina for signs of bacterial vaginosis. A sample of vaginal fluid may be taken. Your health care provider will look at this sample under a microscope to check for bacteria and abnormal cells. A vaginal pH test may also be done.  TREATMENT  Bacterial vaginosis may be treated with antibiotic medicines. These may be given in the form of a pill or a vaginal cream. A second round of  antibiotics may be prescribed if the condition comes back after treatment.  HOME CARE INSTRUCTIONS   Only take over-the-counter or prescription medicines as directed by your health care provider.  If antibiotic medicine was prescribed, take it as directed. Make sure you finish it even if you start to feel better.  Do not have sex until treatment is completed.  Tell all sexual partners that you have a vaginal infection. They should see their health care provider and be treated if they have problems, such as a mild rash or itching.  Practice safe sex by using condoms and only having one sex partner. SEEK MEDICAL CARE IF:   Your symptoms are not improving after 3 days of treatment.  You have increased discharge or pain.  You have a fever. MAKE SURE YOU:   Understand these instructions.  Will watch your condition.  Will get help right away if you are not doing well or get worse. FOR MORE INFORMATION  Centers for Disease Control and Prevention, Division of STD Prevention: SolutionApps.[employer].za American Sexual Health Association (ASHA): www.ashastd.org  Document Released: 10/07/2005 Document Revised: 07/28/2013 Document Reviewed: 05/19/2013 [facility] Patient Information 2015 [place], [place]. This information is not intended to replace advice given to you by your health care provider. Make sure you discuss any questions you have with your health care provider.

## 2015-05-26 NOTE — ED Provider Notes (Signed)
CSN: [id number]     Arrival date & time 05/26/15  1544 History  This chart was scribed for non-physician practitioner, Catha Gosselin, PA-C working with Laurence Spates, MD by Gwenyth Ober, ED scribe. This patient was seen in room TR02C/TR02C and the patient's care was started at 5:17 PM  Chief Complaint  Patient presents with  . Abdominal Pain   The history is provided by the patient. No language interpreter was used.   HPI Comments: Vanessa Case is a 44 y.o. female with a history of anemia, DM, HTN, hyperlipidemia, substance abuse, cholecystectomy, hysterectomy sparing the left ovary, chronic HA and chronic pain who presents to the Emergency Department complaining of constant, moderate left-sided suprapubic pain that started 5 hours ago after she urinated. She states nausea as an associated symptom. Pt has not tried any treatment for her symptoms PTA. Pt does not take medicine for her DM because of financial difficulties. Her last BM was this morning. Pt's last pap smear was 2 years ago. Pt denies dysuria, hematuria, vaginal discharge, vaginal bleeding, fever, vomiting and constipation as associated symptoms.  Patient states she had hysterectomy with right oophorectomy.   Past Medical History  Diagnosis Date  . Anemia   . GERD (gastroesophageal reflux disease)   . Depression   . Anxiety   . 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 Hypertension     maintained on lisinopril hct, metoprolol-134/86 at pat visit  . Neuropathy   . Arthritis   . Hyperlipidemia   . Neuromuscular disorder   . Substance abuse   . Chronic headache   . Fibromyalgia   . Obesity   . Chronic neck pain   . Chronic back pain   . Chronic abdominal pain    Past Surgical History  Procedure Laterality Date  . Cesarean section  x3  . Abdominal hysterectomy  10/30/2011    Procedure: HYSTERECTOMY ABDOMINAL;  Surgeon: Bing Plume, MD;  Location: [facility];  Service: Gynecology;  Laterality: N/A;  . Salpingoophorectomy  10/30/2011    Procedure: SALPINGO OOPHERECTOMY;  Surgeon: Bing Plume, MD;  Location: [facility];  Service: Gynecology;  Laterality: Right;  . Cholecystectomy N/A 06/28/2013    Procedure: LAPAROSCOPIC CHOLECYSTECTOMY;  Surgeon: Shelly Rubenstein, MD;  Location: [facility];  Service: General;  Laterality: N/A;  . Wrist surgery      carpel tunnel and tendonitis   Family History  Problem Relation Age of Onset  . Cystic fibrosis Father   . Diabetes Mother   . Diabetes Maternal Grandmother   . Heart disease Maternal Grandfather    History  Substance Use Topics  . Smoking status: Former Smoker    Quit date: 10/24/1997  . Smokeless tobacco: Never Used  . Alcohol Use: No   OB History    Gravida Para Term Preterm AB TAB SAB Ectopic Multiple Living   3 3 3       3      Review of Systems  Constitutional: Negative for fever.  Gastrointestinal: Positive for nausea and abdominal pain. Negative for vomiting and constipation.  Genitourinary: Positive for frequency. Negative for dysuria, hematuria, vaginal bleeding and vaginal discharge.  All other systems reviewed and are negative.  Allergies  Percocet and Ambien  Home Medications   Prior to Admission medications   Medication Sig Start Date End Date Taking? Authorizing Provider  cephALEXin (KEFLEX) 500 MG capsule Take 1 capsule (  500 mg total) by mouth 4 (four) times daily. 03/20/15   Jennifer Piepenbrink, PA-C  cyclobenzaprine (FLEXERIL) 5 MG tablet Take 1 tablet (5 mg total) by mouth 3 (three) times daily as needed for muscle spasms. 02/15/15   Arthor Captain, PA-C  FLUoxetine (PROZAC) 20 MG capsule Take 3 capsules (60 mg total) by mouth daily. 09/21/14   Beau Fanny, FNP  fluticasone (FLOVENT HFA) 44 MCG/ACT inhaler Inhale 2 puffs into the lungs 2 (two) times daily as needed (for shortness of breath).     Historical Provider, MD  gabapentin (NEURONTIN)  300 MG capsule Take 1 capsule (300 mg total) by mouth 2 (two) times daily. Patient taking differently: Take 300 mg by mouth 3 (three) times daily.  09/21/14   Beau Fanny, FNP  hydrOXYzine (ATARAX/VISTARIL) 50 MG tablet Take 1 tablet (50 mg total) by mouth 3 (three) times daily as needed for anxiety. 09/21/14   Beau Fanny, FNP  insulin lispro protamine-lispro (HUMALOG 75/25 MIX) (75-25) 100 UNIT/ML SUSP injection Inject 65 Units into the skin 2 (two) times daily. 03/20/15   Jennifer Piepenbrink, PA-C  insulin lispro protamine-lispro (HUMALOG 75/25 MIX) (75-25) 100 UNIT/ML SUSP injection Inject 65 Units into the skin 2 (two) times daily with a meal. 05/26/15   Unnamed Zeien Patel-Mills, PA-C  lidocaine (LIDODERM) 5 % Place 1 patch onto the skin daily. Remove & Discard patch within 12 hours or as directed by MD Apply the patch to the Right side of the neck. 02/15/15   Arthor Captain, PA-C  lisinopril (PRINIVIL,ZESTRIL) 20 MG tablet Take 1 tablet (20 mg total) by mouth daily. 05/26/15   Yarisbel Miranda Patel-Mills, PA-C  lovastatin (MEVACOR) 20 MG tablet Take 1 tablet (20 mg total) by mouth at bedtime. 09/21/14   Beau Fanny, FNP  meloxicam (MOBIC) 7.5 MG tablet Take 2 tablets (15 mg total) by mouth daily. 03/20/15   Jennifer Piepenbrink, PA-C  metFORMIN (GLUCOPHAGE) 500 MG tablet Take 2 tablets (1,000 mg total) by mouth 2 (two) times daily with a meal. 05/26/15   Neamiah Sciarra Patel-Mills, PA-C  metroNIDAZOLE (FLAGYL) 500 MG tablet Take 1 tablet (500 mg total) by mouth 2 (two) times daily. 05/26/15   Rickesha Veracruz Patel-Mills, PA-C  mometasone (NASONEX) 50 MCG/ACT nasal spray Place 2 sprays into the nose as needed. For congestion    Historical Provider, MD  mometasone-formoterol (DULERA) 200-5 MCG/ACT AERO Inhale 2 puffs into the lungs 2 (two) times daily as needed for wheezing.    Historical Provider, MD  naproxen (NAPROSYN) 500 MG tablet Take 1 tablet (500 mg total) by mouth 2 (two) times daily. 02/15/15   Arthor Captain, PA-C  nystatin  (MYCOSTATIN) 100000 UNIT/ML suspension Take 5 mLs (500,000 Units total) by mouth 4 (four) times daily. 12/17/14   Jennifer Piepenbrink, PA-C  omeprazole (PRILOSEC) 20 MG capsule Take 1 capsule (20 mg total) by mouth daily. Patient taking differently: Take 20 mg by mouth as needed (for heartburn).  09/21/14   Beau Fanny, FNP  ondansetron (ZOFRAN ODT) 4 MG disintegrating tablet Take 1 tablet (4 mg total) by mouth every 8 (eight) hours as needed for nausea or vomiting. 03/20/15   Francee Piccolo, PA-C  ondansetron (ZOFRAN) 4 MG tablet Take 1 tablet (4 mg total) by mouth every 8 (eight) hours as needed for nausea or vomiting. 09/21/14   Beau Fanny, FNP  phenazopyridine (PYRIDIUM) 95 MG tablet Take 1 tablet (95 mg total) by mouth 3 (three) times daily as needed for pain. 03/20/15  Jennifer Piepenbrink, PA-C  traZODone (DESYREL) 50 MG tablet Take 1 tablet (50 mg total) by mouth at bedtime and may repeat dose one time if needed. Patient taking differently: Take 50 mg by mouth at bedtime.  09/21/14   Everardo All Withrow, FNP   BP 154/102 mmHg  Pulse 115  Temp(Src) 98.1 F (36.7 C) (Oral)  Resp 20  SpO2 95%  LMP 10/07/2011 Physical Exam  Constitutional: She is oriented to person, place, and time. She appears well-developed and well-nourished. No distress.  HENT:  Head: Normocephalic and atraumatic.  Eyes: Conjunctivae and EOM are normal.  Neck: Neck supple. No tracheal deviation present.  Cardiovascular: Normal rate, regular rhythm and normal heart sounds.   Pulmonary/Chest: Effort normal and breath sounds normal. No respiratory distress. She has no wheezes. She has no rales.  Abdominal: Soft. She exhibits no distension and no mass. There is tenderness. There is no rebound, no guarding and no CVA tenderness.  Obese; mild left pelvic TTP  Genitourinary:  Pelvic Exam: Chaperone present. Mild left adnexal tenderness Cervical os was closed No vaginal bleeding; moderate amount of white,  nonodorous, clumpy discharge No lesions or warts noted on the labia.  Musculoskeletal: Normal range of motion.  Neurological: She is alert and oriented to person, place, and time.  Skin: Skin is warm and dry.  Psychiatric: She has a normal mood and affect. Her behavior is normal.  Nursing note and vitals reviewed.   ED Course  Procedures   DIAGNOSTIC STUDIES: Oxygen Saturation is 95% on RA, normal by my interpretation.    COORDINATION OF CARE: 5:26 PM Discussed treatment plan with pt which includes a pelvic exam and IV fluids. Pt agreed to plan.   Labs Review Labs Reviewed  WET PREP, GENITAL - Abnormal; Notable for the following:    Clue Cells Wet Prep HPF POC FEW (*)    WBC, Wet Prep HPF POC FEW (*)    All other components within normal limits  COMPREHENSIVE METABOLIC PANEL - Abnormal; Notable for the following:    Sodium 131 (*)    Chloride 95 (*)    Glucose, Bld 483 (*)    All other components within normal limits  CBC - Abnormal; Notable for the following:    RBC 5.31 (*)    Hemoglobin 15.4 (*)    All other components within normal limits  URINALYSIS, ROUTINE W REFLEX MICROSCOPIC (NOT AT [facility]) - Abnormal; Notable for the following:    Specific Gravity, Urine 1.041 (*)    Glucose, UA >1000 (*)    All other components within normal limits  CBG MONITORING, ED - Abnormal; Notable for the following:    Glucose-Capillary 331 (*)    All other components within normal limits  LIPASE, BLOOD  URINE MICROSCOPIC-ADD ON  GC/CHLAMYDIA PROBE AMP ([place]) NOT AT [facility]    Imaging Review No results found.   EKG Interpretation None      MDM  Patient presented for left pelvic tenderness. Her glucose was 483 but she was not in DKA. She had clue cells on wet prep and was treated for BV with Metronidazole. Otherwise, her labs and exam were not concerning. I consulted case management since patient stated she has not taken her medication since December 2015 due to financial  reasons. Pt able to get BP, and diabetes medications and has appointment  in 5 days with [facility]. I discussed return precautions and patient verbally agreed with the plan. Medications  fentaNYL (SUBLIMAZE) injection  50 mcg (50 mcg Nasal Given 05/26/15 1616)  sodium chloride 0.9 % bolus 1,000 mL (0 mLs Intravenous Stopped 05/26/15 2001)  insulin aspart (novoLOG) injection 8 Units (8 Units Intravenous Given 05/26/15 1755)   Final diagnoses:  Bacterial vaginosis  Pelvic pain in female  I personally performed the services described in this documentation, which was scribed in my presence. The recorded information has been reviewed and is accurate.    Catha Gosselin, PA-C 05/27/15 0228  Laurence Spates, MD 05/29/15 [phone]

## 2015-05-26 NOTE — Care Management Note (Signed)
Case Management Note  Patient Details  Name: Vanessa Case MRN: [id number] Date of Birth: 04/04/1971  Subjective/Objective:    Patient presented to [facility] ED with elevated blood sugars over 400, patient states she has not had her insulin since Dec. 2015. Patient states, she has not been able to afford since lossing her job. She applied for Medicaid, and was awarded [facility] planning Medicaid benefits which doesn't cover medications            Action/Plan: MATCH  Medication Assistance Establish Care with [facility] for follow up  Expected Discharge Date:      05/26/2015            Expected Discharge Plan:     MATCH Medication assistance Referral to the [facility]  In-House Referral:     Discharge planning Services     Post Acute Care Choice:    Choice offered to:     DME Arranged:    DME Agency:     HH Arranged:    HH Agency:     Status of Service:   completed  Medicare Important Message Given:    Date Medicare IM Given:    Medicare IM give by:    Date Additional Medicare IM Given:    Additional Medicare Important Message give by:     If discussed at Long Length of Stay Meetings, dates discussed:    Additional Comments:   Discussed MATCH and guidelines with patient, including $3 co-pay per prescription. Also discussed the [facility] and the services rendered there, including  the [employer]. Appt scheduled for 8/12 at 12n, Patient verbalizes understanding and is agreeable with disposition plan. Updated Hana Patel-Mills PA-C she agrees with discharge plan. No further ED CM needs identified Michel Bickers, RN 05/26/2015, 6:28 PM

## 2015-05-29 LAB — GC/CHLAMYDIA PROBE AMP (~~LOC~~) NOT AT ARMC
CHLAMYDIA, DNA PROBE: NEGATIVE
NEISSERIA GONORRHEA: NEGATIVE

## 2015-05-30 ENCOUNTER — Encounter: Payer: Self-pay | Admitting: Internal Medicine

## 2015-05-30 ENCOUNTER — Ambulatory Visit: Payer: Self-pay | Attending: Internal Medicine | Admitting: Internal Medicine

## 2015-05-30 VITALS — BP 136/84 | HR 96 | Temp 99.4°F | Resp 18 | Ht 65.0 in | Wt 189.0 lb

## 2015-05-30 DIAGNOSIS — R739 Hyperglycemia, unspecified: Secondary | ICD-10-CM

## 2015-05-30 DIAGNOSIS — E785 Hyperlipidemia, unspecified: Secondary | ICD-10-CM

## 2015-05-30 DIAGNOSIS — R5383 Other fatigue: Secondary | ICD-10-CM | POA: Insufficient documentation

## 2015-05-30 DIAGNOSIS — Z23 Encounter for immunization: Secondary | ICD-10-CM

## 2015-05-30 DIAGNOSIS — I10 Essential (primary) hypertension: Secondary | ICD-10-CM

## 2015-05-30 DIAGNOSIS — E1142 Type 2 diabetes mellitus with diabetic polyneuropathy: Secondary | ICD-10-CM | POA: Insufficient documentation

## 2015-05-30 HISTORY — DX: Hyperlipidemia, unspecified: E78.5

## 2015-05-30 LAB — POCT GLYCOSYLATED HEMOGLOBIN (HGB A1C)

## 2015-05-30 LAB — GLUCOSE, POCT (MANUAL RESULT ENTRY)
POC GLUCOSE: 275 mg/dL — AB (ref 70–99)
POC GLUCOSE: 232 mg/dL — AB (ref 70–99)

## 2015-05-30 MED ORDER — INSULIN ASPART 100 UNIT/ML ~~LOC~~ SOLN
10.0000 [IU] | Freq: Once | SUBCUTANEOUS | Status: AC
  Administered 2015-05-30: 10 [IU] via SUBCUTANEOUS

## 2015-05-30 MED ORDER — TRUEPLUS LANCETS 28G MISC: Status: DC

## 2015-05-30 MED ORDER — LOVASTATIN 20 MG PO TABS: 20.0000 mg | ORAL_TABLET | Freq: Every day | ORAL | Status: DC

## 2015-05-30 MED ORDER — GABAPENTIN 300 MG PO CAPS: 300.0000 mg | ORAL_CAPSULE | Freq: Three times a day (TID) | ORAL | Status: DC

## 2015-05-30 MED ORDER — "INSULIN SYRINGE-NEEDLE U-100 31G X 15/64"" 1 ML MISC": Status: DC

## 2015-05-30 MED ORDER — GLUCOSE BLOOD VI STRP: ORAL_STRIP | Status: DC

## 2015-05-30 MED ORDER — TRUE METRIX METER W/DEVICE KIT: PACK | Status: DC

## 2015-05-30 MED ORDER — METFORMIN HCL 500 MG PO TABS: 1000.0000 mg | ORAL_TABLET | Freq: Two times a day (BID) | ORAL | Status: DC

## 2015-05-30 MED ORDER — FLUTICASONE PROPIONATE HFA 44 MCG/ACT IN AERO: 2.0000 | INHALATION_SPRAY | Freq: Two times a day (BID) | RESPIRATORY_TRACT | Status: DC | PRN

## 2015-05-30 MED ORDER — OMEPRAZOLE 20 MG PO CPDR: 20.0000 mg | DELAYED_RELEASE_CAPSULE | Freq: Every day | ORAL | Status: DC

## 2015-05-30 MED ORDER — LISINOPRIL 20 MG PO TABS: 20.0000 mg | ORAL_TABLET | Freq: Every day | ORAL | Status: DC

## 2015-05-30 NOTE — Progress Notes (Signed)
Patient here to establish care and for ED follow up for pelvic pain. Patient denies any pelvic pain since she has been out of the ED. Patient reports generalized pain today. Most of her pain is located in her hands in her thumbs. Pain more severe in left hand which she had surgery on due to carpel tunnel. Pain rated at a 7, described as throbbing. Pain is constant and has been present since January. Patient CBG is 275. Patient reports she has been out of her medications since December, but received some rx from the ED. Patient was given humalog rx but has not been able to take it due to not having syringes. Patient has only been taking metformin for her diabetes. Patient needs refills on flovent inhaler, dulera inhaler, lovastatin, omeprazole, zofran, syringes, and glucometer with supplies.

## 2015-05-30 NOTE — Progress Notes (Signed)
Patient ID: Imagine Nest, female   DOB: 06/30/1971, 44 y.o.   MRN: [id number]   ZJI:[id number]  [id number]:[id number]  DOB - 11/24/1970  HPI: Vanessa Case is a 44 y.o. female here today to establish medical care. Patient has a past medical history of T2DM, HTN, neuropathy, HLD, Bipolar disorder, PTSD, and recovering drug addict clean since 1991. Patient reports that she was seen in the ER on 8/5 for pelvic pain that has completely resolved. Patient reports that she has been out of several of her medications for 8 months due to not having insurance. She states that she has been to the ER several times for medication refills due to be uninsured.  She has been through diabetes education in past. Reports that her diabetes has always been controlled when she had insurance.  She has been on Humalog 70/30 65 units twice daily and Metformin. She states that she will not take pain medication because she is afraid of addiction from her drug history but has generalized pain. Her most significant pain in in her feet from neuropathy in which she has been on gabapentin and lyrica in the past with little relief.  She states that she has received injections in the past for hip and lower back pain that really helped.    Allergies  Allergen Reactions  . Percocet [Oxycodone-Acetaminophen] Hives, Itching, Palpitations and Other (See Comments)    nightmares  . Ambien [Zolpidem] Other (See Comments)    Sleepwalking episodes   Past Medical History  Diagnosis Date  . Anemia   . GERD (gastroesophageal reflux disease)   . Depression   . Anxiety   . 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 Hypertension     maintained on lisinopril hct, metoprolol-134/86 at pat visit  . Neuropathy   . Arthritis   . Hyperlipidemia   . Neuromuscular disorder   . Substance abuse   . Chronic headache   . Fibromyalgia   . Obesity   . Chronic neck pain     . Chronic back pain   . Chronic abdominal pain    Current Outpatient Prescriptions on File Prior to Visit  Medication Sig Dispense Refill  . cyclobenzaprine (FLEXERIL) 5 MG tablet Take 1 tablet (5 mg total) by mouth 3 (three) times daily as needed for muscle spasms. 30 tablet 0  . FLUoxetine (PROZAC) 20 MG capsule Take 3 capsules (60 mg total) by mouth daily. 42 capsule 0  . fluticasone (FLOVENT HFA) 44 MCG/ACT inhaler Inhale 2 puffs into the lungs 2 (two) times daily as needed (for shortness of breath).     . gabapentin (NEURONTIN) 300 MG capsule Take 1 capsule (300 mg total) by mouth 2 (two) times daily. (Patient taking differently: Take 300 mg by mouth 3 (three) times daily. ) 28 capsule 0  . hydrOXYzine (ATARAX/VISTARIL) 50 MG tablet Take 1 tablet (50 mg total) by mouth 3 (three) times daily as needed for anxiety. 21 tablet 0  . insulin lispro protamine-lispro (HUMALOG 75/25 MIX) (75-25) 100 UNIT/ML SUSP injection Inject 65 Units into the skin 2 (two) times daily. 10 mL 0  . insulin lispro protamine-lispro (HUMALOG 75/25 MIX) (75-25) 100 UNIT/ML SUSP injection Inject 65 Units into the skin 2 (two) times daily with a meal. 100 mL 11  . lidocaine (LIDODERM) 5 % Place 1 patch onto the skin daily. Remove & Discard patch within 12 hours or as directed by MD  Apply the patch to the Right side of the neck. 30 patch 0  . lisinopril (PRINIVIL,ZESTRIL) 20 MG tablet Take 1 tablet (20 mg total) by mouth daily. 30 tablet 0  . lovastatin (MEVACOR) 20 MG tablet Take 1 tablet (20 mg total) by mouth at bedtime. 14 tablet 0  . meloxicam (MOBIC) 7.5 MG tablet Take 2 tablets (15 mg total) by mouth daily. 30 tablet 0  . metFORMIN (GLUCOPHAGE) 500 MG tablet Take 2 tablets (1,000 mg total) by mouth 2 (two) times daily with a meal. 60 tablet 0  . metroNIDAZOLE (FLAGYL) 500 MG tablet Take 1 tablet (500 mg total) by mouth 2 (two) times daily. 14 tablet 0  . mometasone (NASONEX) 50 MCG/ACT nasal spray Place 2 sprays  into the nose as needed. For congestion    . mometasone-formoterol (DULERA) 200-5 MCG/ACT AERO Inhale 2 puffs into the lungs 2 (two) times daily as needed for wheezing.    . naproxen (NAPROSYN) 500 MG tablet Take 1 tablet (500 mg total) by mouth 2 (two) times daily. 30 tablet 0  . nystatin (MYCOSTATIN) 100000 UNIT/ML suspension Take 5 mLs (500,000 Units total) by mouth 4 (four) times daily. 60 mL 0  . omeprazole (PRILOSEC) 20 MG capsule Take 1 capsule (20 mg total) by mouth daily. (Patient taking differently: Take 20 mg by mouth as needed (for heartburn). ) 30 capsule 0  . ondansetron (ZOFRAN ODT) 4 MG disintegrating tablet Take 1 tablet (4 mg total) by mouth every 8 (eight) hours as needed for nausea or vomiting. 20 tablet 0  . ondansetron (ZOFRAN) 4 MG tablet Take 1 tablet (4 mg total) by mouth every 8 (eight) hours as needed for nausea or vomiting. 28 tablet 0  . phenazopyridine (PYRIDIUM) 95 MG tablet Take 1 tablet (95 mg total) by mouth 3 (three) times daily as needed for pain. 10 tablet 0  . traZODone (DESYREL) 50 MG tablet Take 1 tablet (50 mg total) by mouth at bedtime and may repeat dose one time if needed. (Patient taking differently: Take 50 mg by mouth at bedtime. ) 14 tablet 0   No current facility-administered medications on file prior to visit.   Family History  Problem Relation Age of Onset  . Cystic fibrosis Father   . Diabetes Mother   . Diabetes Maternal Grandmother   . Heart disease Maternal Grandfather    History   Social History  . Marital Status: Married    Spouse Name: N/A  . Number of Children: N/A  . Years of Education: N/A   Occupational History  . Not on file.   Social History Main Topics  . Smoking status: Former Smoker    Quit date: 10/24/1997  . Smokeless tobacco: Never Used  . Alcohol Use: No  . Drug Use: No     Comment: history of 04/1998-cocaine, heroin, marijuana  . Sexual Activity: Not on file   Other Topics Concern  . Not on file   Social  History Narrative    Review of Systems  Constitutional: Positive for malaise/fatigue. Negative for weight loss.  Eyes: Positive for blurred vision.  Genitourinary: Positive for frequency.  Musculoskeletal: Positive for back pain and joint pain.  Neurological: Positive for tingling and weakness.  Endo/Heme/Allergies: Positive for polydipsia.  Psychiatric/Behavioral: Positive for depression (Bipolar, PTSD). Negative for substance abuse (clean since 1991). The patient is nervous/anxious.   All other systems reviewed and are negative.  Objective:   Filed Vitals:   05/30/15 1742  BP: 136/84  Pulse: 96  Temp: 99.4 F (37.4 C)  Resp: 18    Physical Exam: Constitutional: Patient appears well-developed and well-nourished. No distress. HENT: Normocephalic, atraumatic, External right and left ear normal. Oropharynx is clear and moist.  Eyes: Conjunctivae and EOM are normal. PERRLA, no scleral icterus. Neck: Normal ROM. Neck supple. No JVD. No tracheal deviation. No thyromegaly. CVS: RRR, S1/S2 +, no murmurs, no gallops, no carotid bruit.  Pulmonary: Effort and breath sounds normal, no stridor, rhonchi, wheezes, rales.  Abdominal: Soft. BS +, no distension, tenderness, rebound or guarding.  Musculoskeletal: Normal range of motion. No edema and no tenderness.  Lymphadenopathy: No lymphadenopathy noted, cervical, inguinal or axillary Neuro: Alert. Normal reflexes, muscle tone coordination. No cranial nerve deficit. Skin: Skin is warm and dry. No rash noted. Not diaphoretic. No erythema. No pallor. Psychiatric: Normal mood and affect. Behavior, judgment, thought content normal.  Lab Results  Component Value Date   WBC 10.4 05/26/2015   HGB 15.4* 05/26/2015   HCT 45.1 05/26/2015   MCV 84.9 05/26/2015   PLT 320 05/26/2015   Lab Results  Component Value Date   CREATININE 0.80 05/26/2015   BUN 6 05/26/2015   NA 131* 05/26/2015   K 3.8 05/26/2015   CL 95* 05/26/2015   CO2 24  05/26/2015    Lab Results  Component Value Date   HGBA1C >15.0 05/30/2015   Lipid Panel     Component Value Date/Time   CHOL 209* 03/15/2011 1910   TRIG 226* 03/15/2011 1910   HDL 39* 03/15/2011 1910   CHOLHDL 5.4 03/15/2011 1910   VLDL 45* 03/15/2011 1910   LDLCALC * 03/15/2011 1910    125        Total Cholesterol/HDL:CHD Risk Coronary Heart Disease Risk Table                     Men   Women  1/2 Average Risk   3.4   3.3  Average Risk       5.0   4.4  2 X Average Risk   9.6   7.1  3 X Average Risk  23.4   11.0        Use the calculated Patient Ratio above and the CHD Risk Table to determine the patient's CHD Risk.        ATP III CLASSIFICATION (LDL):  <100     mg/dL   Optimal  100-129  mg/dL   Near or Above                    Optimal  130-159  mg/dL   Borderline  160-189  mg/dL   High  >190     mg/dL   Very High       Assessment and plan:   Vanessa Case was seen today for establish care and follow-up.  Diagnoses and all orders for this visit:  Type 2 diabetes mellitus without complication Orders: -     HgB A1c -     Glucose (CBG) -     Blood Glucose Monitoring Suppl (TRUE METRIX METER) W/DEVICE KIT; Check sugars 3 times per day -     glucose blood (TRUE METRIX BLOOD GLUCOSE TEST) test strip; Use as instructed -     TRUEPLUS LANCETS 28G MISC; Check sugars 3 times per day -     Microalbumin, urine -     Begin metFORMIN (GLUCOPHAGE) 500 MG tablet; Take 2 tablets (1,000 mg total) by mouth 2 (two) times  daily with a meal. -     POCT glucose (manual entry) -     Insulin Syringe-Needle U-100 (BD INSULIN SYRINGE ULTRAFINE) 31G X 15/64" 1 ML MISC; Give insulin twice daily Patient currently taking Humalog 75/25 given to her by ER. [facility] pharmacy does not carry this insulin. She does not have a blood sugar monitor so she will not take insulin tonight, but will come back in the morning when pharmacy opens so that she may pick up glucometer and begin insulin in the AM. I will let  patient use the last vial of Humalog 75/25 and I will likely switch her. I will have her come back in 1 week to assess how current insulin is working. If her numbers are acceptable then we will go to 70/30, if not I will switch to Lantus.  Hyperglycemia Orders: -     insulin aspart (novoLOG) injection 10 Units; Inject 0.1 mLs (10 Units total) into the skin once in office.   Essential hypertension Orders: -     Refill lisinopril (PRINIVIL,ZESTRIL) 20 MG tablet; Take 1 tablet (20 mg total) by mouth daily. Will check BP at nurse visit next week.   HLD (hyperlipidemia) Orders: -     Refill lovastatin (MEVACOR) 20 MG tablet; Take 1 tablet (20 mg total) by mouth daily at 6 PM. -     Lipid panel; Future I will recheck labs tomorrow. Went over carbs/low fat diet. Education provided on proper lifestyle changes in order to lower cholesterol. Patient advised to maintain healthy weight and to keep total fat intake at 25-35% of total calories and carbohydrates 50-60% of total daily calories. Explained how high cholesterol places patient at risk for heart disease. Patient placed on appropriate medication and repeat labs in 6 months   Diabetic polyneuropathy associated with type 2 diabetes mellitus I will keep patient on Gabapentin 300 mg twice daily and 600 mg at bedtime for better control of nighttime symptoms. Patient reports a previous surgery on left thumb for tendinitis and carpal tunnel in past.   Other fatigue Orders: -     TSH; Future -     Vitamin D, 25-hydroxy; Future Will check thyroid and vitamin D to see if that may be a contributing factor.   Need for Tdap vaccination Orders: -     Tdap vaccine greater than or equal to 7yo IM   Return in about 1 day (around 05/31/2015) for Lab visit and 1 week RN-log review/BP check and 3 mo PCP Diabetes f/u    Lance Bosch, [facility] [phone] 05/30/2015, 5:26 PM

## 2015-05-31 ENCOUNTER — Ambulatory Visit: Payer: Medicaid Other | Attending: Internal Medicine

## 2015-05-31 DIAGNOSIS — R5383 Other fatigue: Secondary | ICD-10-CM

## 2015-05-31 DIAGNOSIS — E785 Hyperlipidemia, unspecified: Secondary | ICD-10-CM

## 2015-05-31 LAB — MICROALBUMIN, URINE: Microalb, Ur: 0.2 mg/dL (ref <2.0)

## 2015-05-31 LAB — LIPID PANEL
CHOL/HDL RATIO: 5.6 ratio — AB (ref <=5.0)
Cholesterol: 286 mg/dL — ABNORMAL HIGH (ref 125–200)
HDL: 51 mg/dL (ref >=46)
LDL CALC: 192 mg/dL — AB (ref <130)
Triglycerides: 216 mg/dL — ABNORMAL HIGH (ref <150)
VLDL: 43 mg/dL — ABNORMAL HIGH (ref <30)

## 2015-05-31 LAB — TSH: TSH: 0.644 u[IU]/mL (ref 0.350–4.500)

## 2015-06-01 LAB — VITAMIN D 25 HYDROXY (VIT D DEFICIENCY, FRACTURES): Vit D, 25-Hydroxy: 10 ng/mL — ABNORMAL LOW (ref 30–100)

## 2015-06-02 ENCOUNTER — Telehealth: Payer: Self-pay

## 2015-06-02 MED ORDER — LOVASTATIN 40 MG PO TABS: 40.0000 mg | ORAL_TABLET | Freq: Every day | ORAL | Status: DC

## 2015-06-02 MED ORDER — VITAMIN D (ERGOCALCIFEROL) 1.25 MG (50000 UNIT) PO CAPS: 50000.0000 [IU] | ORAL_CAPSULE | ORAL | Status: DC

## 2015-06-02 NOTE — Telephone Encounter (Signed)
Patient is aware of her lab results And prescriptions for lovastatin  and vitamin D [facility] Patient verbalized that she will pick them up this afternoon

## 2015-06-02 NOTE — Telephone Encounter (Signed)
-----   Message from Ambrose Finland, NP sent at 06/01/2015  8:30 AM EDT ----- Please have her to increase her lovastatin to 40 mg daily, please send updated prescription. Cholesterol is severely elevated.   Vitamin D is low. Please send drisdol 50,000 IU to take once weekly for 12 weeks. 12 tablets no refills. Vitamin D is for bone health and may help with feeling fatigued.

## 2015-06-07 ENCOUNTER — Ambulatory Visit: Payer: Self-pay | Attending: Internal Medicine | Admitting: *Deleted

## 2015-06-07 VITALS — BP 103/79 | HR 93 | Temp 98.3°F | Resp 16 | Ht 65.0 in | Wt 193.4 lb

## 2015-06-07 DIAGNOSIS — I10 Essential (primary) hypertension: Secondary | ICD-10-CM | POA: Insufficient documentation

## 2015-06-07 DIAGNOSIS — Z794 Long term (current) use of insulin: Secondary | ICD-10-CM | POA: Insufficient documentation

## 2015-06-07 DIAGNOSIS — Z720 Tobacco use: Secondary | ICD-10-CM | POA: Insufficient documentation

## 2015-06-07 DIAGNOSIS — E1165 Type 2 diabetes mellitus with hyperglycemia: Secondary | ICD-10-CM | POA: Insufficient documentation

## 2015-06-07 LAB — GLUCOSE, POCT (MANUAL RESULT ENTRY): POC GLUCOSE: 112 mg/dL — AB (ref 70–99)

## 2015-06-07 MED ORDER — INSULIN NPH ISOPHANE & REGULAR (70-30) 100 UNIT/ML ~~LOC~~ SUSP: 65.0000 [IU] | Freq: Two times a day (BID) | SUBCUTANEOUS | Status: DC

## 2015-06-07 MED ORDER — MECLIZINE HCL 25 MG PO TABS: 25.0000 mg | ORAL_TABLET | Freq: Three times a day (TID) | ORAL | Status: DC | PRN

## 2015-06-07 NOTE — Patient Instructions (Signed)
Smoking Cessation, Tips for Success  If you are ready to quit smoking, congratulations! You have chosen to help yourself be healthier. Cigarettes bring nicotine, tar, carbon monoxide, and other irritants into your body. Your lungs, heart, and blood vessels will be able to work better without these poisons. There are many different ways to quit smoking. Nicotine gum, nicotine patches, a nicotine inhaler, or nicotine nasal spray can help with physical craving. Hypnosis, support groups, and medicines help break the habit of smoking.  WHAT THINGS CAN I DO TO MAKE QUITTING EASIER?   Here are some tips to help you quit for good:  · Pick a date when you will quit smoking completely. Tell all of your friends and family about your plan to quit on that date.  · Do not try to slowly cut down on the number of cigarettes you are smoking. Pick a quit date and quit smoking completely starting on that day.  · Throw away all cigarettes.    · Clean and remove all ashtrays from your home, work, and car.  · On a card, write down your reasons for quitting. Carry the card with you and read it when you get the urge to smoke.  · Cleanse your body of nicotine. Drink enough water and fluids to keep your urine clear or pale yellow. Do this after quitting to flush the nicotine from your body.  · Learn to predict your moods. Do not let a bad situation be your excuse to have a cigarette. Some situations in your life might tempt you into wanting a cigarette.  · Never have "just one" cigarette. It leads to wanting another and another. Remind yourself of your decision to quit.  · Change habits associated with smoking. If you smoked while driving or when feeling stressed, try other activities to replace smoking. Stand up when drinking your coffee. Brush your teeth after eating. Sit in a different chair when you read the paper. Avoid alcohol while trying to quit, and try to drink fewer caffeinated beverages. Alcohol and caffeine may urge you to  smoke.  · Avoid foods and drinks that can trigger a desire to smoke, such as sugary or spicy foods and alcohol.  · Ask people who smoke not to smoke around you.  · Have something planned to do right after eating or having a cup of coffee. For example, plan to take a walk or exercise.  · Try a relaxation exercise to calm you down and decrease your stress. Remember, you may be tense and nervous for the first 2 weeks after you quit, but this will pass.  · Find new activities to keep your hands busy. Play with a pen, coin, or rubber band. Doodle or draw things on paper.  · Brush your teeth right after eating. This will help cut down on the craving for the taste of tobacco after meals. You can also try mouthwash.    · Use oral substitutes in place of cigarettes. Try using lemon drops, carrots, cinnamon sticks, or chewing gum. Keep them handy so they are available when you have the urge to smoke.  · When you have the urge to smoke, try deep breathing.  · Designate your home as a nonsmoking area.  · If you are a heavy smoker, ask your health care provider about a prescription for nicotine chewing gum. It can ease your withdrawal from nicotine.  · Reward yourself. Set aside the cigarette money you save and buy yourself something nice.  · Look for   support from others. Join a support group or smoking cessation program. Ask someone at home or at work to help you with your plan to quit smoking.  · Always ask yourself, "Do I need this cigarette or is this just a reflex?" Tell yourself, "Today, I choose not to smoke," or "I do not want to smoke." You are reminding yourself of your decision to quit.  · Do not replace cigarette smoking with electronic cigarettes (commonly called e-cigarettes). The safety of e-cigarettes is unknown, and some may contain harmful chemicals.  · If you relapse, do not give up! Plan ahead and think about what you will do the next time you get the urge to smoke.  HOW WILL I FEEL WHEN I QUIT SMOKING?  You  may have symptoms of withdrawal because your body is used to nicotine (the addictive substance in cigarettes). You may crave cigarettes, be irritable, feel very hungry, cough often, get headaches, or have difficulty concentrating. The withdrawal symptoms are only temporary. They are strongest when you first quit but will go away within 10-14 days. When withdrawal symptoms occur, stay in control. Think about your reasons for quitting. Remind yourself that these are signs that your body is healing and getting used to being without cigarettes. Remember that withdrawal symptoms are easier to treat than the major diseases that smoking can cause.   Even after the withdrawal is over, expect periodic urges to smoke. However, these cravings are generally short lived and will go away whether you smoke or not. Do not smoke!  WHAT RESOURCES ARE AVAILABLE TO HELP ME QUIT SMOKING?  Your health care provider can direct you to community resources or hospitals for support, which may include:  · Group support.  · Education.  · Hypnosis.  · Therapy.  Document Released: 07/05/2004 Document Revised: 02/21/2014 Document Reviewed: 03/25/2013  Exit[employer]® Patient Information ©2015 [employer]. This information is not intended to replace advice given to you by your health care provider. Make sure you discuss any questions you have with your health care provider.  Smoking Cessation  Quitting smoking is important to your health and has many advantages. However, it is not always easy to quit since nicotine is a very addictive drug. Oftentimes, people try 3 times or more before being able to quit. This document explains the best ways for you to prepare to quit smoking. Quitting takes hard work and a lot of effort, but you can do it.  ADVANTAGES OF QUITTING SMOKING  · You will live longer, feel better, and live better.  · Your body will feel the impact of quitting smoking almost immediately.  · Within 20 minutes, blood pressure decreases. Your  pulse returns to its normal level.  · After 8 hours, carbon monoxide levels in the blood return to normal. Your oxygen level increases.  · After 24 hours, the chance of having a heart attack starts to decrease. Your breath, hair, and body stop smelling like smoke.  · After 48 hours, damaged nerve endings begin to recover. Your sense of taste and smell improve.  · After 72 hours, the body is virtually free of nicotine. Your bronchial tubes relax and breathing becomes easier.  · After 2 to 12 weeks, lungs can hold more air. Exercise becomes easier and circulation improves.  · The risk of having a heart attack, stroke, cancer, or lung disease is greatly reduced.  · After 1 year, the risk of coronary heart disease is cut in half.  · After 5 years,   the risk of stroke falls to the same as a nonsmoker.  · After 10 years, the risk of lung cancer is cut in half and the risk of other cancers decreases significantly.  · After 15 years, the risk of coronary heart disease drops, usually to the level of a nonsmoker.  · If you are pregnant, quitting smoking will improve your chances of having a healthy baby.  · The people you live with, especially any children, will be healthier.  · You will have extra money to spend on things other than cigarettes.  QUESTIONS TO THINK ABOUT BEFORE ATTEMPTING TO QUIT  You may want to talk about your answers with your health care provider.  · Why do you want to quit?  · If you tried to quit in the past, what helped and what did not?  · What will be the most difficult situations for you after you quit? How will you plan to handle them?  · Who can help you through the tough times? Your family? Friends? A health care provider?  · What pleasures do you get from smoking? What ways can you still get pleasure if you quit?  Here are some questions to ask your health care provider:  · How can you help me to be successful at quitting?  · What medicine do you think would be best for me and how should I take  it?  · What should I do if I need more help?  · What is smoking withdrawal like? How can I get information on withdrawal?  GET READY  · Set a quit date.  · Change your environment by getting rid of all cigarettes, ashtrays, matches, and lighters in your home, car, or work. Do not let people smoke in your home.  · Review your past attempts to quit. Think about what worked and what did not.  GET SUPPORT AND ENCOURAGEMENT  You have a better chance of being successful if you have help. You can get support in many ways.  · Tell your family, friends, and coworkers that you are going to quit and need their support. Ask them not to smoke around you.  · Get individual, group, or telephone counseling and support. Programs are available at local hospitals and health centers. Call your local health department for information about programs in your area.  · Spiritual beliefs and practices may help some smokers quit.  · Download a "quit meter" on your computer to keep track of quit statistics, such as how long you have gone without smoking, cigarettes not smoked, and money saved.  · Get a self-help book about quitting smoking and staying off tobacco.  LEARN NEW SKILLS AND BEHAVIORS  · Distract yourself from urges to smoke. Talk to someone, go for a walk, or occupy your time with a task.  · Change your normal routine. Take a different route to work. Drink tea instead of coffee. Eat breakfast in a different place.  · Reduce your stress. Take a hot bath, exercise, or read a book.  · Plan something enjoyable to do every day. Reward yourself for not smoking.  · Explore interactive web-based programs that specialize in helping you quit.  GET MEDICINE AND USE IT CORRECTLY  Medicines can help you stop smoking and decrease the urge to smoke. Combining medicine with the above behavioral methods and support can greatly increase your chances of successfully quitting smoking.  · Nicotine replacement therapy helps deliver nicotine to your body  without the negative   effects and risks of smoking. Nicotine replacement therapy includes nicotine gum, lozenges, inhalers, nasal sprays, and skin patches. Some may be available over-the-counter and others require a prescription.  · Antidepressant medicine helps people abstain from smoking, but how this works is unknown. This medicine is available by prescription.  · Nicotinic receptor partial agonist medicine simulates the effect of nicotine in your brain. This medicine is available by prescription.  Ask your health care provider for advice about which medicines to use and how to use them based on your health history. Your health care provider will tell you what side effects to look out for if you choose to be on a medicine or therapy. Carefully read the information on the package. Do not use any other product containing nicotine while using a nicotine replacement product.   RELAPSE OR DIFFICULT SITUATIONS  Most relapses occur within the first 3 months after quitting. Do not be discouraged if you start smoking again. Remember, most people try several times before finally quitting. You may have symptoms of withdrawal because your body is used to nicotine. You may crave cigarettes, be irritable, feel very hungry, cough often, get headaches, or have difficulty concentrating. The withdrawal symptoms are only temporary. They are strongest when you first quit, but they will go away within 10-14 days.  To reduce the chances of relapse, try to:  · Avoid drinking alcohol. Drinking lowers your chances of successfully quitting.  · Reduce the amount of caffeine you consume. Once you quit smoking, the amount of caffeine in your body increases and can give you symptoms, such as a rapid heartbeat, sweating, and anxiety.  · Avoid smokers because they can make you want to smoke.  · Do not let weight gain distract you. Many smokers will gain weight when they quit, usually less than 10 pounds. Eat a healthy diet and stay active. You  can always lose the weight gained after you quit.  · Find ways to improve your mood other than smoking.  FOR MORE INFORMATION   www.smokefree.gov   Document Released: 10/01/2001 Document Revised: 02/21/2014 Document Reviewed: 01/16/2012  [employer]® Patient Information ©2015 [employer]. This information is not intended to replace advice given to you by your health care provider. Make sure you discuss any questions you have with your health care provider.

## 2015-06-07 NOTE — Progress Notes (Signed)
Patient presents for BP check, CBG and record review for T2DM Med list reviewed; patient reports taking all meds as directed taking 75/25 insulin 65 units bid Added prazosin 1 mg q HS to prevent nightmares ([facility]) Patient's AM fasting blood sugars ranging 140-269 Patient's before lunch blood sugars ranging 107-231 Patient's before dinner blood sugars ranging 213 and 293 Patient's 2 hours after dinner blood sugars ranging 107-253 (3 reading) Smoking 1 ppd; wants to quit but admits to needing help Denies increased thirst and urination Positive for blurred vision. Needs to visit eye doctor C/o vertigo; states fell 4 days ago down 5 stairs, bruised both knees and left hand and left side   CBG 140 AM fasting per patient record CBG 112 in-house  Lab Results  Component Value Date   HGBA1C >15.0 05/30/2015   Filed Vitals:   06/07/15 1231  BP: 103/79  Pulse: 93  Temp: 98.3 F (36.8 C)  Resp: 16     Per PCP: Switch to 70/30 insulin 65 units bid Meclizine 25 mg 0.5 -1 tab tid prn   Patient advised to call for med refills at least 7 days before running out so as not to go without.  Patient to return in 2 weeks for nurse visit for CBG and record review  Patient given literature on Smoking Cessation and Given 1-800- QUIT-NOW  Patient given literature on [email or web address] for customized diet and lifestyle support

## 2015-06-14 ENCOUNTER — Ambulatory Visit: Payer: Self-pay

## 2015-06-21 ENCOUNTER — Encounter: Payer: Self-pay | Admitting: Internal Medicine

## 2015-06-21 ENCOUNTER — Ambulatory Visit: Payer: Self-pay | Attending: Internal Medicine | Admitting: *Deleted

## 2015-06-21 VITALS — BP 128/80 | HR 89 | Temp 98.7°F | Resp 16 | Ht 65.0 in | Wt 196.8 lb

## 2015-06-21 DIAGNOSIS — G8929 Other chronic pain: Secondary | ICD-10-CM | POA: Insufficient documentation

## 2015-06-21 DIAGNOSIS — Z794 Long term (current) use of insulin: Secondary | ICD-10-CM | POA: Insufficient documentation

## 2015-06-21 DIAGNOSIS — M25551 Pain in right hip: Secondary | ICD-10-CM | POA: Insufficient documentation

## 2015-06-21 DIAGNOSIS — Z720 Tobacco use: Secondary | ICD-10-CM | POA: Insufficient documentation

## 2015-06-21 DIAGNOSIS — E1165 Type 2 diabetes mellitus with hyperglycemia: Secondary | ICD-10-CM | POA: Insufficient documentation

## 2015-06-21 DIAGNOSIS — I10 Essential (primary) hypertension: Secondary | ICD-10-CM | POA: Insufficient documentation

## 2015-06-21 LAB — GLUCOSE, POCT (MANUAL RESULT ENTRY): POC GLUCOSE: 112 mg/dL — AB (ref 70–99)

## 2015-06-21 NOTE — Progress Notes (Signed)
Patient presents for BP check, CBG and record review for T2DM, however, patient did not bring log or meter Med list reviewed; patient reports taking all meds as directed Taking 65 units 70/30 insulin bid, metformin 1000 mg bid Patient reports AM fasting blood sugars ranging 109-142 Patient reports before dinner blood sugars ranging 140-170 Has been using [email or web address] for meal planning Walking around track daily for exercise Denies increased thirst and urination Positive for blurred vision Ambulating today without cane Still having right hip pain which she thinks is causing right knee pain States received steroid injections in hip in past with relief Reduced smoking from 1 ppd to 1 pp week  CBG 109 AM fasting per patient CBG 112 in-house 2.5 hours ago after snack of grilled cheese sandwich with bacon and cup of peaches and water  Lab Results  Component Value Date   HGBA1C >15.0 05/30/2015   Filed Vitals:   06/21/15 1552  BP: 128/80  Pulse: 89  Temp: 98.7 F (37.1 C)  Resp: 16     Per PCP: No changes to meds at this time Keep up the great work! Referral to orthopaedic  Patient advised to call for med refills at least 7 days before running out so as not to go without.  Patient aware that she is to f/u with PCP 3 months from last visit. Due 08/30/2015

## 2015-06-28 ENCOUNTER — Ambulatory Visit: Payer: Self-pay | Admitting: Family Medicine

## 2015-07-12 ENCOUNTER — Ambulatory Visit: Payer: Self-pay | Admitting: Family Medicine

## 2015-07-19 ENCOUNTER — Encounter (HOSPITAL_COMMUNITY): Payer: Self-pay

## 2015-07-19 ENCOUNTER — Inpatient Hospital Stay (HOSPITAL_COMMUNITY)
Admission: AD | Admit: 2015-07-19 | Discharge: 2015-07-25 | DRG: 885 | Disposition: A | Discharge status: Home / Self Care | Payer: Federal, State, Local not specified - Other | Source: Intra-hospital | Attending: Emergency Medicine | Admitting: Emergency Medicine

## 2015-07-19 ENCOUNTER — Emergency Department (HOSPITAL_COMMUNITY)
Admission: EM | Admit: 2015-07-19 | Discharge: 2015-07-19 | Disposition: A | Discharge status: Other Acute Inpatient Hospital | Payer: Medicaid Other | Attending: Emergency Medicine | Admitting: Emergency Medicine

## 2015-07-19 DIAGNOSIS — I10 Essential (primary) hypertension: Secondary | ICD-10-CM | POA: Diagnosis present

## 2015-07-19 DIAGNOSIS — F332 Major depressive disorder, recurrent severe without psychotic features: Principal | ICD-10-CM | POA: Diagnosis present

## 2015-07-19 DIAGNOSIS — F329 Major depressive disorder, single episode, unspecified: Secondary | ICD-10-CM | POA: Insufficient documentation

## 2015-07-19 DIAGNOSIS — F4310 Post-traumatic stress disorder, unspecified: Secondary | ICD-10-CM | POA: Diagnosis present

## 2015-07-19 DIAGNOSIS — E669 Obesity, unspecified: Secondary | ICD-10-CM | POA: Insufficient documentation

## 2015-07-19 DIAGNOSIS — Z599 Problem related to housing and economic circumstances, unspecified: Secondary | ICD-10-CM

## 2015-07-19 DIAGNOSIS — F1410 Cocaine abuse, uncomplicated: Secondary | ICD-10-CM | POA: Diagnosis not present

## 2015-07-19 DIAGNOSIS — R000 Tachycardia, unspecified: Secondary | ICD-10-CM | POA: Insufficient documentation

## 2015-07-19 DIAGNOSIS — Z862 Personal history of diseases of the blood and blood-forming organs and certain disorders involving the immune mechanism: Secondary | ICD-10-CM | POA: Insufficient documentation

## 2015-07-19 DIAGNOSIS — Z833 Family history of diabetes mellitus: Secondary | ICD-10-CM

## 2015-07-19 DIAGNOSIS — R45851 Suicidal ideations: Secondary | ICD-10-CM

## 2015-07-19 DIAGNOSIS — G8929 Other chronic pain: Secondary | ICD-10-CM | POA: Diagnosis present

## 2015-07-19 DIAGNOSIS — E785 Hyperlipidemia, unspecified: Secondary | ICD-10-CM | POA: Insufficient documentation

## 2015-07-19 DIAGNOSIS — R079 Chest pain, unspecified: Secondary | ICD-10-CM

## 2015-07-19 DIAGNOSIS — M1990 Unspecified osteoarthritis, unspecified site: Secondary | ICD-10-CM | POA: Insufficient documentation

## 2015-07-19 DIAGNOSIS — Z794 Long term (current) use of insulin: Secondary | ICD-10-CM | POA: Insufficient documentation

## 2015-07-19 DIAGNOSIS — M797 Fibromyalgia: Secondary | ICD-10-CM | POA: Insufficient documentation

## 2015-07-19 DIAGNOSIS — Z720 Tobacco use: Secondary | ICD-10-CM | POA: Insufficient documentation

## 2015-07-19 DIAGNOSIS — G4700 Insomnia, unspecified: Secondary | ICD-10-CM | POA: Diagnosis present

## 2015-07-19 DIAGNOSIS — E781 Pure hyperglyceridemia: Secondary | ICD-10-CM | POA: Diagnosis present

## 2015-07-19 DIAGNOSIS — F17200 Nicotine dependence, unspecified, uncomplicated: Secondary | ICD-10-CM | POA: Diagnosis present

## 2015-07-19 DIAGNOSIS — Z8249 Family history of ischemic heart disease and other diseases of the circulatory system: Secondary | ICD-10-CM

## 2015-07-19 DIAGNOSIS — E7800 Pure hypercholesterolemia, unspecified: Secondary | ICD-10-CM | POA: Diagnosis present

## 2015-07-19 DIAGNOSIS — Z79899 Other long term (current) drug therapy: Secondary | ICD-10-CM | POA: Insufficient documentation

## 2015-07-19 DIAGNOSIS — K219 Gastro-esophageal reflux disease without esophagitis: Secondary | ICD-10-CM | POA: Diagnosis present

## 2015-07-19 DIAGNOSIS — E1142 Type 2 diabetes mellitus with diabetic polyneuropathy: Secondary | ICD-10-CM | POA: Diagnosis present

## 2015-07-19 DIAGNOSIS — E119 Type 2 diabetes mellitus without complications: Secondary | ICD-10-CM | POA: Insufficient documentation

## 2015-07-19 DIAGNOSIS — G629 Polyneuropathy, unspecified: Secondary | ICD-10-CM | POA: Insufficient documentation

## 2015-07-19 DIAGNOSIS — F32A Depression, unspecified: Secondary | ICD-10-CM

## 2015-07-19 DIAGNOSIS — F419 Anxiety disorder, unspecified: Secondary | ICD-10-CM | POA: Insufficient documentation

## 2015-07-19 DIAGNOSIS — F1210 Cannabis abuse, uncomplicated: Secondary | ICD-10-CM | POA: Insufficient documentation

## 2015-07-19 DIAGNOSIS — F14129 Cocaine abuse with intoxication, unspecified: Secondary | ICD-10-CM | POA: Diagnosis present

## 2015-07-19 LAB — ETHANOL

## 2015-07-19 LAB — CBC
HCT: 41.3 % (ref 36.0–46.0)
Hemoglobin: 14 g/dL (ref 12.0–15.0)
MCH: 28.7 pg (ref 26.0–34.0)
MCHC: 33.9 g/dL (ref 30.0–36.0)
MCV: 84.8 fL (ref 78.0–100.0)
PLATELETS: 307 10*3/uL (ref 150–400)
RBC: 4.87 MIL/uL (ref 3.87–5.11)
RDW: 13.3 % (ref 11.5–15.5)
WBC: 11.1 10*3/uL — AB (ref 4.0–10.5)

## 2015-07-19 LAB — COMPREHENSIVE METABOLIC PANEL
ALT: 16 U/L (ref 14–54)
ANION GAP: 11 (ref 5–15)
AST: 19 U/L (ref 15–41)
Albumin: 4.4 g/dL (ref 3.5–5.0)
Alkaline Phosphatase: 80 U/L (ref 38–126)
BILIRUBIN TOTAL: 0.5 mg/dL (ref 0.3–1.2)
BUN: 6 mg/dL (ref 6–20)
CO2: 24 mmol/L (ref 22–32)
Calcium: 10.1 mg/dL (ref 8.9–10.3)
Chloride: 99 mmol/L — ABNORMAL LOW (ref 101–111)
Creatinine, Ser: 0.81 mg/dL (ref 0.44–1.00)
GFR calc Af Amer: >60 mL/min (ref >60)
Glucose, Bld: 291 mg/dL — ABNORMAL HIGH (ref 65–99)
POTASSIUM: 3.3 mmol/L — AB (ref 3.5–5.1)
Sodium: 134 mmol/L — ABNORMAL LOW (ref 135–145)
TOTAL PROTEIN: 7.7 g/dL (ref 6.5–8.1)

## 2015-07-19 LAB — RAPID URINE DRUG SCREEN, HOSP PERFORMED
Amphetamines: NOT DETECTED
BENZODIAZEPINES: NOT DETECTED
Barbiturates: NOT DETECTED
Cocaine: POSITIVE — AB
Opiates: NOT DETECTED
Tetrahydrocannabinol: POSITIVE — AB

## 2015-07-19 LAB — ACETAMINOPHEN LEVEL: Acetaminophen (Tylenol), Serum: <10 ug/mL — ABNORMAL LOW (ref 10–30)

## 2015-07-19 LAB — GLUCOSE, CAPILLARY
GLUCOSE-CAPILLARY: 419 mg/dL — AB (ref 65–99)
GLUCOSE-CAPILLARY: 181 mg/dL — AB (ref 65–99)

## 2015-07-19 LAB — SALICYLATE LEVEL: Salicylate Lvl: <4 mg/dL (ref 2.8–30.0)

## 2015-07-19 LAB — CBG MONITORING, ED: GLUCOSE-CAPILLARY: 315 mg/dL — AB (ref 65–99)

## 2015-07-19 LAB — POC URINE PREG, ED: PREG TEST UR: NEGATIVE

## 2015-07-19 MED ORDER — ONDANSETRON HCL 4 MG PO TABS: 4.0000 mg | ORAL_TABLET | Freq: Three times a day (TID) | ORAL | Status: DC | PRN

## 2015-07-19 MED ORDER — GABAPENTIN 300 MG PO CAPS
300.0000 mg | ORAL_CAPSULE | Freq: Three times a day (TID) | ORAL | Status: DC
  Administered 2015-07-20 – 2015-07-21 (×5): 300 mg via ORAL
  Filled 2015-07-19 (×7): qty 1

## 2015-07-19 MED ORDER — IBUPROFEN 400 MG PO TABS: 600.0000 mg | ORAL_TABLET | Freq: Three times a day (TID) | ORAL | Status: DC | PRN

## 2015-07-19 MED ORDER — LISINOPRIL 20 MG PO TABS
20.0000 mg | ORAL_TABLET | Freq: Every day | ORAL | Status: DC
  Administered 2015-07-20 – 2015-07-25 (×6): 20 mg via ORAL
  Filled 2015-07-19 (×8): qty 1

## 2015-07-19 MED ORDER — ALUM & MAG HYDROXIDE-SIMETH 200-200-20 MG/5ML PO SUSP: 30.0000 mL | ORAL | Status: DC | PRN

## 2015-07-19 MED ORDER — INSULIN ASPART 100 UNIT/ML ~~LOC~~ SOLN
16.0000 [IU] | Freq: Once | SUBCUTANEOUS | Status: AC
  Administered 2015-07-19: 16 [IU] via SUBCUTANEOUS

## 2015-07-19 MED ORDER — METFORMIN HCL 500 MG PO TABS: 1000.0000 mg | ORAL_TABLET | Freq: Two times a day (BID) | ORAL | Status: DC

## 2015-07-19 MED ORDER — LORAZEPAM 1 MG PO TABS
1.0000 mg | ORAL_TABLET | Freq: Three times a day (TID) | ORAL | Status: DC | PRN
  Administered 2015-07-19: 1 mg via ORAL
  Filled 2015-07-19: qty 1

## 2015-07-19 MED ORDER — HYDROXYZINE HCL 50 MG PO TABS
50.0000 mg | ORAL_TABLET | Freq: Three times a day (TID) | ORAL | Status: DC | PRN
  Administered 2015-07-23: 50 mg via ORAL
  Filled 2015-07-19: qty 20
  Filled 2015-07-19: qty 1

## 2015-07-19 MED ORDER — MOMETASONE FURO-FORMOTEROL FUM 200-5 MCG/ACT IN AERO
2.0000 | INHALATION_SPRAY | Freq: Two times a day (BID) | RESPIRATORY_TRACT | Status: DC
  Administered 2015-07-19 – 2015-07-25 (×12): 2 via RESPIRATORY_TRACT
  Filled 2015-07-19 (×3): qty 8.8

## 2015-07-19 MED ORDER — LISINOPRIL 20 MG PO TABS
20.0000 mg | ORAL_TABLET | Freq: Every day | ORAL | Status: DC
  Administered 2015-07-19: 20 mg via ORAL
  Filled 2015-07-19: qty 1

## 2015-07-19 MED ORDER — INSULIN ASPART PROT & ASPART (70-30 MIX) 100 UNIT/ML ~~LOC~~ SUSP
65.0000 [IU] | Freq: Two times a day (BID) | SUBCUTANEOUS | Status: DC
  Administered 2015-07-20 – 2015-07-24 (×9): 65 [IU] via SUBCUTANEOUS

## 2015-07-19 MED ORDER — PANTOPRAZOLE SODIUM 40 MG PO TBEC
40.0000 mg | DELAYED_RELEASE_TABLET | Freq: Every day | ORAL | Status: DC
  Administered 2015-07-20 – 2015-07-25 (×6): 40 mg via ORAL
  Filled 2015-07-19 (×8): qty 1

## 2015-07-19 MED ORDER — PRAZOSIN HCL 1 MG PO CAPS: 1.0000 mg | ORAL_CAPSULE | Freq: Every day | ORAL | Status: DC

## 2015-07-19 MED ORDER — INSULIN ASPART PROT & ASPART (70-30 MIX) 100 UNIT/ML ~~LOC~~ SUSP: 30.0000 [IU] | Freq: Two times a day (BID) | SUBCUTANEOUS | Status: DC

## 2015-07-19 MED ORDER — PRAVASTATIN SODIUM 40 MG PO TABS
40.0000 mg | ORAL_TABLET | Freq: Every day | ORAL | Status: DC
  Administered 2015-07-20 – 2015-07-24 (×5): 40 mg via ORAL
  Filled 2015-07-19 (×7): qty 1

## 2015-07-19 MED ORDER — INSULIN ASPART 100 UNIT/ML ~~LOC~~ SOLN
0.0000 [IU] | Freq: Three times a day (TID) | SUBCUTANEOUS | Status: DC
  Administered 2015-07-20: 3 [IU] via SUBCUTANEOUS
  Administered 2015-07-20: 11 [IU] via SUBCUTANEOUS
  Administered 2015-07-21 (×2): 4 [IU] via SUBCUTANEOUS
  Administered 2015-07-22: 3 [IU] via SUBCUTANEOUS
  Administered 2015-07-23: 7 [IU] via SUBCUTANEOUS
  Administered 2015-07-23: 3 [IU] via SUBCUTANEOUS
  Administered 2015-07-24: 4 [IU] via SUBCUTANEOUS

## 2015-07-19 MED ORDER — FLUOXETINE HCL 20 MG PO CAPS
80.0000 mg | ORAL_CAPSULE | Freq: Every day | ORAL | Status: DC
  Administered 2015-07-20: 80 mg via ORAL
  Filled 2015-07-19 (×3): qty 4

## 2015-07-19 MED ORDER — PRAVASTATIN SODIUM 40 MG PO TABS: 40.0000 mg | ORAL_TABLET | Freq: Every day | ORAL | Status: DC

## 2015-07-19 MED ORDER — TRAZODONE HCL 50 MG PO TABS
50.0000 mg | ORAL_TABLET | Freq: Every evening | ORAL | Status: DC | PRN
  Administered 2015-07-19 – 2015-07-23 (×5): 50 mg via ORAL
  Filled 2015-07-19 (×16): qty 1

## 2015-07-19 MED ORDER — IBUPROFEN 800 MG PO TABS
800.0000 mg | ORAL_TABLET | Freq: Four times a day (QID) | ORAL | Status: DC | PRN
  Administered 2015-07-19 – 2015-07-23 (×5): 800 mg via ORAL
  Filled 2015-07-19 (×5): qty 1

## 2015-07-19 MED ORDER — PRAZOSIN HCL 1 MG PO CAPS
1.0000 mg | ORAL_CAPSULE | Freq: Every day | ORAL | Status: DC
  Administered 2015-07-19 – 2015-07-23 (×5): 1 mg via ORAL
  Filled 2015-07-19 (×9): qty 1

## 2015-07-19 MED ORDER — INSULIN ASPART 100 UNIT/ML ~~LOC~~ SOLN: 0.0000 [IU] | Freq: Every day | SUBCUTANEOUS | Status: DC

## 2015-07-19 MED ORDER — FLUTICASONE PROPIONATE 50 MCG/ACT NA SUSP
2.0000 | Freq: Every day | NASAL | Status: DC
  Administered 2015-07-21 – 2015-07-25 (×5): 2 via NASAL
  Filled 2015-07-19: qty 16

## 2015-07-19 MED ORDER — METFORMIN HCL 500 MG PO TABS
1000.0000 mg | ORAL_TABLET | Freq: Two times a day (BID) | ORAL | Status: DC
  Administered 2015-07-20 – 2015-07-25 (×11): 1000 mg via ORAL
  Filled 2015-07-19 (×15): qty 2

## 2015-07-19 MED ORDER — MAGNESIUM HYDROXIDE 400 MG/5ML PO SUSP: 30.0000 mL | Freq: Every day | ORAL | Status: DC | PRN

## 2015-07-19 MED ORDER — FLUTICASONE PROPIONATE HFA 44 MCG/ACT IN AERO: 2.0000 | INHALATION_SPRAY | Freq: Two times a day (BID) | RESPIRATORY_TRACT | Status: DC | PRN

## 2015-07-19 MED ORDER — GABAPENTIN 300 MG PO CAPS
300.0000 mg | ORAL_CAPSULE | Freq: Three times a day (TID) | ORAL | Status: DC
Start: 2015-07-19 — End: 2015-07-19
  Administered 2015-07-19: 300 mg via ORAL
  Filled 2015-07-19: qty 1

## 2015-07-19 MED ORDER — VITAMIN D (ERGOCALCIFEROL) 1.25 MG (50000 UNIT) PO CAPS
50000.0000 [IU] | ORAL_CAPSULE | ORAL | Status: DC
  Administered 2015-07-19: 50000 [IU] via ORAL
  Filled 2015-07-19: qty 1

## 2015-07-19 MED ORDER — LIDOCAINE 5 % EX PTCH
1.0000 | MEDICATED_PATCH | Freq: Every day | CUTANEOUS | Status: DC
  Administered 2015-07-19 – 2015-07-25 (×6): 1 via TRANSDERMAL
  Filled 2015-07-19 (×9): qty 1

## 2015-07-19 MED ORDER — FLUTICASONE PROPIONATE HFA 44 MCG/ACT IN AERO
2.0000 | INHALATION_SPRAY | Freq: Two times a day (BID) | RESPIRATORY_TRACT | Status: DC | PRN
  Filled 2015-07-19: qty 10.6

## 2015-07-19 MED ORDER — FLUOXETINE HCL 20 MG PO CAPS
60.0000 mg | ORAL_CAPSULE | Freq: Every day | ORAL | Status: DC
  Administered 2015-07-19: 60 mg via ORAL
  Filled 2015-07-19: qty 3
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
## 2015-07-19 NOTE — BH Assessment (Signed)
TTS Vanessa Case will assess the patient.

## 2015-07-19 NOTE — ED Notes (Signed)
[place] transportation notified for transport. 

## 2015-07-19 NOTE — Tx Team (Addendum)
Initial Interdisciplinary Treatment Plan   PATIENT STRESSORS: Financial difficulties Occupational concerns Substance abuse   PATIENT STRENGTHS: Motivation for treatment/growth Supportive family/friends   PROBLEM LIST: Problem List/Patient Goals Date to be addressed Date deferred Reason deferred Estimated date of resolution  "Stop using drugs." 07/19/15     " Get resources for myself and my daughter." 07/19/15     depression      anxiety                                     DISCHARGE CRITERIA:  Adequate post-discharge living arrangements Improved stabilization in mood, thinking, and/or behavior Reduction of life-threatening or endangering symptoms to within safe limits  PRELIMINARY DISCHARGE PLAN: Attend aftercare/continuing care group Attend PHP/IOP Placement in alternative living arrangements  PATIENT/FAMIILY INVOLVEMENT: This treatment plan has been presented to and reviewed with the patient, Jaelie Aguilera, and/or family member.  The patient and family have been given the opportunity to ask questions and make suggestions.  Bethann Punches 07/19/2015, 6:56 PM

## 2015-07-19 NOTE — ED Provider Notes (Signed)
CSN: [id number]     Arrival date & time 07/19/15  0701 History   First MD Initiated Contact with Patient 07/19/15 [phone]     Chief Complaint  Patient presents with  . Psychiatric Evaluation     (Consider location/radiation/quality/duration/timing/severity/associated sxs/prior Treatment) HPI   44 year old female with history of diabetes, substance abuse, chronic pain, depression brought here via EMS from home for evaluation of suicidal ideation. Patient reports for the past 2-3 days she has in feeling very depressed having uncontrolled crying spells and feeling suicidal with thought of overdose on her medication. She attributed her suicidal ideation due to stress but did not specify a specific source. When asked if is related to her relationship, finance and her job she nodded yes. Patient states "I just want to live anymore". She reports prior attempted to commit suicide by overdosing on lithium in the past. She complained of insomnia and unable to sleep and feeling tired. She reported having decrease in appetite for the past several weeks. She denies any active homicidal ideation but does endorse both auditory and visual hallucination. She denies having any active pain at this time. Her husband is in the room and appears to be supportive. She is requesting for help with suicidal ideation. She has hysterectomy, unable to recall LMP. Patient is also an insulin-dependent diabetes, and have been taking her medication. She denies any recent medication changes.  Past Medical History  Diagnosis Date  . Anemia   . GERD (gastroesophageal reflux disease)   . Depression   . Anxiety   . 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 Hypertension     maintained on lisinopril hct, metoprolol-134/86 at pat visit  . Neuropathy   . Arthritis   . Hyperlipidemia   . Neuromuscular disorder   . Substance abuse   . Chronic headache   .  Fibromyalgia   . Obesity   . Chronic neck pain   . Chronic back pain   . Chronic abdominal pain    Past Surgical History  Procedure Laterality Date  . Cesarean section  x3  . Abdominal hysterectomy  10/30/2011    Procedure: HYSTERECTOMY ABDOMINAL;  Surgeon: Melina Schools, MD;  Location: [place] [facility];  Service: Gynecology;  Laterality: N/A;  . Salpingoophorectomy  10/30/2011    Procedure: SALPINGO OOPHERECTOMY;  Surgeon: Melina Schools, MD;  Location: [place] [facility];  Service: Gynecology;  Laterality: Right;  . Cholecystectomy N/A 06/28/2013    Procedure: LAPAROSCOPIC CHOLECYSTECTOMY;  Surgeon: Harl Bowie, MD;  Location: [facility] OR;  Service: General;  Laterality: N/A;  . Wrist surgery      carpel tunnel and tendonitis   Family History  Problem Relation Age of Onset  . Cystic fibrosis Father   . Diabetes Mother   . Diabetes Maternal Grandmother   . Heart disease Maternal Grandfather    Social History  Substance Use Topics  . Smoking status: Current Every Day Smoker -- 0.50 packs/day    Last Attempt to Quit: 10/24/1997  . Smokeless tobacco: Never Used     Comment: Smoking 1 pp week  . Alcohol Use: No   OB History    Gravida Para Term Preterm AB TAB SAB Ectopic Multiple Living   '3 3 3       3     '$ Review of Systems  All other systems reviewed and are negative.     Allergies  Percocet and Ambien  Home Medications   Prior to Admission medications   Medication Sig Start Date End Date Taking? Authorizing Provider  Blood Glucose Monitoring Suppl (TRUE METRIX METER) W/DEVICE KIT Check sugars 3 times per day 05/30/15   Lance Bosch, NP  FLUoxetine (PROZAC) 20 MG capsule Take 3 capsules (60 mg total) by mouth daily. Patient taking differently: Take 80 mg by mouth daily.  09/21/14   Benjamine Mola, FNP  fluticasone (FLOVENT HFA) 44 MCG/ACT inhaler Inhale 2 puffs into the lungs 2 (two) times daily as needed (for shortness of breath). 05/30/15   Lance Bosch, NP  gabapentin (NEURONTIN) 300  MG capsule Take 1 capsule (300 mg total) by mouth 3 (three) times daily. Take 600 mg at bedtime 05/30/15   Lance Bosch, NP  glucose blood (TRUE METRIX BLOOD GLUCOSE TEST) test strip Use as instructed 05/30/15   Lance Bosch, NP  hydrOXYzine (ATARAX/VISTARIL) 50 MG tablet Take 1 tablet (50 mg total) by mouth 3 (three) times daily as needed for anxiety. 09/21/14   Benjamine Mola, FNP  insulin NPH-regular Human (NOVOLIN 70/30) (70-30) 100 UNIT/ML injection Inject 65 Units into the skin 2 (two) times daily with a meal. 06/07/15   Lance Bosch, NP  Insulin Syringe-Needle U-100 (BD INSULIN SYRINGE ULTRAFINE) 31G X 15/64" 1 ML MISC Give insulin twice daily 05/30/15   Lance Bosch, NP  lidocaine (LIDODERM) 5 % Place 1 patch onto the skin daily. Remove & Discard patch within 12 hours or as directed by MD Apply the patch to the Right side of the neck. Patient not taking: Reported on 05/30/2015 02/15/15   Margarita Mail, PA-C  lisinopril (PRINIVIL,ZESTRIL) 20 MG tablet Take 1 tablet (20 mg total) by mouth daily. 05/30/15   Lance Bosch, NP  lovastatin (MEVACOR) 40 MG tablet Take 1 tablet (40 mg total) by mouth at bedtime. 06/02/15   Lance Bosch, NP  meclizine (ANTIVERT) 25 MG tablet Take 1 tablet (25 mg total) by mouth 3 (three) times daily as needed for dizziness. 06/07/15   Lance Bosch, NP  meloxicam (MOBIC) 7.5 MG tablet Take 2 tablets (15 mg total) by mouth daily. Patient not taking: Reported on 05/30/2015 03/20/15   Baron Sane, PA-C  metFORMIN (GLUCOPHAGE) 500 MG tablet Take 2 tablets (1,000 mg total) by mouth 2 (two) times daily with a meal. 05/30/15   Lance Bosch, NP  mometasone (NASONEX) 50 MCG/ACT nasal spray Place 2 sprays into the nose as needed. For congestion    Historical Provider, MD  mometasone-formoterol (DULERA) 200-5 MCG/ACT AERO Inhale 2 puffs into the lungs 2 (two) times daily as needed for wheezing.    Historical Provider, MD  omeprazole (PRILOSEC) 20 MG capsule Take 1 capsule  (20 mg total) by mouth daily. 05/30/15   Lance Bosch, NP  prazosin (MINIPRESS) 1 MG capsule Take 1 mg by mouth at bedtime. To prevent nightmares    Historical Provider, MD  TRUEPLUS LANCETS 28G MISC Check sugars 3 times per day 05/30/15   Lance Bosch, NP  Vitamin D, Ergocalciferol, (DRISDOL) 50000 UNITS CAPS capsule Take 1 capsule (50,000 Units total) by mouth every 7 (seven) days. 06/02/15   Lance Bosch, NP   BP 189/105 mmHg  Pulse 113  Temp(Src) 98.6 F (37 C) (Oral)  Resp 24  Ht $R'5\' 5"'Ee$  (1.651 m)  Wt 197 lb 11.2 oz (89.676 kg)  BMI 32.90 kg/m2  SpO2 100%  LMP 10/07/2011 Physical Exam  Constitutional:  She is oriented to person, place, and time. She appears well-developed and well-nourished. No distress.  Obese African American female, tearful, but nontoxic in appearance  HENT:  Head: Atraumatic.  Mouth/Throat: Oropharynx is clear and moist.  Eyes: Conjunctivae are normal.  Neck: Normal range of motion. Neck supple.  Cardiovascular:  Mild tachycardia without murmurs rubs or gallops  Pulmonary/Chest: Effort normal and breath sounds normal.  Abdominal: Soft. There is no tenderness.  Neurological: She is alert and oriented to person, place, and time.  Skin: No rash noted.  Psychiatric: Her speech is delayed. She is withdrawn. Thought content is not paranoid. She exhibits a depressed mood. She expresses suicidal ideation. She expresses no homicidal ideation.  Nursing note and vitals reviewed.   ED Course  Procedures (including critical care time)  Patient who is actively suicidal with plan. She will need psychiatric evaluation. We'll perform medical screening exam.  10:00 AM Patient has been medically screened and at this time there is no acute medical emergency that would preclude patient from further psychiatric examination. Evidence of hyperglycemia likely secondary to her diabetes. She will continue with insulin treatment while in the hospital. No evidence of metabolic  acidosis identified.  Will consult TTS for further care.    10:50 AM TTS has assessed pt and agrees that pt meets inpt admission for further psychiatric care.    Labs Review Labs Reviewed  COMPREHENSIVE METABOLIC PANEL - Abnormal; Notable for the following:    Sodium 134 (*)    Potassium 3.3 (*)    Chloride 99 (*)    Glucose, Bld 291 (*)    All other components within normal limits  ACETAMINOPHEN LEVEL - Abnormal; Notable for the following:    Acetaminophen (Tylenol), Serum <10 (*)    All other components within normal limits  CBC - Abnormal; Notable for the following:    WBC 11.1 (*)    All other components within normal limits  ETHANOL  SALICYLATE LEVEL  URINE RAPID DRUG SCREEN, HOSP PERFORMED  POC URINE PREG, ED    Imaging Review No results found. I have personally reviewed and evaluated these images and lab results as part of my medical decision-making.   EKG Interpretation None      MDM   Final diagnoses:  Depression with suicidal ideation    BP 150/87 mmHg  Pulse 97  Temp(Src) 98.6 F (37 C) (Oral)  Resp 24  Ht $R'5\' 5"'rR$  (1.651 m)  Wt 197 lb 11.2 oz (89.676 kg)  BMI 32.90 kg/m2  SpO2 100%  LMP 10/07/2011     Domenic Moras, PA-C 07/19/15 1001  Domenic Moras, PA-C 07/19/15 [place], MD 07/19/15 1546

## 2015-07-19 NOTE — ED Notes (Signed)
Pt brought in EMS from home.  Pt is tearful upon assessment and sts "I just don't want to live anymore."  Pt reports she has been feeling this way for two weeks but will not elaborate on a stressor.  Pt reports she wishes to overdose on her medications and "just go to sleep."  Pt sts "I'm just so tired."

## 2015-07-19 NOTE — ED Notes (Signed)
Staffing called for sitter.  Security called to wand pt.

## 2015-07-19 NOTE — Clinical Social Work Note (Signed)
CSW spoke with pt's RN to arrange for tele psych to be placed in pt room for TTS assessment  .Elray Buba, [place] [facility] Triage TTS

## 2015-07-19 NOTE — ED Notes (Addendum)
TTS consult in progress.  Breakfast tray ordered for pt.

## 2015-07-19 NOTE — Progress Notes (Signed)
9/28: Per Julieanne Cotton, patient accepted at [facility], to Dr. Jama Flavors, bed 404-1, to arrive any time. Call report at [phone]. RN Eme informed.  Melbourne Abts, LCSWA Disposition staff 07/19/2015 3:29 PM

## 2015-07-19 NOTE — Clinical Social Work Note (Signed)
CSW called and spoke with MD regarding pt disposition. [facility] states pt will have a bed at [facility] later this afternoon.  Will follow up with MD once bed is made available.  Elray Buba, LCSW [facility] Triage TTS

## 2015-07-19 NOTE — Progress Notes (Signed)
This is 44 yr old female who went to [facility] via EMS for SI. Pt was stressed out after being evicted from her house and currently is homeless. Pt is also worry about her 36 yrs old daughter who is currently staying between pt's mother and brother. Pt was cooperative during admission, pt was ambulatory but uses walker and is on high fall risk. Pt stated that she is hopeless and just want help to stop using drugs and be able to have her stability back. Pt was stable during admission, denied SI and verbally contracted for safety at the time of admission. Pt was oriented to the unit and room shown, q64min check in place to ensure safety, we will continue to monitor.

## 2015-07-19 NOTE — ED Notes (Signed)
All patient belongings sent home with son.

## 2015-07-19 NOTE — ED Notes (Signed)
Meds to be delivered from pharmacy shortly.

## 2015-07-19 NOTE — ED Notes (Addendum)
Pt updated on plan of care. Pt is sweating- sts she is just hot. Asked about drug and alcohol use. Pt reports she drinks 2-3 tall beers daily (last drink yesterday) smokes marijuana and uses cocaine. Pt denies feelings of withdrawal presently, denies hallucinations. Pt calm, at ease with blunted affect.

## 2015-07-20 DIAGNOSIS — R45851 Suicidal ideations: Secondary | ICD-10-CM

## 2015-07-20 DIAGNOSIS — F332 Major depressive disorder, recurrent severe without psychotic features: Principal | ICD-10-CM

## 2015-07-20 LAB — GLUCOSE, CAPILLARY
GLUCOSE-CAPILLARY: 171 mg/dL — AB (ref 65–99)
GLUCOSE-CAPILLARY: 254 mg/dL — AB (ref 65–99)
GLUCOSE-CAPILLARY: 144 mg/dL — AB (ref 65–99)
Glucose-Capillary: 98 mg/dL (ref 65–99)
Glucose-Capillary: 103 mg/dL — ABNORMAL HIGH (ref 65–99)

## 2015-07-20 MED ORDER — INFLUENZA VAC SPLIT QUAD 0.5 ML IM SUSY
0.5000 mL | PREFILLED_SYRINGE | INTRAMUSCULAR | Status: AC
  Administered 2015-07-21: 0.5 mL via INTRAMUSCULAR
  Filled 2015-07-20: qty 0.5

## 2015-07-20 MED ORDER — NICOTINE 21 MG/24HR TD PT24
21.0000 mg | MEDICATED_PATCH | Freq: Every day | TRANSDERMAL | Status: DC
  Administered 2015-07-20 – 2015-07-25 (×6): 21 mg via TRANSDERMAL
  Filled 2015-07-20 (×8): qty 1

## 2015-07-20 MED ORDER — PAROXETINE HCL 20 MG PO TABS
20.0000 mg | ORAL_TABLET | Freq: Every day | ORAL | Status: DC
  Administered 2015-07-21 – 2015-07-25 (×5): 20 mg via ORAL
  Filled 2015-07-20 (×7): qty 1

## 2015-07-20 NOTE — [facility] Group Notes (Signed)
[facility] Group Therapy 07/20/2015 1:15pm  Type of Therapy: [employer] Presentation  Participation Level: Active  Participation Quality: Attentive  Affect: Appropriate  Cognitive: Oriented  Insight: Developing/Improving  Engagement in Therapy: Engaged  Modes of Intervention: Discussion, Education and Socialization  Summary of Progress/Problems: [facility] [employer] ([facility]) Speaker came to talk about his personal journey with substance abuse and addiction. The pt processed ways by which to relate to the speaker. MHA speaker provided handouts and educational information pertaining to groups and services offered by the [facility]. Pt was engaged in speaker's presentation and was receptive to resources provided.    Chad Cordial, LCSWA 07/20/2015 2:12 PM

## 2015-07-20 NOTE — Tx Team (Signed)
Interdisciplinary Treatment Plan Update (Adult) Date: 07/20/2015   Date: 07/20/2015 2:13 PM  Progress in Treatment:  Attending groups: Yes  Participating in groups: Yes  Taking medication as prescribed: Yes  Tolerating medication: Yes  Family/Significant othe contact made: No, CSW assessing for appropriate contact. Patient understands diagnosis: Yes Discussing patient identified problems/goals with staff: Yes  Medical problems stabilized or resolved: Yes  Denies suicidal/homicidal ideation: Yes Patient has not harmed self or Others: Yes   New problem(s) identified: None identified at this time.   Discharge Plan or Barriers: CSW will assess for appropriate discharge plan and relevant barriers.   Additional comments: n/a   Reason for Continuation of Hospitalization:  Depression Medication stabilization Suicidal ideation Withdrawal symptoms  Estimated length of stay: 3-5 days  Review of initial/current patient goals per problem list:   1.  Goal(s): Patient will participate in aftercare plan  Met:  No  Target date: 3-5 days from date of admission   As evidenced by: Patient will participate within aftercare plan AEB aftercare provider and housing plan at discharge being identified.  07/20/15: CSW to work with Pt to assess for appropriate discharge plan and faciliate appointments and referrals as needed prior to d/c.  2.  Goal (s): Patient will exhibit decreased depressive symptoms and suicidal ideations.  Met:  No  Target date: 3-5 days from date of admission   As evidenced by: Patient will utilize self rating of depression at 3 or below and demonstrate decreased signs of depression or be deemed stable for discharge by MD. 07/20/15: Pt was admitted with symptoms of depression, rating 10/10. Pt continues to present with flat affect and depressive symptoms.  Pt will demonstrate decreased symptoms of depression and rate depression at 3/10 or lower prior to discharge.  4.   Goal(s): Patient will demonstrate decreased signs of withdrawal due to substance abuse  Met:  No  Target date: 3-5 days from date of admission   As evidenced by: Patient will produce a CIWA/COWS score of 0, have stable vitals signs, and no symptoms of withdrawal  07/20/15: Pt has CIWA score of 5; endorses elevated pulse, anxiety, auditory disturbances as symptoms of withdrawal.          Attendees:  Patient:    Family:    Physician: Dr. Parke Poisson, MD  07/20/2015 2:13 PM  Nursing: Lars Pinks, RN Case manager  07/20/2015 2:13 PM  Clinical Social Worker Norman Clay, MSW 07/20/2015 2:13 PM  Other: Lucinda Dell, Beverly Sessions Liasion 07/20/2015 2:13 PM  Clinical: Gaylan Gerold, RN 07/20/2015 2:13 PM  Other: , RN Charge Nurse 07/20/2015 2:13 PM  Other:     Peri Maris, [place] MSW

## 2015-07-20 NOTE — Progress Notes (Signed)
Patient did attend the evening karaoke group. Pt was engaged, supportive, and participated by singing two songs.   

## 2015-07-20 NOTE — Progress Notes (Signed)
D: Pt has depressed affect and mood.  She brightens upon approach.  Pt reports she had a good visit with her cousin tonight.  She describes her day by saying "it was good, I got to talk to the doctor today, I went to all the groups."  She reports her goal today was to "find something to live for."  She reports she met this goal and she is living for "me."  Pt denies SI/HI, denies hallucinations, reports right knee pain of 7/10.  Pt attended evening karaoke group where she sang 2 songs.  HS CBG was 103 mg/dL.  A: Introduced self to pt.  Supported, encouraged, and actively listened to pt.  Medications administered per order.  PRN medication administered for pain.   R: Pt is compliant with medications.  She verbally contracts for safety and reports that she will notify staff of needs/concerns.  Will continue to monitor and assess.
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
## 2015-07-20 NOTE — Plan of Care (Signed)
Problem: Alteration in mood & ability to function due to Goal: LTG-Pt reports reduction in suicidal thoughts (Patient reports reduction in suicidal thoughts and is able to verbalize a safety plan for whenever patient is feeling suicidal)  Outcome: Progressing Pt denies SI tonight.  She verbally contracts for safety.   Goal: STG-Patient will attend groups Outcome: Progressing Pt attended evening group on 07/20/15.

## 2015-07-20 NOTE — Progress Notes (Addendum)
D: Pt is alert and oriented x 4. Pt complained of severe anxiety and depression. She states, "It has been very rough, we have been asked to leave our home; I am back doing thing I shouldn't be doing; it been very rough." Pt with h/o arthritis, neuropathy, and fibromyalgia is a very high fall risked due R. leg weakness. Pt rates pain 7 a 0-10 pain scale; Pt uses a cane. Pt CBG at 2041 was 419. Pt is however hopeful; she states, "Am here now, with people like you I feel there is hope."Pt however, denies SI/HI and AVH.  Pt was very pleasant and cooperative.   A: Pt got 16 units of novolog per PA for CBG of 419. Other night time medications offered as prescribed.  Pt attended wrap-up group. Support, encouragement, and safe environment provided.  15-minute safety checks continue.  R: Pt was med compliant.  Pt attended group. Safety checks continue.

## 2015-07-20 NOTE — Progress Notes (Signed)
Patient ID: Vanessa Case, female   DOB: [birth year], 44 y.o.   MRN: [id number]  DAR: Pt. Denies HI and visual Hallucinations. Patient does endorse SI with no current plan but is able to contract for safety. She reports the main reason is because she has persucatory auditory hallucinations. Patient does report pain and right knee and received PRN Ibuprofen which did decrease the pain. Support and encouragement provided to the patient. Scheduled medications administered to patient per physician's orders. Patient is receptive and cooperative. She continues to use her cane to aid in ambulation without difficulty. She is seen in the milieu interacting with her peers appropriately and is attending groups. Q15 minute checks are maintained for safety.

## 2015-07-20 NOTE — [facility] Group Notes (Signed)
[facility] Group Notes:  (Nursing/MHT/Case Management/Adjunct)  Date:  07/20/2015  Time:  12:53 PM  Type of Therapy:  Nurse Education  Participation Level:  Active  Participation Quality:  Appropriate, Redirectable, Sharing and Supportive  Affect:  Depressed  Cognitive:  Alert  Insight:  Limited  Engagement in Group:  Engaged, Monopolizing and Supportive  Modes of Intervention:  Discussion and Education  Summary of Progress/Problems: Patient attended group and was supportive and sharing however she kept rocking back and forth in the chair throughout group. At one point patient became tearful when another patient was sharing.   Marzetta Board E 07/20/2015, 12:53 PM

## 2015-07-21 DIAGNOSIS — F1410 Cocaine abuse, uncomplicated: Secondary | ICD-10-CM

## 2015-07-21 LAB — GLUCOSE, CAPILLARY
GLUCOSE-CAPILLARY: 161 mg/dL — AB (ref 65–99)
Glucose-Capillary: 183 mg/dL — ABNORMAL HIGH (ref 65–99)
Glucose-Capillary: 99 mg/dL (ref 65–99)
Glucose-Capillary: 78 mg/dL (ref 65–99)

## 2015-07-21 LAB — LIPID PANEL
CHOLESTEROL: 220 mg/dL — AB (ref 0–200)
HDL: 45 mg/dL (ref >40)
LDL CALC: 139 mg/dL — AB (ref 0–99)
Total CHOL/HDL Ratio: 4.9 RATIO
Triglycerides: 180 mg/dL — ABNORMAL HIGH (ref <150)
VLDL: 36 mg/dL (ref 0–40)

## 2015-07-21 MED ORDER — GABAPENTIN 400 MG PO CAPS
400.0000 mg | ORAL_CAPSULE | Freq: Three times a day (TID) | ORAL | Status: DC
  Administered 2015-07-21 – 2015-07-25 (×12): 400 mg via ORAL
  Filled 2015-07-21 (×17): qty 1

## 2015-07-21 NOTE — Progress Notes (Signed)
[facility] MD Progress Note  07/21/2015 4:04 PM Vanessa Case  MRN:  [id number] Subjective:  Patient reports she is feeling better today. She does describe significant cravings for cocaine , but stresses motivation in remaining sober . Denies medication side effects. Objective : I have discussed case with treatment team and have met with patient. She is presenting with improving mood, less depression, although she does not feel she is back to baseline at this time. Reports vague hallucinations telling her to " use cocaine", but upon further clarification , describes these as " my own thoughts I can almost hear inside my head", but not actual hallucinations, and does not  Currently present internally preoccupied . She is presenting with a more reactive affect , and smiles, even laughs appropriately at times during session today. She has been visible in day room, and has been going to groups. Labs reviewed - mild hypercholesterolemia, mild hypertriglyceridemia.     Principal Problem:  MDD, Cocaine Abuse Diagnosis:   Patient Active Problem List   Diagnosis Date Noted  . Suicidal ideation [R45.851] 07/20/2015  . Major depressive disorder, recurrent, severe without psychotic features [F33.2]   . Type 2 diabetes mellitus with diabetic polyneuropathy [E11.42] 05/30/2015  . Other fatigue [R53.83] 05/30/2015  . Essential hypertension [I10] 05/30/2015  . HLD (hyperlipidemia) [E78.5] 05/30/2015  . Bipolar 2 disorder, major depressive episode [F31.81] 09/20/2014  . Cholelithiasis with acute cholecystitis [K80.00] 06/28/2013  . Right arm weakness [R29.898] 03/05/2013   Total Time spent with patient: 20 minutes    Past Medical History:  Past Medical History  Diagnosis Date  . Anemia   . GERD (gastroesophageal reflux disease)   . Depression   . Anxiety   . 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 Hypertension      maintained on lisinopril hct, metoprolol-134/86 at pat visit  . Neuropathy   . Arthritis   . Hyperlipidemia   . Neuromuscular disorder   . Substance abuse   . Chronic headache   . Fibromyalgia   . Obesity   . Chronic neck pain   . Chronic back pain   . Chronic abdominal pain     Past Surgical History  Procedure Laterality Date  . Cesarean section  x3  . Abdominal hysterectomy  10/30/2011    Procedure: HYSTERECTOMY ABDOMINAL;  Surgeon: Melina Schools, MD;  Location: [facility];  Service: Gynecology;  Laterality: N/A;  . Salpingoophorectomy  10/30/2011    Procedure: SALPINGO OOPHERECTOMY;  Surgeon: Melina Schools, MD;  Location: [facility];  Service: Gynecology;  Laterality: Right;  . Cholecystectomy N/A 06/28/2013    Procedure: LAPAROSCOPIC CHOLECYSTECTOMY;  Surgeon: Harl Bowie, MD;  Location: [facility];  Service: General;  Laterality: N/A;  . Wrist surgery      carpel tunnel and tendonitis   Family History:  Family History  Problem Relation Age of Onset  . Cystic fibrosis Father   . Diabetes Mother   . Diabetes Maternal Grandmother   . Heart disease Maternal Grandfather     Social History:  History  Alcohol Use No     History  Drug Use No    Comment: history of 04/1998-cocaine, heroin, marijuana    Social History   Social History  . Marital Status: Married    Spouse Name: N/A  . Number of Children: N/A  . Years of Education: N/A   Social History Main Topics  .  Smoking status: Current Every Day Smoker -- 0.50 packs/day    Last Attempt to Quit: 10/24/1997  . Smokeless tobacco: Never Used     Comment: Smoking 1 pp week  . Alcohol Use: No  . Drug Use: No     Comment: history of 04/1998-cocaine, heroin, marijuana  . Sexual Activity: Not Asked   Other Topics Concern  . None   Social History Narrative   Additional Social History:   Sleep: improved   Appetite:  Good  Current Medications: Current Facility-Administered Medications  Medication Dose Route Frequency  Provider Last Rate Last Dose  . alum & mag hydroxide-simeth (MAALOX/MYLANTA) 200-200-20 MG/5ML suspension 30 mL  30 mL Oral Q4H PRN Laverle Hobby, PA-C      . fluticasone (FLONASE) 50 MCG/ACT nasal spray 2 spray  2 spray Each Nare Daily Laverle Hobby, PA-C   2 spray at 07/21/15 0805  . fluticasone (FLOVENT HFA) 44 MCG/ACT inhaler 2 puff  2 puff Inhalation BID PRN Laverle Hobby, PA-C      . gabapentin (NEURONTIN) capsule 400 mg  400 mg Oral TID Jenne Campus, MD      . hydrOXYzine (ATARAX/VISTARIL) tablet 50 mg  50 mg Oral TID PRN Laverle Hobby, PA-C      . ibuprofen (ADVIL,MOTRIN) tablet 800 mg  800 mg Oral Q6H PRN Laverle Hobby, PA-C   800 mg at 07/20/15 1958  . Influenza vac split quadrivalent PF (FLUARIX) injection 0.5 mL  0.5 mL Intramuscular Tomorrow-1000 Fernando A Cobos, MD      . insulin aspart (novoLOG) injection 0-20 Units  0-20 Units Subcutaneous TID WC Laverle Hobby, PA-C   4 Units at 07/21/15 [id number]  . insulin aspart (novoLOG) injection 0-5 Units  0-5 Units Subcutaneous QHS Laverle Hobby, PA-C   0 Units at 07/19/15 2315  . insulin aspart protamine- aspart (NOVOLOG MIX 70/30) injection 65 Units  65 Units Subcutaneous BID WC Laverle Hobby, PA-C   65 Units at 07/21/15 9476  . lidocaine (LIDODERM) 5 % 1 patch  1 patch Transdermal QHS Laverle Hobby, PA-C   1 patch at 07/20/15 2230  . lisinopril (PRINIVIL,ZESTRIL) tablet 20 mg  20 mg Oral Daily Laverle Hobby, PA-C   20 mg at 07/21/15 5465  . metFORMIN (GLUCOPHAGE) tablet 1,000 mg  1,000 mg Oral BID WC Laverle Hobby, PA-C   1,000 mg at 07/21/15 0354  . mometasone-formoterol (DULERA) 200-5 MCG/ACT inhaler 2 puff  2 puff Inhalation BID Laverle Hobby, PA-C   2 puff at 07/21/15 0805  . nicotine (NICODERM CQ - dosed in mg/24 hours) patch 21 mg  21 mg Transdermal Q0600 Encarnacion Slates, NP   21 mg at 07/21/15 0810  . pantoprazole (PROTONIX) EC tablet 40 mg  40 mg Oral Daily Laverle Hobby, PA-C   40 mg at 07/21/15 6568  .  PARoxetine (PAXIL) tablet 20 mg  20 mg Oral Daily Jenne Campus, MD   20 mg at 07/21/15 0806  . pravastatin (PRAVACHOL) tablet 40 mg  40 mg Oral q1800 Laverle Hobby, PA-C   40 mg at 07/20/15 1728  . prazosin (MINIPRESS) capsule 1 mg  1 mg Oral QHS Laverle Hobby, PA-C   1 mg at 07/20/15 2158  . traZODone (DESYREL) tablet 50 mg  50 mg Oral QHS,MR X 1 Laverle Hobby, PA-C   50 mg at 07/20/15 2158    Lab Results:  Results for orders placed  or performed during the hospital encounter of 07/19/15 (from the past 48 hour(s))  Glucose, capillary     Status: Abnormal   Collection Time: 07/19/15  8:41 PM  Result Value Ref Range   Glucose-Capillary 419 (H) 65 - 99 mg/dL  Glucose, capillary     Status: Abnormal   Collection Time: 07/19/15 10:37 PM  Result Value Ref Range   Glucose-Capillary 181 (H) 65 - 99 mg/dL  Glucose, capillary     Status: Abnormal   Collection Time: 07/20/15  2:12 AM  Result Value Ref Range   Glucose-Capillary 171 (H) 65 - 99 mg/dL  Glucose, capillary     Status: Abnormal   Collection Time: 07/20/15  6:03 AM  Result Value Ref Range   Glucose-Capillary 254 (H) 65 - 99 mg/dL  Glucose, capillary     Status: None   Collection Time: 07/20/15 12:14 PM  Result Value Ref Range   Glucose-Capillary 98 65 - 99 mg/dL  Glucose, capillary     Status: Abnormal   Collection Time: 07/20/15  5:12 PM  Result Value Ref Range   Glucose-Capillary 144 (H) 65 - 99 mg/dL  Glucose, capillary     Status: Abnormal   Collection Time: 07/20/15  9:43 PM  Result Value Ref Range   Glucose-Capillary 103 (H) 65 - 99 mg/dL   Comment 1 Notify RN    Comment 2 Document in Chart   Glucose, capillary     Status: Abnormal   Collection Time: 07/21/15  6:09 AM  Result Value Ref Range   Glucose-Capillary 183 (H) 65 - 99 mg/dL   Comment 1 Notify RN    Comment 2 Document in Chart   Lipid panel, fasting     Status: Abnormal   Collection Time: 07/21/15  6:48 AM  Result Value Ref Range   Cholesterol 220  (H) 0 - 200 mg/dL   Triglycerides 180 (H) <150 mg/dL   HDL 45 >40 mg/dL   Total CHOL/HDL Ratio 4.9 RATIO   VLDL 36 0 - 40 mg/dL   LDL Cholesterol 139 (H) 0 - 99 mg/dL    Comment:        Total Cholesterol/HDL:CHD Risk Coronary Heart Disease Risk Table                     Men   Women  1/2 Average Risk   3.4   3.3  Average Risk       5.0   4.4  2 X Average Risk   9.6   7.1  3 X Average Risk  23.4   11.0        Use the calculated Patient Ratio above and the CHD Risk Table to determine the patient's CHD Risk.        ATP III CLASSIFICATION (LDL):  <100     mg/dL   Optimal  100-129  mg/dL   Near or Above                    Optimal  130-159  mg/dL   Borderline  160-189  mg/dL   High  >190     mg/dL   Very High Performed at [facility]   Glucose, capillary     Status: None   Collection Time: 07/21/15 11:56 AM  Result Value Ref Range   Glucose-Capillary 99 65 - 99 mg/dL   Comment 1 Document in Chart     Physical Findings: AIMS: Facial and Oral Movements Muscles of  Facial Expression: None, normal Lips and Perioral Area: None, normal Jaw: None, normal Tongue: None, normal,Extremity Movements Upper (arms, wrists, hands, fingers): None, normal Lower (legs, knees, ankles, toes): None, normal, Trunk Movements Neck, shoulders, hips: None, normal, Overall Severity Severity of abnormal movements (highest score from questions above): None, normal Incapacitation due to abnormal movements: None, normal Patient's awareness of abnormal movements (rate only patient's report): No Awareness, Dental Status Current problems with teeth and/or dentures?: Yes (8 eight upper teeth missing) Does patient usually wear dentures?: No  CIWA:  CIWA-Ar Total: 5 COWS:  COWS Total Score: 3  Musculoskeletal: Strength & Muscle Tone: within normal limits Gait & Station: normal Patient leans: N/A  Psychiatric Specialty Exam: ROS- denies headaches, denies chest pain, denies SOB , no vomiting    Blood pressure 146/95, pulse 121, temperature 97.9 F (36.6 C), temperature source Oral, resp. rate 20, height 5' 5.5" (1.664 m), weight 197 lb (89.359 kg), last menstrual period 10/07/2011, SpO2 100 %.Body mass index is 32.27 kg/(m^2).  General Appearance: improved grooming  Eye Contact::  Good  Speech:  Normal Rate  Volume:  Normal  Mood:  improved, less depressed   Affect:  more reactive  Thought Process:  Linear  Orientation:  Other:  fully alert and attentive   Thought Content:  denies halluciantions at this time, no delusions expressed, not internally preoccupied , does ruminate about having cravings for cocaine  Suicidal Thoughts:  No- at this time denies thoughts of hurting self or of SI  Homicidal Thoughts:  No  Memory:  recent and remote grossly intact   Judgement:  Fair  Insight:  Present  Psychomotor Activity:  Normal  Concentration:  Good  Recall:  Good  Fund of Knowledge:Good  Language: Good  Akathisia:  Negative  Handed:  Right  AIMS (if indicated):     Assets:  Desire for Improvement Resilience  ADL's:  Improved   Cognition: WNL  Sleep:  Number of Hours: 6.25  Assessment - patient improving compared to admission presentation- mood improved, affect more reactive, at this time denying any SI. Active and appropriate in milieu, tolerating medications well thus far. Experiencing ongoing cravings for cocaine, but reporting motivation in sobriety, relapse prevention. Thus far tolerating  New antidepressant trial ( Paxil) well.   Treatment Plan Summary: Daily contact with patient to assess and evaluate symptoms and progress in treatment, Medication management, Plan inpatient admission  and medications as below   Continue Neurontin 400 mgrs TID for management of pain, anxiety symptoms Continue Paxil 20 mgrs QDAY  For depression, anxiety Continue Minipress 1 mgrs QHS for nightmares  Continue Trazodone 50 mgrs QHS PRN Insomnia as needed  Continue Protonix for GERD,  Glucophage for DM management , Lisinopril for HTN Encourage ongoing group, milieu participation to work on socialization, coping skills, and symptom reduction Treatment team working on disposition options.    COBOS, Wayne 07/21/2015, 4:04 PM
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
## 2015-07-21 NOTE — Progress Notes (Signed)
D: Patient alert and oriented x 4. Patient denies pain/SI/HI/AVH. Patient came to nurses station complaining of shakiness. CBG obtained and was 78 at 1940. This Clinical research associate gave patient cup of ginger ale and a pack of graham crackers. This nurse followed up with patient and states she is feeling better and not shaking. Will continue to monitor.  A: Staff to monitor Q 15 mins for safety. Encouragement and support offered. Scheduled medications administered per orders. R: Patient remains safe on the unit. Patient attended group tonight. Patient visible on hte unit and interacting with peers. Patient taking administered medications.

## 2015-07-21 NOTE — [facility] Suicide Risk Assessment (Signed)
[facility] INPATIENT:  Family/Significant Other Suicide Prevention Education  Suicide Prevention Education:  Patient Refusal for Family/Significant Other Suicide Prevention Education: The patient Vanessa Case has refused to provide written consent for family/significant other to be provided Family/Significant Other Suicide Prevention Education during admission and/or prior to discharge.  Physician notified. SPE reviewed with patient and brochure provided. Patient encouraged to return to hospital if having suicidal thoughts, patient verbalized his/her understanding and has no further questions at this time.   Elaina Hoops 07/21/2015, 4:32 PM
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
## 2015-07-21 NOTE — Progress Notes (Signed)
Adult Psychoeducational Group Note  Date:  07/21/2015 Time:  10:04 PM  Group Topic/Focus:  Wrap-Up Group:   The focus of this group is to help patients review their daily goal of treatment and discuss progress on daily workbooks.  Participation Level:  Active  Participation Quality:  Appropriate and Attentive  Affect:  Appropriate  Cognitive:  Appropriate  Insight: Appropriate and Good  Engagement in Group:  Engaged  Modes of Intervention:  Education  Additional Comments:  Pt relapse prevention goal is to find a place to stay for her and her daughter, as of right now they are homeless.  Merlinda Frederick 07/21/2015, 10:04 PM

## 2015-07-21 NOTE — [facility] Group Notes (Signed)
[facility] LCSW Aftercare Discharge Planning Group Note  07/21/2015 8:45 AM  Participation Quality: Alert, Appropriate and Oriented  Mood/Affect: Appropriate  Depression Rating: 5  Anxiety Rating: 7  Thoughts of Suicide: Pt denies SI/HI  Will you contract for safety? Yes  Current AVH: Pt denies  Plan for Discharge/Comments: Pt attended discharge planning group and actively participated in group. CSW discussed suicide prevention education with the group and encouraged them to discuss discharge planning and any relevant barriers. Pt presents with brighter affect and expresses concern about housing situation.  Transportation Means: Pt reports access to transportation  Supports: No supports mentioned at this time  Chad Cordial, LCSWA 07/21/2015 10:09 AM

## 2015-07-21 NOTE — Progress Notes (Signed)
Patient ID: Vanessa Case, female   DOB: 07-Nov-1970, 44 y.o.   MRN: [id number]  D: Patient pleasant on approach today. Reports that she is in a good mood today. Gives depression "5", hopelessness "4", and anxiety "6". Contracts for safety on the unit today. Trying to stay positive. Her concern is housing issues.  A: Staff will monitor on q 15 minute checks, follow treatment plan, and give meds as ordered R: Cooperative on the unit.

## 2015-07-22 LAB — GLUCOSE, CAPILLARY
GLUCOSE-CAPILLARY: 63 mg/dL — AB (ref 65–99)
GLUCOSE-CAPILLARY: 83 mg/dL (ref 65–99)
GLUCOSE-CAPILLARY: 100 mg/dL — AB (ref 65–99)
Glucose-Capillary: 125 mg/dL — ABNORMAL HIGH (ref 65–99)
Glucose-Capillary: 69 mg/dL (ref 65–99)
Glucose-Capillary: 72 mg/dL (ref 65–99)
Glucose-Capillary: 88 mg/dL (ref 65–99)

## 2015-07-22 LAB — HEMOGLOBIN A1C
Hgb A1c MFr Bld: 10.7 % — ABNORMAL HIGH (ref 4.8–5.6)
Mean Plasma Glucose: 260 mg/dL

## 2015-07-22 NOTE — Progress Notes (Signed)
Hypoglycemic Event  CBG: 69  Treatment: 15 GM carbohydrate snack  Symptoms: None  Follow-up CBG: Time:1253 CBG Result:83  Possible Reasons for Event: Unknown  Comments/MD notified: Dr Jama Flavors.  Follow hypoglycemic protocol.     Vanessa Case  Remember to initiate Hypoglycemia Order Set & complete

## 2015-07-22 NOTE — Progress Notes (Signed)
The focus of this group is to educate the patient on the purpose and policies of crisis stabilization and provide a format to answer questions about their admission.  The group details unit policies and expectations of patients while admitted. Self inventory group. Patient attended this group.   

## 2015-07-22 NOTE — [facility] Group Notes (Signed)
[facility] Group Notes:  (Clinical Social Work)  07/22/2015     1:15-2:15PM  Summary of Progress/Problems:   The main focus of today's process group was to learn how to use a decisional balance exercise to move forward in the Stages of Change, which were described and discussed.  Patients listed needs on the whiteboard and unhealthy coping techniques often used to fill needs.  Motivational Interviewing and the whiteboard were utilized to help patients explore in depth the perceived benefits and costs of unhealthy coping techniques, as well as the  benefits and costs of replacing that with a healthy coping skills.  A handout was distributed for patients to be able to do this exercise for themselves.   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  She was very verbal throughout group about her issues, the importance of a sponsor in her recovery, and her feelings of being rejected and judged.  Type of Therapy:  Group Therapy - Process   Participation Level:  Active  Participation Quality:  Appropriate, Attentive, Monopolizing, Redirectable and Sharing  Affect:  Blunted and Depressed  Cognitive:  Alert and Appropriate  Insight:  Developing/Improving  Engagement in Therapy:  Engaged  Modes of Intervention:  Education, Motivational Interviewing  Ambrose Mantle, LCSW 07/22/2015, 5:11 PM

## 2015-07-22 NOTE — Progress Notes (Signed)
Hypoglycemic Event  CBG: 63  Treatment: 15 GM carbohydrate snack  Symptoms: Shaky  Follow-up CBG: Time:1236 CBG Result:69  Possible Reasons for Event: Unknown  Comments/MD notified: Dr Jama Flavors.  Follow hypoglycemic protocol.     Marion Downer  Remember to initiate Hypoglycemia Order Set & complete
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
## 2015-07-22 NOTE — Progress Notes (Addendum)
D: Patient denies SI/HI/AVH.  Patient affect is appropriate and her mood is depressed  Patient did attend group. Patient visible on the milieu. Patient had a hypoglycemic episode which was corrected using the hypoglycemic protocol.  A: Support and encouragement offered. Scheduled medications given to pt. Q 15 min checks continued for patient safety. R: Patient receptive. Patient remains safe on the unit.

## 2015-07-22 NOTE — [facility] Group Notes (Signed)
  Group Topic/Focus:  Making Healthy Choices:   The focus of this group is to help patients identify negative/unhealthy choices they were using prior to admission and identify positive/healthier coping strategies to replace them upon discharge.  Participation Level:  Active  Participation Quality:  Appropriate  Affect:  Appropriate  Cognitive:  Appropriate  Insight: Appropriate  Engagement in Group:  Engaged  Modes of Intervention:  Discussion  Additional Comments:  Patient attended the group on Making Healthy Life Choices 

## 2015-07-23 LAB — GLUCOSE, CAPILLARY
GLUCOSE-CAPILLARY: 97 mg/dL (ref 65–99)
GLUCOSE-CAPILLARY: 202 mg/dL — AB (ref 65–99)
GLUCOSE-CAPILLARY: 105 mg/dL — AB (ref 65–99)
Glucose-Capillary: 122 mg/dL — ABNORMAL HIGH (ref 65–99)

## 2015-07-23 MED ORDER — FLUCONAZOLE 100 MG PO TABS
150.0000 mg | ORAL_TABLET | Freq: Once | ORAL | Status: AC
  Administered 2015-07-23: 150 mg via ORAL
  Filled 2015-07-23: qty 2
  Filled 2015-07-23: qty 1

## 2015-07-23 NOTE — [facility] Group Notes (Signed)
[facility] Group Notes: (Clinical Social Work)   07/23/2015      Type of Therapy:  Group Therapy   Participation Level:  Did Not Attend despite MHT prompting   Ambrose Mantle, LCSW 07/23/2015, 3:34 PM
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
## 2015-07-23 NOTE — Progress Notes (Signed)
Inpatient Diabetes Program Recommendations  AACE/ADA: New Consensus Statement on Inpatient Glycemic Control (2015)  Target Ranges:  Prepandial:   less than 140 mg/dL      Peak postprandial:   less than 180 mg/dL (1-2 hours)      Critically ill patients:  140 - 180 mg/dL   Review of Glycemic Control/Consult for hypoglycemia  Diabetes history: DM 2 Outpatient Diabetes medications: 75/25 65 units BID, Metformin 1,000 mg BID Current orders for Inpatient glycemic control: 70/30 65 units BID, Metformin 1,000 mg BID, Novolog Resistant TID  Inpatient Diabetes Program Recommendations: Insulin - Basal: Patient had slight hypoglycemia of 63 mg/dl on 16/1 at 0960AV. Would watch patient for now. If patient has hypoglycemia again consider decreasing the 70/30 dose to 60-62 units BID. Patient would be covered by Novolog Correction scale if glucose trends upward after adjustment.  Note: patient is use to 75/25 at home not 70/30 which has less short acting in it and more long acting basal insulin.  Thanks,  Christena Deem RN, MSN, [facility] Inpatient Diabetes Coordinator Team Pager [phone] (8a-5p)
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
## 2015-07-23 NOTE — [facility] Group Notes (Signed)
The focus of this group is to educate the patient on the purpose and policies of crisis stabilization and provide a format to answer questions about their admission.  The group details unit policies and expectations of patients while admitted.  Patient attended 0900 nurse education orientation group this morning.  Patient actively participated and appropriate affect.  Patient was alert.  Patient had appropriate insight and appropriate engagement.  Today patient will work on 3 goals for discharge.  

## 2015-07-23 NOTE — Progress Notes (Signed)
Patient ID: Vanessa Case, female   DOB: [birth year], 44 y.o.   MRN: [id number] D: Client visible on the unit, has visitor this shift, also seen in dayroom, playing cards and interacting with peers. Client loud, irritable at one point reporting that her room door was left open while she was taking a shower one occasion and that was disrespectful. "I just want respect, is that too much to ask for" Client refusing vital signs and blood sugars at one point also. A: Writer provided emotional support, listened to client's concerns.  Encouraged client to get VS taken, so medications can be administered. Staff will monitor q65min for safety.  R: Client is safe on the unit, did not attend group.

## 2015-07-23 NOTE — Progress Notes (Signed)
D: Patient alert and oriented x 4. Patient denies pain/SI/HI/AVH. Before snack patient CBG was obtained was 72.  Patient was shaky snack was given of grape juice and graham crackers. CBG was rechecked and was 88. Patient reported she was feeling better once CBG was higher.  A: Staff to monitor Q 15 mins for safety. Encouragement and support offered. Scheduled medications administered per orders. R: Patient remains safe on the unit. Patient attended group tonight. Patient visible on hte unit and interacting with peers. Patient taking administered medications.

## 2015-07-23 NOTE — Plan of Care (Signed)
Problem: Consults Goal: Depression Patient Education See Patient Education Module for education specifics.  Outcome: Progressing Nurse discussed depression/coping skills with patient.        

## 2015-07-23 NOTE — Progress Notes (Signed)
Pt did not attend group but was invited to group.  

## 2015-07-24 ENCOUNTER — Encounter (HOSPITAL_COMMUNITY): Payer: Self-pay | Admitting: Emergency Medicine

## 2015-07-24 LAB — BASIC METABOLIC PANEL
Anion gap: 6 (ref 5–15)
BUN: 8 mg/dL (ref 6–20)
CALCIUM: 10.8 mg/dL — AB (ref 8.9–10.3)
CO2: 28 mmol/L (ref 22–32)
CREATININE: 0.77 mg/dL (ref 0.44–1.00)
Chloride: 102 mmol/L (ref 101–111)
GFR calc non Af Amer: >60 mL/min (ref >60)
GLUCOSE: 94 mg/dL (ref 65–99)
Potassium: 4.2 mmol/L (ref 3.5–5.1)
Sodium: 136 mmol/L (ref 135–145)

## 2015-07-24 LAB — GLUCOSE, CAPILLARY
GLUCOSE-CAPILLARY: 108 mg/dL — AB (ref 65–99)
GLUCOSE-CAPILLARY: 55 mg/dL — AB (ref 65–99)
GLUCOSE-CAPILLARY: 77 mg/dL (ref 65–99)
Glucose-Capillary: 108 mg/dL — ABNORMAL HIGH (ref 65–99)

## 2015-07-24 LAB — CBC
HEMATOCRIT: 35.8 % — AB (ref 36.0–46.0)
Hemoglobin: 11.6 g/dL — ABNORMAL LOW (ref 12.0–15.0)
MCH: 27.9 pg (ref 26.0–34.0)
MCHC: 32.4 g/dL (ref 30.0–36.0)
MCV: 86.1 fL (ref 78.0–100.0)
Platelets: 272 10*3/uL (ref 150–400)
RBC: 4.16 MIL/uL (ref 3.87–5.11)
RDW: 13.7 % (ref 11.5–15.5)
WBC: 8.6 10*3/uL (ref 4.0–10.5)

## 2015-07-24 LAB — I-STAT TROPONIN, ED: Troponin i, poc: 0 ng/mL (ref 0.00–0.08)

## 2015-07-24 LAB — D-DIMER, QUANTITATIVE: D-Dimer, Quant: <0.27 ug/mL-FEU (ref 0.00–0.48)

## 2015-07-24 MED ORDER — ASPIRIN 81 MG PO CHEW
324.0000 mg | CHEWABLE_TABLET | Freq: Once | ORAL | Status: AC
  Administered 2015-07-24: 324 mg via ORAL

## 2015-07-24 MED ORDER — ASPIRIN 81 MG PO CHEW
CHEWABLE_TABLET | ORAL | Status: AC
  Filled 2015-07-24: qty 4

## 2015-07-24 MED ORDER — INSULIN ASPART PROT & ASPART (70-30 MIX) 100 UNIT/ML ~~LOC~~ SUSP
60.0000 [IU] | Freq: Two times a day (BID) | SUBCUTANEOUS | Status: DC
  Administered 2015-07-24 – 2015-07-25 (×2): 60 [IU] via SUBCUTANEOUS

## 2015-07-24 MED ORDER — ASPIRIN 81 MG PO CHEW: 324.0000 mg | CHEWABLE_TABLET | Freq: Once | ORAL | Status: AC

## 2015-07-24 NOTE — Plan of Care (Signed)
Problem: Consults Goal: Depression Patient Education See Patient Education Module for education specifics.  Outcome: Progressing Nurse discussed depression/anxiety/coping skills with patient.        

## 2015-07-24 NOTE — Progress Notes (Signed)
D:  Patient's self inventory sheet, patient slept good last night, no sleep medication given.  Good appetite, high energy level, good concentration.  Rated depression 6, hopeless 4, anxiety 8.  Withdrawals, cravings.  Denied SI.  Physical problems pain, worst pain in past 24 hours #8, Knee L.  Pain medication is not helpful.  Goal is to work on hope today.  Plans to stay in groups.  Hoping that housing has been set up for daughter and patient.  No discharge plans.  Needs somewhere to live after discharge. A:  Medications administered per MD orders.  Emotional support and encouragement given patient. R:  Denied SI and HI, contracts for safety.  Denied A/V hallucinations.  Safety maintained with 15 minute checks.

## 2015-07-24 NOTE — Progress Notes (Signed)
Inpatient Diabetes Program Recommendations  AACE/ADA: New Consensus Statement on Inpatient Glycemic Control (2015)  Target Ranges:  Prepandial:   less than 140 mg/dL      Peak postprandial:   less than 180 mg/dL (1-2 hours)      Critically ill patients:  140 - 180 mg/dL    Results for Vanessa Case, Vanessa Case (MRN [id number]) as of 07/24/2015 10:52  Ref. Range 07/23/2015 06:09 07/23/2015 11:36 07/23/2015 17:09 07/23/2015 21:40  Glucose-Capillary Latest Ref Range: 65-99 mg/dL 132 (H) 97 440 (H) 102 (H)    Results for Vanessa Case, Vanessa Case (MRN [id number]) as of 07/24/2015 10:52  Ref. Range 07/24/2015 06:23  Glucose-Capillary Latest Ref Range: 65-99 mg/dL 347 (H)    Home DM Meds: Humalog 75/25 insulin- 65 units bidwc       Metformin 1000 mg bid  Current Insulin Orders: Novolog 70/30 Mix insulin- 65 units bidwc      Novolog Resistant SSI (0-20 units) TID AC + HS      Metformin 1000 mg bid    -CBGs stable yesterday 10/02 (note patient had mild Hypoglycemia at 12pm on 10/01).  -Do not recommend any insulin adjustments so far today.    ----Will follow patient during hospitalization----  Ambrose Finland RN, MSN, CDE Diabetes Coordinator Inpatient Glycemic Control Team Team Pager: [phone] (8a-5p)

## 2015-07-24 NOTE — ED Notes (Signed)
CBG is 79 

## 2015-07-24 NOTE — [facility] Group Notes (Signed)
[facility] LCSW Group Therapy  07/24/2015 1:15pm  Type of Therapy:  Group Therapy vercoming Obstacles  Participation Level:  Active  Participation Quality:  Appropriate   Affect:  Appropriate  Cognitive:  Appropriate and Oriented  Insight:  Developing/Improving and Improving  Engagement in Therapy:  Improving  Modes of Intervention:  Discussion, Exploration, Problem-solving and Support  Description of Group:   In this group patients will be encouraged to explore what they see as obstacles to their own wellness and recovery. They will be guided to discuss their thoughts, feelings, and behaviors related to these obstacles. The group will process together ways to cope with barriers, with attention given to specific choices patients can make. Each patient will be challenged to identify changes they are motivated to make in order to overcome their obstacles. This group will be process-oriented, with patients participating in exploration of their own experiences as well as giving and receiving support and challenge from other group members.  Summary of Patient Progress: Pt was less intrusive during group discussion. She identified finances an obstacle that is impeding her ability to be independent for her and her daughter. Pt expressed that it is important for her to provide a stable home for her young daughter and she feels immense pressure to do this. However, Pt expressed hope that she is on the right track as she has found somewhere to live.    Therapeutic Modalities:   Cognitive Behavioral Therapy Solution Focused Therapy Motivational Interviewing Relapse Prevention Therapy   Chad Cordial, LCSWA 07/24/2015 4:27 PM

## 2015-07-24 NOTE — Discharge Instructions (Signed)

## 2015-07-24 NOTE — ED Notes (Signed)
Spoke to charge to inquire about sitter advising that pt reports she is not currently SI.  Charge advised that there was no need for sitter.

## 2015-07-24 NOTE — Progress Notes (Signed)
Patient ID: Vanessa Case, female   DOB: Mar 09, 1971, 44 y.o.   MRN: [id number]  [facility] MD Progress Note  07/24/2015 11:56 AM Vanessa Case  MRN:  [id number] Subjective:  Patient states "I am feeling so much better today. My anxiety and depression have improved a lot. I really want to go home today".    Objective: Pt seen and chart reviewed. Pt is alert/oriented x4, calm, cooperative, and appropriate to situation. Pt denies suicidal/homicidal ideation and psychosis and does not appear to be responding to internal stimuli. Pt reports an improvement in anxiety and depression and feesl that she is ready to leave. However, pt was suicidal upon admission and does not appear to be ready to discharge given that she has not demonstrated a few days of stability. Cites good sleep and a good appetite as well. Denies medication side effects. Remains visible in day room, and has been going to groups/ participates appropriately.   *Pt had a hypoglycemic episode in the 50's for CBG. Nursing treated with the protocol, will recheck in 30 min.   Principal Problem:  MDD, Cocaine Abuse  Diagnosis:   Patient Active Problem List   Diagnosis Date Noted  . Major depressive disorder, recurrent, severe without psychotic features (HCC) [F33.2]     Priority: High  . Suicidal ideation [R45.851] 07/20/2015  . Type 2 diabetes mellitus with diabetic polyneuropathy (HCC) [E11.42] 05/30/2015  . Other fatigue [R53.83] 05/30/2015  . Essential hypertension [I10] 05/30/2015  . HLD (hyperlipidemia) [E78.5] 05/30/2015  . Bipolar 2 disorder, major depressive episode (HCC) [F31.81] 09/20/2014  . Cholelithiasis with acute cholecystitis [K80.00] 06/28/2013  . Right arm weakness [R29.898] 03/05/2013   Total Time spent with patient: 15 minutes  Past Medical History:  Past Medical History  Diagnosis Date  . Anemia   . GERD (gastroesophageal reflux disease)   . Depression   . Anxiety   . Diabetes mellitus     adult  onset dm-maintained on glipizide and metformin, pt states fasting glucose runs 110  . Hypertension     maintained on lisinopril hct, metoprolol-134/86 at pat visit  . Neuropathy   . Arthritis   . Hyperlipidemia   . Neuromuscular disorder   . Substance abuse   . Chronic headache   . Fibromyalgia   . Obesity   . Chronic neck pain   . Chronic back pain   . Chronic abdominal pain     Past Surgical History  Procedure Laterality Date  . Cesarean section  x3  . Abdominal hysterectomy  10/30/2011    Procedure: HYSTERECTOMY ABDOMINAL;  Surgeon: Bing Plume, MD;  Location: [facility];  Service: Gynecology;  Laterality: N/A;  . Salpingoophorectomy  10/30/2011    Procedure: SALPINGO OOPHERECTOMY;  Surgeon: Bing Plume, MD;  Location: [facility];  Service: Gynecology;  Laterality: Right;  . Cholecystectomy N/A 06/28/2013    Procedure: LAPAROSCOPIC CHOLECYSTECTOMY;  Surgeon: Shelly Rubenstein, MD;  Location: [facility];  Service: General;  Laterality: N/A;  . Wrist surgery      carpel tunnel and tendonitis   Family History:  Family History  Problem Relation Age of Onset  . Cystic fibrosis Father   . Diabetes Mother   . Diabetes Maternal Grandmother   . Heart disease Maternal Grandfather     Social History:  History  Alcohol Use No     History  Drug Use No    Comment: history of 04/1998-cocaine, heroin, marijuana    Social History   Social History  .  Marital Status: Married    Spouse Name: N/A  . Number of Children: N/A  . Years of Education: N/A   Social History Main Topics  . Smoking status: Current Every Day Smoker -- 0.50 packs/day    Last Attempt to Quit: 10/24/1997  . Smokeless tobacco: Never Used     Comment: Smoking 1 pp week  . Alcohol Use: No  . Drug Use: No     Comment: history of 04/1998-cocaine, heroin, marijuana  . Sexual Activity: Not Asked   Other Topics Concern  . None   Social History Narrative   Additional Social History:   Sleep: Good   Appetite:   Good  Current Medications: Current Facility-Administered Medications  Medication Dose Route Frequency Provider Last Rate Last Dose  . alum & mag hydroxide-simeth (MAALOX/MYLANTA) 200-200-20 MG/5ML suspension 30 mL  30 mL Oral Q4H PRN Kerry Hough, PA-C      . fluticasone (FLONASE) 50 MCG/ACT nasal spray 2 spray  2 spray Each Nare Daily Kerry Hough, PA-C   2 spray at 07/24/15 0801  . fluticasone (FLOVENT HFA) 44 MCG/ACT inhaler 2 puff  2 puff Inhalation BID PRN Kerry Hough, PA-C      . gabapentin (NEURONTIN) capsule 400 mg  400 mg Oral TID Craige Cotta, MD   400 mg at 07/24/15 0802  . hydrOXYzine (ATARAX/VISTARIL) tablet 50 mg  50 mg Oral TID PRN Kerry Hough, PA-C   50 mg at 07/23/15 1004  . ibuprofen (ADVIL,MOTRIN) tablet 800 mg  800 mg Oral Q6H PRN Kerry Hough, PA-C   800 mg at 07/23/15 2149  . insulin aspart (novoLOG) injection 0-20 Units  0-20 Units Subcutaneous TID WC Kerry Hough, PA-C   7 Units at 07/23/15 1727  . insulin aspart (novoLOG) injection 0-5 Units  0-5 Units Subcutaneous QHS Kerry Hough, PA-C   0 Units at 07/19/15 2315  . insulin aspart protamine- aspart (NOVOLOG MIX 70/30) injection 65 Units  65 Units Subcutaneous BID WC Kerry Hough, PA-C   65 Units at 07/24/15 0806  . lidocaine (LIDODERM) 5 % 1 patch  1 patch Transdermal QHS Kerry Hough, PA-C   1 patch at 07/23/15 2149  . lisinopril (PRINIVIL,ZESTRIL) tablet 20 mg  20 mg Oral Daily Kerry Hough, PA-C   20 mg at 07/24/15 0802  . metFORMIN (GLUCOPHAGE) tablet 1,000 mg  1,000 mg Oral BID WC Kerry Hough, PA-C   1,000 mg at 07/24/15 1610  . mometasone-formoterol (DULERA) 200-5 MCG/ACT inhaler 2 puff  2 puff Inhalation BID Kerry Hough, PA-C   2 puff at 07/24/15 0801  . nicotine (NICODERM CQ - dosed in mg/24 hours) patch 21 mg  21 mg Transdermal Q0600 Sanjuana Kava, NP   21 mg at 07/24/15 0645  . pantoprazole (PROTONIX) EC tablet 40 mg  40 mg Oral Daily Kerry Hough, PA-C   40 mg at  07/24/15 9604  . PARoxetine (PAXIL) tablet 20 mg  20 mg Oral Daily Craige Cotta, MD   20 mg at 07/24/15 0803  . pravastatin (PRAVACHOL) tablet 40 mg  40 mg Oral q1800 Kerry Hough, PA-C   40 mg at 07/23/15 1718  . prazosin (MINIPRESS) capsule 1 mg  1 mg Oral QHS Kerry Hough, PA-C   1 mg at 07/23/15 2149  . traZODone (DESYREL) tablet 50 mg  50 mg Oral QHS,MR X 1 Kerry Hough, PA-C   50 mg at 07/23/15  2150    Lab Results:  Results for orders placed or performed during the hospital encounter of 07/19/15 (from the past 48 hour(s))  Glucose, capillary     Status: Abnormal   Collection Time: 07/22/15 12:12 PM  Result Value Ref Range   Glucose-Capillary 63 (L) 65 - 99 mg/dL  Glucose, capillary     Status: None   Collection Time: 07/22/15 12:36 PM  Result Value Ref Range   Glucose-Capillary 69 65 - 99 mg/dL  Glucose, capillary     Status: None   Collection Time: 07/22/15 12:53 PM  Result Value Ref Range   Glucose-Capillary 83 65 - 99 mg/dL  Glucose, capillary     Status: Abnormal   Collection Time: 07/22/15  4:44 PM  Result Value Ref Range   Glucose-Capillary 100 (H) 65 - 99 mg/dL   Comment 1 Notify RN    Comment 2 Document in Chart   Glucose, capillary     Status: None   Collection Time: 07/22/15  8:49 PM  Result Value Ref Range   Glucose-Capillary 72 65 - 99 mg/dL   Comment 1 Notify RN   Glucose, capillary     Status: None   Collection Time: 07/22/15  9:21 PM  Result Value Ref Range   Glucose-Capillary 88 65 - 99 mg/dL  Glucose, capillary     Status: Abnormal   Collection Time: 07/23/15  6:09 AM  Result Value Ref Range   Glucose-Capillary 122 (H) 65 - 99 mg/dL   Comment 1 Notify RN   Glucose, capillary     Status: None   Collection Time: 07/23/15 11:36 AM  Result Value Ref Range   Glucose-Capillary 97 65 - 99 mg/dL   Comment 1 Notify RN    Comment 2 Document in Chart   Glucose, capillary     Status: Abnormal   Collection Time: 07/23/15  5:09 PM  Result Value  Ref Range   Glucose-Capillary 202 (H) 65 - 99 mg/dL   Comment 1 Notify RN    Comment 2 Document in Chart   Glucose, capillary     Status: Abnormal   Collection Time: 07/23/15  9:40 PM  Result Value Ref Range   Glucose-Capillary 105 (H) 65 - 99 mg/dL  Glucose, capillary     Status: Abnormal   Collection Time: 07/24/15  6:23 AM  Result Value Ref Range   Glucose-Capillary 108 (H) 65 - 99 mg/dL    Physical Findings: AIMS: Facial and Oral Movements Muscles of Facial Expression: None, normal Lips and Perioral Area: None, normal Jaw: None, normal Tongue: None, normal,Extremity Movements Upper (arms, wrists, hands, fingers): None, normal Lower (legs, knees, ankles, toes): None, normal, Trunk Movements Neck, shoulders, hips: None, normal, Overall Severity Severity of abnormal movements (highest score from questions above): None, normal Incapacitation due to abnormal movements: None, normal Patient's awareness of abnormal movements (rate only patient's report): No Awareness, Dental Status Current problems with teeth and/or dentures?: Yes Does patient usually wear dentures?: No  CIWA:  CIWA-Ar Total: 1 COWS:  COWS Total Score: 3  Musculoskeletal: Strength & Muscle Tone: within normal limits Gait & Station: normal Patient leans: N/A  Psychiatric Specialty Exam: Review of Systems  Constitutional: Negative.   HENT: Negative.   Eyes: Negative.   Respiratory: Negative.   Cardiovascular: Negative.   Gastrointestinal: Negative.   Genitourinary: Negative for dysuria, urgency, frequency, hematuria and flank pain.       Patient reports symptoms of yeast infection to include itching and "white discharge."  Skin: Negative.   Neurological: Negative.   Endo/Heme/Allergies: Negative.   Psychiatric/Behavioral: Positive for depression and substance abuse (Positive for cocaine and marijuana ). Negative for hallucinations and memory loss. The patient is nervous/anxious. The patient does not have  insomnia.   All other systems reviewed and are negative. - denies headaches, denies chest pain, denies SOB , no vomiting   Blood pressure 130/89, pulse 101, temperature 98.5 F (36.9 C), temperature source Oral, resp. rate 18, height 5' 5.5" (1.664 m), weight 89.359 kg (197 lb), last menstrual period 10/07/2011, SpO2 100 %.Body mass index is 32.27 kg/(m^2).  General Appearance: improved grooming  Eye Contact::  Good  Speech:  Normal Rate  Volume:  Normal  Mood:  Continues to improve   Affect:  Anxious , but fully reactive   Thought Process:  Linear  Orientation:  Other:  fully alert and attentive   Thought Content:  denies halluciantions at this time, no delusions expressed, not internally preoccupied , does ruminate about having cravings for cocaine  Suicidal Thoughts:  No- at this time denies thoughts of hurting self or of SI  Homicidal Thoughts:  No  Memory:  recent and remote grossly intact   Judgement:  Other:  improved   Insight:  Present  Psychomotor Activity:  Normal  Concentration:  Good  Recall:  Good  Fund of Knowledge:Good  Language: Good  Akathisia:  Negative  Handed:  Right  AIMS (if indicated):     Assets:  Desire for Improvement Resilience  ADL's:  Improved   Cognition: WNL  Sleep:  Number of Hours: 6  Assessment - she continues to improve- remains anxious and apprehensive, particularly about discharge planning- wants to go to a Rehab setting. Mood is improved, and affect is more reactive . She has been experiencing cocaine cravings but these have been subsiding . She is tolerating medications well.   *I have reviewed the plan on 07/24/15 and concur with the following changes:   Treatment Plan Summary: Daily contact with patient to assess and evaluate symptoms and progress in treatment, Medication management, Plan inpatient admission  and medications as below    Medications:  -Continue Neurontin 400 mgrs TID for management of pain, anxiety symptoms -Continue  Paxil 20 mgrs QDAY  For depression, anxiety -Continue Minipress 1 mgrs QHS for nightmares  -Continue Trazodone 50 mgrs QHS PRN Insomnia as needed  -Continue Protonix for GERD, Glucophage for DM management , Lisinopril for HTN -One time dose of Diflucan 150 mg for symptoms of yeast infection Encourage ongoing group, milieu participation to work on socialization, coping skills, and symptom reduction  *Lowered Insulin 70/30 from 65 units down to 60 units bid due to hypoglycemia. Will hold insulin sliding scale for the time being and will resume sliding scale at dinner-time.  Treatment team working on disposition options.  Have reviewed importance of staying away from/avoiding people, places, situations she associates with drug abuse after discharge , in order to minimize cravings /triggers for relapse. Diabetic Team consultation regarding medication adjustment to address episodes of hypoglycemia, which reports watching for now and decreasing 70/30 by two units if patient has another episode of hypoglycemia.   Beau Fanny, FNP-BC 07/24/2015, 11:56 AM

## 2015-07-24 NOTE — Progress Notes (Addendum)
1200  Patient's CBG was 55.  Patient drank grape juice and ate granola bars. 1215  Patient's CBG was 77.  Patient went to dining room and eating lunch. NP Constance Haw informed. Patient stated she is feeling much better.

## 2015-07-24 NOTE — [facility] Group Notes (Signed)
[facility] LCSW Aftercare Discharge Planning Group Note  07/24/2015 8:45 AM  Participation Quality: Alert, Appropriate and Oriented  Mood/Affect: Irritable  Depression Rating: 7  Anxiety Rating: 8  Thoughts of Suicide: Pt denies SI/HI  Will you contract for safety? Yes  Current AVH: Pt denies  Plan for Discharge/Comments: Pt attended discharge planning group and actively participated in group. CSW discussed suicide prevention education with the group and encouraged them to discuss discharge planning and any relevant barriers. Pt spoke at length with animation about her frustration with a staff member here on the unit. Pt expressed multiple times that she "demands respect." Pt exhibits possible symptoms of hypomania such as pressured speech and flight of ideas.  Transportation Means: Pt reports access to transportation  Supports: No supports mentioned at this time  Chad Cordial, LCSWA 07/24/2015 1:25 PM

## 2015-07-24 NOTE — ED Provider Notes (Signed)
CSN: [id number]     Arrival date & time 07/24/15  2107 History   First MD Initiated Contact with Patient 07/24/15 2120     Chief Complaint  Patient presents with  . Chest Pain     (Consider location/radiation/quality/duration/timing/severity/associated sxs/prior Treatment) HPI Comments: Sent from [facility] for chest pain.  Patient is a 44 y.o. female presenting with chest pain. The history is provided by the patient.  Chest Pain Pain location:  Substernal area Pain quality: sharp   Pain radiates to:  Does not radiate Pain radiates to the back: no   Pain severity:  Moderate Onset quality:  Sudden Timing:  Constant Chronicity:  New Context: at rest   Relieved by:  Nothing Worsened by:  Nothing tried Associated symptoms: no abdominal pain, no cough, no fever, no shortness of breath and not vomiting     Past Medical History  Diagnosis Date  . Anemia   . GERD (gastroesophageal reflux disease)   . Depression   . Anxiety   . 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 Hypertension     maintained on lisinopril hct, metoprolol-134/86 at pat visit  . Neuropathy (HCC)   . Arthritis   . Hyperlipidemia   . Neuromuscular disorder (HCC)   . Substance abuse   . Chronic headache   . Fibromyalgia   . Obesity   . Chronic neck pain   . Chronic back pain   . Chronic abdominal pain    Past Surgical History  Procedure Laterality Date  . Cesarean section  x3  . Abdominal hysterectomy  10/30/2011    Procedure: HYSTERECTOMY ABDOMINAL;  Surgeon: Bing Plume, MD;  Location: [facility];  Service: Gynecology;  Laterality: N/A;  . Salpingoophorectomy  10/30/2011    Procedure: SALPINGO OOPHERECTOMY;  Surgeon: Bing Plume, MD;  Location: [facility];  Service: Gynecology;  Laterality: Right;  . Cholecystectomy N/A 06/28/2013    Procedure: LAPAROSCOPIC CHOLECYSTECTOMY;  Surgeon: Shelly Rubenstein, MD;  Location: [facility] OR;   Service: General;  Laterality: N/A;  . Wrist surgery      carpel tunnel and tendonitis   Family History  Problem Relation Age of Onset  . Cystic fibrosis Father   . Diabetes Mother   . Diabetes Maternal Grandmother   . Heart disease Maternal Grandfather    Social History  Substance Use Topics  . Smoking status: Current Every Day Smoker -- 0.50 packs/day    Last Attempt to Quit: 10/24/1997  . Smokeless tobacco: Never Used     Comment: Smoking 1 pp week  . Alcohol Use: No   OB History    Gravida Para Term Preterm AB TAB SAB Ectopic Multiple Living   Review of Systems  Constitutional: Negative for fever.  Respiratory: Negative for cough and shortness of breath.   Cardiovascular: Positive for chest pain.  Gastrointestinal: Negative for vomiting and abdominal pain.  All other systems reviewed and are negative.     Allergies  Percocet and Ambien  Home Medications   Prior to Admission medications   Medication Sig Start Date End Date Taking? Authorizing Provider  Acetaminophen-Guaifenesin (THERAFLU FLU/CHEST CONGESTION PO) Take 20 mLs by mouth 4 (four) times daily as needed (cough).    Historical Provider, MD  FLUoxetine (PROZAC) 20 MG capsule Take 3 capsules (60 mg total) by mouth daily. Patient taking  differently: Take 80 mg by mouth daily.  09/21/14   Beau Fanny, FNP  fluticasone (FLOVENT HFA) 44 MCG/ACT inhaler Inhale 2 puffs into the lungs 2 (two) times daily as needed (for shortness of breath). 05/30/15   Ambrose Finland, NP  gabapentin (NEURONTIN) 300 MG capsule Take 1 capsule (300 mg total) by mouth 3 (three) times daily. Take 600 mg at bedtime Patient taking differently: Take 300-600 mg by mouth 4 (four) times daily. Take 600 mg at bedtime 05/30/15   Ambrose Finland, NP  HUMALOG MIX 75/25 (75-25) 100 UNIT/ML SUSP injection Inject 65 Units into the skin 2 (two) times daily. 05/29/15   Historical Provider, MD  hydrOXYzine (ATARAX/VISTARIL) 50 MG tablet Take  1 tablet (50 mg total) by mouth 3 (three) times daily as needed for anxiety. 09/21/14   Beau Fanny, FNP  ibuprofen (ADVIL,MOTRIN) 200 MG tablet Take 800 mg by mouth every 6 (six) hours as needed for moderate pain.    Historical Provider, MD  insulin NPH-regular Human (NOVOLIN 70/30) (70-30) 100 UNIT/ML injection Inject 65 Units into the skin 2 (two) times daily with a meal. Patient not taking: Reported on 07/19/2015 06/07/15   Ambrose Finland, NP  lidocaine (LIDODERM) 5 % Place 1 patch onto the skin daily. Remove & Discard patch within 12 hours or as directed by MD Apply the patch to the Right side of the neck. 02/15/15   Arthor Captain, PA-C  lisinopril (PRINIVIL,ZESTRIL) 20 MG tablet Take 1 tablet (20 mg total) by mouth daily. 05/30/15   Ambrose Finland, NP  lovastatin (MEVACOR) 40 MG tablet Take 1 tablet (40 mg total) by mouth at bedtime. 06/02/15   Ambrose Finland, NP  meclizine (ANTIVERT) 25 MG tablet Take 1 tablet (25 mg total) by mouth 3 (three) times daily as needed for dizziness. 06/07/15   Ambrose Finland, NP  meloxicam (MOBIC) 7.5 MG tablet Take 2 tablets (15 mg total) by mouth daily. 03/20/15   Jennifer Piepenbrink, PA-C  metFORMIN (GLUCOPHAGE) 500 MG tablet Take 2 tablets (1,000 mg total) by mouth 2 (two) times daily with a meal. 05/30/15   Ambrose Finland, NP  mometasone (NASONEX) 50 MCG/ACT nasal spray Place 2 sprays into the nose as needed. For congestion    Historical Provider, MD  mometasone-formoterol (DULERA) 200-5 MCG/ACT AERO Inhale 2 puffs into the lungs 2 (two) times daily as needed for wheezing.    Historical Provider, MD  omeprazole (PRILOSEC) 20 MG capsule Take 1 capsule (20 mg total) by mouth daily. 05/30/15   Ambrose Finland, NP  prazosin (MINIPRESS) 1 MG capsule Take 1 mg by mouth at bedtime. To prevent nightmares    Historical Provider, MD  Vitamin D, Ergocalciferol, (DRISDOL) 50000 UNITS CAPS capsule Take 1 capsule (50,000 Units total) by mouth every 7 (seven) days. Patient taking  differently: Take 50,000 Units by mouth every 7 (seven) days. thursday 06/02/15   Ambrose Finland, NP   BP 134/100 mmHg  Pulse 102  Temp(Src) 98.5 F (36.9 C) (Oral)  Resp 22  Ht 5' 5.5" (1.664 m)  Wt 197 lb (89.359 kg)  BMI 32.27 kg/m2  SpO2 100%  LMP 10/07/2011 Physical Exam  Constitutional: She is oriented to person, place, and time. She appears well-developed and well-nourished. No distress.  HENT:  Head: Normocephalic and atraumatic.  Mouth/Throat: Oropharynx is clear and moist.  Eyes: EOM are normal. Pupils are equal, round, and reactive to light.  Neck: Normal range of motion. Neck  supple.  Cardiovascular: Normal rate and regular rhythm.  Exam reveals no friction rub.   No murmur heard. Pulmonary/Chest: Effort normal and breath sounds normal. No respiratory distress. She has no wheezes. She has no rales.  Abdominal: Soft. She exhibits no distension. There is no tenderness. There is no rebound.  Musculoskeletal: Normal range of motion. She exhibits no edema.  Neurological: She is alert and oriented to person, place, and time.  Skin: No rash noted. She is not diaphoretic.  Nursing note and vitals reviewed.   ED Course  Procedures (including critical care time) Labs Review Labs Reviewed  GLUCOSE, CAPILLARY - Abnormal; Notable for the following:    Glucose-Capillary 419 (*)    All other components within normal limits  GLUCOSE, CAPILLARY - Abnormal; Notable for the following:    Glucose-Capillary 181 (*)    All other components within normal limits  GLUCOSE, CAPILLARY - Abnormal; Notable for the following:    Glucose-Capillary 171 (*)    All other components within normal limits  GLUCOSE, CAPILLARY - Abnormal; Notable for the following:    Glucose-Capillary 254 (*)    All other components within normal limits  HEMOGLOBIN A1C - Abnormal; Notable for the following:    Hgb A1c MFr Bld 10.7 (*)    All other components within normal limits  LIPID PANEL - Abnormal; Notable  for the following:    Cholesterol 220 (*)    Triglycerides 180 (*)    LDL Cholesterol 139 (*)    All other components within normal limits  GLUCOSE, CAPILLARY - Abnormal; Notable for the following:    Glucose-Capillary 144 (*)    All other components within normal limits  GLUCOSE, CAPILLARY - Abnormal; Notable for the following:    Glucose-Capillary 103 (*)    All other components within normal limits  GLUCOSE, CAPILLARY - Abnormal; Notable for the following:    Glucose-Capillary 183 (*)    All other components within normal limits  GLUCOSE, CAPILLARY - Abnormal; Notable for the following:    Glucose-Capillary 161 (*)    All other components within normal limits  GLUCOSE, CAPILLARY - Abnormal; Notable for the following:    Glucose-Capillary 125 (*)    All other components within normal limits  GLUCOSE, CAPILLARY - Abnormal; Notable for the following:    Glucose-Capillary 63 (*)    All other components within normal limits  GLUCOSE, CAPILLARY - Abnormal; Notable for the following:    Glucose-Capillary 100 (*)    All other components within normal limits  GLUCOSE, CAPILLARY - Abnormal; Notable for the following:    Glucose-Capillary 122 (*)    All other components within normal limits  GLUCOSE, CAPILLARY - Abnormal; Notable for the following:    Glucose-Capillary 202 (*)    All other components within normal limits  GLUCOSE, CAPILLARY - Abnormal; Notable for the following:    Glucose-Capillary 105 (*)    All other components within normal limits  GLUCOSE, CAPILLARY - Abnormal; Notable for the following:    Glucose-Capillary 108 (*)    All other components within normal limits  GLUCOSE, CAPILLARY - Abnormal; Notable for the following:    Glucose-Capillary 55 (*)    All other components within normal limits  GLUCOSE, CAPILLARY - Abnormal; Notable for the following:    Glucose-Capillary 108 (*)    All other components within normal limits  CBC - Abnormal; Notable for the  following:    Hemoglobin 11.6 (*)    HCT 35.8 (*)  All other components within normal limits  GLUCOSE, CAPILLARY  GLUCOSE, CAPILLARY  GLUCOSE, CAPILLARY  GLUCOSE, CAPILLARY  GLUCOSE, CAPILLARY  GLUCOSE, CAPILLARY  GLUCOSE, CAPILLARY  GLUCOSE, CAPILLARY  GLUCOSE, CAPILLARY  BASIC METABOLIC PANEL  D-DIMER, QUANTITATIVE (NOT AT [facility])  I-STAT TROPOININ, ED    Imaging Review No results found. I have personally reviewed and evaluated these images and lab results as part of my medical decision-making.   EKG Interpretation   Date/Time:  Monday July 24 2015 21:28:35 EDT Ventricular Rate:  87 PR Interval:  133 QRS Duration: 78 QT Interval:  366 QTC Calculation: 440 R Axis:   57 Text Interpretation:  Sinus rhythm No significant change since last  tracing Confirmed by Gwendolyn Grant  MD, Dionicio Shelnutt ([id number]) on 07/24/2015 10:13:50 PM      MDM   Final diagnoses:  Chest pain, unspecified chest pain type    44 year old female here with chest pain. Central, sharp, nonradiating. No alleviating or exacerbating factors. Began on the psych ER. No cardiac history. She is a smoker so she is not hurt negative. Was sent d-dimer. EKG and troponin are normal. Labs ok. CP free, stable for discharge.    Elwin Mocha, MD 07/25/15 [phone]

## 2015-07-24 NOTE — Progress Notes (Signed)
Patient alert and oriented x 4. Patient was complaining of chest pain 8/10 that started around noon, and progressively worsened. Patient stated pain was on left side of chest under breast radiating down to left arm and fingers. PA made aware. Fellow nurse obtain verbal orders for Aspirin 81 mg 4 tablets PO one time order,  2 liters O2 per nasal canula. EMS called for pick up and transferred to [facility].  Patient wanted this Clinical research associate to notify family member Lemar at [phone]

## 2015-07-24 NOTE — ED Notes (Signed)
Per [facility], pt was inpatient at [facility] for depression, Suicidal thoughts since Wednesday.  Today around noon she began to feel chest pain that was dull and aching however around around 5pm she reports that pain is shooting and stabbing.  The pain is going "up shoulder and down hand."  She has a hx of diabetes.  She denies pain at this time but then had "one shooting pain" as we triaged her.  She had 4 ASA in route but no nitro (no pain).  Vitals: 152/90, P96, resp:16 CBG 132.

## 2015-07-25 LAB — GLUCOSE, CAPILLARY
GLUCOSE-CAPILLARY: 69 mg/dL (ref 65–99)
GLUCOSE-CAPILLARY: 123 mg/dL — AB (ref 65–99)
Glucose-Capillary: 178 mg/dL — ABNORMAL HIGH (ref 65–99)
Glucose-Capillary: 120 mg/dL — ABNORMAL HIGH (ref 65–99)
Glucose-Capillary: 59 mg/dL — ABNORMAL LOW (ref 65–99)

## 2015-07-25 LAB — CBG MONITORING, ED: Glucose-Capillary: 74 mg/dL (ref 65–99)

## 2015-07-25 MED ORDER — LISINOPRIL 20 MG PO TABS: 20.0000 mg | ORAL_TABLET | Freq: Every day | ORAL | Status: DC

## 2015-07-25 MED ORDER — TRAZODONE HCL 50 MG PO TABS
50.0000 mg | ORAL_TABLET | Freq: Every evening | ORAL | Status: DC | PRN
  Filled 2015-07-25: qty 7

## 2015-07-25 MED ORDER — LOVASTATIN 40 MG PO TABS: 40.0000 mg | ORAL_TABLET | Freq: Every day | ORAL | Status: DC

## 2015-07-25 MED ORDER — METFORMIN HCL 500 MG PO TABS: 1000.0000 mg | ORAL_TABLET | Freq: Two times a day (BID) | ORAL | Status: DC

## 2015-07-25 MED ORDER — HUMALOG MIX 75/25 (75-25) 100 UNIT/ML ~~LOC~~ SUSP: 60.0000 [IU] | Freq: Two times a day (BID) | SUBCUTANEOUS | Status: DC

## 2015-07-25 MED ORDER — NICOTINE 21 MG/24HR TD PT24: 21.0000 mg | MEDICATED_PATCH | Freq: Every day | TRANSDERMAL | Status: DC

## 2015-07-25 MED ORDER — INSULIN ASPART 100 UNIT/ML ~~LOC~~ SOLN: 0.0000 [IU] | Freq: Three times a day (TID) | SUBCUTANEOUS | Status: DC

## 2015-07-25 MED ORDER — GABAPENTIN 400 MG PO CAPS: 400.0000 mg | ORAL_CAPSULE | Freq: Three times a day (TID) | ORAL | Status: DC

## 2015-07-25 MED ORDER — PAROXETINE HCL 20 MG PO TABS: 20.0000 mg | ORAL_TABLET | Freq: Every day | ORAL | Status: DC

## 2015-07-25 MED ORDER — TRAZODONE HCL 50 MG PO TABS: 50.0000 mg | ORAL_TABLET | Freq: Every evening | ORAL | Status: DC | PRN

## 2015-07-25 MED ORDER — PRAZOSIN HCL 1 MG PO CAPS: 1.0000 mg | ORAL_CAPSULE | Freq: Every day | ORAL | Status: DC

## 2015-07-25 MED ORDER — OMEPRAZOLE 20 MG PO CPDR: 20.0000 mg | DELAYED_RELEASE_CAPSULE | Freq: Every day | ORAL | Status: DC

## 2015-07-25 MED ORDER — HYDROXYZINE HCL 50 MG PO TABS: 50.0000 mg | ORAL_TABLET | Freq: Three times a day (TID) | ORAL | Status: DC | PRN

## 2015-07-25 MED ORDER — LIDOCAINE 5 % EX PTCH: 1.0000 | MEDICATED_PATCH | CUTANEOUS | Status: DC

## 2015-07-25 NOTE — Progress Notes (Signed)
D: Patient alert and oriented x 4. Patient returned to unit from [facility] ER at 0120. Patient denies pain/SI/HI/AVH. Patient is resting in bed with eyes closed. No sign or symptoms of distress.  A: Staff to monitor Q 15 mins for safety. Encouragement and support offered. Scheduled medications administered per orders. R: Patient remains safe on the unit Patient visible on hte unit and interacting with peers. Patient taking administered medications.

## 2015-07-25 NOTE — Progress Notes (Signed)
Patient ID: Vanessa Case, female   DOB: 01/15/1971, 44 y.o.   MRN: [id number]  Adult Psychoeducational Group Note  Date:  07/25/2015 Time: 09:00am  Group Topic/Focus:  Goals Group:   The focus of this group is to help patients establish daily goals to achieve during treatment and discuss how the patient can incorporate goal setting into their daily lives to aide in recovery.  Participation Level:  Active  Participation Quality:  Appropriate  Affect:  Appropriate  Cognitive:  Appropriate  Insight: Appropriate  Engagement in Group:  Engaged  Modes of Intervention:  Discussion, Education, Orientation and Support  Additional Comments:  Pt able to identify daily goal to accomplish with treatment team today.  Aurora Mask 07/25/2015, 9:42 AM

## 2015-07-25 NOTE — [facility] Suicide Risk Assessment (Signed)
[facility] Discharge Suicide Risk Assessment   Demographic Factors:  44 year old female,  Married , currently separated , has  Three children, plans to move in with cousin after discharge   Total Time spent with patient: 30 minutes  Musculoskeletal: Strength & Muscle Tone: within normal limits Gait & Station: normal Patient leans: N/A  Psychiatric Specialty Exam: Physical Exam  ROS  Blood pressure 130/76, pulse 104, temperature 98.4 F (36.9 C), temperature source Oral, resp. rate 18, height 5' 5.5" (1.664 m), weight 197 lb (89.359 kg), last menstrual period 10/07/2011, SpO2 100 %.Body mass index is 32.27 kg/(m^2).  General Appearance: improved grooming  Eye Contact::  Good  Speech:  Normal Rate409  Volume:  Normal  Mood:  euthymic,   Affect:  Appropriate and reactive   Thought Process:  Linear  Orientation:  Full (Time, Place, and Person)  Thought Content:  denies hallucinations, no delusions   Suicidal Thoughts:  No- denies any thoughts of  Hurting self and denies any SI   Homicidal Thoughts:  No  Memory:  recent and remote grossly intact   Judgement:   Improved   Insight:  Present  Psychomotor Activity:  Normal  Concentration:  Good  Recall:  Good  Fund of Knowledge:Good  Language: Good  Akathisia:  Negative  Handed:  Right  AIMS (if indicated):     Assets:  Desire for Improvement Resilience  Sleep:  Number of Hours: 3  Cognition: WNL  ADL's:   Improved    Have you used any form of tobacco in the last 30 days? (Cigarettes, Smokeless Tobacco, Cigars, and/or Pipes): Yes  Has this patient used any form of tobacco in the last 30 days? (Cigarettes, Smokeless Tobacco, Cigars, and/or Pipes) - we discussed benefits of smoking cessation- interested in continuing nicoderm patches to work on smoking cessation efforts.   Mental Status Per Nursing Assessment::   On Admission:     Current Mental Status by Physician: At this time patient is much improved compared to her admission-  her mood is better, currently her affect is bright, no thought disorder, no suicidal thoughts , no homicidal thoughts, she is future oriented and she is motivated in maintaining sobriety .   Loss Factors:  husband has been using cocaine and this has resulted in worsening finances and recent eviction , leading to homelessness . Recent relapse on cocaine after many years of sobriety  Historical Factors: History of depression, history of remote suicidal attempt by overdosing in 89, history of cocaine abuse . She had been sober x many years .   Risk Reduction Factors:   Responsible for children under 47 years of age, Sense of responsibility to family, Living with another person, especially a relative and Positive coping skills or problem solving skills  Continued Clinical Symptoms:  As noted, currently much improved compared to admission.   Cognitive Features That Contribute To Risk:  No gross cognitive deficits noted upon discharge. Is alert , attentive, and oriented x 3   Suicide Risk:  Mild:  Suicidal ideation of limited frequency, intensity, duration, and specificity.  There are no identifiable plans, no associated intent, mild dysphoria and related symptoms, good self-control (both objective and subjective assessment), few other risk factors, and identifiable protective factors, including available and accessible social support.  Principal Problem: Major depressive disorder, recurrent, severe without psychotic features [facility]) Discharge Diagnoses:  Patient Active Problem List   Diagnosis Date Noted  . Suicidal ideation [R45.851] 07/20/2015  . Major depressive disorder, recurrent, severe  without psychotic features (HCC) [F33.2]   . Type 2 diabetes mellitus with diabetic polyneuropathy (HCC) [E11.42] 05/30/2015  . Other fatigue [R53.83] 05/30/2015  . Essential hypertension [I10] 05/30/2015  . HLD (hyperlipidemia) [E78.5] 05/30/2015  . Bipolar 2 disorder, major depressive episode (HCC)  [F31.81] 09/20/2014  . Cholelithiasis with acute cholecystitis [K80.00] 06/28/2013  . Right arm weakness [R29.898] 03/05/2013    Follow-up Information    Follow up with Ambrose Finland, NP.   Specialty:  Internal Medicine   Contact information:   [address] [place] [address] [phone]       Follow up with [facility] On 08/17/2015.   Specialty:  Behavioral Health   Why:  at 8:00am for medication management with [place].    Contact information:   [address] [place] [address] [phone]       Plan Of Care/Follow-up recommendations:  Activity:  as tolerated  Diet:  diabetic diet  Tests:  NA Other:  see below   Is patient on multiple antipsychotic therapies at discharge:  No   Has Patient had three or more failed trials of antipsychotic monotherapy by history:  No  Recommended Plan for Multiple Antipsychotic Therapies: NA  Patient is being discharged in good spirits . Plans to go to live with supportive family member ( cousin) and with her daughter. Plans to follow up as above . [facility] up at [facility] for ongoing medical care as needed  Plans to go to 12 step meetings regularly .  Kelli Robeck 07/25/2015, 10:59 AM
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
## 2015-07-25 NOTE — Progress Notes (Signed)
  [facility] Adult Case Management Discharge Plan :  Will you be returning to the same living situation after discharge:  No. At discharge, do you have transportation home?: Yes,  Pt's mother to provide transportation Do you have the ability to pay for your medications: Yes,  Pt provided wtih prescriptions  Release of information consent forms completed and in the chart;  Patient's signature needed at discharge.  Patient to Follow up at: Follow-up Information    Follow up with Ambrose Finland, NP.   Specialty:  Internal Medicine   Contact information:   [address] [place] [address] [phone]       Follow up with [facility] On 08/17/2015.   Specialty:  Behavioral Health   Why:  at 8:00am for medication management with [place].    Contact information:   [address] [place] [address] [phone]       Patient denies SI/HI: Yes,  Pt denies    Safety Planning and Suicide Prevention discussed: Yes,  with Pt; declines family contact  Have you used any form of tobacco in the last 30 days? (Cigarettes, Smokeless Tobacco, Cigars, and/or Pipes): Yes  Has patient been referred to the Quitline?: Patient refused referral  Elaina Hoops 07/25/2015, 10:46 AM

## 2015-07-25 NOTE — Progress Notes (Signed)
Patient ID: Vanessa Case, female   DOB: [birth year], 44 y.o.   MRN: [id number]  Pt. Denies SI/HI and A/V hallucinations. Belongings returned to patient at time of discharge. Patient denies any new onset of pain or discomfort. Discharge instructions and medications were reviewed with patient. Patient verbalized understanding of both medications and discharge instructions. Patient was taken to locker to retrieve items and released to the lobby with Quintella Reichert RN. Q15 minute safety checks maintained until discharge. Patient reports she is anxious about leaving but feels ready.

## 2015-07-25 NOTE — Tx Team (Signed)
Interdisciplinary Treatment Plan Update (Adult) Date: 07/25/2015   Date: 07/25/2015 9:17 AM  Progress in Treatment:  Attending groups: Yes  Participating in groups: Yes  Taking medication as prescribed: Yes  Tolerating medication: Yes  Family/Significant othe contact made: No, Pt declines Patient understands diagnosis: Yes Discussing patient identified problems/goals with staff: Yes  Medical problems stabilized or resolved: Yes  Denies suicidal/homicidal ideation: Yes Patient has not harmed self or Others: Yes   New problem(s) identified: None identified at this time.   Discharge Plan or Barriers: CSW will assess for appropriate discharge plan and relevant barriers.   07/25/15: Pt going to live with her cousin and will follow-up at [facility].  Additional comments: n/a   Reason for Continuation of Hospitalization:  Depression Medication stabilization Suicidal ideation Withdrawal symptoms  Estimated length of stay: 0 days; stable for DC  Review of initial/current patient goals per problem list:   1.  Goal(s): Patient will participate in aftercare plan  Met:  Yes  Target date: 3-5 days from date of admission   As evidenced by: Patient will participate within aftercare plan AEB aftercare provider and housing plan at discharge being identified.  07/20/15: CSW to work with Pt to assess for appropriate discharge plan and faciliate appointments and referrals as needed prior to d/c. 07/25/15: Pt will discharge to cousin's home and follow-up with [facility]  2.  Goal (s): Patient will exhibit decreased depressive symptoms and suicidal ideations.  Met:  No  Target date: 3-5 days from date of admission   As evidenced by: Patient will utilize self rating of depression at 3 or below and demonstrate decreased signs of depression or be deemed stable for discharge by MD. 07/20/15: Pt was admitted with symptoms of depression, rating 10/10. Pt continues to present with flat affect and  depressive symptoms.  Pt will demonstrate decreased symptoms of depression and rate depression at 3/10 or lower prior to discharge. 07/25/15: Pt rates depression at 7/10; denies SI  4.  Goal(s): Patient will demonstrate decreased signs of withdrawal due to substance abuse  Met:  Goal Progressing  Target date: 3-5 days from date of admission   As evidenced by: Patient will produce a CIWA/COWS score of 0, have stable vitals signs, and no symptoms of withdrawal  07/20/15: Pt has CIWA score of 5, COWS of 3; endorses elevated pulse, anxiety, auditory disturbances as symptoms of withdrawal.     07/25/15: COWS score of 3 with elevated resting pulse rate and anxiety; CIWA score of 0     Attendees:  Patient:    Family:    Physician: Dr. Parke Poisson, MD  07/25/2015 9:17 AM  Nursing: Lars Pinks, RN Case manager  07/25/2015 9:17 AM  Clinical Social Worker Norman Clay, MSW 07/25/2015 9:17 AM  Other: Jake Bathe Liasion 07/25/2015 9:17 AM  Clinical: Gaylan Gerold, RN 07/25/2015 9:17 AM  Other: , RN Charge Nurse 07/25/2015 9:17 AM  Other:     Peri Maris, [place] MSW

## 2015-09-06 ENCOUNTER — Ambulatory Visit: Payer: Medicaid Other | Admitting: Family Medicine

## 2015-09-13 ENCOUNTER — Ambulatory Visit: Payer: Self-pay | Admitting: Family Medicine

## 2015-09-19 ENCOUNTER — Emergency Department (HOSPITAL_COMMUNITY)
Admission: EM | Admit: 2015-09-19 | Discharge: 2015-09-19 | Discharge status: Against Medical Advice | Payer: Medicaid Other | Attending: Emergency Medicine | Admitting: Emergency Medicine

## 2015-09-19 ENCOUNTER — Encounter (HOSPITAL_COMMUNITY): Payer: Self-pay | Admitting: Emergency Medicine

## 2015-09-19 DIAGNOSIS — F17200 Nicotine dependence, unspecified, uncomplicated: Secondary | ICD-10-CM | POA: Insufficient documentation

## 2015-09-19 DIAGNOSIS — E669 Obesity, unspecified: Secondary | ICD-10-CM | POA: Insufficient documentation

## 2015-09-19 DIAGNOSIS — R197 Diarrhea, unspecified: Secondary | ICD-10-CM | POA: Insufficient documentation

## 2015-09-19 DIAGNOSIS — M79604 Pain in right leg: Secondary | ICD-10-CM | POA: Insufficient documentation

## 2015-09-19 DIAGNOSIS — E119 Type 2 diabetes mellitus without complications: Secondary | ICD-10-CM | POA: Insufficient documentation

## 2015-09-19 DIAGNOSIS — M25512 Pain in left shoulder: Secondary | ICD-10-CM | POA: Insufficient documentation

## 2015-09-19 DIAGNOSIS — I10 Essential (primary) hypertension: Secondary | ICD-10-CM | POA: Insufficient documentation

## 2015-09-19 DIAGNOSIS — G8929 Other chronic pain: Secondary | ICD-10-CM | POA: Insufficient documentation

## 2015-09-19 DIAGNOSIS — M25561 Pain in right knee: Secondary | ICD-10-CM | POA: Insufficient documentation

## 2015-09-19 LAB — CBC
HCT: 38.4 % (ref 36.0–46.0)
Hemoglobin: 12.7 g/dL (ref 12.0–15.0)
MCH: 28.1 pg (ref 26.0–34.0)
MCHC: 33.1 g/dL (ref 30.0–36.0)
MCV: 85 fL (ref 78.0–100.0)
PLATELETS: 311 10*3/uL (ref 150–400)
RBC: 4.52 MIL/uL (ref 3.87–5.11)
RDW: 13.7 % (ref 11.5–15.5)
WBC: 9.8 10*3/uL (ref 4.0–10.5)

## 2015-09-19 LAB — COMPREHENSIVE METABOLIC PANEL
ALK PHOS: 59 U/L (ref 38–126)
ALT: 25 U/L (ref 14–54)
AST: 26 U/L (ref 15–41)
Albumin: 4.5 g/dL (ref 3.5–5.0)
Anion gap: 8 (ref 5–15)
BILIRUBIN TOTAL: 0.4 mg/dL (ref 0.3–1.2)
BUN: 7 mg/dL (ref 6–20)
CO2: 27 mmol/L (ref 22–32)
CREATININE: 0.69 mg/dL (ref 0.44–1.00)
Calcium: 10.9 mg/dL — ABNORMAL HIGH (ref 8.9–10.3)
Chloride: 105 mmol/L (ref 101–111)
GFR calc Af Amer: >60 mL/min (ref >60)
GLUCOSE: 132 mg/dL — AB (ref 65–99)
Potassium: 4 mmol/L (ref 3.5–5.1)
Sodium: 140 mmol/L (ref 135–145)
TOTAL PROTEIN: 8.2 g/dL — AB (ref 6.5–8.1)

## 2015-09-19 LAB — CBG MONITORING, ED
GLUCOSE-CAPILLARY: 95 mg/dL (ref 65–99)
Glucose-Capillary: 61 mg/dL — ABNORMAL LOW (ref 65–99)

## 2015-09-19 LAB — LIPASE, BLOOD: Lipase: 22 U/L (ref 11–51)

## 2015-09-19 NOTE — ED Notes (Signed)
Pt request for something to eat and state she is starting to shake. Pt is upset due to having to wait in waiting area. Request for staff to check sugar, sugar was check gave PT OJ and crackers. Info for PT to go back to waiting room due to no beds in back

## 2015-09-19 NOTE — ED Notes (Signed)
Gave pt graham crackers and a diet Sprite, per Jill PolingJessica B. - RN

## 2015-09-19 NOTE — ED Notes (Signed)
Pt CBG result was 95. Informed Jessica - RN.

## 2015-09-19 NOTE — ED Notes (Signed)
No answer x3

## 2015-09-19 NOTE — ED Notes (Signed)
Called for room, no answer x1 

## 2015-09-19 NOTE — ED Notes (Addendum)
Per EMS-right leg and left shoulder pain-history of fibromyalgia-states she said she was having diarrhea too and stopped taking meds because she "didnt have anything on her stomach because of the diarrhea"-no trauma, was ambulatory at scene

## 2015-09-19 NOTE — ED Notes (Signed)
No answer x2 

## 2015-09-19 NOTE — ED Notes (Signed)
Pt sts right knee pain and left shoulder pain; [facility] but sts upset due to no food being given and her CBG dropping; pt sts some diarrhea at times; pt requesting food

## 2015-09-25 ENCOUNTER — Ambulatory Visit: Payer: Self-pay

## 2015-10-17 ENCOUNTER — Other Ambulatory Visit (INDEPENDENT_AMBULATORY_CARE_PROVIDER_SITE_OTHER): Payer: Self-pay | Admitting: Family

## 2016-03-14 ENCOUNTER — Encounter (HOSPITAL_COMMUNITY): Payer: Self-pay | Admitting: Emergency Medicine

## 2016-03-14 ENCOUNTER — Emergency Department (HOSPITAL_COMMUNITY)
Admission: EM | Admit: 2016-03-14 | Discharge: 2016-03-14 | Disposition: A | Discharge status: Home / Self Care | Payer: Medicaid Other | Attending: Emergency Medicine | Admitting: Emergency Medicine

## 2016-03-14 DIAGNOSIS — E785 Hyperlipidemia, unspecified: Secondary | ICD-10-CM | POA: Insufficient documentation

## 2016-03-14 DIAGNOSIS — Z79899 Other long term (current) drug therapy: Secondary | ICD-10-CM | POA: Insufficient documentation

## 2016-03-14 DIAGNOSIS — E1165 Type 2 diabetes mellitus with hyperglycemia: Secondary | ICD-10-CM | POA: Insufficient documentation

## 2016-03-14 DIAGNOSIS — Z9114 Patient's other noncompliance with medication regimen: Secondary | ICD-10-CM

## 2016-03-14 DIAGNOSIS — E669 Obesity, unspecified: Secondary | ICD-10-CM | POA: Insufficient documentation

## 2016-03-14 DIAGNOSIS — G8929 Other chronic pain: Secondary | ICD-10-CM | POA: Insufficient documentation

## 2016-03-14 DIAGNOSIS — R739 Hyperglycemia, unspecified: Secondary | ICD-10-CM

## 2016-03-14 DIAGNOSIS — Z7984 Long term (current) use of oral hypoglycemic drugs: Secondary | ICD-10-CM | POA: Insufficient documentation

## 2016-03-14 DIAGNOSIS — E1140 Type 2 diabetes mellitus with diabetic neuropathy, unspecified: Secondary | ICD-10-CM | POA: Insufficient documentation

## 2016-03-14 DIAGNOSIS — F17200 Nicotine dependence, unspecified, uncomplicated: Secondary | ICD-10-CM | POA: Insufficient documentation

## 2016-03-14 DIAGNOSIS — Z794 Long term (current) use of insulin: Secondary | ICD-10-CM | POA: Insufficient documentation

## 2016-03-14 DIAGNOSIS — F329 Major depressive disorder, single episode, unspecified: Secondary | ICD-10-CM | POA: Insufficient documentation

## 2016-03-14 DIAGNOSIS — I10 Essential (primary) hypertension: Secondary | ICD-10-CM | POA: Insufficient documentation

## 2016-03-14 DIAGNOSIS — E1142 Type 2 diabetes mellitus with diabetic polyneuropathy: Secondary | ICD-10-CM

## 2016-03-14 DIAGNOSIS — M25512 Pain in left shoulder: Secondary | ICD-10-CM | POA: Insufficient documentation

## 2016-03-14 LAB — CBG MONITORING, ED: GLUCOSE-CAPILLARY: 289 mg/dL — AB (ref 65–99)

## 2016-03-14 LAB — CBC WITH DIFFERENTIAL/PLATELET
Basophils Absolute: 0 10*3/uL (ref 0.0–0.1)
Basophils Relative: 0 %
Eosinophils Absolute: 0.1 10*3/uL (ref 0.0–0.7)
Eosinophils Relative: 1 %
HEMATOCRIT: 41.6 % (ref 36.0–46.0)
HEMOGLOBIN: 13.7 g/dL (ref 12.0–15.0)
LYMPHS ABS: 3.4 10*3/uL (ref 0.7–4.0)
LYMPHS PCT: 39 %
MCH: 27.5 pg (ref 26.0–34.0)
MCHC: 32.9 g/dL (ref 30.0–36.0)
MCV: 83.5 fL (ref 78.0–100.0)
Monocytes Absolute: 0.4 10*3/uL (ref 0.1–1.0)
Monocytes Relative: 4 %
NEUTROS ABS: 4.9 10*3/uL (ref 1.7–7.7)
NEUTROS PCT: 56 %
Platelets: 286 10*3/uL (ref 150–400)
RBC: 4.98 MIL/uL (ref 3.87–5.11)
RDW: 14 % (ref 11.5–15.5)
WBC: 8.8 10*3/uL (ref 4.0–10.5)

## 2016-03-14 LAB — COMPREHENSIVE METABOLIC PANEL
ALT: 25 U/L (ref 14–54)
AST: 21 U/L (ref 15–41)
Albumin: 4 g/dL (ref 3.5–5.0)
Alkaline Phosphatase: 89 U/L (ref 38–126)
Anion gap: 8 (ref 5–15)
BUN: <5 mg/dL — ABNORMAL LOW (ref 6–20)
CHLORIDE: 100 mmol/L — AB (ref 101–111)
CO2: 28 mmol/L (ref 22–32)
Calcium: 10.5 mg/dL — ABNORMAL HIGH (ref 8.9–10.3)
Creatinine, Ser: 0.58 mg/dL (ref 0.44–1.00)
Glucose, Bld: 289 mg/dL — ABNORMAL HIGH (ref 65–99)
POTASSIUM: 4.1 mmol/L (ref 3.5–5.1)
SODIUM: 136 mmol/L (ref 135–145)
Total Bilirubin: 0.3 mg/dL (ref 0.3–1.2)
Total Protein: 6.9 g/dL (ref 6.5–8.1)

## 2016-03-14 LAB — URINALYSIS, ROUTINE W REFLEX MICROSCOPIC
Bilirubin Urine: NEGATIVE
GLUCOSE, UA: 500 mg/dL — AB
HGB URINE DIPSTICK: NEGATIVE
Ketones, ur: NEGATIVE mg/dL
Leukocytes, UA: NEGATIVE
Nitrite: NEGATIVE
Protein, ur: NEGATIVE mg/dL
SPECIFIC GRAVITY, URINE: 1.03 (ref 1.005–1.030)
pH: 8 (ref 5.0–8.0)

## 2016-03-14 MED ORDER — OMEPRAZOLE 20 MG PO CPDR: 20.0000 mg | DELAYED_RELEASE_CAPSULE | Freq: Every day | ORAL | Status: DC

## 2016-03-14 MED ORDER — METFORMIN HCL 500 MG PO TABS: 1000.0000 mg | ORAL_TABLET | Freq: Two times a day (BID) | ORAL | Status: DC

## 2016-03-14 MED ORDER — LISINOPRIL 20 MG PO TABS: 20.0000 mg | ORAL_TABLET | Freq: Every day | ORAL | Status: DC

## 2016-03-14 MED ORDER — FLUTICASONE PROPIONATE HFA 44 MCG/ACT IN AERO: 2.0000 | INHALATION_SPRAY | Freq: Two times a day (BID) | RESPIRATORY_TRACT | Status: AC | PRN

## 2016-03-14 MED ORDER — GABAPENTIN 400 MG PO CAPS: 400.0000 mg | ORAL_CAPSULE | Freq: Three times a day (TID) | ORAL | Status: DC

## 2016-03-14 NOTE — Discharge Instructions (Signed)
Hyperglycemia °Hyperglycemia occurs when the glucose (sugar) in your blood is too high. Hyperglycemia can happen for many reasons, but it most often happens to people who do not know they have diabetes or are not managing their diabetes properly.  °CAUSES  °Whether you have diabetes or not, there are other causes of hyperglycemia. Hyperglycemia can occur when you have diabetes, but it can also occur in other situations that you might not be as aware of, such as: °Diabetes °· If you have diabetes and are having problems controlling your blood glucose, hyperglycemia could occur because of some of the following reasons: °· Not following your meal plan. °· Not taking your diabetes medications or not taking it properly. °· Exercising less or doing less activity than you normally do. °· Being sick. °Pre-diabetes °· This cannot be ignored. Before people develop Type 2 diabetes, they almost always have "pre-diabetes." This is when your blood glucose levels are higher than normal, but not yet high enough to be diagnosed as diabetes. Research has shown that some long-term damage to the body, especially the heart and circulatory system, may already be occurring during pre-diabetes. If you take action to manage your blood glucose when you have pre-diabetes, you may delay or prevent Type 2 diabetes from developing. °Stress °· If you have diabetes, you may be "diet" controlled or on oral medications or insulin to control your diabetes. However, you may find that your blood glucose is higher than usual in the hospital whether you have diabetes or not. This is often referred to as "stress hyperglycemia." Stress can elevate your blood glucose. This happens because of hormones put out by the body during times of stress. If stress has been the cause of your high blood glucose, it can be followed regularly by your caregiver. That way he/she can make sure your hyperglycemia does not continue to get worse or progress to  diabetes. °Steroids °· Steroids are medications that act on the infection fighting system (immune system) to block inflammation or infection. One side effect can be a rise in blood glucose. Most people can produce enough extra insulin to allow for this rise, but for those who cannot, steroids make blood glucose levels go even higher. It is not unusual for steroid treatments to "uncover" diabetes that is developing. It is not always possible to determine if the hyperglycemia will go away after the steroids are stopped. A special blood test called an A1c is sometimes done to determine if your blood glucose was elevated before the steroids were started. °SYMPTOMS °· Thirsty. °· Frequent urination. °· Dry mouth. °· Blurred vision. °· Tired or fatigue. °· Weakness. °· Sleepy. °· Tingling in feet or leg. °DIAGNOSIS  °Diagnosis is made by monitoring blood glucose in one or all of the following ways: °· A1c test. This is a chemical found in your blood. °· Fingerstick blood glucose monitoring. °· Laboratory results. °TREATMENT  °First, knowing the cause of the hyperglycemia is important before the hyperglycemia can be treated. Treatment may include, but is not be limited to: °· Education. °· Change or adjustment in medications. °· Change or adjustment in meal plan. °· Treatment for an illness, infection, etc. °· More frequent blood glucose monitoring. °· Change in exercise plan. °· Decreasing or stopping steroids. °· Lifestyle changes. °HOME CARE INSTRUCTIONS  °· Test your blood glucose as directed. °· Exercise regularly. Your caregiver will give you instructions about exercise. Pre-diabetes or diabetes which comes on with stress is helped by exercising. °· Eat wholesome,   balanced meals. Eat often and at regular, fixed times. Your caregiver or nutritionist will give you a meal plan to guide your sugar intake. °· Being at an ideal weight is important. If needed, losing as little as 10 to 15 pounds may help improve blood  glucose levels. °SEEK MEDICAL CARE IF:  °· You have questions about medicine, activity, or diet. °· You continue to have symptoms (problems such as increased thirst, urination, or weight gain). °SEEK IMMEDIATE MEDICAL CARE IF:  °· You are vomiting or have diarrhea. °· Your breath smells fruity. °· You are breathing faster or slower. °· You are very sleepy or incoherent. °· You have numbness, tingling, or pain in your feet or hands. °· You have chest pain. °· Your symptoms get worse even though you have been following your caregiver's orders. °· If you have any other questions or concerns. °  °This information is not intended to replace advice given to you by your health care provider. Make sure you discuss any questions you have with your health care provider. °  °Document Released: 04/02/2001 Document Revised: 12/30/2011 Document Reviewed: 06/13/2015 °Elsevier Interactive Patient Education ©2016 [employer] ° °Hypertension °Hypertension, commonly called high blood pressure, is when the force of blood pumping through your arteries is too strong. Your arteries are the blood vessels that carry blood from your heart throughout your body. A blood pressure reading consists of a higher number over a lower number, such as 110/72. The higher number (systolic) is the pressure inside your arteries when your heart pumps. The lower number (diastolic) is the pressure inside your arteries when your heart relaxes. Ideally you want your blood pressure below 120/80. °Hypertension forces your heart to work harder to pump blood. Your arteries may become narrow or stiff. Having untreated or uncontrolled hypertension can cause heart attack, stroke, kidney disease, and other problems. °RISK FACTORS °Some risk factors for high blood pressure are controllable. Others are not.  °Risk factors you cannot control include:  °· Race. You may be at higher risk if you are African American. °· Age. Risk increases with age. °· Gender. Men are at  higher risk than women before age 45 years. After age 65, women are at higher risk than men. °Risk factors you can control include: °· Not getting enough exercise or physical activity. °· Being overweight. °· Getting too much fat, sugar, calories, or salt in your diet. °· Drinking too much alcohol. °SIGNS AND SYMPTOMS °Hypertension does not usually cause signs or symptoms. Extremely high blood pressure (hypertensive crisis) may cause headache, anxiety, shortness of breath, and nosebleed. °DIAGNOSIS °To check if you have hypertension, your health care provider will measure your blood pressure while you are seated, with your arm held at the level of your heart. It should be measured at least twice using the same arm. Certain conditions can cause a difference in blood pressure between your right and left arms. A blood pressure reading that is higher than normal on one occasion does not mean that you need treatment. If it is not clear whether you have high blood pressure, you may be asked to return on a different day to have your blood pressure checked again. Or, you may be asked to monitor your blood pressure at home for 1 or more weeks. °TREATMENT °Treating high blood pressure includes making lifestyle changes and possibly taking medicine. Living a healthy lifestyle can help lower high blood pressure. You may need to change some of your habits. °Lifestyle changes may include: °·   Following the DASH diet. This diet is high in fruits, vegetables, and whole grains. It is low in salt, red meat, and added sugars. °· Keep your sodium intake below 2,300 mg per day. °· Getting at least 30-45 minutes of aerobic exercise at least 4 times per week. °· Losing weight if necessary. °· Not smoking. °· Limiting alcoholic beverages. °· Learning ways to reduce stress. °Your health care provider may prescribe medicine if lifestyle changes are not enough to get your blood pressure under control, and if one of the following is true: °· You  are 18-59 years of age and your systolic blood pressure is above 140. °· You are 60 years of age or older, and your systolic blood pressure is above 150. °· Your diastolic blood pressure is above 90. °· You have diabetes, and your systolic blood pressure is over 140 or your diastolic blood pressure is over 90. °· You have kidney disease and your blood pressure is above 140/90. °· You have heart disease and your blood pressure is above 140/90. °Your personal target blood pressure may vary depending on your medical conditions, your age, and other factors. °HOME CARE INSTRUCTIONS °· Have your blood pressure rechecked as directed by your health care provider.   °· Take medicines only as directed by your health care provider. Follow the directions carefully. Blood pressure medicines must be taken as prescribed. The medicine does not work as well when you skip doses. Skipping doses also puts you at risk for problems. °· Do not smoke.   °· Monitor your blood pressure at home as directed by your health care provider.  °SEEK MEDICAL CARE IF:  °· You think you are having a reaction to medicines taken. °· You have recurrent headaches or feel dizzy. °· You have swelling in your ankles. °· You have trouble with your vision. °SEEK IMMEDIATE MEDICAL CARE IF: °· You develop a severe headache or confusion. °· You have unusual weakness, numbness, or feel faint. °· You have severe chest or abdominal pain. °· You vomit repeatedly. °· You have trouble breathing. °MAKE SURE YOU:  °· Understand these instructions. °· Will watch your condition. °· Will get help right away if you are not doing well or get worse. °  °This information is not intended to replace advice given to you by your health care provider. Make sure you discuss any questions you have with your health care provider. °  °Document Released: 10/07/2005 Document Revised: 02/21/2015 Document Reviewed: 07/30/2013 °Elsevier Interactive Patient Education ©2016 Else[employer] Inc. ° °

## 2016-03-14 NOTE — ED Provider Notes (Signed)
CSN: [id number]     Arrival date & time 03/14/16  [id number] History   First MD Initiated Contact with Patient 03/14/16 [phone]     Chief Complaint  Patient presents with  . Hyperglycemia  . Shoulder Pain     Patient is a 45 y.o. female presenting with hyperglycemia and shoulder pain. The history is provided by the patient.  Hyperglycemia Associated symptoms: polyuria   Associated symptoms: no abdominal pain, no chest pain, no nausea, no shortness of breath, no vomiting and no weakness   Shoulder Pain Associated symptoms: no back pain   Patient presents with left shoulder pain that is acute on chronic. Has had pain for years but over the last 8 days more severe. No new trauma. No fevers or chills. No cough. States she was seen a doctor for the past but has been pretty well controlled up until recently. She's also had high blood sugars. States her blood sugars been running 300. She's been off her medications for the last 3-4 months due to financial issues. States she is not able see the doctor. She is urinating more freely. No headaches. No confusion. States that her left shoulder were also because of the sugars being high. She also had intentionally lost weight so thinks she may have needed less medicines overall.   Past Medical History  Diagnosis Date  . Anemia   . GERD (gastroesophageal reflux disease)   . Depression   . Anxiety   . 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 Hypertension     maintained on lisinopril hct, metoprolol-134/86 at pat visit  . Neuropathy (HCC)   . Arthritis   . Hyperlipidemia   . Neuromuscular disorder (HCC)   . Substance abuse   . Chronic headache   . Fibromyalgia   . Obesity   . Chronic neck pain   . Chronic back pain   . Chronic abdominal pain    Past Surgical History  Procedure Laterality Date  . Cesarean section  x3  . Abdominal hysterectomy  10/30/2011    Procedure:  HYSTERECTOMY ABDOMINAL;  Surgeon: Bing Plumehomas F Henley, MD;  Location: [facility];  Service: Gynecology;  Laterality: N/A;  . Salpingoophorectomy  10/30/2011    Procedure: SALPINGO OOPHERECTOMY;  Surgeon: Bing Plumehomas F Henley, MD;  Location: [facility];  Service: Gynecology;  Laterality: Right;  . Cholecystectomy N/A 06/28/2013    Procedure: LAPAROSCOPIC CHOLECYSTECTOMY;  Surgeon: Shelly Rubensteinouglas A Blackman, MD;  Location: [facility];  Service: General;  Laterality: N/A;  . Wrist surgery      carpel tunnel and tendonitis   Family History  Problem Relation Age of Onset  . Cystic fibrosis Father   . Diabetes Mother   . Diabetes Maternal Grandmother   . Heart disease Maternal Grandfather    Social History  Substance Use Topics  . Smoking status: Current Every Day Smoker -- 0.50 packs/day    Last Attempt to Quit: 10/24/1997  . Smokeless tobacco: Never Used     Comment: Smoking 1 pp week  . Alcohol Use: No   OB History    Gravida Para Term Preterm AB TAB SAB Ectopic Multiple Living   3 3 3       3      Review of Systems  Constitutional: Negative for activity change and appetite change.  Eyes: Negative for pain.  Respiratory: Negative for chest tightness and shortness of breath.   Cardiovascular: Negative for chest  pain and leg swelling.  Gastrointestinal: Negative for nausea, vomiting, abdominal pain and diarrhea.  Endocrine: Positive for polyphagia and polyuria.  Genitourinary: Negative for flank pain.  Musculoskeletal: Negative for back pain and neck stiffness.       Left shoulder pain  Skin: Negative for rash.  Neurological: Negative for weakness, numbness and headaches.  Psychiatric/Behavioral: Negative for behavioral problems.      Allergies  Percocet and Ambien  Home Medications   Prior to Admission medications   Medication Sig Start Date End Date Taking? Authorizing Provider  FLUoxetine (PROZAC) 20 MG capsule Take 20 mg by mouth daily.   Yes Historical Provider, MD  prazosin (MINIPRESS) 1 MG  capsule Take 1 capsule (1 mg total) by mouth at bedtime. To prevent nightmares 07/25/15  Yes Beau Fanny, FNP  traZODone (DESYREL) 50 MG tablet Take 1 tablet (50 mg total) by mouth at bedtime as needed for sleep. Patient taking differently: Take 100 mg by mouth at bedtime.  07/25/15  Yes Beau Fanny, FNP  fluticasone (FLOVENT HFA) 44 MCG/ACT inhaler Inhale 2 puffs into the lungs 2 (two) times daily as needed (for shortness of breath). 03/14/16   Benjiman Core, MD  gabapentin (NEURONTIN) 400 MG capsule Take 1 capsule (400 mg total) by mouth 3 (three) times daily. 03/14/16   Benjiman Core, MD  HUMALOG MIX 75/25 (75-25) 100 UNIT/ML SUSP injection Inject 60 Units into the skin 2 (two) times daily. Patient not taking: Reported on 03/14/2016 07/25/15   Beau Fanny, FNP  hydrOXYzine (ATARAX/VISTARIL) 50 MG tablet Take 1 tablet (50 mg total) by mouth 3 (three) times daily as needed for anxiety. Patient not taking: Reported on 03/14/2016 07/25/15   Beau Fanny, FNP  insulin aspart (NOVOLOG) 100 UNIT/ML injection Inject 0-20 Units into the skin 3 (three) times daily with meals. USE SLIDING SCALE AT HOME Patient not taking: Reported on 03/14/2016 07/25/15   Beau Fanny, FNP  lidocaine (LIDODERM) 5 % Place 1 patch onto the skin daily. Remove & Discard patch within 12 hours or as directed by MD Apply the patch to the Right side of the neck. Patient not taking: Reported on 03/14/2016 07/25/15   Beau Fanny, FNP  lisinopril (PRINIVIL,ZESTRIL) 20 MG tablet Take 1 tablet (20 mg total) by mouth daily. 03/14/16   Benjiman Core, MD  lovastatin (MEVACOR) 40 MG tablet Take 1 tablet (40 mg total) by mouth at bedtime. Patient not taking: Reported on 03/14/2016 07/25/15   Beau Fanny, FNP  metFORMIN (GLUCOPHAGE) 500 MG tablet Take 2 tablets (1,000 mg total) by mouth 2 (two) times daily with a meal. 03/14/16   Benjiman Core, MD  nicotine (NICODERM CQ - DOSED IN MG/24 HOURS) 21 mg/24hr patch Place 1 patch  (21 mg total) onto the skin daily at 6 (six) AM. Patient not taking: Reported on 03/14/2016 07/25/15   Beau Fanny, FNP  omeprazole (PRILOSEC) 20 MG capsule Take 1 capsule (20 mg total) by mouth daily. 03/14/16   Benjiman Core, MD  PARoxetine (PAXIL) 20 MG tablet Take 1 tablet (20 mg total) by mouth daily. Patient not taking: Reported on 03/14/2016 07/25/15   Beau Fanny, FNP   BP 159/92 mmHg  Pulse 77  Temp(Src) 98.2 F (36.8 C) (Oral)  Resp 16  SpO2 100%  LMP 10/07/2011 Physical Exam  Constitutional: She appears well-developed.  HENT:  Head: Atraumatic.  Neck: Neck supple.  Cardiovascular: Normal rate.   Pulmonary/Chest: Effort normal.  Abdominal: Soft. There  is no tenderness.  Musculoskeletal: Normal range of motion. She exhibits tenderness.  Overall good range of motion left shoulder. Some tenderness anteriorly. Sharp severe pain. No rash. neuro vascular intact in left hand.  Neurological: She is alert.  Skin: Skin is warm.    ED Course  Procedures (including critical care time) Labs Review Labs Reviewed  COMPREHENSIVE METABOLIC PANEL - Abnormal; Notable for the following:    Chloride 100 (*)    Glucose, Bld 289 (*)    BUN <5 (*)    Calcium 10.5 (*)    All other components within normal limits  URINALYSIS, ROUTINE W REFLEX MICROSCOPIC (NOT AT [facility]) - Abnormal; Notable for the following:    Glucose, UA 500 (*)    All other components within normal limits  CBG MONITORING, ED - Abnormal; Notable for the following:    Glucose-Capillary 289 (*)    All other components within normal limits  CBC WITH DIFFERENTIAL/PLATELET    Imaging Review No results found. I have personally reviewed and evaluated these images and lab results as part of my medical decision-making.   EKG Interpretation None      MDM   Final diagnoses:  Hyperglycemia  Noncompliance with medications  Chronic shoulder pain, left    Patient with hyperglycemia occasionally noncompliance left  shoulder pain. Shoulder pain is acute on chronic. Dumping imaging of the shoulder would help at this time. Will squeeze to the patient's medications. Has been seen by case management that has helped arrange follow-up and [facility] going to arrange follow-up and she will be discharged.    Benjiman Core, MD 03/14/16 [phone]

## 2016-03-14 NOTE — Progress Notes (Signed)
Spoke to patient regarding primary care resources and [facility]. Patient sts she was [facility] but could never obtain an appointment. Pt sts she filed a Theatre managergrievance with the practice and does not want to return for care. Patient was informed to pick up discharge medications from the [facility] pharmacy today. Appointment scheduled for Wednesday May 31,2017 @ 10:00am with me to obtain the orange card and to be assigned a new pcp. Orange card application provided and explained. My contact information also given for any future questions or concerns. No other [facility] Specialist needs identified at this time.  Vanessa DutyFelicia Case [id number] [phone]

## 2016-03-14 NOTE — ED Notes (Signed)
Patient complains of hyperglycemia and L shoulder pain.   Patient states injured shoulder in 2008, but has worsened pain in the last 4 weeks.   Patient states CBGs have been running in the 300s lately and her blood pressure has been high due to not taking her medications.

## 2016-03-29 MED FILL — metFORMIN HCL 500 MG TABS: 500 | 30 days supply | Qty: 60 | Fill #0

## 2016-03-29 MED FILL — ?LISINOPRIL 20 MG TABLET: 20 | 30 days supply | Qty: 30 | Fill #0

## 2016-03-29 MED FILL — ?OMEPRAZOLE DR 20 MG CAPSUL: 20 | 30 days supply | Qty: 30 | Fill #0

## 2016-05-14 ENCOUNTER — Encounter (HOSPITAL_COMMUNITY): Payer: Self-pay | Admitting: Vascular Surgery

## 2016-05-14 ENCOUNTER — Emergency Department (HOSPITAL_COMMUNITY): Payer: Medicaid Other

## 2016-05-14 ENCOUNTER — Emergency Department (HOSPITAL_COMMUNITY)
Admission: EM | Admit: 2016-05-14 | Discharge: 2016-05-14 | Disposition: A | Discharge status: Home / Self Care | Payer: Medicaid Other | Attending: Emergency Medicine | Admitting: Emergency Medicine

## 2016-05-14 DIAGNOSIS — N760 Acute vaginitis: Secondary | ICD-10-CM | POA: Insufficient documentation

## 2016-05-14 DIAGNOSIS — E1142 Type 2 diabetes mellitus with diabetic polyneuropathy: Secondary | ICD-10-CM | POA: Insufficient documentation

## 2016-05-14 DIAGNOSIS — K219 Gastro-esophageal reflux disease without esophagitis: Secondary | ICD-10-CM | POA: Insufficient documentation

## 2016-05-14 DIAGNOSIS — F17200 Nicotine dependence, unspecified, uncomplicated: Secondary | ICD-10-CM | POA: Insufficient documentation

## 2016-05-14 DIAGNOSIS — Z794 Long term (current) use of insulin: Secondary | ICD-10-CM | POA: Insufficient documentation

## 2016-05-14 DIAGNOSIS — I10 Essential (primary) hypertension: Secondary | ICD-10-CM | POA: Insufficient documentation

## 2016-05-14 DIAGNOSIS — Z7984 Long term (current) use of oral hypoglycemic drugs: Secondary | ICD-10-CM | POA: Insufficient documentation

## 2016-05-14 DIAGNOSIS — N72 Inflammatory disease of cervix uteri: Secondary | ICD-10-CM | POA: Insufficient documentation

## 2016-05-14 DIAGNOSIS — B9689 Other specified bacterial agents as the cause of diseases classified elsewhere: Secondary | ICD-10-CM | POA: Insufficient documentation

## 2016-05-14 LAB — URINALYSIS, ROUTINE W REFLEX MICROSCOPIC
BILIRUBIN URINE: NEGATIVE
Glucose, UA: >1000 mg/dL — AB
Hgb urine dipstick: NEGATIVE
KETONES UR: NEGATIVE mg/dL
LEUKOCYTES UA: NEGATIVE
Nitrite: NEGATIVE
PH: 7 (ref 5.0–8.0)
PROTEIN: NEGATIVE mg/dL
Specific Gravity, Urine: 1.04 — ABNORMAL HIGH (ref 1.005–1.030)

## 2016-05-14 LAB — BASIC METABOLIC PANEL
ANION GAP: 11 (ref 5–15)
BUN: <5 mg/dL — ABNORMAL LOW (ref 6–20)
CALCIUM: 10.6 mg/dL — AB (ref 8.9–10.3)
CO2: 24 mmol/L (ref 22–32)
Chloride: 100 mmol/L — ABNORMAL LOW (ref 101–111)
Creatinine, Ser: 0.56 mg/dL (ref 0.44–1.00)
GFR calc non Af Amer: >60 mL/min (ref >60)
Glucose, Bld: 343 mg/dL — ABNORMAL HIGH (ref 65–99)
Potassium: 4.3 mmol/L (ref 3.5–5.1)
SODIUM: 135 mmol/L (ref 135–145)

## 2016-05-14 LAB — CBC
HCT: 39.9 % (ref 36.0–46.0)
HEMOGLOBIN: 13.2 g/dL (ref 12.0–15.0)
MCH: 27.7 pg (ref 26.0–34.0)
MCHC: 33.1 g/dL (ref 30.0–36.0)
MCV: 83.6 fL (ref 78.0–100.0)
Platelets: 300 10*3/uL (ref 150–400)
RBC: 4.77 MIL/uL (ref 3.87–5.11)
RDW: 13.4 % (ref 11.5–15.5)
WBC: 8 10*3/uL (ref 4.0–10.5)

## 2016-05-14 LAB — URINE MICROSCOPIC-ADD ON

## 2016-05-14 LAB — WET PREP, GENITAL
Sperm: NONE SEEN
TRICH WET PREP: NONE SEEN
YEAST WET PREP: NONE SEEN

## 2016-05-14 LAB — I-STAT TROPONIN, ED: TROPONIN I, POC: 0 ng/mL (ref 0.00–0.08)

## 2016-05-14 MED ORDER — METRONIDAZOLE 500 MG PO TABS
500.0000 mg | ORAL_TABLET | Freq: Once | ORAL | Status: AC
  Administered 2016-05-14: 500 mg via ORAL
  Filled 2016-05-14: qty 1

## 2016-05-14 MED ORDER — GI COCKTAIL ~~LOC~~
30.0000 mL | Freq: Once | ORAL | Status: AC
  Administered 2016-05-14: 30 mL via ORAL
  Filled 2016-05-14: qty 30

## 2016-05-14 MED ORDER — FAMOTIDINE 20 MG PO TABS: 20.0000 mg | ORAL_TABLET | Freq: Two times a day (BID) | ORAL | 0 refills | Status: DC

## 2016-05-14 MED ORDER — CEFTRIAXONE SODIUM 250 MG IJ SOLR
250.0000 mg | Freq: Once | INTRAMUSCULAR | Status: AC
  Administered 2016-05-14: 250 mg via INTRAMUSCULAR
  Filled 2016-05-14: qty 250

## 2016-05-14 MED ORDER — STERILE WATER FOR INJECTION IJ SOLN
INTRAMUSCULAR | Status: AC
  Filled 2016-05-14: qty 10

## 2016-05-14 MED ORDER — STERILE WATER FOR INJECTION IJ SOLN
INTRAMUSCULAR | Status: AC
  Administered 2016-05-14: 0.9 mL
  Filled 2016-05-14: qty 10

## 2016-05-14 MED ORDER — METRONIDAZOLE 500 MG PO TABS: 500.0000 mg | ORAL_TABLET | Freq: Two times a day (BID) | ORAL | 0 refills | Status: DC

## 2016-05-14 MED ORDER — AZITHROMYCIN 250 MG PO TABS
1000.0000 mg | ORAL_TABLET | Freq: Once | ORAL | Status: AC
  Administered 2016-05-14: 1000 mg via ORAL
  Filled 2016-05-14: qty 4

## 2016-05-14 MED FILL — !NOVOLIN 70/30 100 UNITS/ML: (70-30) 100 | 14 days supply | Qty: 20 | Fill #1

## 2016-05-14 MED FILL — metroNIDAZOLE 500 MG TABS: 500 | 7 days supply | Qty: 14 | Fill #0

## 2016-05-14 MED FILL — FAMOTIDINE 20 MG TABLET: 20 | 15 days supply | Qty: 30 | Fill #0

## 2016-05-14 MED FILL — ?LISINOPRIL 20 MG TABLET: 20 | 30 days supply | Qty: 30 | Fill #1

## 2016-05-14 NOTE — ED Provider Notes (Addendum)
[facility]-EMERGENCY DEPT Provider Note   CSN: [id number] Arrival date & time: 05/14/16  6578  First Provider Contact:  None       History   Chief Complaint Chief Complaint  Patient presents with  . Chest Pain  . Vaginal Discharge    HPI Vanessa Case Pain is a 45 y.o. female.  She has 2 issues today that she wants checked.  She has had vaginal discharge and suprapubic pain for about 1 month.  She has tried multiple otc meds for possible yeast infection, but it is not improving.  She also c/o cp that is described as a burning sensation.  She said that food gets stuck sometimes.  She has seen GI in the past, but no recent visits.  The history is provided by the patient.  Chest Pain   This is a new problem. The quality of the pain is described as burning. The pain radiates to the epigastrium.  Vaginal Discharge      Past Medical History:  Diagnosis Date  . Anemia   . Anxiety   . Arthritis   . Chronic abdominal pain   . Chronic back pain   . Chronic headache   . Chronic neck pain   . Depression   . Diabetes mellitus    adult onset dm-maintained on glipizide and metformin, pt states fasting glucose runs 110  . Fibromyalgia   . GERD (gastroesophageal reflux disease)   . Hyperlipidemia   . Hypertension    maintained on lisinopril hct, metoprolol-134/86 at pat visit  . Neuromuscular disorder (HCC)   . Neuropathy (HCC)   . Obesity   . Substance abuse     Patient Active Problem List   Diagnosis Date Noted  . Suicidal ideation 07/20/2015  . Major depressive disorder, recurrent, severe without psychotic features (HCC)   . Type 2 diabetes mellitus with diabetic polyneuropathy (HCC) 05/30/2015  . Other fatigue 05/30/2015  . Essential hypertension 05/30/2015  . HLD (hyperlipidemia) 05/30/2015  . Bipolar 2 disorder, major depressive episode (HCC) 09/20/2014  . Cholelithiasis with acute cholecystitis 06/28/2013  . Right arm weakness 03/05/2013    Past Surgical History:    Procedure Laterality Date  . ABDOMINAL HYSTERECTOMY  10/30/2011   Procedure: HYSTERECTOMY ABDOMINAL;  Surgeon: Bing Plume, MD;  Location: [facility];  Service: Gynecology;  Laterality: N/A;  . CESAREAN SECTION  x3  . CHOLECYSTECTOMY N/A 06/28/2013   Procedure: LAPAROSCOPIC CHOLECYSTECTOMY;  Surgeon: Shelly Rubenstein, MD;  Location: [facility];  Service: General;  Laterality: N/A;  . SALPINGOOPHORECTOMY  10/30/2011   Procedure: SALPINGO OOPHERECTOMY;  Surgeon: Bing Plume, MD;  Location: [facility];  Service: Gynecology;  Laterality: Right;  . WRIST SURGERY     carpel tunnel and tendonitis    OB History    Gravida Para Term Preterm AB Living   3 3 3     3    SAB TAB Ectopic Multiple Live Births                   Home Medications    Prior to Admission medications   Medication Sig Start Date End Date Taking? Authorizing Provider  famotidine (PEPCID) 20 MG tablet Take 1 tablet (20 mg total) by mouth 2 (two) times daily. 05/14/16   Jacalyn Lefevre, MD  FLUoxetine (PROZAC) 20 MG capsule Take 20 mg by mouth daily.    Historical Provider, MD  fluticasone (FLOVENT HFA) 44 MCG/ACT inhaler Inhale 2 puffs into the lungs 2 (two) times daily  as needed (for shortness of breath). 03/14/16   Benjiman Core, MD  gabapentin (NEURONTIN) 400 MG capsule Take 1 capsule (400 mg total) by mouth 3 (three) times daily. 03/14/16   Benjiman Core, MD  HUMALOG MIX 75/25 (75-25) 100 UNIT/ML SUSP injection Inject 60 Units into the skin 2 (two) times daily. Patient not taking: Reported on 03/14/2016 07/25/15   Beau Fanny, FNP  hydrOXYzine (ATARAX/VISTARIL) 50 MG tablet Take 1 tablet (50 mg total) by mouth 3 (three) times daily as needed for anxiety. Patient not taking: Reported on 03/14/2016 07/25/15   Beau Fanny, FNP  insulin aspart (NOVOLOG) 100 UNIT/ML injection Inject 0-20 Units into the skin 3 (three) times daily with meals. USE SLIDING SCALE AT HOME Patient not taking: Reported on 03/14/2016 07/25/15   Beau Fanny, FNP  lidocaine (LIDODERM) 5 % Place 1 patch onto the skin daily. Remove & Discard patch within 12 hours or as directed by MD Apply the patch to the Right side of the neck. Patient not taking: Reported on 03/14/2016 07/25/15   Beau Fanny, FNP  lisinopril (PRINIVIL,ZESTRIL) 20 MG tablet Take 1 tablet (20 mg total) by mouth daily. 03/14/16   Benjiman Core, MD  lovastatin (MEVACOR) 40 MG tablet Take 1 tablet (40 mg total) by mouth at bedtime. Patient not taking: Reported on 03/14/2016 07/25/15   Beau Fanny, FNP  metFORMIN (GLUCOPHAGE) 500 MG tablet Take 2 tablets (1,000 mg total) by mouth 2 (two) times daily with a meal. 03/14/16   Benjiman Core, MD  metroNIDAZOLE (FLAGYL) 500 MG tablet Take 1 tablet (500 mg total) by mouth 2 (two) times daily. 05/14/16   Jacalyn Lefevre, MD  nicotine (NICODERM CQ - DOSED IN MG/24 HOURS) 21 mg/24hr patch Place 1 patch (21 mg total) onto the skin daily at 6 (six) AM. Patient not taking: Reported on 03/14/2016 07/25/15   Beau Fanny, FNP  omeprazole (PRILOSEC) 20 MG capsule Take 1 capsule (20 mg total) by mouth daily. 03/14/16   Benjiman Core, MD  PARoxetine (PAXIL) 20 MG tablet Take 1 tablet (20 mg total) by mouth daily. Patient not taking: Reported on 03/14/2016 07/25/15   Beau Fanny, FNP  prazosin (MINIPRESS) 1 MG capsule Take 1 capsule (1 mg total) by mouth at bedtime. To prevent nightmares 07/25/15   Beau Fanny, FNP  traZODone (DESYREL) 50 MG tablet Take 1 tablet (50 mg total) by mouth at bedtime as needed for sleep. Patient taking differently: Take 100 mg by mouth at bedtime.  07/25/15   Beau Fanny, FNP    Family History Family History  Problem Relation Age of Onset  . Diabetes Mother   . Cystic fibrosis Father   . Diabetes Maternal Grandmother   . Heart disease Maternal Grandfather     Social History Social History  Substance Use Topics  . Smoking status: Current Every Day Smoker    Packs/day: 0.50    Last attempt to quit:  10/24/1997  . Smokeless tobacco: Never Used     Comment: Smoking 1 pp week  . Alcohol use No     Allergies   Percocet [oxycodone-acetaminophen] and Ambien [zolpidem]   Review of Systems Review of Systems  Cardiovascular: Positive for chest pain.  Genitourinary: Positive for vaginal discharge.  All other systems reviewed and are negative.    Physical Exam Updated Vital Signs BP 151/82 (BP Location: Right Arm)   Pulse 86   Temp 97.8 F (36.6 C) (Oral)   Resp  16   LMP 10/07/2011   SpO2 100%   Physical Exam  Constitutional: She is oriented to person, place, and time. She appears well-developed and well-nourished.  HENT:  Head: Normocephalic and atraumatic.  Right Ear: External ear normal.  Left Ear: External ear normal.  Nose: Nose normal.  Mouth/Throat: Oropharynx is clear and moist.  Eyes: Conjunctivae and EOM are normal. Pupils are equal, round, and reactive to light.  Neck: Normal range of motion. Neck supple.  Cardiovascular: Normal rate, regular rhythm, normal heart sounds and intact distal pulses.   Pulmonary/Chest: Effort normal and breath sounds normal.  Abdominal: Soft. Bowel sounds are normal. There is tenderness in the epigastric area.  Genitourinary: Cervix exhibits discharge. Right adnexum displays no tenderness. Left adnexum displays no tenderness. No tenderness in the vagina. Vaginal discharge found.  Genitourinary Comments: S/p hyst  Musculoskeletal: Normal range of motion.  Neurological: She is alert and oriented to person, place, and time.  Skin: Skin is warm and dry.  Psychiatric: She has a normal mood and affect. Her behavior is normal. Judgment and thought content normal.  Nursing note and vitals reviewed.    ED Treatments / Results  Labs (all labs ordered are listed, but only abnormal results are displayed) Labs Reviewed  WET PREP, GENITAL - Abnormal; Notable for the following:       Result Value   Clue Cells Wet Prep HPF POC PRESENT (*)     WBC, Wet Prep HPF POC MANY (*)    All other components within normal limits  BASIC METABOLIC PANEL - Abnormal; Notable for the following:    Chloride 100 (*)    Glucose, Bld 343 (*)    BUN <5 (*)    Calcium 10.6 (*)    All other components within normal limits  URINALYSIS, ROUTINE W REFLEX MICROSCOPIC (NOT AT [facility]) - Abnormal; Notable for the following:    Specific Gravity, Urine 1.040 (*)    Glucose, UA >1000 (*)    All other components within normal limits  URINE MICROSCOPIC-ADD ON - Abnormal; Notable for the following:    Squamous Epithelial / LPF 0-5 (*)    Bacteria, UA RARE (*)    All other components within normal limits  CBC  I-STAT TROPOININ, ED  GC/CHLAMYDIA PROBE AMP (Alex) NOT AT [facility]    EKG  EKG Interpretation  Date/Time:  Tuesday May 14 2016 10:05:40 EDT Ventricular Rate:  93 PR Interval:  128 QRS Duration: 74 QT Interval:  362 QTC Calculation: 450 R Axis:   94 Text Interpretation:  Normal sinus rhythm Rightward axis Borderline ECG Confirmed by Faiga Stones MD, Theordore Cisnero ([id number]) on 05/14/2016 11:41:16 AM       Radiology Dg Chest 2 View  Result Date: 05/14/2016 CLINICAL DATA:  Chest tightness for 2 weeks. EXAM: CHEST  2 VIEW COMPARISON:  07/12/2013 FINDINGS: The heart size and mediastinal contours are within normal limits. Both lungs are clear. The visualized skeletal structures are unremarkable. IMPRESSION: No active cardiopulmonary disease. Electronically Signed   By: Charlett Nose M.D.   On: 05/14/2016 11:07   Procedures Procedures (including critical care time)  Medications Ordered in ED Medications  sterile water (preservative free) injection (not administered)  gi cocktail (Maalox,Lidocaine,Donnatal) (30 mLs Oral Given 05/14/16 1149)  metroNIDAZOLE (FLAGYL) tablet 500 mg (500 mg Oral Given 05/14/16 1342)  azithromycin (ZITHROMAX) tablet 1,000 mg (1,000 mg Oral Given 05/14/16 1342)  cefTRIAXone (ROCEPHIN) injection 250 mg (250 mg Intramuscular Given  05/14/16 1342)  sterile water (preservative  free) injection (0.9 mLs  Given 05/14/16 1346)     Initial Impression / Assessment and Plan / ED Course  I have reviewed the triage vital signs and the nursing notes.  Pertinent labs & imaging results that were available during my care of the patient were reviewed by me and considered in my medical decision making (see chart for details).  Clinical Course  Value Comment By Time  WBC, Wet Prep HPF POC: (!) MANY (Reviewed) Jacalyn Lefevre, MD 07/25 1318   Pt will be treated for possible GC/Chl with rocephin and zithromax.  Pt knows we will call her if it's positive.  The pt will also be given rx and 1st dose of flagyl.  Final Clinical Impressions(s) / ED Diagnoses   Final diagnoses:  Bacterial vaginosis  Cervicitis  Gastroesophageal reflux disease, esophagitis presence not specified    New Prescriptions Discharge Medication List as of 05/14/2016  1:26 PM    START taking these medications   Details  famotidine (PEPCID) 20 MG tablet Take 1 tablet (20 mg total) by mouth 2 (two) times daily., Starting Tue 05/14/2016, Print    metroNIDAZOLE (FLAGYL) 500 MG tablet Take 1 tablet (500 mg total) by mouth 2 (two) times daily., Starting Tue 05/14/2016, Print         Jacalyn Lefevre, MD 05/14/16 1422    Jacalyn Lefevre, MD 05/14/16 [phone]

## 2016-05-14 NOTE — ED Triage Notes (Signed)
Pt reports to the ED for eval CP and heaviness since Saturday. She has hx of GERD and she states that her breasts may be weighing her down. Pt reports she had unprotected sex and she is now having white, chalky, malodorous vaginal d/c and vaginal irritation. Pt A&Ox4, resp e/u, and skin warm and dry.

## 2016-05-14 NOTE — ED Notes (Addendum)
Pelvic exam done by Dr. Particia Nearing and Smith Robert - EMT assisted.

## 2016-05-15 LAB — GC/CHLAMYDIA PROBE AMP (~~LOC~~) NOT AT ARMC
CHLAMYDIA, DNA PROBE: NEGATIVE
NEISSERIA GONORRHEA: NEGATIVE

## 2016-07-18 ENCOUNTER — Emergency Department (HOSPITAL_COMMUNITY): Payer: Self-pay

## 2016-07-18 ENCOUNTER — Emergency Department (HOSPITAL_COMMUNITY)
Admission: EM | Admit: 2016-07-18 | Discharge: 2016-07-18 | Disposition: A | Discharge status: Home / Self Care | Payer: Self-pay | Attending: Emergency Medicine | Admitting: Emergency Medicine

## 2016-07-18 ENCOUNTER — Encounter (HOSPITAL_COMMUNITY): Payer: Self-pay | Admitting: Emergency Medicine

## 2016-07-18 DIAGNOSIS — Y92009 Unspecified place in unspecified non-institutional (private) residence as the place of occurrence of the external cause: Secondary | ICD-10-CM | POA: Insufficient documentation

## 2016-07-18 DIAGNOSIS — M62830 Muscle spasm of back: Secondary | ICD-10-CM | POA: Insufficient documentation

## 2016-07-18 DIAGNOSIS — Z7984 Long term (current) use of oral hypoglycemic drugs: Secondary | ICD-10-CM | POA: Insufficient documentation

## 2016-07-18 DIAGNOSIS — W010XXA Fall on same level from slipping, tripping and stumbling without subsequent striking against object, initial encounter: Secondary | ICD-10-CM | POA: Insufficient documentation

## 2016-07-18 DIAGNOSIS — F17200 Nicotine dependence, unspecified, uncomplicated: Secondary | ICD-10-CM | POA: Insufficient documentation

## 2016-07-18 DIAGNOSIS — Y939 Activity, unspecified: Secondary | ICD-10-CM | POA: Insufficient documentation

## 2016-07-18 DIAGNOSIS — R51 Headache: Secondary | ICD-10-CM | POA: Insufficient documentation

## 2016-07-18 DIAGNOSIS — W19XXXA Unspecified fall, initial encounter: Secondary | ICD-10-CM

## 2016-07-18 DIAGNOSIS — Z794 Long term (current) use of insulin: Secondary | ICD-10-CM | POA: Insufficient documentation

## 2016-07-18 DIAGNOSIS — E1140 Type 2 diabetes mellitus with diabetic neuropathy, unspecified: Secondary | ICD-10-CM | POA: Insufficient documentation

## 2016-07-18 DIAGNOSIS — Y999 Unspecified external cause status: Secondary | ICD-10-CM | POA: Insufficient documentation

## 2016-07-18 DIAGNOSIS — R519 Headache, unspecified: Secondary | ICD-10-CM

## 2016-07-18 DIAGNOSIS — I10 Essential (primary) hypertension: Secondary | ICD-10-CM | POA: Insufficient documentation

## 2016-07-18 MED ORDER — SODIUM CHLORIDE 0.9 % IV BOLUS (SEPSIS)
1000.0000 mL | Freq: Once | INTRAVENOUS | Status: AC
  Administered 2016-07-18: 1000 mL via INTRAVENOUS

## 2016-07-18 MED ORDER — LISINOPRIL 20 MG PO TABS: 20.0000 mg | ORAL_TABLET | Freq: Every day | ORAL | 0 refills | Status: DC

## 2016-07-18 MED ORDER — IBUPROFEN 600 MG PO TABS: 600.0000 mg | ORAL_TABLET | Freq: Four times a day (QID) | ORAL | 0 refills | Status: DC | PRN

## 2016-07-18 MED ORDER — DIPHENHYDRAMINE HCL 50 MG/ML IJ SOLN
25.0000 mg | Freq: Once | INTRAMUSCULAR | Status: AC
  Administered 2016-07-18: 25 mg via INTRAVENOUS
  Filled 2016-07-18: qty 1

## 2016-07-18 MED ORDER — METOCLOPRAMIDE HCL 5 MG/ML IJ SOLN
10.0000 mg | Freq: Once | INTRAMUSCULAR | Status: AC
  Administered 2016-07-18: 10 mg via INTRAVENOUS
  Filled 2016-07-18: qty 2

## 2016-07-18 MED ORDER — CYCLOBENZAPRINE HCL 10 MG PO TABS: 10.0000 mg | ORAL_TABLET | Freq: Three times a day (TID) | ORAL | 0 refills | Status: DC | PRN

## 2016-07-18 NOTE — ED Triage Notes (Signed)
Larey SeatFell last Friday due to vertigo, c/o L shoulder pain down to wrist with numbness (numbness started last night), L leg pain. Denies hitting head when she fell. History of migraines but has head pain.

## 2016-07-18 NOTE — Discharge Instructions (Signed)
Read the information below.  Use the prescribed medication as directed.  Please discuss all new medications with your pharmacist.  You may return to the Emergency Department at any time for worsening condition or any new symptoms that concern you.   ° °You are having a headache. No specific cause was found today for your headache. It may have been a migraine or other cause of headache. Stress, anxiety, fatigue, and depression are common triggers for headaches. Your headache today does not appear to be life-threatening or require hospitalization, but often the exact cause of headaches is not determined in the emergency department. Therefore, follow-up with your doctor is very important to find out what may have caused your headache, and whether or not you need any further diagnostic testing or treatment. Sometimes headaches can appear benign (not harmful), but then more serious symptoms can develop which should prompt an immediate re-evaluation by your doctor or the emergency department. °SEEK MEDICAL ATTENTION IF: °You develop possible problems with medications prescribed.  °The medications don't resolve your headache, if it recurs , or if you have multiple episodes of vomiting or can't take fluids. °You have a change from the usual headache. °RETURN IMMEDIATELY IF you develop a sudden, severe headache or confusion, become poorly responsive or faint, develop a fever above 100.4F or problem breathing, have a change in speech, vision, swallowing, or understanding, or develop new weakness, numbness, tingling, incoordination, or have a seizure. °

## 2016-07-18 NOTE — ED Provider Notes (Signed)
[facility]-EMERGENCY DEPT Provider Note   CSN: [id number] Arrival date & time: 07/18/16  4098     History   Chief Complaint No chief complaint on file.   HPI Vanessa Case is a 45 y.o. female.  HPI   Pt with hx chronic pain, fibromyalgia, migraines p/w right occipital headache x 8 days with sensitivity to light and sound.  She tripped on a rug and fell 6 days ago and since has had pain, numbness, and weakness in her left arm and left leg.  Associated feeling cold and sweating at the same time.  Denies fevers, URI symptoms, CP, SOB.  Pt ambulates with a cane at baseline, has been ambulatory since fall.    Past Medical History:  Diagnosis Date  . Anemia   . Anxiety   . Arthritis   . Chronic abdominal pain   . Chronic back pain   . Chronic headache   . Chronic neck pain   . Depression   . Diabetes mellitus    adult onset dm-maintained on glipizide and metformin, pt states fasting glucose runs 110  . Fibromyalgia   . GERD (gastroesophageal reflux disease)   . Hyperlipidemia   . Hypertension    maintained on lisinopril hct, metoprolol-134/86 at pat visit  . Neuromuscular disorder (HCC)   . Neuropathy (HCC)   . Obesity   . Substance abuse     Patient Active Problem List   Diagnosis Date Noted  . Suicidal ideation 07/20/2015  . Major depressive disorder, recurrent, severe without psychotic features (HCC)   . Type 2 diabetes mellitus with diabetic polyneuropathy (HCC) 05/30/2015  . Other fatigue 05/30/2015  . Essential hypertension 05/30/2015  . HLD (hyperlipidemia) 05/30/2015  . Bipolar 2 disorder, major depressive episode (HCC) 09/20/2014  . Cholelithiasis with acute cholecystitis 06/28/2013  . Right arm weakness 03/05/2013    Past Surgical History:  Procedure Laterality Date  . ABDOMINAL HYSTERECTOMY  10/30/2011   Procedure: HYSTERECTOMY ABDOMINAL;  Surgeon: Bing Plume, MD;  Location: [facility];  Service: Gynecology;  Laterality: N/A;  . CESAREAN SECTION  x3    . CHOLECYSTECTOMY N/A 06/28/2013   Procedure: LAPAROSCOPIC CHOLECYSTECTOMY;  Surgeon: Shelly Rubenstein, MD;  Location: [facility];  Service: General;  Laterality: N/A;  . SALPINGOOPHORECTOMY  10/30/2011   Procedure: SALPINGO OOPHERECTOMY;  Surgeon: Bing Plume, MD;  Location: [facility];  Service: Gynecology;  Laterality: Right;  . WRIST SURGERY     carpel tunnel and tendonitis    OB History    Gravida Para Term Preterm AB Living   3 3 3     3    SAB TAB Ectopic Multiple Live Births                   Home Medications    Prior to Admission medications   Medication Sig Start Date End Date Taking? Authorizing Provider  cyclobenzaprine (FLEXERIL) 10 MG tablet Take 1 tablet (10 mg total) by mouth 3 (three) times daily as needed for muscle spasms (and pain). 07/18/16   Trixie Dredge, PA-C  famotidine (PEPCID) 20 MG tablet Take 1 tablet (20 mg total) by mouth 2 (two) times daily. 05/14/16   Jacalyn Lefevre, MD  FLUoxetine (PROZAC) 20 MG capsule Take 20 mg by mouth daily.    Historical Provider, MD  fluticasone (FLOVENT HFA) 44 MCG/ACT inhaler Inhale 2 puffs into the lungs 2 (two) times daily as needed (for shortness of breath). 03/14/16   Benjiman Core, MD  gabapentin (NEURONTIN) 400  MG capsule Take 1 capsule (400 mg total) by mouth 3 (three) times daily. 03/14/16   Benjiman Core, MD  HUMALOG MIX 75/25 (75-25) 100 UNIT/ML SUSP injection Inject 60 Units into the skin 2 (two) times daily. Patient not taking: Reported on 03/14/2016 07/25/15   Beau Fanny, FNP  hydrOXYzine (ATARAX/VISTARIL) 50 MG tablet Take 1 tablet (50 mg total) by mouth 3 (three) times daily as needed for anxiety. Patient not taking: Reported on 03/14/2016 07/25/15   Beau Fanny, FNP  ibuprofen (ADVIL,MOTRIN) 600 MG tablet Take 1 tablet (600 mg total) by mouth every 6 (six) hours as needed for mild pain or moderate pain. 07/18/16   Trixie Dredge, PA-C  insulin aspart (NOVOLOG) 100 UNIT/ML injection Inject 0-20 Units into the skin 3  (three) times daily with meals. USE SLIDING SCALE AT HOME Patient not taking: Reported on 03/14/2016 07/25/15   Beau Fanny, FNP  lidocaine (LIDODERM) 5 % Place 1 patch onto the skin daily. Remove & Discard patch within 12 hours or as directed by MD Apply the patch to the Right side of the neck. Patient not taking: Reported on 03/14/2016 07/25/15   Beau Fanny, FNP  lisinopril (PRINIVIL,ZESTRIL) 20 MG tablet Take 1 tablet (20 mg total) by mouth daily. 07/18/16   Trixie Dredge, PA-C  lovastatin (MEVACOR) 40 MG tablet Take 1 tablet (40 mg total) by mouth at bedtime. Patient not taking: Reported on 03/14/2016 07/25/15   Beau Fanny, FNP  metFORMIN (GLUCOPHAGE) 500 MG tablet Take 2 tablets (1,000 mg total) by mouth 2 (two) times daily with a meal. 03/14/16   Benjiman Core, MD  metroNIDAZOLE (FLAGYL) 500 MG tablet Take 1 tablet (500 mg total) by mouth 2 (two) times daily. 05/14/16   Jacalyn Lefevre, MD  nicotine (NICODERM CQ - DOSED IN MG/24 HOURS) 21 mg/24hr patch Place 1 patch (21 mg total) onto the skin daily at 6 (six) AM. Patient not taking: Reported on 03/14/2016 07/25/15   Beau Fanny, FNP  omeprazole (PRILOSEC) 20 MG capsule Take 1 capsule (20 mg total) by mouth daily. 03/14/16   Benjiman Core, MD  PARoxetine (PAXIL) 20 MG tablet Take 1 tablet (20 mg total) by mouth daily. Patient not taking: Reported on 03/14/2016 07/25/15   Beau Fanny, FNP  prazosin (MINIPRESS) 1 MG capsule Take 1 capsule (1 mg total) by mouth at bedtime. To prevent nightmares 07/25/15   Beau Fanny, FNP  traZODone (DESYREL) 50 MG tablet Take 1 tablet (50 mg total) by mouth at bedtime as needed for sleep. Patient taking differently: Take 100 mg by mouth at bedtime.  07/25/15   Beau Fanny, FNP    Family History Family History  Problem Relation Age of Onset  . Diabetes Mother   . Cystic fibrosis Father   . Diabetes Maternal Grandmother   . Heart disease Maternal Grandfather     Social History Social History    Substance Use Topics  . Smoking status: Current Every Day Smoker    Packs/day: 0.50    Last attempt to quit: 10/24/1997  . Smokeless tobacco: Never Used     Comment: Smoking 1 pp week  . Alcohol use No     Allergies   Percocet [oxycodone-acetaminophen] and Ambien [zolpidem]   Review of Systems Review of Systems  All other systems reviewed and are negative.    Physical Exam Updated Vital Signs BP 113/84   Pulse 89   Temp 98.4 F (36.9 C) (Oral)  Resp 19   Ht 5\' 5"  (1.651 m)   Wt 81.6 kg   LMP 10/07/2011   SpO2 100%   BMI 29.95 kg/m   Physical Exam  Constitutional: She appears well-developed and well-nourished. No distress.  HENT:  Head: Normocephalic and atraumatic.  Neck: Neck supple.  Pulmonary/Chest: Effort normal. She exhibits no tenderness.  Abdominal: Soft. She exhibits no distension. There is no tenderness.  Musculoskeletal: She exhibits no edema or deformity.       Back:  Neurological: She is alert. No cranial nerve deficit.  Left arm and leg are weaker generally, pt yelling out in pain with attempting to move them.  Sensation and pulses intact.  No edema.    Skin: She is not diaphoretic.  Nursing note and vitals reviewed.    ED Treatments / Results  Labs (all labs ordered are listed, but only abnormal results are displayed) Labs Reviewed - No data to display  EKG  EKG Interpretation  Date/Time:  Thursday July 18 2016 07:50:41 EDT Ventricular Rate:  91 PR Interval:    QRS Duration: 78 QT Interval:  368 QTC Calculation: 453 R Axis:   75 Text Interpretation:  Sinus rhythm Baseline wander in lead(s) I III aVL aVF V3 V4 V5 V6 Confirmed by [facility] MD, PEDRO ([id number]) on 07/18/2016 9:19:15 AM       Radiology Dg Lumbar Spine Complete  Result Date: 07/18/2016 CLINICAL DATA:  Fall 1 week ago due to vertigo. Left shoulder pain down to the left wrist with numbness. Left leg pain. EXAM: LUMBAR SPINE - COMPLETE 4+ VIEW COMPARISON:  CT 03/20/2015  FINDINGS: Vertebral body alignment, heights and disc space heights are normal. There is no compression fracture or subluxation. Subtle early spondylosis is present. Remainder the exam is unremarkable. IMPRESSION: No acute findings. Electronically Signed   By: Elberta Fortis M.D.   On: 07/18/2016 09:20   Ct Head Wo Contrast  Result Date: 07/18/2016 CLINICAL DATA:  45 year old female with history of trauma from a fall at home earlier this morning complaining of posterior headache and left-sided posterior neck pain. EXAM: CT HEAD WITHOUT CONTRAST CT CERVICAL SPINE WITHOUT CONTRAST TECHNIQUE: Multidetector CT imaging of the head and cervical spine was performed following the standard protocol without intravenous contrast. Multiplanar CT image reconstructions of the cervical spine were also generated. COMPARISON:  Head and cervical Spine CT 02/15/2015. FINDINGS: CT HEAD FINDINGS Brain: No acute displaced skull fractures are identified. No acute intracranial abnormality. Specifically, no evidence of acute post-traumatic intracranial hemorrhage, no definite regions of acute/subacute cerebral ischemia, no focal mass, mass effect, hydrocephalus or abnormal intra or extra-axial fluid collections. Vascular: High attenuation in the basilar artery appears to be artifactual. Skull: Normal. Negative for fracture or focal lesion. Sinuses/Orbits: No acute finding. Other: None. CT CERVICAL SPINE FINDINGS Alignment: Normal. Skull base and vertebrae: No acute fracture. No primary bone lesion or focal pathologic process. Soft tissues and spinal canal: No prevertebral fluid or swelling. No visible canal hematoma. Disc levels: No significant degenerative disc disease or facet arthropathy. Upper chest: Negative. Other: None. IMPRESSION: 1. No evidence of significant acute traumatic injury to the skull, brain or cervical spine. 2. There is some high attenuation in the basilar artery, which is strongly favored to be artifactual. If the  patient is obtunded, the possibility of basilar artery thrombosis should be considered, otherwise, this is presumed to be artifact. 3. The appearance of the brain is otherwise normal. Electronically Signed   By: Brayton Mars.D.  On: 07/18/2016 10:02   Ct Cervical Spine Wo Contrast  Result Date: 07/18/2016 CLINICAL DATA:  45 year old female with history of trauma from a fall at home earlier this morning complaining of posterior headache and left-sided posterior neck pain. EXAM: CT HEAD WITHOUT CONTRAST CT CERVICAL SPINE WITHOUT CONTRAST TECHNIQUE: Multidetector CT imaging of the head and cervical spine was performed following the standard protocol without intravenous contrast. Multiplanar CT image reconstructions of the cervical spine were also generated. COMPARISON:  Head and cervical Spine CT 02/15/2015. FINDINGS: CT HEAD FINDINGS Brain: No acute displaced skull fractures are identified. No acute intracranial abnormality. Specifically, no evidence of acute post-traumatic intracranial hemorrhage, no definite regions of acute/subacute cerebral ischemia, no focal mass, mass effect, hydrocephalus or abnormal intra or extra-axial fluid collections. Vascular: High attenuation in the basilar artery appears to be artifactual. Skull: Normal. Negative for fracture or focal lesion. Sinuses/Orbits: No acute finding. Other: None. CT CERVICAL SPINE FINDINGS Alignment: Normal. Skull base and vertebrae: No acute fracture. No primary bone lesion or focal pathologic process. Soft tissues and spinal canal: No prevertebral fluid or swelling. No visible canal hematoma. Disc levels: No significant degenerative disc disease or facet arthropathy. Upper chest: Negative. Other: None. IMPRESSION: 1. No evidence of significant acute traumatic injury to the skull, brain or cervical spine. 2. There is some high attenuation in the basilar artery, which is strongly favored to be artifactual. If the patient is obtunded, the possibility  of basilar artery thrombosis should be considered, otherwise, this is presumed to be artifact. 3. The appearance of the brain is otherwise normal. Electronically Signed   By: Trudie Reed M.D.   On: 07/18/2016 10:02   Dg Shoulder Left  Result Date: 07/18/2016 CLINICAL DATA:  Fall 1 week ago due to vertical. Left shoulder pain. EXAM: LEFT SHOULDER - 2+ VIEW COMPARISON:  None. FINDINGS: There is no evidence of fracture or dislocation. There is no evidence of arthropathy or other focal bone abnormality. Soft tissues are unremarkable. IMPRESSION: Negative. Electronically Signed   By: Elberta Fortis M.D.   On: 07/18/2016 09:22   Dg Hip Unilat W Or Wo Pelvis 2-3 Views Left  Result Date: 07/18/2016 CLINICAL DATA:  Fall 1 week ago due to vertigo. Left shoulder pain down to left wrist as well as left leg pain. EXAM: DG HIP (WITH OR WITHOUT PELVIS) 2-3V LEFT COMPARISON:  None. FINDINGS: There is no evidence of hip fracture or dislocation. There is no evidence of arthropathy or other focal bone abnormality. IMPRESSION: Negative. Electronically Signed   By: Elberta Fortis M.D.   On: 07/18/2016 09:21    Procedures Procedures (including critical care time)  Medications Ordered in ED Medications  diphenhydrAMINE (BENADRYL) injection 25 mg (25 mg Intravenous Given 07/18/16 0946)  metoCLOPramide (REGLAN) injection 10 mg (10 mg Intravenous Given 07/18/16 0945)  sodium chloride 0.9 % bolus 1,000 mL (0 mLs Intravenous Stopped 07/18/16 1117)     Initial Impression / Assessment and Plan / ED Course  I have reviewed the triage vital signs and the nursing notes.  Pertinent labs & imaging results that were available during my care of the patient were reviewed by me and considered in my medical decision making (see chart for details).  Clinical Course  Comment By Time  Pt reports pain is completely relieved.  Pt now has 5/5 strength that is symmetric in upper and lower extremities.  Trixie Dredge, PA-C 09/28 1114     Afebrile nontoxic patient with migraine headache x 8 days, pain  and reported weakness in left arm and left leg.  CT head, c-spine negative for acute changes.  Xrays negative.  After migraine relieved, all symptoms relieved.  Doubt cauda equina, significant cord impingement, infection, intracranial bleed, meningitis.  D/C home with medications for pain/muscle spasm, refill of blood pressure medication at patient's request (has been out of lisinopril x 3 days).  Primary care follow up.  Discussed result, findings, treatment, and follow up  with patient.  Pt given return precautions.  Pt verbalizes understanding and agrees with plan.      Final Clinical Impressions(s) / ED Diagnoses   Final diagnoses:  Bad headache  Fall, initial encounter  Muscle spasm of back    New Prescriptions Discharge Medication List as of 07/18/2016 11:18 AM    START taking these medications   Details  cyclobenzaprine (FLEXERIL) 10 MG tablet Take 1 tablet (10 mg total) by mouth 3 (three) times daily as needed for muscle spasms (and pain)., Starting Thu 07/18/2016, Print    ibuprofen (ADVIL,MOTRIN) 600 MG tablet Take 1 tablet (600 mg total) by mouth every 6 (six) hours as needed for mild pain or moderate pain., Starting Thu 07/18/2016, Print         [place]Emily Gursimran Litaker, PA-C 07/18/16 1157    [place]roseEmily Jodean Valade, PA-C 07/18/16 [address]1158    Pedro Eduardo Sproulardama, [place] 07/19/16 [phone]

## 2016-07-18 NOTE — ED Notes (Signed)
MD at bedside. 

## 2016-08-07 IMAGING — CT CT RENAL STONE PROTOCOL
2 of 4 series · 17 of 46 positions shown, 19 images · non-contrast
Comparison: 09/20/2014, 06/27/2013 and 11/13/2011

CLINICAL DATA: Left flank pain with dysuria and hematuria two days.

EXAM:
CT ABDOMEN AND PELVIS WITHOUT CONTRAST
TECHNIQUE: Multidetector CT imaging of the abdomen and pelvis was performed
following the standard protocol without IV contrast.

[Series 2: stone study 5.0 i30f 1 · axial · 0.81mm/px · z∈[+859,+1244]mm · 14 of 85 slices shown, 16 images]
[im 4/85  soft-tissue]
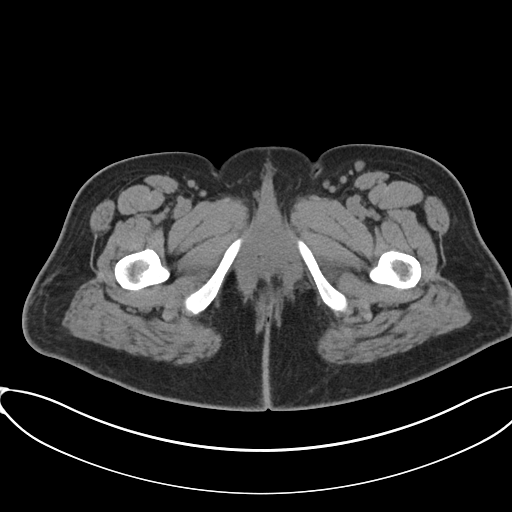
[im 4/85  bone]
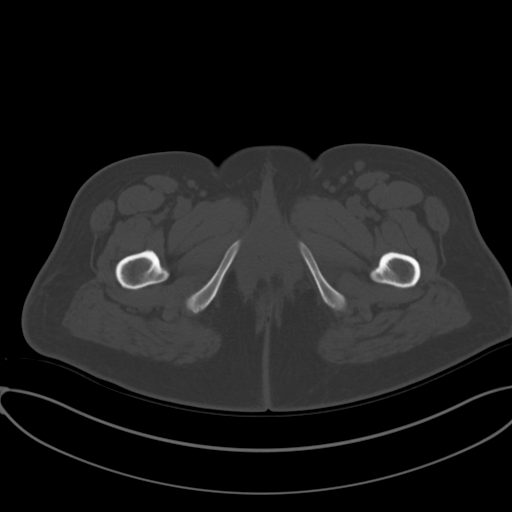
[im 11/85  soft-tissue]
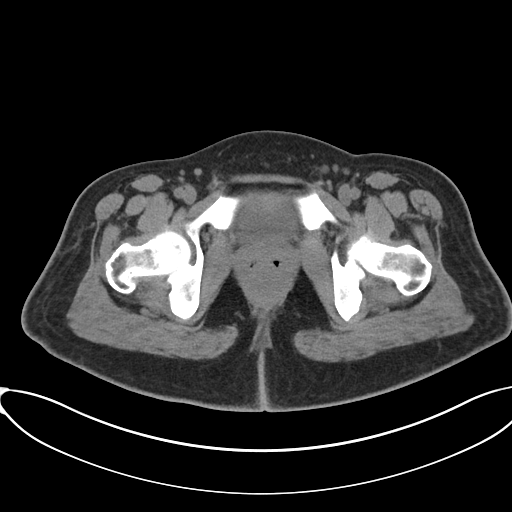
[im 18/85  soft-tissue]
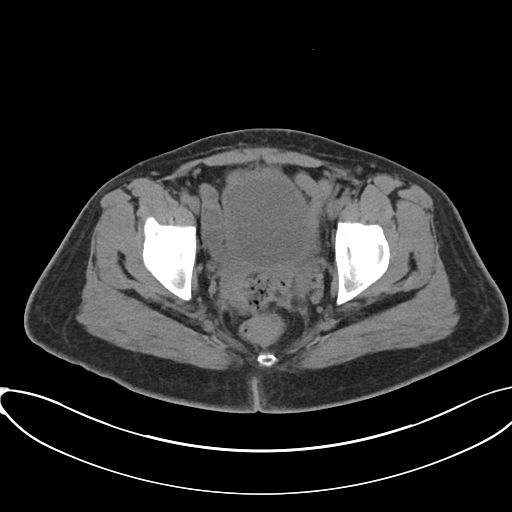
[im 22/85  soft-tissue]
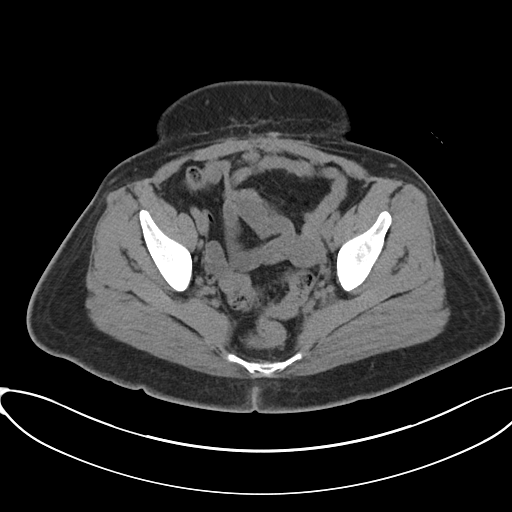
[im 29/85  soft-tissue]
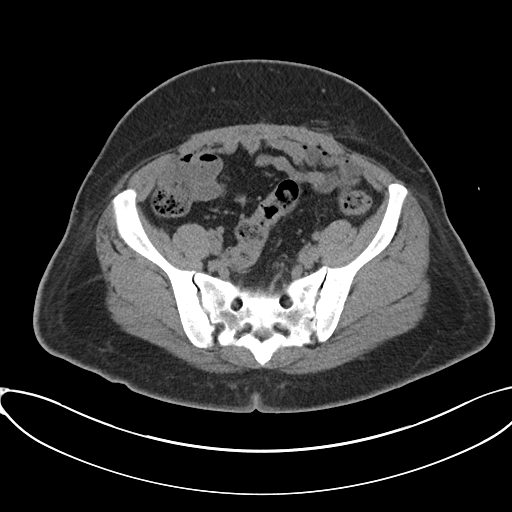
[im 36/85  soft-tissue]
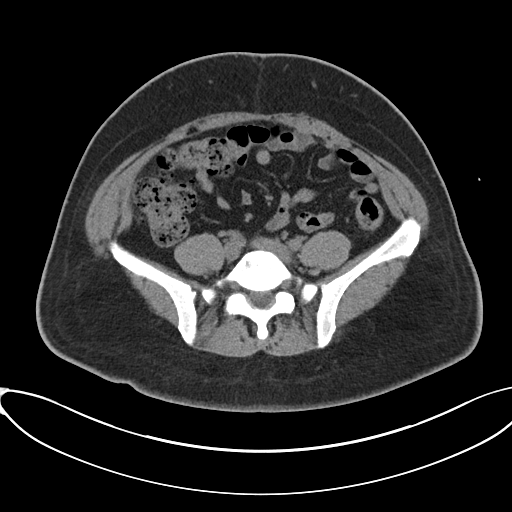
[im 39/85  soft-tissue]
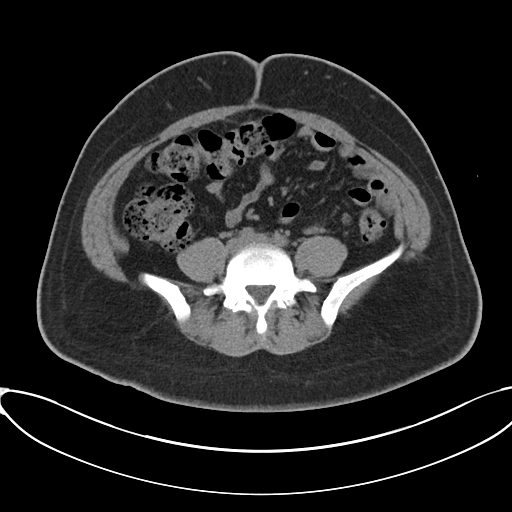
[im 46/85  soft-tissue]
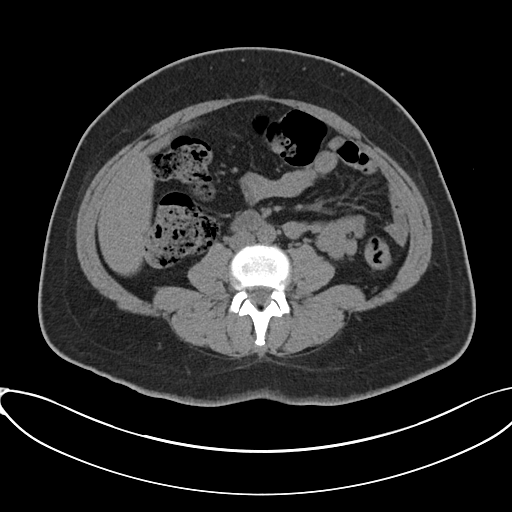
[im 50/85  soft-tissue]
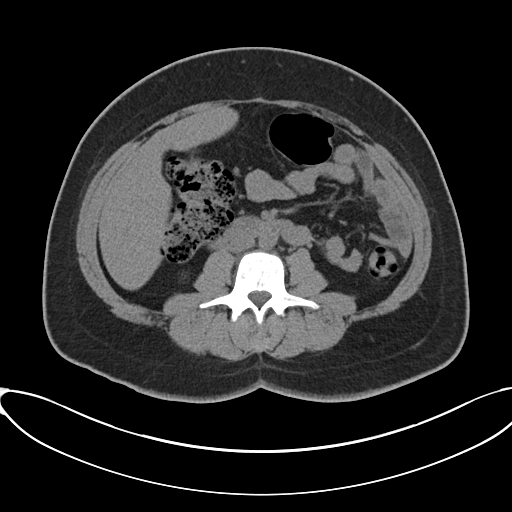
[im 50/85  bone]
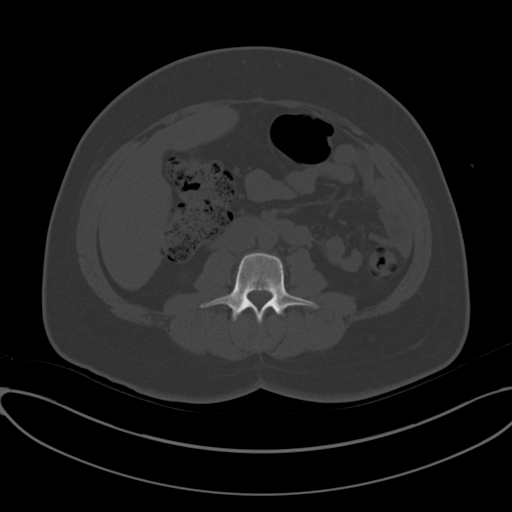
[im 57/85  soft-tissue]
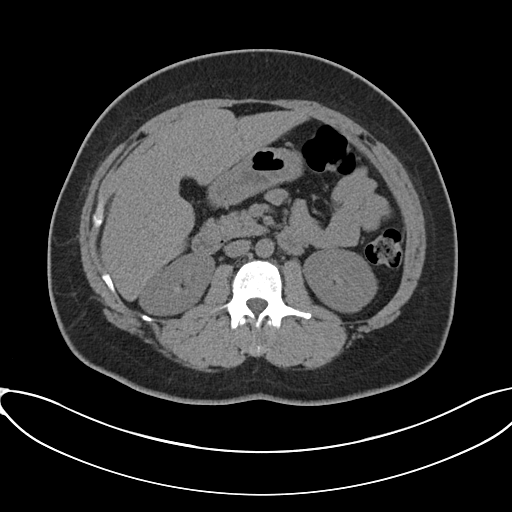
[im 64/85  soft-tissue]
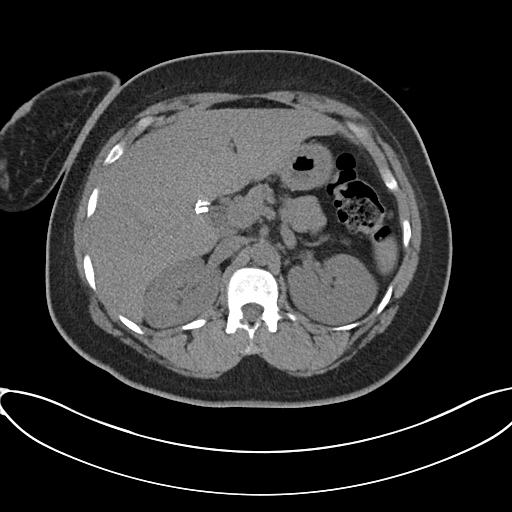
[im 67/85  soft-tissue]
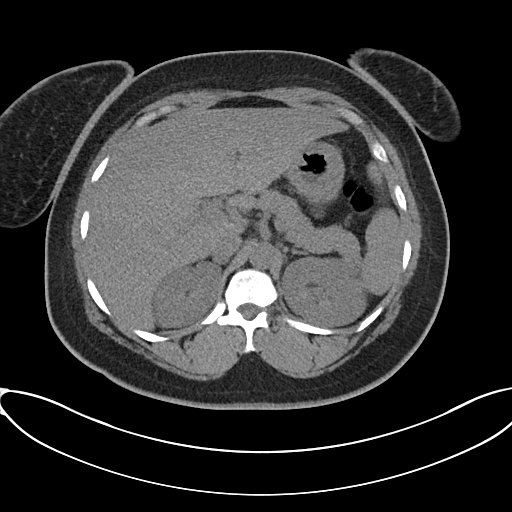
[im 74/85  soft-tissue]
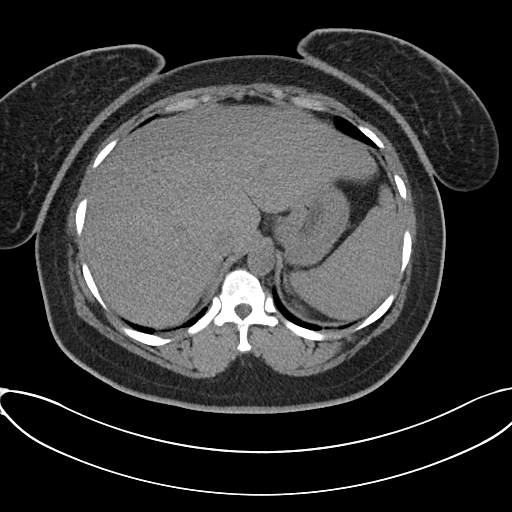
[im 81/85  soft-tissue]
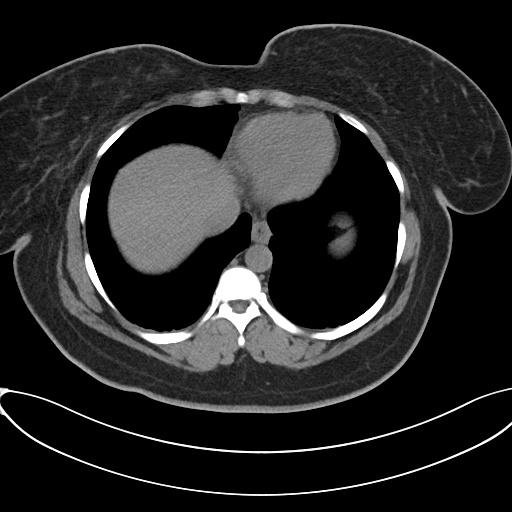

[Series 5: coronal soft tissue · coronal · 0.76mm/px · 3 of 102 slices shown]
[im 34/102  soft-tissue]
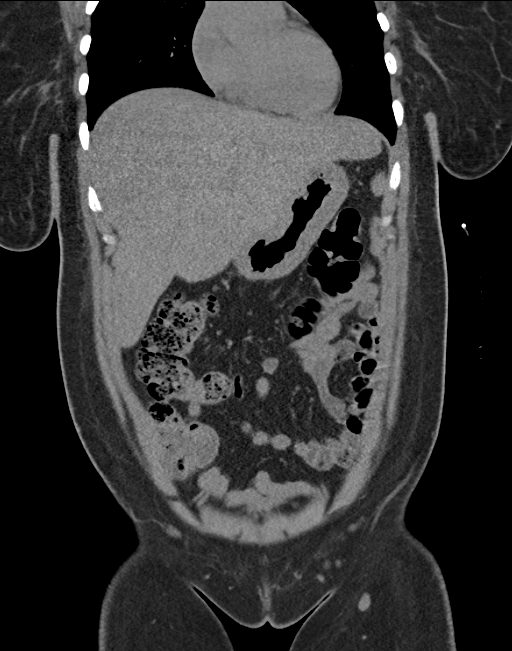
[im 45/102  soft-tissue]
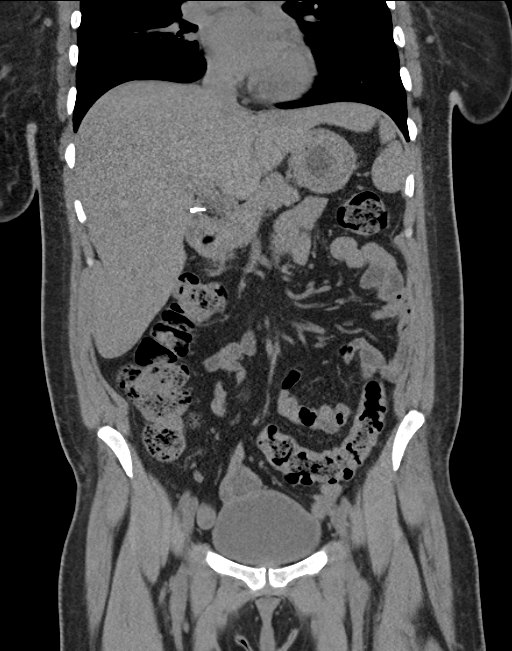
[im 57/102  soft-tissue]
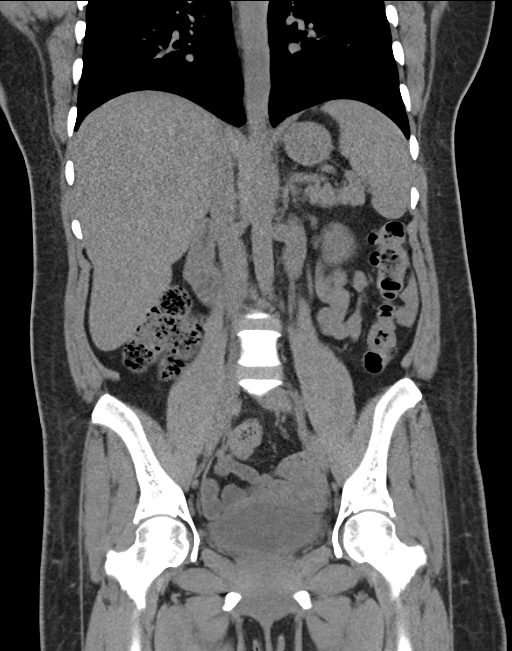

[17 of 46 positions shown; findings below may reference images not displayed]

FINDINGS: Lung bases are normal.

Abdominal images demonstrate evidence of a previous cholecystectomy.
The appendix is normal. Kidneys are normal in size without
hydronephrosis or nephrolithiasis. Ureters are within normal. Small
bowel and colon are within normal. No free fluid or focal
inflammatory change noted.

Pelvic images demonstrate the bladder rectosigmoid colon to be
within normal. Surgical absence of the uterus. Remaining bones and
soft tissues are within normal.
IMPRESSION: No acute findings in the abdomen/pelvis.

## 2017-09-02 ENCOUNTER — Encounter (HOSPITAL_COMMUNITY): Payer: Self-pay | Admitting: Emergency Medicine

## 2017-09-02 ENCOUNTER — Emergency Department (HOSPITAL_COMMUNITY)
Admission: EM | Admit: 2017-09-02 | Discharge: 2017-09-02 | Disposition: A | Discharge status: Home / Self Care | Payer: No Typology Code available for payment source | Attending: Emergency Medicine | Admitting: Emergency Medicine

## 2017-09-02 ENCOUNTER — Other Ambulatory Visit: Payer: Self-pay

## 2017-09-02 DIAGNOSIS — Z794 Long term (current) use of insulin: Secondary | ICD-10-CM | POA: Insufficient documentation

## 2017-09-02 DIAGNOSIS — Z79899 Other long term (current) drug therapy: Secondary | ICD-10-CM | POA: Insufficient documentation

## 2017-09-02 DIAGNOSIS — E1142 Type 2 diabetes mellitus with diabetic polyneuropathy: Secondary | ICD-10-CM | POA: Insufficient documentation

## 2017-09-02 DIAGNOSIS — F329 Major depressive disorder, single episode, unspecified: Secondary | ICD-10-CM | POA: Diagnosis present

## 2017-09-02 DIAGNOSIS — N898 Other specified noninflammatory disorders of vagina: Secondary | ICD-10-CM | POA: Diagnosis not present

## 2017-09-02 DIAGNOSIS — F17210 Nicotine dependence, cigarettes, uncomplicated: Secondary | ICD-10-CM | POA: Insufficient documentation

## 2017-09-02 DIAGNOSIS — I10 Essential (primary) hypertension: Secondary | ICD-10-CM | POA: Diagnosis not present

## 2017-09-02 DIAGNOSIS — F3289 Other specified depressive episodes: Secondary | ICD-10-CM

## 2017-09-02 LAB — URINALYSIS, ROUTINE W REFLEX MICROSCOPIC
Bilirubin Urine: NEGATIVE
Glucose, UA: >=500 mg/dL — AB
HGB URINE DIPSTICK: NEGATIVE
Ketones, ur: 20 mg/dL — AB
LEUKOCYTES UA: NEGATIVE
Nitrite: NEGATIVE
PROTEIN: 100 mg/dL — AB
SPECIFIC GRAVITY, URINE: 1.023 (ref 1.005–1.030)
pH: 5 (ref 5.0–8.0)

## 2017-09-02 LAB — RAPID URINE DRUG SCREEN, HOSP PERFORMED
AMPHETAMINES: NOT DETECTED
Barbiturates: NOT DETECTED
Benzodiazepines: NOT DETECTED
COCAINE: NOT DETECTED
Opiates: NOT DETECTED
TETRAHYDROCANNABINOL: POSITIVE — AB

## 2017-09-02 LAB — WET PREP, GENITAL
Sperm: NONE SEEN
Trich, Wet Prep: NONE SEEN
Yeast Wet Prep HPF POC: NONE SEEN

## 2017-09-02 LAB — COMPREHENSIVE METABOLIC PANEL
ALBUMIN: 4.2 g/dL (ref 3.5–5.0)
ALT: 15 U/L (ref 14–54)
AST: 18 U/L (ref 15–41)
Alkaline Phosphatase: 84 U/L (ref 38–126)
Anion gap: 10 (ref 5–15)
BUN: 5 mg/dL — AB (ref 6–20)
CHLORIDE: 100 mmol/L — AB (ref 101–111)
CO2: 23 mmol/L (ref 22–32)
Calcium: 10.6 mg/dL — ABNORMAL HIGH (ref 8.9–10.3)
Creatinine, Ser: 0.64 mg/dL (ref 0.44–1.00)
GFR calc Af Amer: >60 mL/min (ref >60)
GLUCOSE: 271 mg/dL — AB (ref 65–99)
Potassium: 3.4 mmol/L — ABNORMAL LOW (ref 3.5–5.1)
SODIUM: 133 mmol/L — AB (ref 135–145)
Total Bilirubin: 0.6 mg/dL (ref 0.3–1.2)
Total Protein: 8.1 g/dL (ref 6.5–8.1)

## 2017-09-02 LAB — CBC
HCT: 44.1 % (ref 36.0–46.0)
HEMOGLOBIN: 15.2 g/dL — AB (ref 12.0–15.0)
MCH: 29.7 pg (ref 26.0–34.0)
MCHC: 34.5 g/dL (ref 30.0–36.0)
MCV: 86.3 fL (ref 78.0–100.0)
Platelets: 294 10*3/uL (ref 150–400)
RBC: 5.11 MIL/uL (ref 3.87–5.11)
RDW: 13.8 % (ref 11.5–15.5)
WBC: 10.2 10*3/uL (ref 4.0–10.5)

## 2017-09-02 LAB — ETHANOL

## 2017-09-02 MED ORDER — FLUCONAZOLE 100 MG PO TABS
200.0000 mg | ORAL_TABLET | Freq: Once | ORAL | Status: AC
  Administered 2017-09-02: 200 mg via ORAL
  Filled 2017-09-02: qty 2

## 2017-09-02 MED ORDER — INSULIN GLARGINE 100 UNIT/ML ~~LOC~~ SOLN: 60.0000 [IU] | Freq: Two times a day (BID) | SUBCUTANEOUS | 0 refills | Status: DC

## 2017-09-02 MED ORDER — LISINOPRIL 20 MG PO TABS: 20.0000 mg | ORAL_TABLET | Freq: Every day | ORAL | 0 refills | Status: DC

## 2017-09-02 MED ORDER — CEFTRIAXONE SODIUM 250 MG IJ SOLR
250.0000 mg | Freq: Once | INTRAMUSCULAR | Status: AC
  Administered 2017-09-02: 250 mg via INTRAMUSCULAR
  Filled 2017-09-02: qty 250

## 2017-09-02 MED ORDER — LIDOCAINE HCL (PF) 1 % IJ SOLN
5.0000 mL | Freq: Once | INTRAMUSCULAR | Status: AC
  Administered 2017-09-02: 5 mL via INTRADERMAL
  Filled 2017-09-02: qty 5

## 2017-09-02 MED ORDER — AZITHROMYCIN 250 MG PO TABS
1000.0000 mg | ORAL_TABLET | Freq: Once | ORAL | Status: AC
Start: 2017-09-02 — End: 2017-09-02
  Administered 2017-09-02: 1000 mg via ORAL
  Filled 2017-09-02: qty 4

## 2017-09-02 MED ORDER — ERGOCALCIFEROL 1.25 MG (50000 UT) PO CAPS: 50000.0000 [IU] | ORAL_CAPSULE | ORAL | 0 refills | Status: DC

## 2017-09-02 MED ORDER — METFORMIN HCL 500 MG PO TABS: 1000.0000 mg | ORAL_TABLET | Freq: Two times a day (BID) | ORAL | 0 refills | Status: DC

## 2017-09-02 MED ORDER — ATORVASTATIN CALCIUM 20 MG PO TABS: 20.0000 mg | ORAL_TABLET | Freq: Every day | ORAL | 0 refills | Status: DC

## 2017-09-02 MED ORDER — OMEPRAZOLE 20 MG PO CPDR: 20.0000 mg | DELAYED_RELEASE_CAPSULE | Freq: Every day | ORAL | 0 refills | Status: DC

## 2017-09-02 NOTE — ED Notes (Signed)
Patient verbalizes understanding of discharge instructions. Opportunity for questioning and answers were provided. 
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
## 2017-09-02 NOTE — Discharge Instructions (Signed)
Return here as needed follow-up with the resources provided. °

## 2017-09-02 NOTE — Progress Notes (Signed)
CSW spoke with pt at bedside. CSW provided pt with outpatient mental health providers and resources. Per pt, pt has been with [facility] in the past, but isn't want further services with this company at this time. There are no further CSW intervention needed at this time. CSW signing off.    Claude MangesKierra S. Jacole Capley, MSW, LCSW-A Emergency Department Clinical Social Worker [phone]

## 2017-09-02 NOTE — Progress Notes (Signed)
Type of Service:Brief Assessment    SUBJECTIVE: Vanessa Case is a 46 y.o. female referred by Doctor for: Outpatient Mental Health Providers  symptoms of  Bipolar II Disorder  Stressors related to: yes looking for a job. Lived in a homeless shelter for 8 months. Getting daughter back on her meds.  Patient was accompanied by no one. Patient reports the following symptoms/concerns: Not being able to get  A job.   Duration of problem:  8 months   LIFE CONTEXT:  Family & Social:lives with daughter and best friend Vanessa Bloom([place])  School/ Work: none  Life changes: being homeless and not being on medication.     INTERVENTION:   Reflective listening and Psychoeducationa   ISSUES DISCUSSED: being homeless and not having a job as well as other stressors in life.   ASSESSMENT:Patient currently experiencing stressors associated with not finding a job and not being on medications since 2000. Symptoms exacerbated by episodes of bipolar and crying. Patient may benefit from one on one therapy , and is in agreement to receive it through a mental health provider.      PLAN: No further intervention required at this time Referral:outpatient mental health providers      Claude MangesKierra S. Wylan Gentzler, MSW, LCSW-A Emergency Department Clinical Social Worker [phone]

## 2017-09-02 NOTE — Discharge Planning (Signed)
EDCM contacted to assist with follow-up appointment.  Pt has Medicaid Insurance with an assigned PCP.  EDCM explained to pt that she must contact DSS to have PCP changed in order to be seen by another MD.  Pt verbalizes understanding. Tarrence Enck J. Chieko Neises, RN, BSN, NCM [phone]   

## 2017-09-03 LAB — GC/CHLAMYDIA PROBE AMP (~~LOC~~) NOT AT ARMC
Chlamydia: NEGATIVE
Neisseria Gonorrhea: NEGATIVE

## 2017-09-08 NOTE — ED Provider Notes (Signed)
[facility] EMERGENCY DEPARTMENT Provider Note   CSN: [id number] Arrival date & time: 09/02/17  0706     History   Chief Complaint Chief Complaint  Patient presents with  . Depression    Denies SI  . Vaginal Discharge    HPI Vanessa Case is a 46 y.o. female.  HPI Patient presents to the emergency department with requests for her medications be refilled and vaginal discharge.  The patient states that the vaginal discharge been going on 1 month and she feels that it is a yeast infection.  Patient states that nothing seems make the condition better or worse.  Patient states that she does not take any other medications prior to arrival for symptoms.  The patient denies chest pain, shortness of breath, headache,blurred vision, neck pain, fever, cough, weakness, numbness, dizziness, anorexia, edema, abdominal pain, nausea, vomiting, diarrhea, rash, back pain, dysuria, hematemesis, bloody stool, near syncope, or syncope. Past Medical History:  Diagnosis Date  . Anemia   . Anxiety   . Arthritis   . Chronic abdominal pain   . Chronic back pain   . Chronic headache   . Chronic neck pain   . Depression   . Diabetes mellitus    adult onset dm-maintained on glipizide and metformin, pt states fasting glucose runs 110  . Fibromyalgia   . GERD (gastroesophageal reflux disease)   . Hyperlipidemia   . Hypertension    maintained on lisinopril hct, metoprolol-134/86 at pat visit  . Neuromuscular disorder (HCC)   . Neuropathy   . Obesity   . Substance abuse [facility])     Patient Active Problem List   Diagnosis Date Noted  . Suicidal ideation 07/20/2015  . Major depressive disorder, recurrent, severe without psychotic features (HCC)   . Type 2 diabetes mellitus with diabetic polyneuropathy (HCC) 05/30/2015  . Other fatigue 05/30/2015  . Essential hypertension 05/30/2015  . HLD (hyperlipidemia) 05/30/2015  . Bipolar 2 disorder, major depressive episode (HCC)  09/20/2014  . Cholelithiasis with acute cholecystitis 06/28/2013  . Right arm weakness 03/05/2013    Past Surgical History:  Procedure Laterality Date  . CESAREAN SECTION  x3  . HYSTERECTOMY ABDOMINAL N/A 10/30/2011   Performed by Bing Plume, MD at [facility]  . LAPAROSCOPIC CHOLECYSTECTOMY N/A 06/28/2013   Performed by Abigail Miyamoto, MD at [facility] OR  . SALPINGO OOPHORECTOMY Right 10/30/2011   Performed by Bing Plume, MD at [facility]S  . UNILATERAL SALPINGECTOMY Left 10/30/2011   Performed by Bing Plume, MD at [facility] ORS  . WRIST SURGERY     carpel tunnel and tendonitis    OB History    Gravida Para Term Preterm AB Living   3 3 3     3    SAB TAB Ectopic Multiple Live Births                   Home Medications    Prior to Admission medications   Medication Sig Start Date End Date Taking? Authorizing Provider  FLUoxetine (PROZAC) 20 MG capsule Take 20 mg by mouth daily.   Yes [provider]  hydrOXYzine (ATARAX/VISTARIL) 50 MG tablet Take 1 tablet (50 mg total) by mouth 3 (three) times daily as needed for anxiety. 07/25/15  Yes Withrow, Everardo All, FNP  ibuprofen (ADVIL,MOTRIN) 600 MG tablet Take 1 tablet (600 mg total) by mouth every 6 (six) hours as needed for mild pain or moderate pain. 07/18/16  Yes Trixie Dredge, PA-C  prazosin (MINIPRESS) 1 MG capsule Take 1 capsule (1 mg total) by mouth at bedtime. To prevent nightmares 07/25/15  Yes Withrow, Everardo AllJohn C, FNP  traZODone (DESYREL) 50 MG tablet Take 1 tablet (50 mg total) by mouth at bedtime as needed for sleep. Patient taking differently: Take 50 mg at bedtime by mouth.  07/25/15  Yes Withrow, Everardo AllJohn C, FNP  atorvastatin (LIPITOR) 20 MG tablet Take 1 tablet (20 mg total) daily by mouth. 09/02/17   Kianah Harries, Cristal Deerhristopher, PA-C  cyclobenzaprine (FLEXERIL) 10 MG tablet Take 1 tablet (10 mg total) by mouth 3 (three) times daily as needed for muscle spasms (and pain). Patient not taking: Reported on 09/02/2017 07/18/16   Trixie DredgeWest, Emily, PA-C    ergocalciferol (VITAMIN D2) 50000 units capsule Take 1 capsule (50,000 Units total) once a week by mouth. 09/02/17   Dinesh Ulysse, Cristal Deerhristopher, PA-C  famotidine (PEPCID) 20 MG tablet Take 1 tablet (20 mg total) by mouth 2 (two) times daily. Patient not taking: Reported on 09/02/2017 05/14/16   Jacalyn LefevreHaviland, Julie, MD  fluticasone [facility](FLOVENT HFA) 44 MCG/ACT inhaler Inhale 2 puffs into the lungs 2 (two) times daily as needed (for shortness of breath). Patient not taking: Reported on 09/02/2017 03/14/16   Benjiman CorePickering, Nathan, MD  gabapentin (NEURONTIN) 400 MG capsule Take 1 capsule (400 mg total) by mouth 3 (three) times daily. Patient not taking: Reported on 09/02/2017 03/14/16   Benjiman CorePickering, Nathan, MD  HUMALOG MIX 75/25 (75-25) 100 UNIT/ML SUSP injection Inject 60 Units into the skin 2 (two) times daily. Patient not taking: Reported on 03/14/2016 07/25/15   Withrow, Everardo AllJohn C, FNP  insulin aspart (NOVOLOG) 100 UNIT/ML injection Inject 0-20 Units into the skin 3 (three) times daily with meals. USE SLIDING SCALE AT HOME Patient not taking: Reported on 03/14/2016 07/25/15   Withrow, Everardo AllJohn C, FNP  insulin glargine (LANTUS) 100 UNIT/ML injection Inject 0.6 mLs (60 Units total) 2 (two) times daily into the skin. 09/02/17   Samona Chihuahua, Cristal Deerhristopher, PA-C  lidocaine (LIDODERM) 5 % Place 1 patch onto the skin daily. Remove & Discard patch within 12 hours or as directed by MD Apply the patch to the Right side of the neck. Patient not taking: Reported on 03/14/2016 07/25/15   Beau FannyWithrow, John C, FNP  lisinopril (PRINIVIL,ZESTRIL) 20 MG tablet Take 1 tablet (20 mg total) daily by mouth. 09/02/17   Sotiria Keast, Cristal Deerhristopher, PA-C  lovastatin (MEVACOR) 40 MG tablet Take 1 tablet (40 mg total) by mouth at bedtime. Patient not taking: Reported on 03/14/2016 07/25/15   Beau FannyWithrow, John C, FNP  metFORMIN (GLUCOPHAGE) 500 MG tablet Take 2 tablets (1,000 mg total) 2 (two) times daily with a meal by mouth. 09/02/17   Breyon Sigg, Cristal Deerhristopher, PA-C  metroNIDAZOLE  (FLAGYL) 500 MG tablet Take 1 tablet (500 mg total) by mouth 2 (two) times daily. Patient not taking: Reported on 09/02/2017 05/14/16   Jacalyn LefevreHaviland, Julie, MD  nicotine (NICODERM CQ - DOSED IN MG/24 HOURS) 21 mg/24hr patch Place 1 patch (21 mg total) onto the skin daily at 6 (six) AM. Patient not taking: Reported on 03/14/2016 07/25/15   Beau FannyWithrow, John C, FNP  omeprazole (PRILOSEC) 20 MG capsule Take 1 capsule (20 mg total) daily by mouth. 09/02/17   Audris Speaker, Cristal Deerhristopher, PA-C  PARoxetine (PAXIL) 20 MG tablet Take 1 tablet (20 mg total) by mouth daily. Patient not taking: Reported on 03/14/2016 07/25/15   Beau FannyWithrow, John C, FNP    Family History Family History  Problem Relation Age of Onset  . Diabetes Mother   .  Cystic fibrosis Father   . Diabetes Maternal Grandmother   . Heart disease Maternal Grandfather     Social History Social History   Tobacco Use  . Smoking status: Current Every Day Smoker    Packs/day: 0.50    Last attempt to quit: 10/24/1997    Years since quitting: 19.8  . Smokeless tobacco: Never Used  . Tobacco comment: Smoking 1 pp week  Substance Use Topics  . Alcohol use: No    Alcohol/week: 0.0 oz  . 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 All pertinent positives and negatives as reviewed in the HPI. Physical Exam Updated Vital Signs BP (!) 155/106 (BP Location: Right Arm)   Pulse 97   Temp 98.4 F (36.9 C) (Oral)   Resp 18   Ht 5\' 5"  (1.651 m)   Wt 78 kg (172 lb)   LMP 10/07/2011   SpO2 100%   BMI 28.62 kg/m   Physical Exam  Constitutional: She is oriented to person, place, and time. She appears well-developed and well-nourished. No distress.  HENT:  Head: Normocephalic and atraumatic.  Mouth/Throat: Oropharynx is clear and moist.  Eyes:  Pupils are equal, round, and reactive to light.  Neck: Normal range of motion. Neck supple.  Cardiovascular: Normal rate, regular rhythm and normal heart sounds. Exam reveals no gallop and no friction rub.  No murmur heard. Pulmonary/Chest: Effort normal and breath sounds normal. No respiratory distress. She has no wheezes.  Abdominal: Soft. Bowel sounds are normal. She exhibits no distension. There is no tenderness.  Genitourinary: Rectal exam shows guaiac negative stool. Vaginal discharge found.  Neurological: She is alert and oriented to person, place, and time. She exhibits normal muscle tone. Coordination normal.  Skin: Skin is warm and dry. Capillary refill takes less than 2 seconds. No rash noted. No erythema.  Psychiatric: She has a normal mood and affect. Her behavior is normal.  Nursing note and vitals reviewed.    ED Treatments / Results  Labs (all labs ordered are listed, but only abnormal results are displayed) Labs Reviewed  WET PREP, GENITAL - Abnormal; Notable for the following components:      Result Value   Clue Cells Wet Prep HPF POC PRESENT (*)    WBC, Wet Prep HPF POC FEW (*)    All other components within normal limits  COMPREHENSIVE METABOLIC PANEL - Abnormal; Notable for the following components:   Sodium 133 (*)    Potassium 3.4 (*)    Chloride 100 (*)    Glucose, Bld 271 (*)    BUN 5 (*)    Calcium 10.6 (*)    All other components within normal limits  CBC - Abnormal; Notable for the following components:   Hemoglobin 15.2 (*)    All other components within normal limits  RAPID URINE DRUG SCREEN, HOSP PERFORMED - Abnormal; Notable for the following components:   Tetrahydrocannabinol POSITIVE (*)    All other components within normal limits  URINALYSIS, ROUTINE W REFLEX MICROSCOPIC - Abnormal; Notable for the following components:   APPearance HAZY (*)    Glucose, UA >=500 (*)    Ketones, ur 20 (*)    Protein, ur 100 (*)    Bacteria, UA RARE (*)     Squamous Epithelial / LPF 0-5 (*)  All other components within normal limits  ETHANOL  GC/CHLAMYDIA PROBE AMP () NOT AT [facility]    EKG  EKG Interpretation None       Radiology No results found.  Procedures Procedures (including critical care time)  Medications Ordered in ED Medications  fluconazole (DIFLUCAN) tablet 200 mg (200 mg Oral Given 09/02/17 1418)  cefTRIAXone (ROCEPHIN) injection 250 mg (250 mg Intramuscular Given 09/02/17 1420)  azithromycin (ZITHROMAX) tablet 1,000 mg (1,000 mg Oral Given 09/02/17 1418)  lidocaine (PF) (XYLOCAINE) 1 % injection 5 mL (5 mLs Intradermal Given 09/02/17 1420)     Initial Impression / Assessment and Plan / ED Course  I have reviewed the triage vital signs and the nursing notes.  Pertinent labs & imaging results that were available during my care of the patient were reviewed by me and considered in my medical decision making (see chart for details).    She was treated for her vaginal discharge I have also refilled her primary medications she will have to go see her behavioral health specialist for any further prescriptions. Final Clinical Impressions(s) / ED Diagnoses   Final diagnoses:  Other depression  Vaginal discharge    ED Discharge Orders        Ordered    atorvastatin (LIPITOR) 20 MG tablet  Daily     09/02/17 1440    ergocalciferol (VITAMIN D2) 50000 units capsule  Weekly     09/02/17 1440    insulin glargine (LANTUS) 100 UNIT/ML injection  2 times daily     09/02/17 1440    lisinopril (PRINIVIL,ZESTRIL) 20 MG tablet  Daily     09/02/17 1440    metFORMIN (GLUCOPHAGE) 500 MG tablet  2 times daily with meals     09/02/17 1440    omeprazole (PRILOSEC) 20 MG capsule  Daily     09/02/17 1440       Charlestine Night, PA-C 09/08/17 1553    Melene Plan, DO 09/10/17 1508

## 2017-10-02 IMAGING — DX DG CHEST 2V
2 series · 2 of 2 positions shown · non-contrast
Comparison: 07/12/2013

CLINICAL DATA: Chest tightness for 2 weeks.

EXAM:
CHEST  2 VIEW

[w chest pa]
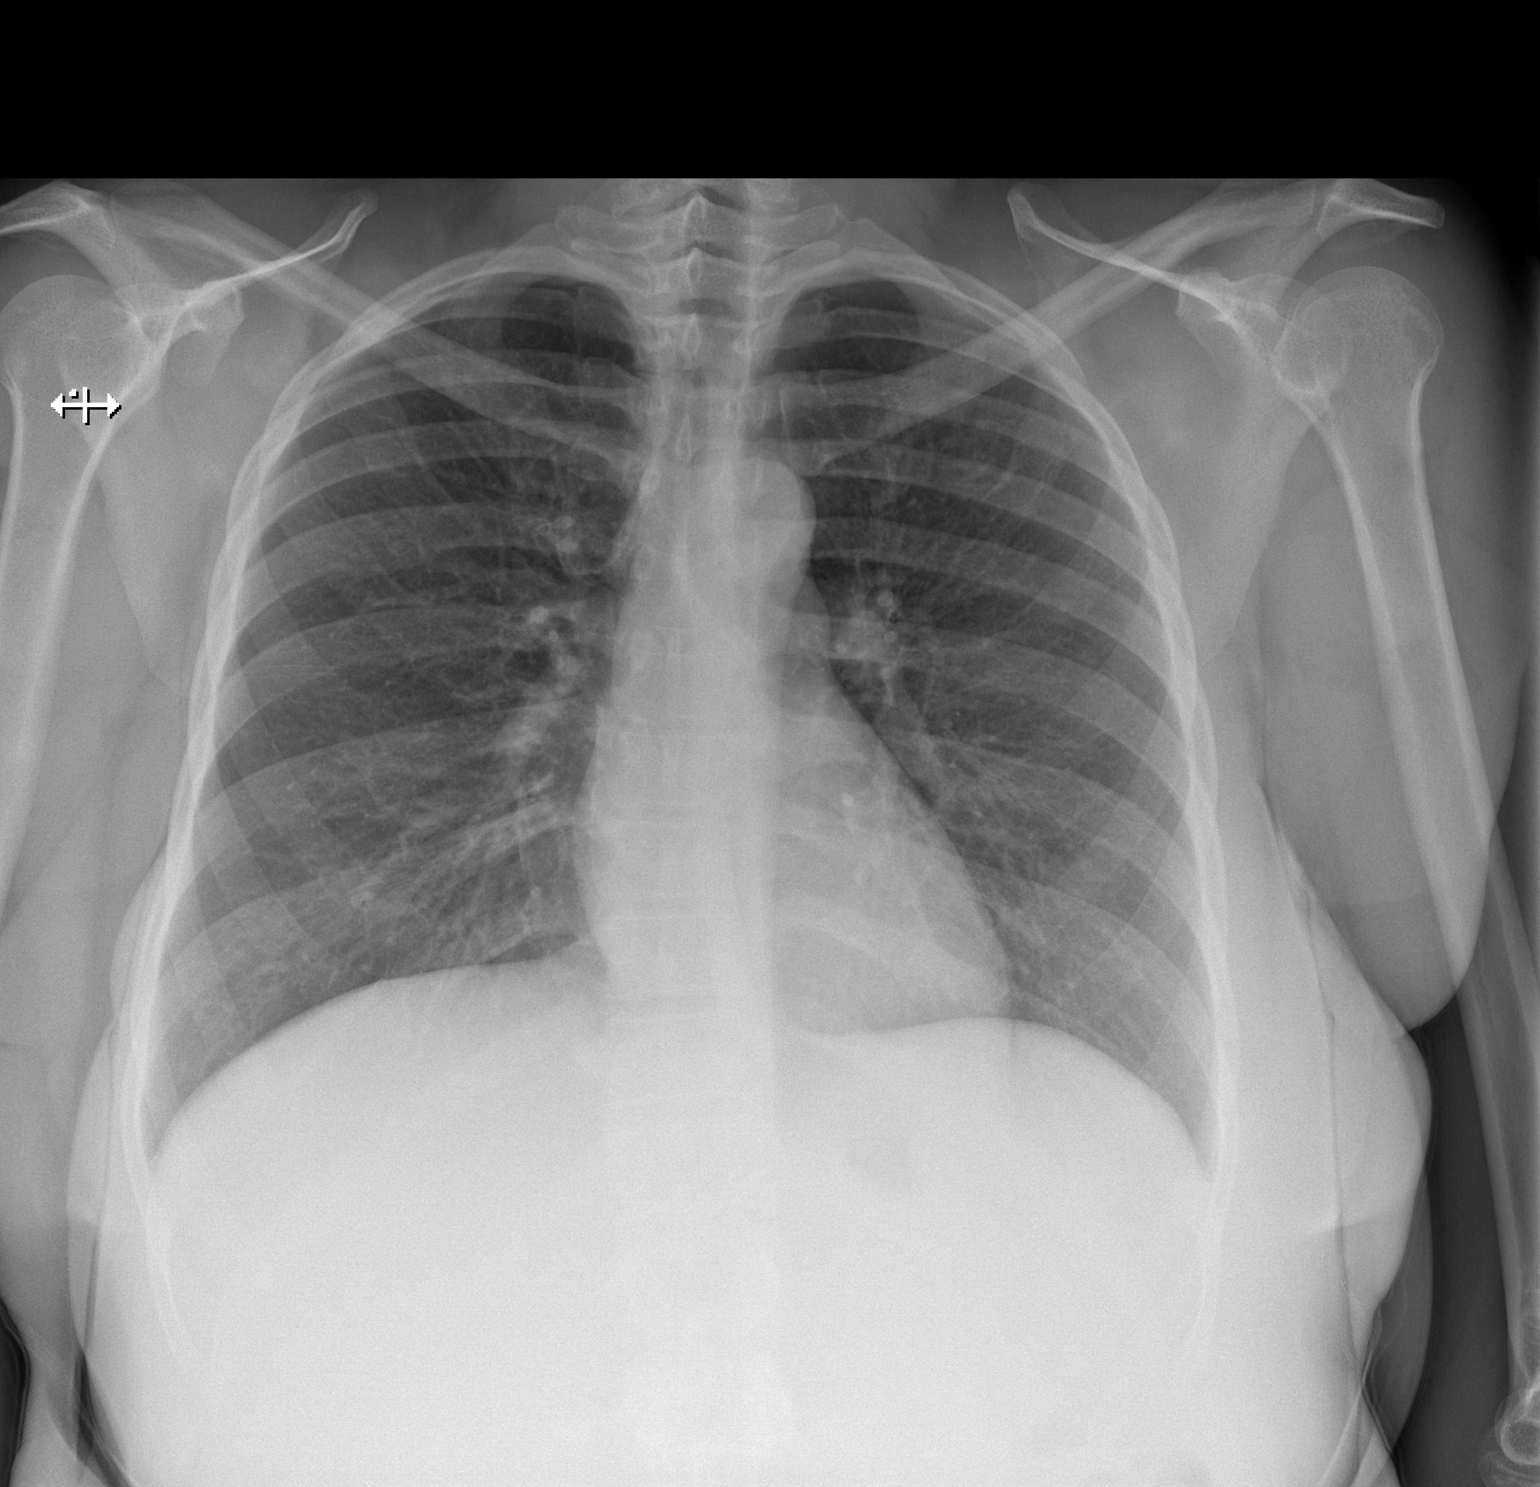

[w chest lat]
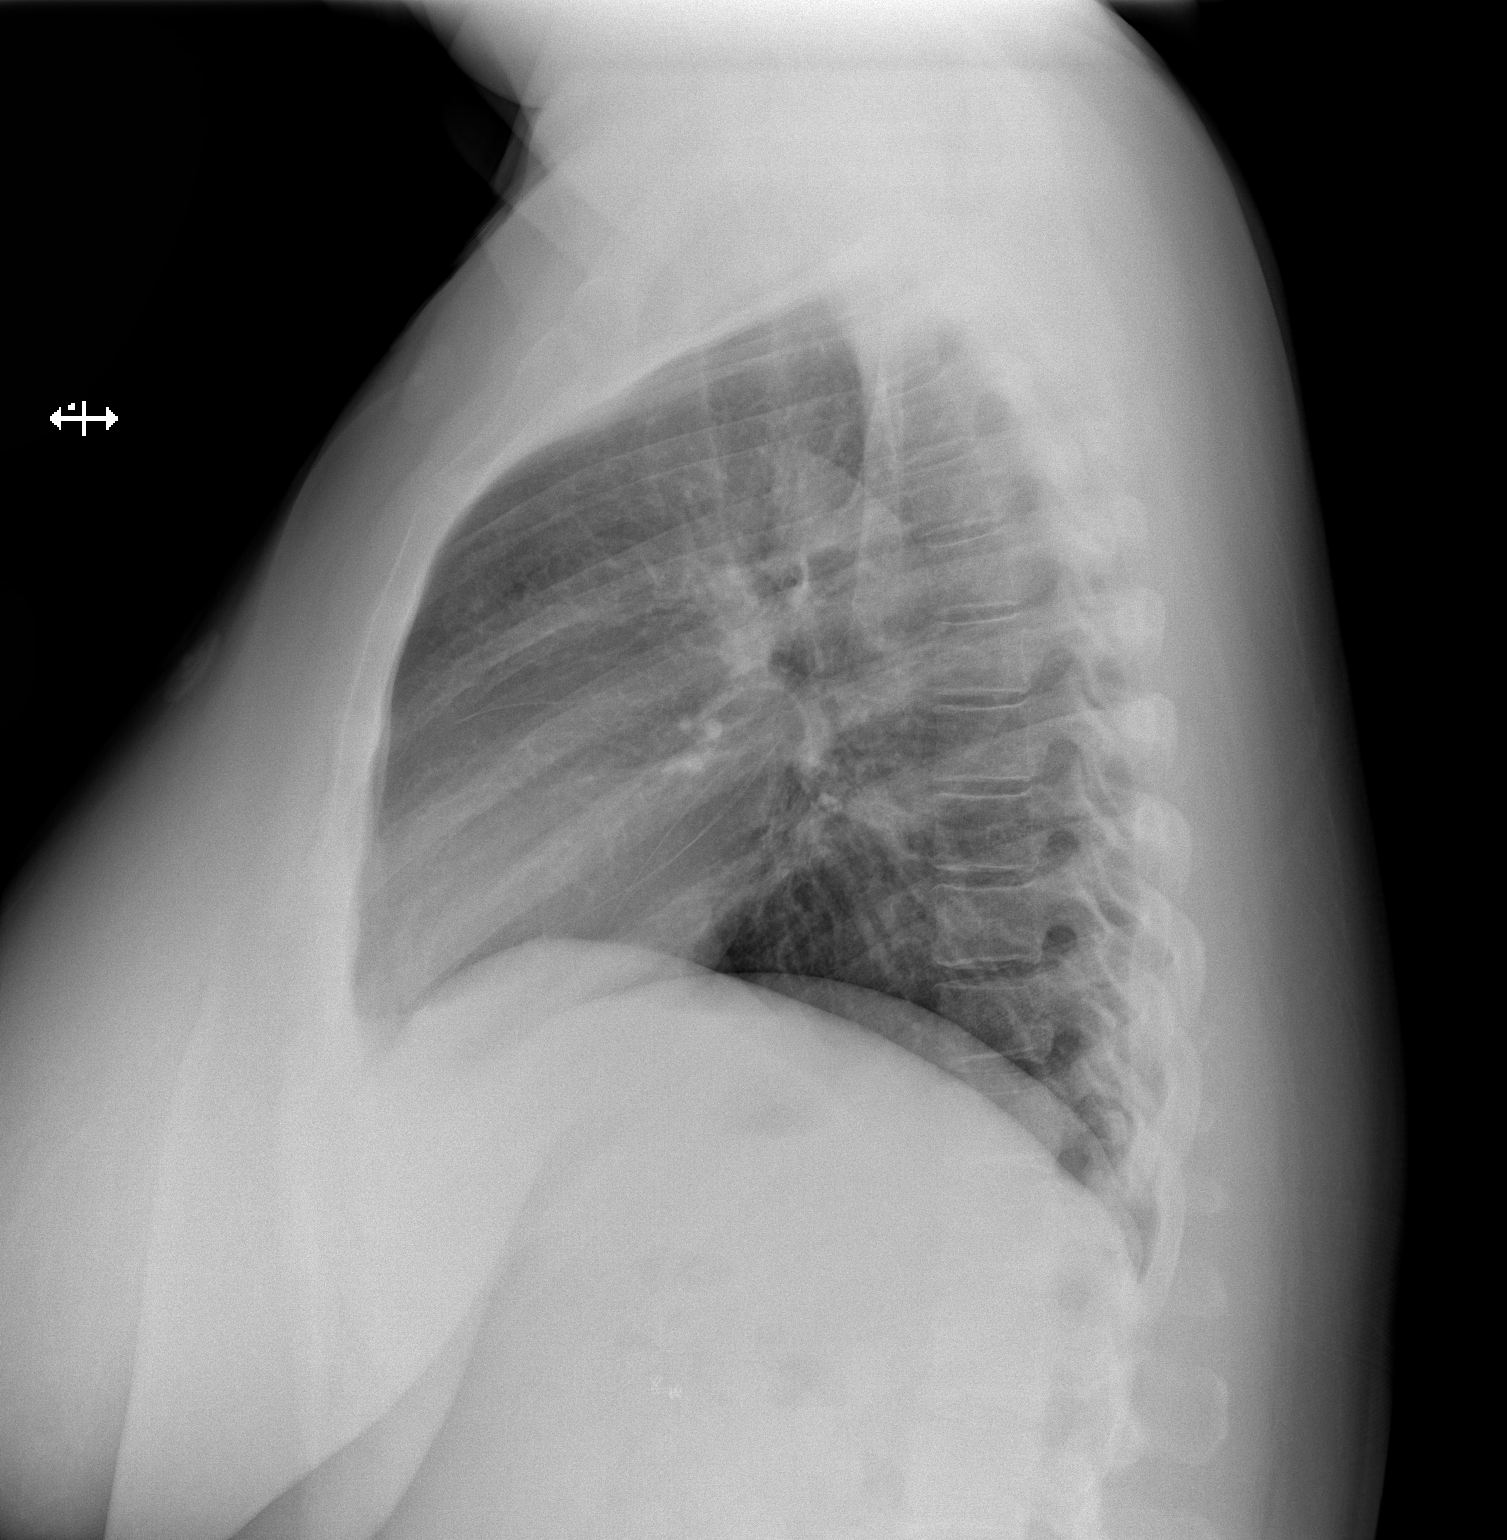

[2 of 2 positions shown; findings below may reference images not displayed]

FINDINGS: The heart size and mediastinal contours are within normal limits.
Both lungs are clear. The visualized skeletal structures are
unremarkable.
IMPRESSION: No active cardiopulmonary disease.

## 2018-01-15 ENCOUNTER — Encounter: Payer: Self-pay | Admitting: Gastroenterology

## 2018-01-15 ENCOUNTER — Other Ambulatory Visit (INDEPENDENT_AMBULATORY_CARE_PROVIDER_SITE_OTHER): Payer: Self-pay

## 2018-01-15 ENCOUNTER — Ambulatory Visit (INDEPENDENT_AMBULATORY_CARE_PROVIDER_SITE_OTHER): Payer: Self-pay | Admitting: Gastroenterology

## 2018-01-15 VITALS — BP 124/88 | HR 72 | Ht 65.0 in | Wt 179.1 lb

## 2018-01-15 DIAGNOSIS — R1084 Generalized abdominal pain: Secondary | ICD-10-CM

## 2018-01-15 DIAGNOSIS — R198 Other specified symptoms and signs involving the digestive system and abdomen: Secondary | ICD-10-CM

## 2018-01-15 DIAGNOSIS — K219 Gastro-esophageal reflux disease without esophagitis: Secondary | ICD-10-CM

## 2018-01-15 LAB — COMPREHENSIVE METABOLIC PANEL
ALBUMIN: 4 g/dL (ref 3.5–5.2)
ALT: 20 U/L (ref 0–35)
AST: 17 U/L (ref 0–37)
Alkaline Phosphatase: 81 U/L (ref 39–117)
BUN: 6 mg/dL (ref 6–23)
CHLORIDE: 103 meq/L (ref 96–112)
CO2: 27 mEq/L (ref 19–32)
CREATININE: 0.6 mg/dL (ref 0.40–1.20)
Calcium: 10.4 mg/dL (ref 8.4–10.5)
GFR: 139.5 mL/min (ref >60.00)
Glucose, Bld: 321 mg/dL — ABNORMAL HIGH (ref 70–99)
POTASSIUM: 3.9 meq/L (ref 3.5–5.1)
Sodium: 139 mEq/L (ref 135–145)
Total Bilirubin: 0.3 mg/dL (ref 0.2–1.2)
Total Protein: 7.2 g/dL (ref 6.0–8.3)

## 2018-01-15 LAB — CBC WITH DIFFERENTIAL/PLATELET
BASOS PCT: 0.8 % (ref 0.0–3.0)
Basophils Absolute: 0.1 10*3/uL (ref 0.0–0.1)
EOS PCT: 1.5 % (ref 0.0–5.0)
Eosinophils Absolute: 0.1 10*3/uL (ref 0.0–0.7)
HEMATOCRIT: 39.9 % (ref 36.0–46.0)
HEMOGLOBIN: 13.3 g/dL (ref 12.0–15.0)
LYMPHS PCT: 45.7 % (ref 12.0–46.0)
Lymphs Abs: 3.3 10*3/uL (ref 0.7–4.0)
MCHC: 33.4 g/dL (ref 30.0–36.0)
MCV: 86.9 fl (ref 78.0–100.0)
Monocytes Absolute: 0.3 10*3/uL (ref 0.1–1.0)
Monocytes Relative: 4.6 % (ref 3.0–12.0)
NEUTROS ABS: 3.5 10*3/uL (ref 1.4–7.7)
Neutrophils Relative %: 47.4 % (ref 43.0–77.0)
PLATELETS: 298 10*3/uL (ref 150.0–400.0)
RBC: 4.59 Mil/uL (ref 3.87–5.11)
RDW: 12.7 % (ref 11.5–15.5)
WBC: 7.3 10*3/uL (ref 4.0–10.5)

## 2018-01-15 LAB — IGA: IGA: 153 mg/dL (ref 68–378)

## 2018-01-15 LAB — TSH: TSH: 0.25 u[IU]/mL — ABNORMAL LOW (ref 0.35–4.50)

## 2018-01-15 MED ORDER — DICYCLOMINE HCL 10 MG PO CAPS: 10.0000 mg | ORAL_CAPSULE | Freq: Three times a day (TID) | ORAL | 11 refills | Status: DC

## 2018-01-15 NOTE — Patient Instructions (Signed)
If you are age 165 or older, your body mass index should be between 23-30. Your Body mass index is 29.81 kg/m. If this is out of the aforementioned range listed, please consider follow up with your Primary Care Provider.  If you are age 45 or younger, your body mass index should be between 19-25. Your Body mass index is 29.81 kg/m. If this is out of the aformentioned range listed, please consider follow up with your Primary Care Provider.   Your physician has requested that you go to the basement for lab work before leaving today.  We have sent the following medications to your pharmacy for you to pick up at your convenience: Bentyl 10mg  before meals and at bedtime.   Thank you for choosing [facility]  Dr Russella DarStark

## 2018-01-15 NOTE — Progress Notes (Signed)
History of Present Illness: This is a 47 year old female referred by Cari Carawayonna Odem, F-NP for the evaluation of alternating diarrhea and constipation associated with abdominal pain and bloating.  She states these symptoms began 6 months after her cholecystectomy which was performed in September 2014 and they have not changed since 2015.  She has loose, urgent stools following meals associated with abdominal pain and abdominal bloating. At times she has constipation for several days associated with bloating.  There is information in her referral records that indicates a possible history of Crohn's disease however the patient does not ever recall being informed of Crohn's disease or having any endoscopic studies performed.  She does not ever recall seeing a gastroenterologist.  She takes MiraLAX when constipated.  Her diabetes has not been well controlled with blood sugars frequently over 200.  She relates about a 10 pound weight loss.  She has GERD that is well controlled on omeprazole.  Denies change in stool caliber, melena, hematochezia, nausea, vomiting, dysphagia, chest pain.   Abd/pelvic CTA 09/2014:  1. There is no evidence of thoracic aortic aneurysm or aortic dissection. 2. No mediastinal hematoma or adenopathy. 3. No acute infiltrate or pulmonary edema. 4. No abdominal aortic aneurysm or dissection. Patent celiac trunk and SMA, patent bilateral renal artery, splenic artery and hepatic artery. 5. Normal appendix.  No pericecal inflammation. 6. No small bowel obstruction. 7. No hydronephrosis or hydroureter. 8. Surgically absent uterus. Status postcholecystectomy. There is a cyst/follicle within left ovary measures 2.5 cm.   Allergies  Allergen Reactions  . Percocet [Oxycodone-Acetaminophen] Hives, Itching, Palpitations and Other (See Comments)    nightmares  . Ambien [Zolpidem] Other (See Comments)    Sleepwalking episodes  . Hydrocodone Nausea And Vomiting and Rash   Outpatient  Medications Prior to Visit  Medication Sig Dispense Refill  . atorvastatin (LIPITOR) 20 MG tablet Take 1 tablet (20 mg total) daily by mouth. 30 tablet 0  . cyclobenzaprine (FLEXERIL) 10 MG tablet Take 1 tablet (10 mg total) by mouth 3 (three) times daily as needed for muscle spasms (and pain). 10 tablet 0  . ergocalciferol (VITAMIN D2) 50000 units capsule Take 1 capsule (50,000 Units total) once a week by mouth. 4 capsule 0  . FLUoxetine (PROZAC) 20 MG capsule Take 20 mg by mouth daily.    . fluticasone (FLOVENT HFA) 44 MCG/ACT inhaler Inhale 2 puffs into the lungs 2 (two) times daily as needed (for shortness of breath). 1 Inhaler 0  . gabapentin (NEURONTIN) 400 MG capsule Take 1 capsule (400 mg total) by mouth 3 (three) times daily. 90 capsule 0  . HUMALOG MIX 75/25 (75-25) 100 UNIT/ML SUSP injection Inject 60 Units into the skin 2 (two) times daily. 10 mL 11  . hydrOXYzine (ATARAX/VISTARIL) 50 MG tablet Take 1 tablet (50 mg total) by mouth 3 (three) times daily as needed for anxiety. 30 tablet 0  . insulin detemir (LEVEMIR) 100 UNIT/ML injection Inject 100 Units into the skin at bedtime.    Marland Kitchen. lisinopril (PRINIVIL,ZESTRIL) 20 MG tablet Take 1 tablet (20 mg total) daily by mouth. 30 tablet 0  . omeprazole (PRILOSEC) 20 MG capsule Take 1 capsule (20 mg total) daily by mouth. 30 capsule 0  . prazosin (MINIPRESS) 1 MG capsule Take 1 capsule (1 mg total) by mouth at bedtime. To prevent nightmares 30 capsule 0  . traZODone (DESYREL) 50 MG tablet Take 1 tablet (50 mg total) by mouth at bedtime as needed for sleep. (Patient taking differently:  Take 50 mg at bedtime by mouth. ) 30 tablet 0  . famotidine (PEPCID) 20 MG tablet Take 1 tablet (20 mg total) by mouth 2 (two) times daily. (Patient not taking: Reported on 09/02/2017) 30 tablet 0  . ibuprofen (ADVIL,MOTRIN) 600 MG tablet Take 1 tablet (600 mg total) by mouth every 6 (six) hours as needed for mild pain or moderate pain. 15 tablet 0  . insulin aspart  (NOVOLOG) 100 UNIT/ML injection Inject 0-20 Units into the skin 3 (three) times daily with meals. USE SLIDING SCALE AT HOME (Patient not taking: Reported on 03/14/2016) 10 mL 11  . insulin glargine (LANTUS) 100 UNIT/ML injection Inject 0.6 mLs (60 Units total) 2 (two) times daily into the skin. 10 mL 0  . lidocaine (LIDODERM) 5 % Place 1 patch onto the skin daily. Remove & Discard patch within 12 hours or as directed by MD Apply the patch to the Right side of the neck. (Patient not taking: Reported on 03/14/2016) 30 patch 0  . lovastatin (MEVACOR) 40 MG tablet Take 1 tablet (40 mg total) by mouth at bedtime. (Patient not taking: Reported on 03/14/2016) 90 tablet 3  . metFORMIN (GLUCOPHAGE) 500 MG tablet Take 2 tablets (1,000 mg total) 2 (two) times daily with a meal by mouth. 60 tablet 0  . metroNIDAZOLE (FLAGYL) 500 MG tablet Take 1 tablet (500 mg total) by mouth 2 (two) times daily. (Patient not taking: Reported on 09/02/2017) 14 tablet 0  . nicotine (NICODERM CQ - DOSED IN MG/24 HOURS) 21 mg/24hr patch Place 1 patch (21 mg total) onto the skin daily at 6 (six) AM. (Patient not taking: Reported on 03/14/2016) 28 patch 0  . PARoxetine (PAXIL) 20 MG tablet Take 1 tablet (20 mg total) by mouth daily. (Patient not taking: Reported on 03/14/2016) 30 tablet 0   No facility-administered medications prior to visit.    Past Medical History:  Diagnosis Date  . Anemia   . Anxiety   . Arthritis   . Chronic abdominal pain   . Chronic back pain   . Chronic headache   . Chronic neck pain   . Depression   . Diabetes mellitus    adult onset dm-maintained on glipizide and metformin, pt states fasting glucose runs 110  . Fibromyalgia   . GERD (gastroesophageal reflux disease)   . Hyperlipidemia   . Hypertension    maintained on lisinopril hct, metoprolol-134/86 at pat visit  . Neuromuscular disorder (HCC)   . Neuropathy   . Obesity   . Substance abuse [facility])    Past Surgical History:  Procedure  Laterality Date  . ABDOMINAL HYSTERECTOMY  10/30/2011   Procedure: HYSTERECTOMY ABDOMINAL;  Surgeon: Bing Plume, MD;  Location: [facility] ORS;  Service: Gynecology;  Laterality: N/A;  . CESAREAN SECTION  x3  . CHOLECYSTECTOMY N/A 06/28/2013   Procedure: LAPAROSCOPIC CHOLECYSTECTOMY;  Surgeon: Shelly Rubenstein, MD;  Location: [facility] OR;  Service: General;  Laterality: N/A;  . SALPINGOOPHORECTOMY  10/30/2011   Procedure: SALPINGO OOPHERECTOMY;  Surgeon: Bing Plume, MD;  Location: [facility];  Service: Gynecology;  Laterality: Right;  . WRIST SURGERY     carpel tunnel and tendonitis   Social History   Socioeconomic History  . Marital status: Married    Spouse name: Not on file  . Number of children: 1  . Years of education: Not on file  . Highest education level: Not on file  Occupational History  . Not on file  Social Needs  .  Financial resource strain: Not on file  . Food insecurity:    Worry: Not on file    Inability: Not on file  . Transportation needs:    Medical: Not on file    Non-medical: Not on file  Tobacco Use  . Smoking status: Current Every Day Smoker    Packs/day: 0.50    Last attempt to quit: 10/24/1997    Years since quitting: 20.2  . Smokeless tobacco: Never Used  . Tobacco comment: Smoking 1 pp week  Substance and Sexual Activity  . Alcohol use: No    Alcohol/week: 0.0 oz  . 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 Sexual activity: Not on file  Lifestyle  . Physical activity:    Days per week: Not on file    Minutes per session: Not on file  . Stress: Not on file  Relationships  . Social connections:    Talks on phone: Not on file    Gets together: Not on file    Attends religious service: Not on file    Active member of club or organization: Not on file    Attends meetings of clubs or organizations: Not on file    Relationship status: Not on file  Other Topics Concern  . Not on file  Social History Narrative  . Not on file    Family History  Problem Relation Age of Onset  . Diabetes Mother   . Cystic fibrosis Father   . Diabetes Maternal Grandmother   . Heart disease Maternal Grandfather        Review of Systems: Pertinent positive and negative review of systems were noted in the above HPI section. All other review of systems were otherwise negative.   Physical Exam: General: Well developed, well nourished, no acute distress Head: Normocephalic and atraumatic Eyes:  sclerae anicteric, EOMI Ears: Normal auditory acuity Mouth: No deformity or lesions Neck: Supple, no masses or thyromegaly Lungs: Clear throughout to auscultation Heart: Regular rate and rhythm; no murmurs, rubs or bruits Abdomen: Soft, mild diffuse tenderness and non distended. No masses, hepatosplenomegaly or hernias noted. Normal Bowel sounds Musculoskeletal: Symmetrical with no gross deformities  Skin: No lesions on visible extremities Pulses:  Normal pulses noted Extremities: No clubbing, cyanosis, edema or deformities noted Neurological: Alert oriented x 4, grossly nonfocal Cervical Nodes:  No significant cervical adenopathy Inguinal Nodes: No significant inguinal adenopathy Psychological:  Alert and cooperative. Normal mood and affect   Assessment and Recommendations:  1. Diarrhea alternating with constipation associated with abdominal pain and bloating.  Suspected IBS.  CBC, CMP, TSH,  tTG, IgA, GI pathogen panel, stool Hemoccutls.  Avoid foods that trigger symptoms. Begin dicyclomine 10 mg 4 times daily taken before meals and at bedtime.  Consider proceeding with colonoscopy and abd/pelvic CT at [facility] in 6 weeks.   2. GERD.  Continue omeprazole 20 mg daily and standard antireflux measures  3. DM, poorly controlled.  Close follow-up with PCP to improve control of diabetes mellitus which may improve gastrointestinal symptoms.  4. Weight loss likely related to poor diabetes control.    5. Prior cholecystectomy, 2014.     cc: Cari Caraway, F-NP

## 2018-01-16 LAB — TISSUE TRANSGLUTAMINASE, IGA: (tTG) Ab, IgA: 1 U/mL

## 2018-02-25 ENCOUNTER — Ambulatory Visit: Payer: Self-pay | Admitting: Gastroenterology

## 2018-04-08 ENCOUNTER — Other Ambulatory Visit: Payer: Self-pay

## 2018-04-08 ENCOUNTER — Encounter (HOSPITAL_COMMUNITY): Payer: Self-pay

## 2018-04-08 ENCOUNTER — Emergency Department (HOSPITAL_COMMUNITY)
Admission: EM | Admit: 2018-04-08 | Discharge: 2018-04-08 | Disposition: A | Discharge status: Home / Self Care | Payer: BLUE CROSS/BLUE SHIELD | Attending: Emergency Medicine | Admitting: Emergency Medicine

## 2018-04-08 DIAGNOSIS — I10 Essential (primary) hypertension: Secondary | ICD-10-CM | POA: Diagnosis not present

## 2018-04-08 DIAGNOSIS — E785 Hyperlipidemia, unspecified: Secondary | ICD-10-CM | POA: Insufficient documentation

## 2018-04-08 DIAGNOSIS — Z79899 Other long term (current) drug therapy: Secondary | ICD-10-CM | POA: Diagnosis not present

## 2018-04-08 DIAGNOSIS — E119 Type 2 diabetes mellitus without complications: Secondary | ICD-10-CM | POA: Diagnosis not present

## 2018-04-08 DIAGNOSIS — F17210 Nicotine dependence, cigarettes, uncomplicated: Secondary | ICD-10-CM | POA: Diagnosis not present

## 2018-04-08 DIAGNOSIS — N390 Urinary tract infection, site not specified: Secondary | ICD-10-CM | POA: Diagnosis not present

## 2018-04-08 DIAGNOSIS — R319 Hematuria, unspecified: Secondary | ICD-10-CM | POA: Insufficient documentation

## 2018-04-08 DIAGNOSIS — Z794 Long term (current) use of insulin: Secondary | ICD-10-CM | POA: Diagnosis not present

## 2018-04-08 DIAGNOSIS — R102 Pelvic and perineal pain: Secondary | ICD-10-CM | POA: Diagnosis present

## 2018-04-08 LAB — COMPREHENSIVE METABOLIC PANEL
ALBUMIN: 4 g/dL (ref 3.5–5.0)
ALT: 15 U/L (ref 14–54)
ANION GAP: 7 (ref 5–15)
AST: 14 U/L — ABNORMAL LOW (ref 15–41)
Alkaline Phosphatase: 77 U/L (ref 38–126)
BILIRUBIN TOTAL: 0.7 mg/dL (ref 0.3–1.2)
BUN: 5 mg/dL — ABNORMAL LOW (ref 6–20)
CO2: 28 mmol/L (ref 22–32)
Calcium: 10.5 mg/dL — ABNORMAL HIGH (ref 8.9–10.3)
Chloride: 105 mmol/L (ref 101–111)
Creatinine, Ser: 0.72 mg/dL (ref 0.44–1.00)
GFR calc non Af Amer: >60 mL/min (ref >60)
GLUCOSE: 194 mg/dL — AB (ref 65–99)
POTASSIUM: 4.3 mmol/L (ref 3.5–5.1)
Sodium: 140 mmol/L (ref 135–145)
TOTAL PROTEIN: 8 g/dL (ref 6.5–8.1)

## 2018-04-08 LAB — URINALYSIS, ROUTINE W REFLEX MICROSCOPIC
Bacteria, UA: NONE SEEN
Bilirubin Urine: NEGATIVE
GLUCOSE, UA: NEGATIVE mg/dL
KETONES UR: NEGATIVE mg/dL
NITRITE: NEGATIVE
PH: 5 (ref 5.0–8.0)
PROTEIN: 100 mg/dL — AB
RBC / HPF: >50 RBC/hpf — ABNORMAL HIGH (ref 0–5)
Specific Gravity, Urine: 1.028 (ref 1.005–1.030)

## 2018-04-08 LAB — CBC WITH DIFFERENTIAL/PLATELET
ABS IMMATURE GRANULOCYTES: 0 10*3/uL (ref 0.0–0.1)
BASOS ABS: 0.1 10*3/uL (ref 0.0–0.1)
BASOS PCT: 1 %
Eosinophils Absolute: 0.1 10*3/uL (ref 0.0–0.7)
Eosinophils Relative: 1 %
HCT: 43.7 % (ref 36.0–46.0)
HEMOGLOBIN: 13.9 g/dL (ref 12.0–15.0)
IMMATURE GRANULOCYTES: 0 %
LYMPHS PCT: 36 %
Lymphs Abs: 3.9 10*3/uL (ref 0.7–4.0)
MCH: 28.1 pg (ref 26.0–34.0)
MCHC: 31.8 g/dL (ref 30.0–36.0)
MCV: 88.5 fL (ref 78.0–100.0)
Monocytes Absolute: 0.6 10*3/uL (ref 0.1–1.0)
Monocytes Relative: 5 %
NEUTROS ABS: 6.2 10*3/uL (ref 1.7–7.7)
NEUTROS PCT: 57 %
PLATELETS: 331 10*3/uL (ref 150–400)
RBC: 4.94 MIL/uL (ref 3.87–5.11)
RDW: 13.2 % (ref 11.5–15.5)
WBC: 10.8 10*3/uL — AB (ref 4.0–10.5)

## 2018-04-08 LAB — WET PREP, GENITAL
Sperm: NONE SEEN
Trich, Wet Prep: NONE SEEN
WBC, Wet Prep HPF POC: NONE SEEN
Yeast Wet Prep HPF POC: NONE SEEN

## 2018-04-08 MED ORDER — OMEPRAZOLE 20 MG PO CPDR: 20.0000 mg | DELAYED_RELEASE_CAPSULE | Freq: Every day | ORAL | 0 refills | Status: DC

## 2018-04-08 MED ORDER — INSULIN PEN NEEDLE 30G X 8 MM MISC: 1.0000 | 1 refills | Status: DC | PRN

## 2018-04-08 MED ORDER — CEPHALEXIN 500 MG PO CAPS: 500.0000 mg | ORAL_CAPSULE | Freq: Four times a day (QID) | ORAL | 0 refills | Status: DC

## 2018-04-08 MED ORDER — LISINOPRIL 20 MG PO TABS: 20.0000 mg | ORAL_TABLET | Freq: Every day | ORAL | 0 refills | Status: DC

## 2018-04-08 MED ORDER — ATORVASTATIN CALCIUM 20 MG PO TABS: 20.0000 mg | ORAL_TABLET | Freq: Every day | ORAL | 0 refills | Status: DC

## 2018-04-08 NOTE — Discharge Instructions (Signed)
Please read attached information. If you experience any new or worsening signs or symptoms please return to the emergency room for evaluation. Please follow-up with your primary care provider or specialist as discussed. Please use medication prescribed only as directed and discontinue taking if you have any concerning signs or symptoms.   °

## 2018-04-08 NOTE — ED Notes (Signed)
ED Provider at bedside. 

## 2018-04-08 NOTE — ED Triage Notes (Signed)
Pt endorses vaginal pain and hematuria with urinary frequency that began this morning. Endorses unprotected sex recently. Also has DM and is on insulin and metformin but is out of her levimir needles and is out of her BP meds as well. States "feels like i'm spilling sugar in my urine"

## 2018-04-08 NOTE — ED Provider Notes (Signed)
[facility] EMERGENCY DEPARTMENT Provider Note   CSN: [id number] Arrival date & time: 04/08/18  1556   History   Chief Complaint Chief Complaint  Patient presents with  . Hematuria  . Vaginal Pain    HPI Vanessa Case is a 47 y.o. female.   HPI   47 year old female presents today with complaints of suprapubic pain and hematuria.  Patient notes she woke up this morning with blood in her urine, suprapubic pain.  She notes the pain is only present when she urinates.  Patient denies any vaginal playing, vaginal discharge, nausea vomiting fever, or upper abdominal pain.  Patient notes she is a diabetic hypertensive with hyperlipidemia.  She has run out of her medications as she is switching primary care providers.  She notes that she takes lisinopril 20 mg, atorvastatin 20 mg, omeprazole 20 mg.  She also notes she has insulin at home but has run out of her needles.  Patient reports she is sexually active with female partners only.   Past Medical History:  Diagnosis Date  . Anemia   . Anxiety   . Arthritis   . Chronic abdominal pain   . Chronic back pain   . Chronic headache   . Chronic neck pain   . Depression   . Diabetes mellitus    adult onset dm-maintained on glipizide and metformin, pt states fasting glucose runs 110  . Fibromyalgia   . GERD (gastroesophageal reflux disease)   . Hyperlipidemia   . Hypertension    maintained on lisinopril hct, metoprolol-134/86 at pat visit  . Neuromuscular disorder (HCC)   . Neuropathy   . Obesity   . Substance abuse [facility])     Patient Active Problem List   Diagnosis Date Noted  . Suicidal ideation 07/20/2015  . Major depressive disorder, recurrent, severe without psychotic features (HCC)   . Type 2 diabetes mellitus with diabetic polyneuropathy (HCC) 05/30/2015  . Other fatigue 05/30/2015  . Essential hypertension 05/30/2015  . HLD (hyperlipidemia) 05/30/2015  . Bipolar 2 disorder, major depressive  episode (HCC) 09/20/2014  . Cholelithiasis with acute cholecystitis 06/28/2013  . Right arm weakness 03/05/2013    Past Surgical History:  Procedure Laterality Date  . ABDOMINAL HYSTERECTOMY  10/30/2011   Procedure: HYSTERECTOMY ABDOMINAL;  Surgeon: Bing Plume, MD;  Location: [facility];  Service: Gynecology;  Laterality: N/A;  . CESAREAN SECTION  x3  . CHOLECYSTECTOMY N/A 06/28/2013   Procedure: LAPAROSCOPIC CHOLECYSTECTOMY;  Surgeon: Shelly Rubenstein, MD;  Location: [facility] OR;  Service: General;  Laterality: N/A;  . SALPINGOOPHORECTOMY  10/30/2011   Procedure: SALPINGO OOPHERECTOMY;  Surgeon: Bing Plume, MD;  Location: [facility];  Service: Gynecology;  Laterality: Right;  . WRIST SURGERY     carpel tunnel and tendonitis     OB History    Gravida  3   Para  3   Term  3   Preterm      AB      Living  3     SAB      TAB      Ectopic      Multiple      Live Births               Home Medications    Prior to Admission medications   Medication Sig Start Date End Date Taking? Authorizing Provider  atorvastatin (LIPITOR) 20 MG tablet Take 1 tablet (20 mg total) by mouth daily. 04/08/18   Eyvonne Mechanic,  PA-C  cephALEXin (KEFLEX) 500 MG capsule Take 1 capsule (500 mg total) by mouth 4 (four) times daily. 04/08/18   Raysa Bosak, Tinnie GensJeffrey, PA-C  cyclobenzaprine (FLEXERIL) 10 MG tablet Take 1 tablet (10 mg total) by mouth 3 (three) times daily as needed for muscle spasms (and pain). 07/18/16   Trixie DredgeWest, Emily, PA-C  dicyclomine (BENTYL) 10 MG capsule Take 1 capsule (10 mg total) by mouth 4 (four) times daily -  before meals and at bedtime. 01/15/18   Meryl DareStark, Malcolm T, MD  ergocalciferol (VITAMIN D2) 50000 units capsule Take 1 capsule (50,000 Units total) once a week by mouth. 09/02/17   Lawyer, Cristal Deerhristopher, PA-C  FLUoxetine (PROZAC) 20 MG capsule Take 20 mg by mouth daily.    [provider]  fluticasone (FLOVENT HFA) 44 MCG/ACT inhaler Inhale 2 puffs into the lungs 2 (two)  times daily as needed (for shortness of breath). 03/14/16   Benjiman CorePickering, Nathan, MD  gabapentin (NEURONTIN) 400 MG capsule Take 1 capsule (400 mg total) by mouth 3 (three) times daily. 03/14/16   Benjiman CorePickering, Nathan, MD  HUMALOG MIX 75/25 (75-25) 100 UNIT/ML SUSP injection Inject 60 Units into the skin 2 (two) times daily. 07/25/15   Withrow, Everardo AllJohn C, FNP  hydrOXYzine (ATARAX/VISTARIL) 50 MG tablet Take 1 tablet (50 mg total) by mouth 3 (three) times daily as needed for anxiety. 07/25/15   Withrow, Everardo AllJohn C, FNP  insulin detemir (LEVEMIR) 100 UNIT/ML injection Inject 100 Units into the skin at bedtime.    [provider]  Insulin Pen Needle (NOVOFINE) 30G X 8 MM MISC Inject 10 each into the skin as needed. 04/08/18   Jeneane Pieczynski, Tinnie GensJeffrey, PA-C  lisinopril (PRINIVIL,ZESTRIL) 20 MG tablet Take 1 tablet (20 mg total) by mouth daily. 04/08/18   Detria Cummings, Tinnie GensJeffrey, PA-C  omeprazole (PRILOSEC) 20 MG capsule Take 1 capsule (20 mg total) by mouth daily. 04/08/18   Leean Amezcua, Tinnie GensJeffrey, PA-C  prazosin (MINIPRESS) 1 MG capsule Take 1 capsule (1 mg total) by mouth at bedtime. To prevent nightmares 07/25/15   Withrow, Everardo AllJohn C, FNP  traZODone (DESYREL) 50 MG tablet Take 1 tablet (50 mg total) by mouth at bedtime as needed for sleep. Patient taking differently: Take 50 mg at bedtime by mouth.  07/25/15   Withrow, Everardo AllJohn C, FNP   Family History Family History  Problem Relation Age of Onset  . Diabetes Mother   . Cystic fibrosis Father   . Diabetes Maternal Grandmother   . Heart disease Maternal Grandfather     Social History Social History   Tobacco Use  . Smoking status: Current Every Day Smoker    Packs/day: 0.30    Last attempt to quit: 10/24/1997    Years since quitting: 20.4  . Smokeless tobacco: Never Used  . Tobacco comment: Smoking 1 pp week  Substance Use Topics  . Alcohol use: Yes    Alcohol/week: 0.0 oz    Comment: 3 times per week  . Drug use: Yes    Types: Cocaine, Marijuana    Comment: cocaine and weed      Allergies   Percocet [oxycodone-acetaminophen]; Ambien [zolpidem]; and Hydrocodone   Review of Systems Review of Systems  All other systems reviewed and are negative.  Physical Exam Updated Vital Signs BP (!) 148/103 (BP Location: Right Arm)   Pulse 95   Temp 98.3 F (36.8 C) (Oral)   Resp 18   Ht 5\' 5"  (1.651 m)   Wt 79.4 kg (175 lb)   LMP 10/07/2011   SpO2  100%   BMI 29.12 kg/m   Physical Exam  Constitutional: She is oriented to person, place, and time. She appears well-developed and well-nourished.  HENT:  Head: Normocephalic and atraumatic.  Eyes: Pupils are equal, round, and reactive to light. Conjunctivae are normal. Right eye exhibits no discharge. Left eye exhibits no discharge. No scleral icterus.  Neck: Normal range of motion. No JVD present. No tracheal deviation present.  Pulmonary/Chest: Effort normal. No stridor.  Abdominal: Soft. She exhibits no distension and no mass. There is no tenderness. There is no rebound and no guarding. No hernia.  Genitourinary:  Genitourinary Comments: Small amount of white vaginal discharge noted in vaginal vault, no cervical motion tenderness adnexal tenderness or masses  Neurological: She is alert and oriented to person, place, and time. Coordination normal.  Psychiatric: She has a normal mood and affect. Her behavior is normal. Judgment and thought content normal.  Nursing note and vitals reviewed.    ED Treatments / Results  Labs (all labs ordered are listed, but only abnormal results are displayed) Labs Reviewed  WET PREP, GENITAL - Abnormal; Notable for the following components:      Result Value   Clue Cells Wet Prep HPF POC PRESENT (*)    All other components within normal limits  COMPREHENSIVE METABOLIC PANEL - Abnormal; Notable for the following components:   Glucose, Bld 194 (*)    BUN 5 (*)    Calcium 10.5 (*)    AST 14 (*)    All other components within normal limits  CBC WITH DIFFERENTIAL/PLATELET -  Abnormal; Notable for the following components:   WBC 10.8 (*)    All other components within normal limits  URINALYSIS, ROUTINE W REFLEX MICROSCOPIC - Abnormal; Notable for the following components:   APPearance TURBID (*)    Hgb urine dipstick LARGE (*)    Protein, ur 100 (*)    Leukocytes, UA MODERATE (*)    RBC / HPF >50 (*)    All other components within normal limits  GC/CHLAMYDIA PROBE AMP ([place]) NOT AT [facility]    EKG None  Radiology No results found.  Procedures Procedures (including critical care time)  Medications Ordered in ED Medications - No data to display   Initial Impression / Assessment and Plan / ED Course  I have reviewed the triage vital signs and the nursing notes.  Pertinent labs & imaging results that were available during my care of the patient were reviewed by me and considered in my medical decision making (see chart for details).     Labs: CMP, CBC UA, wet prep, GC  Imaging:  Consults:  Therapeutics:  Discharge Meds: Keflex, lisinopril, atorvastatin , omeprazole,   Assessment/Plan: 47 year old female presents today with complaints of urinary tract infection.  Patient does have moderate leukocytes in her urine with hematuria.  Although she does not have bacteria I do find it appropriate to start her on antibiotics given her clinical presentation.  She is afebrile nontoxic no signs of pyelonephritis or systemic illness.  Blood sugar is mildly elevated at no signs of DKA or hyperosmolar state.  I will give patient a refill of her medications here, she will follow-up as an outpatient with primary care, she is given strict return precautions, she verbalized understanding and agreement to today's plan had no further questions or concerns.    Final Clinical Impressions(s) / ED Diagnoses   Final diagnoses:  Essential hypertension    ED Discharge Orders  Ordered    Insulin Pen Needle (NOVOFINE) 30G X 8 MM MISC  As needed,   Status:   Discontinued     04/08/18 1810    Insulin Pen Needle (NOVOFINE) 30G X 8 MM MISC  As needed     04/08/18 1835    atorvastatin (LIPITOR) 20 MG tablet  Daily     04/08/18 1835    lisinopril (PRINIVIL,ZESTRIL) 20 MG tablet  Daily     04/08/18 1835    omeprazole (PRILOSEC) 20 MG capsule  Daily     04/08/18 1835    cephALEXin (KEFLEX) 500 MG capsule  4 times daily     04/08/18 1839       Rosalio Loud 04/08/18 1926    Gerhard Munch, MD 04/08/18 2020

## 2018-04-09 LAB — GC/CHLAMYDIA PROBE AMP (~~LOC~~) NOT AT ARMC
Chlamydia: NEGATIVE
Neisseria Gonorrhea: NEGATIVE

## 2018-05-16 ENCOUNTER — Encounter (HOSPITAL_COMMUNITY): Payer: Self-pay | Admitting: Emergency Medicine

## 2018-05-16 ENCOUNTER — Emergency Department (HOSPITAL_COMMUNITY): Payer: BLUE CROSS/BLUE SHIELD

## 2018-05-16 ENCOUNTER — Emergency Department (HOSPITAL_COMMUNITY)
Admission: EM | Admit: 2018-05-16 | Discharge: 2018-05-16 | Disposition: A | Discharge status: Home / Self Care | Payer: BLUE CROSS/BLUE SHIELD | Attending: Emergency Medicine | Admitting: Emergency Medicine

## 2018-05-16 DIAGNOSIS — F17200 Nicotine dependence, unspecified, uncomplicated: Secondary | ICD-10-CM | POA: Diagnosis not present

## 2018-05-16 DIAGNOSIS — Z79899 Other long term (current) drug therapy: Secondary | ICD-10-CM | POA: Insufficient documentation

## 2018-05-16 DIAGNOSIS — B373 Candidiasis of vulva and vagina: Secondary | ICD-10-CM

## 2018-05-16 DIAGNOSIS — E1142 Type 2 diabetes mellitus with diabetic polyneuropathy: Secondary | ICD-10-CM | POA: Insufficient documentation

## 2018-05-16 DIAGNOSIS — B3731 Acute candidiasis of vulva and vagina: Secondary | ICD-10-CM

## 2018-05-16 DIAGNOSIS — E0865 Diabetes mellitus due to underlying condition with hyperglycemia: Secondary | ICD-10-CM

## 2018-05-16 DIAGNOSIS — E1165 Type 2 diabetes mellitus with hyperglycemia: Secondary | ICD-10-CM | POA: Insufficient documentation

## 2018-05-16 DIAGNOSIS — I10 Essential (primary) hypertension: Secondary | ICD-10-CM | POA: Diagnosis not present

## 2018-05-16 DIAGNOSIS — R1031 Right lower quadrant pain: Secondary | ICD-10-CM | POA: Diagnosis present

## 2018-05-16 DIAGNOSIS — R109 Unspecified abdominal pain: Secondary | ICD-10-CM

## 2018-05-16 DIAGNOSIS — Z794 Long term (current) use of insulin: Secondary | ICD-10-CM

## 2018-05-16 LAB — CBC
HEMATOCRIT: 43.1 % (ref 36.0–46.0)
Hemoglobin: 14 g/dL (ref 12.0–15.0)
MCH: 28.6 pg (ref 26.0–34.0)
MCHC: 32.5 g/dL (ref 30.0–36.0)
MCV: 88 fL (ref 78.0–100.0)
Platelets: 268 10*3/uL (ref 150–400)
RBC: 4.9 MIL/uL (ref 3.87–5.11)
RDW: 12.7 % (ref 11.5–15.5)
WBC: 9.5 10*3/uL (ref 4.0–10.5)

## 2018-05-16 LAB — BASIC METABOLIC PANEL
Anion gap: 9 (ref 5–15)
BUN: 6 mg/dL (ref 6–20)
CO2: 23 mmol/L (ref 22–32)
Calcium: 10.2 mg/dL (ref 8.9–10.3)
Chloride: 101 mmol/L (ref 98–111)
Creatinine, Ser: 0.74 mg/dL (ref 0.44–1.00)
Glucose, Bld: 560 mg/dL (ref 70–99)
POTASSIUM: 4.2 mmol/L (ref 3.5–5.1)
Sodium: 133 mmol/L — ABNORMAL LOW (ref 135–145)

## 2018-05-16 LAB — URINALYSIS, ROUTINE W REFLEX MICROSCOPIC
BACTERIA UA: NONE SEEN
BILIRUBIN URINE: NEGATIVE
Glucose, UA: >=500 mg/dL — AB
Hgb urine dipstick: NEGATIVE
KETONES UR: 5 mg/dL — AB
LEUKOCYTES UA: NEGATIVE
NITRITE: NEGATIVE
PH: 7 (ref 5.0–8.0)
Protein, ur: NEGATIVE mg/dL
Specific Gravity, Urine: 1.033 — ABNORMAL HIGH (ref 1.005–1.030)

## 2018-05-16 LAB — CBG MONITORING, ED
GLUCOSE-CAPILLARY: 472 mg/dL — AB (ref 70–99)
GLUCOSE-CAPILLARY: 472 mg/dL — AB (ref 70–99)
GLUCOSE-CAPILLARY: 176 mg/dL — AB (ref 70–99)
Glucose-Capillary: 294 mg/dL — ABNORMAL HIGH (ref 70–99)

## 2018-05-16 LAB — WET PREP, GENITAL
Clue Cells Wet Prep HPF POC: NONE SEEN
Sperm: NONE SEEN
Trich, Wet Prep: NONE SEEN
WBC WET PREP: NONE SEEN
Yeast Wet Prep HPF POC: NONE SEEN

## 2018-05-16 MED ORDER — FLUCONAZOLE 150 MG PO TABS
150.0000 mg | ORAL_TABLET | Freq: Once | ORAL | Status: AC
  Administered 2018-05-16: 150 mg via ORAL
  Filled 2018-05-16: qty 1

## 2018-05-16 MED ORDER — SODIUM CHLORIDE 0.9 % IV BOLUS
1000.0000 mL | Freq: Once | INTRAVENOUS | Status: AC
  Administered 2018-05-16: 1000 mL via INTRAVENOUS

## 2018-05-16 MED ORDER — INSULIN ASPART 100 UNIT/ML ~~LOC~~ SOLN
SUBCUTANEOUS | Status: AC
  Filled 2018-05-16: qty 1

## 2018-05-16 MED ORDER — INSULIN ASPART 100 UNIT/ML IV SOLN
10.0000 [IU] | Freq: Once | INTRAVENOUS | Status: AC
  Administered 2018-05-16: 10 [IU] via INTRAVENOUS
  Filled 2018-05-16: qty 0.1

## 2018-05-16 MED ORDER — INSULIN ASPART 100 UNIT/ML ~~LOC~~ SOLN
5.0000 [IU] | Freq: Once | SUBCUTANEOUS | Status: AC
  Administered 2018-05-16: 5 [IU] via INTRAVENOUS

## 2018-05-16 MED ORDER — ACETAMINOPHEN 500 MG PO TABS
1000.0000 mg | ORAL_TABLET | Freq: Once | ORAL | Status: AC
  Administered 2018-05-16: 1000 mg via ORAL
  Filled 2018-05-16: qty 2

## 2018-05-16 MED ORDER — INSULIN ASPART 100 UNIT/ML IV SOLN
10.0000 [IU] | Freq: Once | INTRAVENOUS | Status: DC
  Filled 2018-05-16: qty 0.1

## 2018-05-16 NOTE — Discharge Instructions (Addendum)
Continue to monitor your glucose levels closely and take insulin as directed.  Stay well-hydrated with water. Follow up STD testing.  If you were given medicines take as directed.  If you are on coumadin or contraceptives realize their levels and effectiveness is altered by many different medicines.  If you have any reaction (rash, tongues swelling, other) to the medicines stop taking and see a physician.    If your blood pressure was elevated in the ER make sure you follow up for management with a primary doctor or return for chest pain, shortness of breath or stroke symptoms.  Please follow up as directed and return to the ER or see a physician for new or worsening symptoms.  Thank you. Vitals:   05/16/18 0930 05/16/18 1134 05/16/18 1135 05/16/18 1200  BP: (!) 150/98 (!) 154/85  (!) 142/78  Pulse: 88  88 82  Resp:      Temp:      TempSrc:      SpO2: 100% 100%  100%

## 2018-05-16 NOTE — ED Triage Notes (Signed)
Patient from home with complaint of right flank pain for 18 days, fatigue for one week, and hyperglycemia. CBG with EMS 504. CBG 472 on arrival to ED. Patient also complains of vaginal itching. Patient alert, oriented, and in no apparent distress at this time.

## 2018-05-16 NOTE — ED Notes (Signed)
Patient transported to CT 

## 2018-05-16 NOTE — ED Notes (Signed)
CBG obtained (472 mg/dL)

## 2018-05-16 NOTE — ED Provider Notes (Signed)
[facility] EMERGENCY DEPARTMENT Provider Note   CSN: [id number] Arrival date & time: 05/16/18  0900     History   Chief Complaint Chief Complaint  Patient presents with  . Hyperglycemia    HPI Vanessa Case is a 47 y.o. female.  Patient with diabetes compliant with insulin, anxiety, bipolar presents with elevated sugars and vaginal itching.  Patient had a urine infection and treated with antibiotics Keflex 1 week ago and has had worsening symptoms since.  Patient does have mild flank pain radiating around to the groin.  No new sexual partners.  No anterior abdominal pain.  No fevers or chills.     Past Medical History:  Diagnosis Date  . Anemia   . Anxiety   . Arthritis   . Chronic abdominal pain   . Chronic back pain   . Chronic headache   . Chronic neck pain   . Depression   . Diabetes mellitus    adult onset dm-maintained on glipizide and metformin, pt states fasting glucose runs 110  . Fibromyalgia   . GERD (gastroesophageal reflux disease)   . Hyperlipidemia   . Hypertension    maintained on lisinopril hct, metoprolol-134/86 at pat visit  . Neuromuscular disorder (HCC)   . Neuropathy   . Obesity   . Substance abuse [facility])     Patient Active Problem List   Diagnosis Date Noted  . Suicidal ideation 07/20/2015  . Major depressive disorder, recurrent, severe without psychotic features (HCC)   . Type 2 diabetes mellitus with diabetic polyneuropathy (HCC) 05/30/2015  . Other fatigue 05/30/2015  . Essential hypertension 05/30/2015  . HLD (hyperlipidemia) 05/30/2015  . Bipolar 2 disorder, major depressive episode (HCC) 09/20/2014  . Cholelithiasis with acute cholecystitis 06/28/2013  . Right arm weakness 03/05/2013    Past Surgical History:  Procedure Laterality Date  . ABDOMINAL HYSTERECTOMY  10/30/2011   Procedure: HYSTERECTOMY ABDOMINAL;  Surgeon: Bing Plume, MD;  Location: [facility];  Service: Gynecology;  Laterality: N/A;  .  CESAREAN SECTION  x3  . CHOLECYSTECTOMY N/A 06/28/2013   Procedure: LAPAROSCOPIC CHOLECYSTECTOMY;  Surgeon: Shelly Rubenstein, MD;  Location: [facility] OR;  Service: General;  Laterality: N/A;  . SALPINGOOPHORECTOMY  10/30/2011   Procedure: SALPINGO OOPHERECTOMY;  Surgeon: Bing Plume, MD;  Location: [facility];  Service: Gynecology;  Laterality: Right;  . WRIST SURGERY     carpel tunnel and tendonitis     OB History    Gravida  3   Para  3   Term  3   Preterm      AB      Living  3     SAB      TAB      Ectopic      Multiple      Live Births               Home Medications    Prior to Admission medications   Medication Sig Start Date End Date Taking? Authorizing Provider  atorvastatin (LIPITOR) 20 MG tablet Take 1 tablet (20 mg total) by mouth daily. 04/08/18   Hedges, Tinnie Gens, PA-C  cephALEXin (KEFLEX) 500 MG capsule Take 1 capsule (500 mg total) by mouth 4 (four) times daily. 04/08/18   Hedges, Tinnie Gens, PA-C  cyclobenzaprine (FLEXERIL) 10 MG tablet Take 1 tablet (10 mg total) by mouth 3 (three) times daily as needed for muscle spasms (and pain). 07/18/16   Trixie Dredge, PA-C  dicyclomine (BENTYL) 10 MG  capsule Take 1 capsule (10 mg total) by mouth 4 (four) times daily -  before meals and at bedtime. 01/15/18   Meryl DareStark, Malcolm T, MD  ergocalciferol (VITAMIN D2) 50000 units capsule Take 1 capsule (50,000 Units total) once a week by mouth. 09/02/17   Lawyer, Cristal Deerhristopher, PA-C  FLUoxetine (PROZAC) 20 MG capsule Take 20 mg by mouth daily.    [provider]  fluticasone (FLOVENT HFA) 44 MCG/ACT inhaler Inhale 2 puffs into the lungs 2 (two) times daily as needed (for shortness of breath). 03/14/16   Benjiman CorePickering, Nathan, MD  gabapentin (NEURONTIN) 400 MG capsule Take 1 capsule (400 mg total) by mouth 3 (three) times daily. 03/14/16   Benjiman CorePickering, Nathan, MD  HUMALOG MIX 75/25 (75-25) 100 UNIT/ML SUSP injection Inject 60 Units into the skin 2 (two) times daily. 07/25/15   Withrow, Everardo AllJohn  C, FNP  hydrOXYzine (ATARAX/VISTARIL) 50 MG tablet Take 1 tablet (50 mg total) by mouth 3 (three) times daily as needed for anxiety. 07/25/15   Withrow, Everardo AllJohn C, FNP  insulin detemir (LEVEMIR) 100 UNIT/ML injection Inject 100 Units into the skin at bedtime.    [provider]  Insulin Pen Needle (NOVOFINE) 30G X 8 MM MISC Inject 10 each into the skin as needed. 04/08/18   Hedges, Tinnie GensJeffrey, PA-C  lisinopril (PRINIVIL,ZESTRIL) 20 MG tablet Take 1 tablet (20 mg total) by mouth daily. 04/08/18   Hedges, Tinnie GensJeffrey, PA-C  omeprazole (PRILOSEC) 20 MG capsule Take 1 capsule (20 mg total) by mouth daily. 04/08/18   Hedges, Tinnie GensJeffrey, PA-C  prazosin (MINIPRESS) 1 MG capsule Take 1 capsule (1 mg total) by mouth at bedtime. To prevent nightmares 07/25/15   Withrow, Everardo AllJohn C, FNP  traZODone (DESYREL) 50 MG tablet Take 1 tablet (50 mg total) by mouth at bedtime as needed for sleep. Patient taking differently: Take 50 mg at bedtime by mouth.  07/25/15   Withrow, Everardo AllJohn C, FNP    Family History Family History  Problem Relation Age of Onset  . Diabetes Mother   . Cystic fibrosis Father   . Diabetes Maternal Grandmother   . Heart disease Maternal Grandfather     Social History Social History   Tobacco Use  . Smoking status: Current Every Day Smoker    Packs/day: 0.30    Last attempt to quit: 10/24/1997    Years since quitting: 20.5  . Smokeless tobacco: Never Used  . Tobacco comment: Smoking 1 pp week  Substance Use Topics  . Alcohol use: Yes    Alcohol/week: 0.0 oz    Comment: 3 times per week  . Drug use: Yes    Types: Cocaine, Marijuana    Comment: cocaine and weed     Allergies   Percocet [oxycodone-acetaminophen]; Ambien [zolpidem]; and Hydrocodone   Review of Systems Review of Systems  Constitutional: Positive for appetite change. Negative for chills and fever.  HENT: Negative for congestion.   Eyes: Negative for visual disturbance.  Respiratory: Negative for shortness of breath.     Cardiovascular: Negative for chest pain.  Gastrointestinal: Negative for abdominal pain and vomiting.  Genitourinary: Positive for dysuria, flank pain and vaginal discharge.  Musculoskeletal: Negative for back pain, neck pain and neck stiffness.  Skin: Negative for rash.  Neurological: Negative for light-headedness and headaches.     Physical Exam Updated Vital Signs BP (!) 142/78   Pulse 82   Temp 98 F (36.7 C) (Oral)   Resp 16   LMP 10/07/2011   SpO2 100%   Physical  Exam  Constitutional: She is oriented to person, place, and time. She appears well-developed and well-nourished.  HENT:  Head: Normocephalic and atraumatic.  Eyes: Conjunctivae are normal. Right eye exhibits no discharge. Left eye exhibits no discharge.  Neck: Normal range of motion. Neck supple. No tracheal deviation present.  Cardiovascular: Normal rate and regular rhythm.  Pulmonary/Chest: Effort normal and breath sounds normal.  Abdominal: Soft. She exhibits no distension. There is no tenderness. There is no guarding.  Genitourinary:  Genitourinary Comments: White discharge, no CMT, no external lesions  Musculoskeletal: She exhibits no edema.  Neurological: She is alert and oriented to person, place, and time.  Skin: Skin is warm. No rash noted.  Psychiatric: She has a normal mood and affect.  Nursing note and vitals reviewed.    ED Treatments / Results  Labs (all labs ordered are listed, but only abnormal results are displayed) Labs Reviewed  BASIC METABOLIC PANEL - Abnormal; Notable for the following components:      Result Value   Sodium 133 (*)    Glucose, Bld 560 (*)    All other components within normal limits  URINALYSIS, ROUTINE W REFLEX MICROSCOPIC - Abnormal; Notable for the following components:   Color, Urine STRAW (*)    Specific Gravity, Urine 1.033 (*)    Glucose, UA >=500 (*)    Ketones, ur 5 (*)    All other components within normal limits  CBG MONITORING, ED - Abnormal;  Notable for the following components:   Glucose-Capillary 472 (*)    All other components within normal limits  CBG MONITORING, ED - Abnormal; Notable for the following components:   Glucose-Capillary 472 (*)    All other components within normal limits  CBG MONITORING, ED - Abnormal; Notable for the following components:   Glucose-Capillary 294 (*)    All other components within normal limits  WET PREP, GENITAL  CBC  GC/CHLAMYDIA PROBE AMP ([place]) NOT AT [facility]    EKG None  Radiology Ct Renal Stone Study  Result Date: 05/16/2018 CLINICAL DATA:  47 year old female with a history of right flank pain EXAM: CT ABDOMEN AND PELVIS WITHOUT CONTRAST TECHNIQUE: Multidetector CT imaging of the abdomen and pelvis was performed following the standard protocol without IV contrast. COMPARISON:  03/20/2015 FINDINGS: Lower chest: No acute abnormality. Hepatobiliary: Cranial caudal span of the right liver measures greater than 20 cm. Cholecystectomy. Pancreas: Unremarkable appearance of the pancreas Spleen: Unremarkable spleen Adrenals/Urinary Tract: Unremarkable adrenal glands. Unremarkable kidneys with no hydronephrosis or nephrolithiasis. Unremarkable course of the bilateral ureters. Urinary bladder relatively decompressed. Stomach/Bowel: Unremarkable stomach. Unremarkable small bowel. No abnormal distention. No transition point. No focal inflammatory changes. Normal appendix. Colon with mild stool burden. Vascular/Lymphatic: Minimal atherosclerotic changes. No abdominal or retroperitoneal adenopathy. Reproductive: Hysterectomy Other: No hernia. Musculoskeletal: Negative for acute displaced fracture. IMPRESSION: Negative for acute CT finding. Hepatomegaly. Cholecystectomy and hysterectomy. Diverticular disease without evidence of acute diverticulitis. Electronically Signed   By: Gilmer Mor D.O.   On: 05/16/2018 10:35    Procedures Procedures (including critical care time)  Medications Ordered in  ED Medications  insulin aspart (novoLOG) injection 10 Units (has no administration in time range)  acetaminophen (TYLENOL) tablet 1,000 mg (1,000 mg Oral Given 05/16/18 1000)  fluconazole (DIFLUCAN) tablet 150 mg (150 mg Oral Given 05/16/18 1134)  sodium chloride 0.9 % bolus 1,000 mL (0 mLs Intravenous Stopped 05/16/18 1101)  insulin aspart (novoLOG) injection 10 Units (10 Units Intravenous Given 05/16/18 1132)  insulin aspart (novoLOG) injection  5 Units (5 Units Intravenous Given 05/16/18 1228)     Initial Impression / Assessment and Plan / ED Course  I have reviewed the triage vital signs and the nursing notes.  Pertinent labs & imaging results that were available during my care of the patient were reviewed by me and considered in my medical decision making (see chart for details).    Patient presents with uncontrolled hyperglycemia/diabetes, IV fluids given and 10 units IV insulin.  Patient improved and sugars improved.  Anion gap normal no signs of DKA.  No signs of severe infection patient clinically does have yeast likely from recent antibiotics.  Fluconazole ordered.  Patient improved on reassessment.  CT scan for right flank pain no acute findings.  Patient will follow-up for STD testing results.  Results and differential diagnosis were discussed with the patient/parent/guardian. Xrays were independently reviewed by myself.  Close follow up outpatient was discussed, comfortable with the plan.   Medications  insulin aspart (novoLOG) injection 10 Units (has no administration in time range)  acetaminophen (TYLENOL) tablet 1,000 mg (1,000 mg Oral Given 05/16/18 1000)  fluconazole (DIFLUCAN) tablet 150 mg (150 mg Oral Given 05/16/18 1134)  sodium chloride 0.9 % bolus 1,000 mL (0 mLs Intravenous Stopped 05/16/18 1101)  insulin aspart (novoLOG) injection 10 Units (10 Units Intravenous Given 05/16/18 1132)  insulin aspart (novoLOG) injection 5 Units (5 Units Intravenous Given 05/16/18 1228)     Vitals:   05/16/18 1134 05/16/18 1135 05/16/18 1200 05/16/18 1301  BP: (!) 154/85  (!) 142/78 135/87  Pulse:  88 82 84  Resp:      Temp:      TempSrc:      SpO2: 100%  100% 100%    Final diagnoses:  Vagina, candidiasis  Diabetes mellitus due to underlying condition with hyperglycemia, with long-term current use of insulin (HCC)  Acute right flank pain     Final Clinical Impressions(s) / ED Diagnoses   Final diagnoses:  Vagina, candidiasis  Diabetes mellitus due to underlying condition with hyperglycemia, with long-term current use of insulin (HCC)  Acute right flank pain    ED Discharge Orders    None       Blane Ohara, MD 05/16/18 1314

## 2018-05-18 LAB — GC/CHLAMYDIA PROBE AMP (~~LOC~~) NOT AT ARMC
CHLAMYDIA, DNA PROBE: NEGATIVE
Neisseria Gonorrhea: NEGATIVE

## 2018-07-08 ENCOUNTER — Ambulatory Visit (HOSPITAL_COMMUNITY)
Admission: EM | Admit: 2018-07-08 | Discharge: 2018-07-08 | Disposition: A | Discharge status: Home / Self Care | Payer: BLUE CROSS/BLUE SHIELD | Attending: Family Medicine | Admitting: Family Medicine

## 2018-07-08 ENCOUNTER — Encounter (HOSPITAL_COMMUNITY): Payer: Self-pay | Admitting: Emergency Medicine

## 2018-07-08 DIAGNOSIS — J209 Acute bronchitis, unspecified: Secondary | ICD-10-CM | POA: Diagnosis not present

## 2018-07-08 DIAGNOSIS — E1165 Type 2 diabetes mellitus with hyperglycemia: Secondary | ICD-10-CM

## 2018-07-08 LAB — GLUCOSE, CAPILLARY: GLUCOSE-CAPILLARY: 235 mg/dL — AB (ref 70–99)

## 2018-07-08 MED ORDER — ALBUTEROL SULFATE HFA 108 (90 BASE) MCG/ACT IN AERS
2.0000 | INHALATION_SPRAY | Freq: Once | RESPIRATORY_TRACT | Status: AC
  Administered 2018-07-08: 2 via RESPIRATORY_TRACT

## 2018-07-08 MED ORDER — METFORMIN HCL 1000 MG PO TABS: 1000.0000 mg | ORAL_TABLET | Freq: Two times a day (BID) | ORAL | 1 refills | Status: DC

## 2018-07-08 MED ORDER — INSULIN DETEMIR 100 UNIT/ML ~~LOC~~ SOLN: 10.0000 [IU] | Freq: Every day | SUBCUTANEOUS | 2 refills | Status: DC

## 2018-07-08 MED ORDER — INSULIN PEN NEEDLE 30G X 8 MM MISC: 1.0000 | 1 refills | Status: DC | PRN

## 2018-07-08 MED ORDER — BENZONATATE 100 MG PO CAPS: 100.0000 mg | ORAL_CAPSULE | Freq: Three times a day (TID) | ORAL | 0 refills | Status: DC

## 2018-07-08 MED ORDER — ALBUTEROL SULFATE HFA 108 (90 BASE) MCG/ACT IN AERS
INHALATION_SPRAY | RESPIRATORY_TRACT | Status: AC
  Filled 2018-07-08: qty 6.7

## 2018-07-08 NOTE — ED Provider Notes (Signed)
[facility]   [id number] 07/08/18 Arrival Time: 1201  CC: Cough and hyperglycemia  SUBJECTIVE:  Vanessa Case is a 47 y.o. female who presents with worsening cough x 3 weeks.  Admits to positive sick exposure.  Describes cough as constant and dry.  Has tried mucinex and flovent with temporary relief.  Symptoms are made worse with laying down.  Reports previous symptoms in the past diagnosed with URI and treated with antibiotic. Complains of chills, fatgiue, sore throat, and wheezing.   Denies fever, sinus pain, rhinorrhea, SOB, chest pain, changes in bladder habits.    Patients hx significant for DM. Dx in 2008.  Average blood glucose 179.  Checked blood sugar this morning and was 539. Ran out of metformin and levimir as well as needles 3 weeks ago.  Does not have a PCP, but recently obtained insurance.  Complains of nausea, lightheadedness, dizziness, numbness/tingling in extremities, polydipsia, and polyuria  Denies chest pain, or abdominal pain.    ROS: As per HPI.  Past Medical History:  Diagnosis Date  . Anemia   . Anxiety   . Arthritis   . Chronic abdominal pain   . Chronic back pain   . Chronic headache   . Chronic neck pain   . Depression   . Diabetes mellitus    adult onset dm-maintained on glipizide and metformin, pt states fasting glucose runs 110  . Fibromyalgia   . GERD (gastroesophageal reflux disease)   . Hyperlipidemia   . Hypertension    maintained on lisinopril hct, metoprolol-134/86 at pat visit  . Neuromuscular disorder (HCC)   . Neuropathy   . Obesity   . Substance abuse [facility])    Past Surgical History:  Procedure Laterality Date  . ABDOMINAL HYSTERECTOMY  10/30/2011   Procedure: HYSTERECTOMY ABDOMINAL;  Surgeon: Bing Plume, MD;  Location: [facility] ORS;  Service: Gynecology;  Laterality: N/A;  . CESAREAN SECTION  x3  . CHOLECYSTECTOMY N/A 06/28/2013   Procedure: LAPAROSCOPIC CHOLECYSTECTOMY;  Surgeon: Shelly Rubenstein, MD;  Location: [facility]  OR;  Service: General;  Laterality: N/A;  . SALPINGOOPHORECTOMY  10/30/2011   Procedure: SALPINGO OOPHERECTOMY;  Surgeon: Bing Plume, MD;  Location: [facility] ORS;  Service: Gynecology;  Laterality: Right;  . WRIST SURGERY     carpel tunnel and tendonitis   Allergies  Allergen Reactions  . Percocet [Oxycodone-Acetaminophen] Hives, Itching, Palpitations and Other (See Comments)    nightmares  . Ambien [Zolpidem] Other (See Comments)    Sleepwalking episodes  . Hydrocodone Nausea And Vomiting and Rash   No current facility-administered medications on file prior to encounter.    Current Outpatient Medications on File Prior to Encounter  Medication Sig Dispense Refill  . QUEtiapine (SEROQUEL) 50 MG tablet Take 50 mg by mouth at bedtime.    Marland Kitchen atorvastatin (LIPITOR) 20 MG tablet Take 1 tablet (20 mg total) by mouth daily. 30 tablet 0  . dicyclomine (BENTYL) 10 MG capsule Take 1 capsule (10 mg total) by mouth 4 (four) times daily -  before meals and at bedtime. 120 capsule 11  . ergocalciferol (VITAMIN D2) 50000 units capsule Take 1 capsule (50,000 Units total) once a week by mouth. 4 capsule 0  . FLUoxetine (PROZAC) 20 MG capsule Take 20 mg by mouth daily.    . fluticasone (FLOVENT HFA) 44 MCG/ACT inhaler Inhale 2 puffs into the lungs 2 (two) times daily as needed (for shortness of breath). 1 Inhaler 0  . gabapentin (NEURONTIN) 400 MG capsule Take  1 capsule (400 mg total) by mouth 3 (three) times daily. 90 capsule 0  . hydrOXYzine (ATARAX/VISTARIL) 50 MG tablet Take 1 tablet (50 mg total) by mouth 3 (three) times daily as needed for anxiety. 30 tablet 0  . lisinopril (PRINIVIL,ZESTRIL) 20 MG tablet Take 1 tablet (20 mg total) by mouth daily. 30 tablet 0  . omeprazole (PRILOSEC) 20 MG capsule Take 1 capsule (20 mg total) by mouth daily. 30 capsule 0  . prazosin (MINIPRESS) 1 MG capsule Take 1 capsule (1 mg total) by mouth at bedtime. To prevent nightmares 30 capsule 0    Social History    Socioeconomic History  . Marital status: Married    Spouse name: Not on file  . Number of children: 1  . Years of education: Not on file  . Highest education level: Not on file  Occupational History  . Not on file  Social Needs  . Financial resource strain: Not on file  . Food insecurity:    Worry: Not on file    Inability: Not on file  . Transportation needs:    Medical: Not on file    Non-medical: Not on file  Tobacco Use  . Smoking status: Current Every Day Smoker    Packs/day: 0.30    Last attempt to quit: 10/24/1997    Years since quitting: 20.7  . Smokeless tobacco: Never Used  . Tobacco comment: Smoking 1 pp week  Substance and Sexual Activity  . Alcohol use: Yes    Alcohol/week: 0.0 standard drinks    Comment: 3 times per week  . Drug use: Yes    Types: Cocaine, Marijuana    Comment: cocaine and weed  . Sexual activity: Not on file  Lifestyle  . Physical activity:    Days per week: Not on file    Minutes per session: Not on file  . Stress: Not on file  Relationships  . Social connections:    Talks on phone: Not on file    Gets together: Not on file    Attends religious service: Not on file    Active member of club or organization: Not on file    Attends meetings of clubs or organizations: Not on file    Relationship status: Not on file  . Intimate partner violence:    Fear of current or ex partner: Not on file    Emotionally abused: Not on file    Physically abused: Not on file    Forced sexual activity: Not on file  Other Topics Concern  . Not on file  Social History Narrative  . Not on file   Family History  Problem Relation Age of Onset  . Diabetes Mother   . Cystic fibrosis Father   . Diabetes Maternal Grandmother   . Heart disease Maternal Grandfather      OBJECTIVE:  Vitals:   07/08/18 1221  BP: (!) 163/91  Pulse: (!) 101  Resp: 18  Temp: 98.1 F (36.7 C)  SpO2: 100%     General appearance: AOx3 in no acute distress; nontoxic  appearance HEENT: PERRL.  EOM grossly intact.  Sinuses nontender; mild clear rhinorrhea; tonsils nonerythematous, uvula midline Neck: supple without LAD Lungs: clear to auscultation bilaterally without adventitious breath sounds Heart: regular rate and rhythm.  Radial pulses 2+ symmetrical bilaterally Abdomen: soft, nondistended; normal active bowel sounds; nontender to light or deep palpation Skin: warm and dry Psychological: alert and cooperative; normal mood and affect  Labs:  Results for orders  placed or performed during the hospital encounter of 07/08/18 (from the past 24 hour(s))  Glucose, capillary     Status: Abnormal   Collection Time: 07/08/18  1:18 PM  Result Value Ref Range   Glucose-Capillary 235 (H) 70 - 99 mg/dL   Comment 1 Notify RN    Comment 2 Document in Chart     ASSESSMENT & PLAN:  1. Acute bronchitis, unspecified organism   2. Type 2 diabetes mellitus with hyperglycemia, unspecified whether long term insulin use (HCC)     Meds ordered this encounter  Medications  . albuterol (PROVENTIL HFA;VENTOLIN HFA) 108 (90 Base) MCG/ACT inhaler 2 puff  . insulin detemir (LEVEMIR) 100 UNIT/ML injection    Sig: Inject 0.1 mLs (10 Units total) into the skin daily.    Dispense:  10 mL    Refill:  2    Order Specific Question:   Supervising Provider    Answer:   Isa Rankin [phone]  . Insulin Pen Needle (NOVOFINE) 30G X 8 MM MISC    Sig: Inject 10 each into the skin as needed.    Dispense:  1 packet    Refill:  1    Order Specific Question:   Supervising Provider    Answer:   Isa Rankin [phone]  . metFORMIN (GLUCOPHAGE) 1000 MG tablet    Sig: Take 1 tablet (1,000 mg total) by mouth 2 (two) times daily with a meal.    Dispense:  60 tablet    Refill:  1    Order Specific Question:   Supervising Provider    Answer:   Isa Rankin [phone]  . benzonatate (TESSALON) 100 MG capsule    Sig: Take 1 capsule (100 mg total) by mouth every 8  (eight) hours.    Dispense:  21 capsule    Refill:  0    Order Specific Question:   Supervising Provider    Answer:   Isa Rankin [[id number]]   Inhaler given Get plenty of rest and push fluids Use OTC medication as needed for symptomatic relief Prescribed tessolone perles as needed for cough  Metformin refilled.  Insulin refilled.   Inuslin pen needles prescribed Take medications as directed.   Please make an appointment with [facility] ASAP for further evaluation and management of diabetes If sugars continue to be elevated, or you experience lightheadedness, dizziness, chest pain, nausea, vomiting, diarrhea, increased thirst, increased urination, etc please return or go to the ER right away  Reviewed expectations re: course of current medical issues. Questions answered. Outlined signs and symptoms indicating need for more acute intervention. Patient verbalized understanding. After Visit Summary given.          Rennis Harding, PA-C 07/08/18 1517

## 2018-07-08 NOTE — Discharge Instructions (Signed)
Inhaler given Get plenty of rest and push fluids Use OTC medication as needed for symptomatic relief Take antibiotic as directed and to completion Prescribed tessolone perles as needed for cough  Metformin refilled.  Insulin refilled.   Inuslin pen needles prescribed Take medications as directed.   [facility] AS[facility] for further evaluation and management of diabetes If sugars continue to be elevated, or you experience lightheadedness, dizziness, chest pain, nausea, vomiting, diarrhea, increased thirst, increased urination, etc please return or go to the ER right away

## 2018-07-08 NOTE — ED Notes (Signed)
CBG results 235 mg/dL reported to GambiaBrittany Wurst PA by B. Bing PlumeHaynes, EMT

## 2018-07-08 NOTE — ED Triage Notes (Addendum)
Pt c/o cough x3 weeks, pt states she hasn't been able to eat, pt states she checked her blood sugar this morning and it was in the 500s. Pt states she does not have a PCP and recently obtained insurance, states she ran out of her metformin and levimir and the needles.

## 2018-09-18 ENCOUNTER — Emergency Department (HOSPITAL_COMMUNITY)
Admission: EM | Admit: 2018-09-18 | Discharge: 2018-09-18 | Disposition: A | Discharge status: Home / Self Care | Payer: BLUE CROSS/BLUE SHIELD | Attending: Emergency Medicine | Admitting: Emergency Medicine

## 2018-09-18 ENCOUNTER — Encounter (HOSPITAL_COMMUNITY): Payer: Self-pay

## 2018-09-18 ENCOUNTER — Other Ambulatory Visit: Payer: Self-pay

## 2018-09-18 DIAGNOSIS — I10 Essential (primary) hypertension: Secondary | ICD-10-CM | POA: Insufficient documentation

## 2018-09-18 DIAGNOSIS — E119 Type 2 diabetes mellitus without complications: Secondary | ICD-10-CM | POA: Insufficient documentation

## 2018-09-18 DIAGNOSIS — K047 Periapical abscess without sinus: Secondary | ICD-10-CM

## 2018-09-18 DIAGNOSIS — K0889 Other specified disorders of teeth and supporting structures: Secondary | ICD-10-CM | POA: Diagnosis present

## 2018-09-18 LAB — CBG MONITORING, ED: Glucose-Capillary: 289 mg/dL — ABNORMAL HIGH (ref 70–99)

## 2018-09-18 MED ORDER — BUTAMBEN-TETRACAINE-BENZOCAINE 2-2-14 % EX AERO
1.0000 | INHALATION_SPRAY | Freq: Once | CUTANEOUS | Status: AC
  Administered 2018-09-18: 1 via TOPICAL
  Filled 2018-09-18: qty 20

## 2018-09-18 MED ORDER — BUPIVACAINE HCL (PF) 0.5 % IJ SOLN: 50.0000 mL | Freq: Once | INTRAMUSCULAR | Status: DC

## 2018-09-18 MED ORDER — BUPIVACAINE-EPINEPHRINE (PF) 0.5% -1:200000 IJ SOLN
1.8000 mL | Freq: Once | INTRAMUSCULAR | Status: AC
  Administered 2018-09-18: 1.8 mL
  Filled 2018-09-18: qty 1.8

## 2018-09-18 MED ORDER — BUPIVACAINE HCL 0.25 % IJ SOLN
10.0000 mL | Freq: Once | INTRAMUSCULAR | Status: DC
  Filled 2018-09-18: qty 10

## 2018-09-18 MED ORDER — CLINDAMYCIN HCL 150 MG PO CAPS: 450.0000 mg | ORAL_CAPSULE | Freq: Three times a day (TID) | ORAL | 0 refills | Status: DC

## 2018-09-18 MED ORDER — IBUPROFEN 800 MG PO TABS: 800.0000 mg | ORAL_TABLET | Freq: Three times a day (TID) | ORAL | 0 refills | Status: DC | PRN

## 2018-09-18 MED ORDER — TRAMADOL HCL 50 MG PO TABS: 50.0000 mg | ORAL_TABLET | Freq: Four times a day (QID) | ORAL | 0 refills | Status: DC | PRN

## 2018-09-18 NOTE — ED Provider Notes (Signed)
[facility]-EMERGENCY DEPT Provider Note   CSN: [id number] Arrival date & time: 09/18/18  1605     History   Chief Complaint Chief Complaint  Patient presents with  . Dental Pain  . Hyperglycemia    HPI Sintia Keshia Weare is a 47 y.o. female.  The history is provided by the patient. No language interpreter was used.  Dental Pain    Hyperglycemia   Kelissa Daviana Haymaker is a 47 y.o. female who presents to the Emergency Department complaining of dental pain. She presents to the emergency department for evaluation of three days of severe left upper dental pain. Pain is described as throbbing and constant nature. Yesterday she noted that she had some swelling to her face. She also notes felt feasting drainage in her mouth. She denies any fevers, chills, sore throat, trouble swallowing, trouble breathing. She is diabetic. She does have a poor appetite. She is compliant with her medications. Past Medical History:  Diagnosis Date  . Anemia   . Anxiety   . Arthritis   . Chronic abdominal pain   . Chronic back pain   . Chronic headache   . Chronic neck pain   . Depression   . Diabetes mellitus    adult onset dm-maintained on glipizide and metformin, pt states fasting glucose runs 110  . Fibromyalgia   . GERD (gastroesophageal reflux disease)   . Hyperlipidemia   . Hypertension    maintained on lisinopril hct, metoprolol-134/86 at pat visit  . Neuromuscular disorder (HCC)   . Neuropathy   . Obesity   . Substance abuse [facility])     Patient Active Problem List   Diagnosis Date Noted  . Suicidal ideation 07/20/2015  . Major depressive disorder, recurrent, severe without psychotic features (HCC)   . Type 2 diabetes mellitus with diabetic polyneuropathy (HCC) 05/30/2015  . Other fatigue 05/30/2015  . Essential hypertension 05/30/2015  . HLD (hyperlipidemia) 05/30/2015  . Bipolar 2 disorder, major depressive episode (HCC) 09/20/2014  . Cholelithiasis  with acute cholecystitis 06/28/2013  . Right arm weakness 03/05/2013    Past Surgical History:  Procedure Laterality Date  . ABDOMINAL HYSTERECTOMY  10/30/2011   Procedure: HYSTERECTOMY ABDOMINAL;  Surgeon: Bing Plume, MD;  Location: [facility] ORS;  Service: Gynecology;  Laterality: N/A;  . CESAREAN SECTION  x3  . CHOLECYSTECTOMY N/A 06/28/2013   Procedure: LAPAROSCOPIC CHOLECYSTECTOMY;  Surgeon: Shelly Rubenstein, MD;  [facility] OR;  Service: General;  Laterality: N/A;  . SALPINGOOPHORECTOMY  10/30/2011   Procedure: SALPINGO OOPHERECTOMY;  Surgeon: Bing Plume, MD;  Location: [facility] ORS;  Service: Gynecology;  Laterality: Right;  . WRIST SURGERY     carpel tunnel and tendonitis     OB History    Gravida  3   Para  3   Term  3   Preterm      AB      Living  3     SAB      TAB      Ectopic      Multiple      Live Births               Home Medications    Prior to Admission medications   Medication Sig Start Date End Date Taking? Authorizing Provider  atorvastatin (LIPITOR) 20 MG tablet Take 1 tablet (20 mg total) by mouth daily. 04/08/18   Hedges, Tinnie Gens, PA-C  benzonatate (TESSALON) 100 MG capsule Take 1 capsule (100 mg total) by  mouth every 8 (eight) hours. 07/08/18   Wurst, GrenadaBrittany, PA-C  clindamycin (CLEOCIN) 150 MG capsule Take 3 capsules (450 mg total) by mouth 3 (three) times daily. 09/18/18   Tilden Fossaees, Tatum Corl, MD  dicyclomine (BENTYL) 10 MG capsule Take 1 capsule (10 mg total) by mouth 4 (four) times daily -  before meals and at bedtime. 01/15/18   Meryl DareStark, Malcolm T, MD  ergocalciferol (VITAMIN D2) 50000 units capsule Take 1 capsule (50,000 Units total) once a week by mouth. 09/02/17   Lawyer, Cristal Deerhristopher, PA-C  FLUoxetine (PROZAC) 20 MG capsule Take 20 mg by mouth daily.    [provider]  fluticasone (FLOVENT HFA) 44 MCG/ACT inhaler Inhale 2 puffs into the lungs 2 (two) times daily as needed (for shortness of breath). 03/14/16   Benjiman CorePickering, Nathan, MD   gabapentin (NEURONTIN) 400 MG capsule Take 1 capsule (400 mg total) by mouth 3 (three) times daily. 03/14/16   Benjiman CorePickering, Nathan, MD  hydrOXYzine (ATARAX/VISTARIL) 50 MG tablet Take 1 tablet (50 mg total) by mouth 3 (three) times daily as needed for anxiety. 07/25/15   Withrow, Everardo AllJohn C, FNP  ibuprofen (ADVIL,MOTRIN) 800 MG tablet Take 1 tablet (800 mg total) by mouth every 8 (eight) hours as needed. 09/18/18   Tilden Fossaees, Durant Scibilia, MD  insulin detemir (LEVEMIR) 100 UNIT/ML injection Inject 0.1 mLs (10 Units total) into the skin daily. 07/08/18   Wurst, GrenadaBrittany, PA-C  Insulin Pen Needle (NOVOFINE) 30G X 8 MM MISC Inject 10 each into the skin as needed. 07/08/18   Wurst, GrenadaBrittany, PA-C  lisinopril (PRINIVIL,ZESTRIL) 20 MG tablet Take 1 tablet (20 mg total) by mouth daily. 04/08/18   Hedges, Tinnie GensJeffrey, PA-C  metFORMIN (GLUCOPHAGE) 1000 MG tablet Take 1 tablet (1,000 mg total) by mouth 2 (two) times daily with a meal. 07/08/18 08/07/18  Wurst, GrenadaBrittany, PA-C  omeprazole (PRILOSEC) 20 MG capsule Take 1 capsule (20 mg total) by mouth daily. 04/08/18   Hedges, Tinnie GensJeffrey, PA-C  prazosin (MINIPRESS) 1 MG capsule Take 1 capsule (1 mg total) by mouth at bedtime. To prevent nightmares 07/25/15   Withrow, Everardo AllJohn C, FNP  QUEtiapine (SEROQUEL) 50 MG tablet Take 50 mg by mouth at bedtime.    [provider]  traMADol (ULTRAM) 50 MG tablet Take 1 tablet (50 mg total) by mouth every 6 (six) hours as needed. 09/18/18   Tilden Fossaees, Esai Stecklein, MD    Family History Family History  Problem Relation Age of Onset  . Diabetes Mother   . Cystic fibrosis Father   . Diabetes Maternal Grandmother   . Heart disease Maternal Grandfather     Social History Social History   Tobacco Use  . Smoking status: Former Smoker    Packs/day: 0.30    Last attempt to quit: 10/24/1997    Years since quitting: 20.9  . Smokeless tobacco: Never Used  . Tobacco comment: Smoking 1 pp week  Substance Use Topics  . Alcohol use: Not Currently     Alcohol/week: 0.0 standard drinks    Comment: 3 times per week  . Drug use: Yes    Types: Cocaine, Marijuana    Comment: last used cocaine 3 days ago and marijuana 3 days ago     Allergies   Percocet [oxycodone-acetaminophen]; Ambien [zolpidem]; and Hydrocodone   Review of Systems Review of Systems  All other systems reviewed and are negative.    Physical Exam Updated Vital Signs BP (S) (!) 199/107 (BP Location: Right Arm) Comment: Pt states she has not taken her BP meds  and also attributes the elevation to pain and recent dental block procedure  Pulse 89   Temp 97.6 F (36.4 C) (Oral)   Resp 14   Ht 5\' 5"  (1.651 m)   Wt 79.4 kg   LMP 10/07/2011   SpO2 99%   BMI 29.12 kg/m   Physical Exam  Constitutional: She is oriented to person, place, and time. She appears well-developed and well-nourished.  HENT:  Head: Normocephalic and atraumatic.  Mouth/Throat:    Poor dentition. Tooth 11 is fractured at the base. There is focal swelling with purulent drainage at the gingival line. There is mild facial swelling over the left maxillary region.  Eyes: Pupils are equal, round, and reactive to light. EOM are normal.  Cardiovascular: Normal rate and regular rhythm.  No murmur heard. Pulmonary/Chest: Effort normal and breath sounds normal. No respiratory distress.  Abdominal: Soft. There is no tenderness. There is no rebound and no guarding.  Musculoskeletal: She exhibits no edema or tenderness.  Neurological: She is alert and oriented to person, place, and time.  Skin: Skin is warm and dry.  Psychiatric: She has a normal mood and affect. Her behavior is normal.  Nursing note and vitals reviewed.    ED Treatments / Results  Labs (all labs ordered are listed, but only abnormal results are displayed) Labs Reviewed  CBG MONITORING, ED - Abnormal; Notable for the following components:      Result Value   Glucose-Capillary 289 (*)    All other components within normal limits      EKG None  Radiology No results found.  Procedures Dental Block Date/Time: 09/18/2018 11:31 PM Performed by: Tilden Fossa, MD Authorized by: Tilden Fossa, MD   Consent:    Consent obtained:  Verbal   Consent given by:  Patient   Risks discussed:  Infection, nerve damage, swelling and unsuccessful block   Alternatives discussed:  No treatment Indications:    Indications: dental abscess and dental pain   Location:    Block type:  Supraperiosteal   Supraperiosteal location:  Upper teeth   Upper teeth location:  11/LU cuspid Procedure details (see MAR for exact dosages):    Topical anesthetic:  Benzocaine spray   Needle gauge:  27 G   Anesthetic injected:  Bupivacaine 0.5% w/o epi   Injection procedure:  Anatomic landmarks identified, introduced needle, incremental injection and anatomic landmarks palpated Post-procedure details:    Outcome:  Pain improved   Patient tolerance of procedure:  Tolerated well, no immediate complications Comments:     Small Periapical abscess at tooth 11 was incised with number 11 blade with a return of small amount of bloody and purulent material. Patient tolerated procedure well.   (including critical care time)  Medications Ordered in ED Medications  butamben-tetracaine-benzocaine (CETACAINE) spray 1 spray (1 spray Topical Given by Other 09/18/18 2214)  bupivacaine-epinephrine (MARCAINE W/ EPI) 0.5% -1:200000 injection 1.8 mL (1.8 mLs Infiltration Given by Other 09/18/18 2215)     Initial Impression / Assessment and Plan / ED Course  I have reviewed the triage vital signs and the nursing notes.  Pertinent labs & imaging results that were available during my care of the patient were reviewed by me and considered in my medical decision making (see chart for details).     Should here for evaluation of facial swelling and dental pain. She is non-toxic appearing on examination. Examination is consistent with dental abscess.  Periapical block was performed with drainage of small abscess. She was started  on antibiotics. Discussed importance of dentistry follow-up as well as return precautions.  Final Clinical Impressions(s) / ED Diagnoses   Final diagnoses:  Dental abscess    ED Discharge Orders         Ordered    clindamycin (CLEOCIN) 150 MG capsule  3 times daily     09/18/18 2208    traMADol (ULTRAM) 50 MG tablet  Every 6 hours PRN     09/18/18 2208    ibuprofen (ADVIL,MOTRIN) 800 MG tablet  Every 8 hours PRN     09/18/18 2208           Tilden Fossa, MD 09/18/18 2333

## 2018-09-18 NOTE — ED Triage Notes (Signed)
Per EMS- Patient c/o dental pain left upper x 3 days.

## 2018-12-04 ENCOUNTER — Other Ambulatory Visit: Payer: Self-pay | Admitting: Physician Assistant

## 2018-12-04 DIAGNOSIS — Z1231 Encounter for screening mammogram for malignant neoplasm of breast: Secondary | ICD-10-CM

## 2019-01-06 ENCOUNTER — Ambulatory Visit: Payer: Self-pay

## 2019-03-22 ENCOUNTER — Other Ambulatory Visit: Payer: Self-pay

## 2019-03-22 ENCOUNTER — Emergency Department (HOSPITAL_COMMUNITY)
Admission: EM | Admit: 2019-03-22 | Discharge: 2019-03-22 | Disposition: A | Discharge status: Home / Self Care | Payer: BLUE CROSS/BLUE SHIELD | Attending: Emergency Medicine | Admitting: Emergency Medicine

## 2019-03-22 ENCOUNTER — Encounter (HOSPITAL_COMMUNITY): Payer: Self-pay | Admitting: *Deleted

## 2019-03-22 DIAGNOSIS — K0889 Other specified disorders of teeth and supporting structures: Secondary | ICD-10-CM | POA: Insufficient documentation

## 2019-03-22 DIAGNOSIS — I10 Essential (primary) hypertension: Secondary | ICD-10-CM | POA: Insufficient documentation

## 2019-03-22 DIAGNOSIS — Z79891 Long term (current) use of opiate analgesic: Secondary | ICD-10-CM | POA: Diagnosis not present

## 2019-03-22 DIAGNOSIS — Z7984 Long term (current) use of oral hypoglycemic drugs: Secondary | ICD-10-CM | POA: Insufficient documentation

## 2019-03-22 DIAGNOSIS — F1490 Cocaine use, unspecified, uncomplicated: Secondary | ICD-10-CM | POA: Diagnosis not present

## 2019-03-22 DIAGNOSIS — F319 Bipolar disorder, unspecified: Secondary | ICD-10-CM | POA: Diagnosis not present

## 2019-03-22 DIAGNOSIS — E119 Type 2 diabetes mellitus without complications: Secondary | ICD-10-CM | POA: Insufficient documentation

## 2019-03-22 DIAGNOSIS — K029 Dental caries, unspecified: Secondary | ICD-10-CM | POA: Diagnosis not present

## 2019-03-22 DIAGNOSIS — F1290 Cannabis use, unspecified, uncomplicated: Secondary | ICD-10-CM | POA: Insufficient documentation

## 2019-03-22 DIAGNOSIS — Z79899 Other long term (current) drug therapy: Secondary | ICD-10-CM | POA: Insufficient documentation

## 2019-03-22 MED ORDER — PENICILLIN V POTASSIUM 500 MG PO TABS: 500.0000 mg | ORAL_TABLET | Freq: Four times a day (QID) | ORAL | 0 refills | Status: AC

## 2019-03-22 MED ORDER — HYDROCODONE-ACETAMINOPHEN 5-325 MG PO TABS
1.0000 | ORAL_TABLET | Freq: Once | ORAL | Status: AC
  Administered 2019-03-22: 1 via ORAL
  Filled 2019-03-22: qty 1

## 2019-03-22 NOTE — ED Notes (Signed)
Pt reports ongoing dental pain and abscesses in the left upper mouth. Pt states this has occurred several times over the last several months.

## 2019-03-22 NOTE — ED Triage Notes (Signed)
Pt reports ongoing left upper dental pain and now has swelling. Airway intact at triage.

## 2019-03-22 NOTE — ED Provider Notes (Signed)
[facility] EMERGENCY DEPARTMENT Provider Note   CSN: [id number] Arrival date & time: 03/22/19  2055    History   Chief Complaint Chief Complaint  Patient presents with  . Dental Pain    HPI Vanessa Case is a 48 y.o. female presenting to the emergency department with complaint of left-sided upper dental pain worsening for a few days.  Patient states she has had intermittent pain in this area, and has recently established dental insurance and has a dental appointment on 04/01/2019.  She presents today with some swelling to her left cheek and dental pain.  No fevers or difficulty swallowing or breathing.  OTC medications taken for symptoms.     The history is provided by the patient.    Past Medical History:  Diagnosis Date  . Anemia   . Anxiety   . Arthritis   . Chronic abdominal pain   . Chronic back pain   . Chronic headache   . Chronic neck pain   . Depression   . Diabetes mellitus    adult onset dm-maintained on glipizide and metformin, pt states fasting glucose runs 110  . Fibromyalgia   . GERD (gastroesophageal reflux disease)   . Hyperlipidemia   . Hypertension    maintained on lisinopril hct, metoprolol-134/86 at pat visit  . Neuromuscular disorder (HCC)   . Neuropathy   . Obesity   . Substance abuse [facility])     Patient Active Problem List   Diagnosis Date Noted  . Suicidal ideation 07/20/2015  . Major depressive disorder, recurrent, severe without psychotic features (HCC)   . Type 2 diabetes mellitus with diabetic polyneuropathy (HCC) 05/30/2015  . Other fatigue 05/30/2015  . Essential hypertension 05/30/2015  . HLD (hyperlipidemia) 05/30/2015  . Bipolar 2 disorder, major depressive episode (HCC) 09/20/2014  . Cholelithiasis with acute cholecystitis 06/28/2013  . Right arm weakness 03/05/2013    Past Surgical History:  Procedure Laterality Date  . ABDOMINAL HYSTERECTOMY  10/30/2011   Procedure: HYSTERECTOMY ABDOMINAL;   Surgeon: Bing Plume, MD;  Location: [facility] ORS;  Service: Gynecology;  Laterality: N/A;  . CESAREAN SECTION  x3  . CHOLECYSTECTOMY N/A 06/28/2013   Procedure: LAPAROSCOPIC CHOLECYSTECTOMY;  Surgeon: Shelly Rubenstein, MD;  Location: [facility] OR;  Service: General;  Laterality: N/A;  . SALPINGOOPHORECTOMY  10/30/2011   Procedure: SALPINGO OOPHERECTOMY;  Surgeon: Bing Plume, MD;  Location: [facility] ORS;  Service: Gynecology;  Laterality: Right;  . WRIST SURGERY     carpel tunnel and tendonitis     OB History    Gravida  3   Para  3   Term  3   Preterm      AB      Living  3     SAB      TAB      Ectopic      Multiple      Live Births               Home Medications    Prior to Admission medications   Medication Sig Start Date End Date Taking? Authorizing Provider  atorvastatin (LIPITOR) 20 MG tablet Take 1 tablet (20 mg total) by mouth daily. 04/08/18   Hedges, Tinnie Gens, PA-C  benzonatate (TESSALON) 100 MG capsule Take 1 capsule (100 mg total) by mouth every 8 (eight) hours. 07/08/18   Wurst, [place], PA-C  dicyclomine (BENTYL) 10 MG capsule Take 1 capsule (10 mg total) by mouth 4 (four) times daily -  before meals and at bedtime. 01/15/18   Meryl DareStark, Malcolm T, MD  ergocalciferol (VITAMIN D2) 50000 units capsule Take 1 capsule (50,000 Units total) once a week by mouth. 09/02/17   Lawyer, Cristal Deerhristopher, PA-C  FLUoxetine (PROZAC) 20 MG capsule Take 20 mg by mouth daily.    [provider]  fluticasone (FLOVENT HFA) 44 MCG/ACT inhaler Inhale 2 puffs into the lungs 2 (two) times daily as needed (for shortness of breath). 03/14/16   Benjiman CorePickering, Nathan, MD  gabapentin (NEURONTIN) 400 MG capsule Take 1 capsule (400 mg total) by mouth 3 (three) times daily. 03/14/16   Benjiman CorePickering, Nathan, MD  hydrOXYzine (ATARAX/VISTARIL) 50 MG tablet Take 1 tablet (50 mg total) by mouth 3 (three) times daily as needed for anxiety. 07/25/15   Withrow, Everardo AllJohn C, FNP  ibuprofen (ADVIL,MOTRIN) 800 MG tablet  Take 1 tablet (800 mg total) by mouth every 8 (eight) hours as needed. 09/18/18   Tilden Fossaees, Elizabeth, MD  insulin detemir (LEVEMIR) 100 UNIT/ML injection Inject 0.1 mLs (10 Units total) into the skin daily. 07/08/18   Wurst, GrenadaBrittany, PA-C  Insulin Pen Needle (NOVOFINE) 30G X 8 MM MISC Inject 10 each into the skin as needed. 07/08/18   Wurst, [place]adaBrittany, PA-C  lisinopril (PRINIVIL,ZESTRIL) 20 MG tablet Take 1 tablet (20 mg total) by mouth daily. 04/08/18   Hedges, Tinnie GensJeffrey, PA-C  metFORMIN (GLUCOPHAGE) 1000 MG tablet Take 1 tablet (1,000 mg total) by mouth 2 (two) times daily with a meal. 07/08/18 08/07/18  Wurst, [place]adaBrittany, PA-C  omeprazole (PRILOSEC) 20 MG capsule Take 1 capsule (20 mg total) by mouth daily. 04/08/18   Hedges, Tinnie GensJeffrey, PA-C  penicillin v potassium (VEETID) 500 MG tablet Take 1 tablet (500 mg total) by mouth 4 (four) times daily for 7 days. 03/22/19 03/29/19  Robinson, [place]azil[place]Jordan N, PA-C  prazosin (MINIPRESS) 1 MG capsule Take 1 capsule (1 mg total) by mouth at bedtime. To prevent nightmares 07/25/15   Withrow, Everardo AllJohn C, FNP  QUEtiapine (SEROQUEL) 50 MG tablet Take 50 mg by mouth at bedtime.    [provider]    Family History Family History  Problem Relation Age of Onset  . Diabetes Mother   . Cystic fibrosis Father   . Diabetes Maternal Grandmother   . Heart disease Maternal Grandfather     Social History Social History   Tobacco Use  . Smoking status: Former Smoker    Packs/day: 0.30    Last attempt to quit: 10/24/1997    Years since quitting: 21.4  . Smokeless tobacco: Never Used  . Tobacco comment: Smoking 1 pp week  Substance Use Topics  . Alcohol use: Not Currently    Alcohol/week: 0.0 standard drinks    Comment: 3 times per week  . Drug use: Yes    Types: Cocaine, Marijuana    Comment: last used cocaine 3 days ago and marijuana 3 days ago     Allergies   Percocet [oxycodone-acetaminophen] and Ambien [zolpidem]   Review of Systems Review of Systems   Constitutional: Negative for fever.  HENT: Positive for dental problem.      Physical Exam Updated Vital Signs BP (!) 105/93 (BP Location: Left Arm)   Pulse (!) 107   Temp 98.4 F (36.9 C) (Oral)   Resp 14   Ht 5\' 5"  (1.651 m)   Wt 78 kg   LMP 10/07/2011   SpO2 99%   BMI 28.62 kg/m   Physical Exam Vitals signs and nursing note reviewed.  Constitutional:  Appearance: She is well-developed.     Comments: Patient appears uncomfortable  HENT:     Head: Normocephalic and atraumatic.     Mouth/Throat:     Comments: There is some mild swelling to the left cheek.  Poor dentition throughout.  There are multiple missing and severely decayed teeth.  Left upper molar with large decay and surrounding gingival erythema mild edema.  No fluctuance.  No sublingual edema or tenderness.  Tolerating secretions. Eyes:     Conjunctiva/sclera: Conjunctivae normal.  Neck:     Musculoskeletal: Normal range of motion and neck supple. No muscular tenderness.  Cardiovascular:     Rate and Rhythm: Regular rhythm. Tachycardia present.  Pulmonary:     Effort: Pulmonary effort is normal.     Breath sounds: No stridor.  Lymphadenopathy:     Cervical: No cervical adenopathy.  Neurological:     Mental Status: She is alert.  Psychiatric:        Mood and Affect: Mood normal.        Behavior: Behavior normal.      ED Treatments / Results  Labs (all labs ordered are listed, but only abnormal results are displayed) Labs Reviewed - No data to display  EKG None  Radiology No results found.  Procedures Procedures (including critical care time)  Medications Ordered in ED Medications  HYDROcodone-acetaminophen (NORCO/VICODIN) 5-325 MG per tablet 1 tablet (1 tablet Oral Given 03/22/19 2210)     Initial Impression / Assessment and Plan / ED Course  I have reviewed the triage vital signs and the nursing notes.  Pertinent labs & imaging results that were available during my care of the  patient were reviewed by me and considered in my medical decision making (see chart for details).        Patient with dental caries.  No gross abscess.  Afebrile, tolerating secretions. Slightly tachycardic however pt appears to be uncomfortable. Doubt accuracy of blood pressure, diff bw systolic and diastolic, unfortunately repeat VS were not obtained prior to d/c. Likely typo vs inaccurate reading. Exam unconcerning for peritonsillar abscess, Ludwig's angina or spread of infection.  Will treat with penicillin and pain medicine.  Urged patient to attend dental appointment. Pt safe for discharge.  Discussed results, findings, treatment and follow up. Patient advised of return precautions. Patient verbalized understanding and agreed with plan.  Final Clinical Impressions(s) / ED Diagnoses   Final diagnoses:  Pain due to dental caries    ED Discharge Orders         Ordered    penicillin v potassium (VEETID) 500 MG tablet  4 times daily     03/22/19 2157           Robinson, [place] N, PA-C 03/22/19 2230    Gerhard Munch, MD 03/24/19 [phone]

## 2019-03-22 NOTE — ED Notes (Signed)
Patient verbalizes understanding of discharge instructions. Opportunity for questioning and answers were provided. Armband removed by staff, pt discharged from ED home via POV.  

## 2019-03-22 NOTE — Discharge Instructions (Signed)
Please read instructions below. Take the antibiotic, Penicillin V, 4 times per day until they are gone. You can take OTC medications as needed for pain. Schedule an appointment with a dentist, using the dental resource guide attached. Return to the ER for difficulty swallowing or breathing, fever, or new or worsening symptoms.

## 2020-02-05 ENCOUNTER — Other Ambulatory Visit: Payer: Self-pay

## 2020-02-05 ENCOUNTER — Inpatient Hospital Stay (HOSPITAL_COMMUNITY)
Admission: EM | Admit: 2020-02-05 | Discharge: 2020-02-08 | DRG: 247 | Disposition: A | Discharge status: Home / Self Care | Payer: BC Managed Care – PPO | Attending: Cardiology | Admitting: Cardiology

## 2020-02-05 ENCOUNTER — Encounter (HOSPITAL_COMMUNITY): Payer: Self-pay

## 2020-02-05 ENCOUNTER — Emergency Department (HOSPITAL_COMMUNITY): Payer: BC Managed Care – PPO

## 2020-02-05 DIAGNOSIS — M797 Fibromyalgia: Secondary | ICD-10-CM | POA: Diagnosis present

## 2020-02-05 DIAGNOSIS — Z79899 Other long term (current) drug therapy: Secondary | ICD-10-CM

## 2020-02-05 DIAGNOSIS — Z8349 Family history of other endocrine, nutritional and metabolic diseases: Secondary | ICD-10-CM

## 2020-02-05 DIAGNOSIS — I160 Hypertensive urgency: Secondary | ICD-10-CM | POA: Diagnosis not present

## 2020-02-05 DIAGNOSIS — F319 Bipolar disorder, unspecified: Secondary | ICD-10-CM | POA: Diagnosis present

## 2020-02-05 DIAGNOSIS — F1490 Cocaine use, unspecified, uncomplicated: Secondary | ICD-10-CM | POA: Diagnosis present

## 2020-02-05 DIAGNOSIS — Z90721 Acquired absence of ovaries, unilateral: Secondary | ICD-10-CM | POA: Diagnosis not present

## 2020-02-05 DIAGNOSIS — G8929 Other chronic pain: Secondary | ICD-10-CM | POA: Diagnosis present

## 2020-02-05 DIAGNOSIS — Z791 Long term (current) use of non-steroidal anti-inflammatories (NSAID): Secondary | ICD-10-CM | POA: Diagnosis not present

## 2020-02-05 DIAGNOSIS — E782 Mixed hyperlipidemia: Secondary | ICD-10-CM | POA: Diagnosis present

## 2020-02-05 DIAGNOSIS — F1410 Cocaine abuse, uncomplicated: Secondary | ICD-10-CM | POA: Diagnosis not present

## 2020-02-05 DIAGNOSIS — F603 Borderline personality disorder: Secondary | ICD-10-CM | POA: Diagnosis present

## 2020-02-05 DIAGNOSIS — I214 Non-ST elevation (NSTEMI) myocardial infarction: Secondary | ICD-10-CM | POA: Diagnosis present

## 2020-02-05 DIAGNOSIS — Z888 Allergy status to other drugs, medicaments and biological substances status: Secondary | ICD-10-CM

## 2020-02-05 DIAGNOSIS — K219 Gastro-esophageal reflux disease without esophagitis: Secondary | ICD-10-CM | POA: Diagnosis present

## 2020-02-05 DIAGNOSIS — Z9049 Acquired absence of other specified parts of digestive tract: Secondary | ICD-10-CM | POA: Diagnosis not present

## 2020-02-05 DIAGNOSIS — Z90710 Acquired absence of both cervix and uterus: Secondary | ICD-10-CM

## 2020-02-05 DIAGNOSIS — Z915 Personal history of self-harm: Secondary | ICD-10-CM | POA: Diagnosis not present

## 2020-02-05 DIAGNOSIS — I700 Atherosclerosis of aorta: Secondary | ICD-10-CM | POA: Diagnosis present

## 2020-02-05 DIAGNOSIS — E1142 Type 2 diabetes mellitus with diabetic polyneuropathy: Secondary | ICD-10-CM | POA: Diagnosis present

## 2020-02-05 DIAGNOSIS — E7800 Pure hypercholesterolemia, unspecified: Secondary | ICD-10-CM | POA: Diagnosis not present

## 2020-02-05 DIAGNOSIS — Z885 Allergy status to narcotic agent status: Secondary | ICD-10-CM

## 2020-02-05 DIAGNOSIS — Z833 Family history of diabetes mellitus: Secondary | ICD-10-CM

## 2020-02-05 DIAGNOSIS — I10 Essential (primary) hypertension: Secondary | ICD-10-CM | POA: Diagnosis present

## 2020-02-05 DIAGNOSIS — F1290 Cannabis use, unspecified, uncomplicated: Secondary | ICD-10-CM | POA: Diagnosis present

## 2020-02-05 DIAGNOSIS — I2510 Atherosclerotic heart disease of native coronary artery without angina pectoris: Secondary | ICD-10-CM | POA: Diagnosis not present

## 2020-02-05 DIAGNOSIS — F1910 Other psychoactive substance abuse, uncomplicated: Secondary | ICD-10-CM

## 2020-02-05 DIAGNOSIS — E119 Type 2 diabetes mellitus without complications: Secondary | ICD-10-CM

## 2020-02-05 DIAGNOSIS — E785 Hyperlipidemia, unspecified: Secondary | ICD-10-CM | POA: Diagnosis present

## 2020-02-05 DIAGNOSIS — Z20822 Contact with and (suspected) exposure to covid-19: Secondary | ICD-10-CM | POA: Diagnosis present

## 2020-02-05 DIAGNOSIS — Z8249 Family history of ischemic heart disease and other diseases of the circulatory system: Secondary | ICD-10-CM

## 2020-02-05 DIAGNOSIS — I358 Other nonrheumatic aortic valve disorders: Secondary | ICD-10-CM | POA: Diagnosis present

## 2020-02-05 DIAGNOSIS — E1140 Type 2 diabetes mellitus with diabetic neuropathy, unspecified: Secondary | ICD-10-CM | POA: Diagnosis present

## 2020-02-05 DIAGNOSIS — Z794 Long term (current) use of insulin: Secondary | ICD-10-CM

## 2020-02-05 DIAGNOSIS — F4310 Post-traumatic stress disorder, unspecified: Secondary | ICD-10-CM | POA: Diagnosis present

## 2020-02-05 DIAGNOSIS — F17200 Nicotine dependence, unspecified, uncomplicated: Secondary | ICD-10-CM | POA: Diagnosis present

## 2020-02-05 HISTORY — DX: Non-ST elevation (NSTEMI) myocardial infarction: I21.4

## 2020-02-05 HISTORY — DX: Post-traumatic stress disorder, unspecified: F43.10

## 2020-02-05 LAB — URINALYSIS, COMPLETE (UACMP) WITH MICROSCOPIC
Bacteria, UA: NONE SEEN
Bilirubin Urine: NEGATIVE
Glucose, UA: 150 mg/dL — AB
Ketones, ur: NEGATIVE mg/dL
Leukocytes,Ua: NEGATIVE
Nitrite: NEGATIVE
Protein, ur: NEGATIVE mg/dL
Specific Gravity, Urine: >1.046 — ABNORMAL HIGH (ref 1.005–1.030)
pH: 6 (ref 5.0–8.0)

## 2020-02-05 LAB — HEPATIC FUNCTION PANEL
ALT: 10 U/L (ref 0–44)
AST: 18 U/L (ref 15–41)
Albumin: 3.8 g/dL (ref 3.5–5.0)
Alkaline Phosphatase: 62 U/L (ref 38–126)
Bilirubin, Direct: <0.1 mg/dL (ref 0.0–0.2)
Total Bilirubin: 0.7 mg/dL (ref 0.3–1.2)
Total Protein: 6.9 g/dL (ref 6.5–8.1)

## 2020-02-05 LAB — RAPID URINE DRUG SCREEN, HOSP PERFORMED
Amphetamines: NOT DETECTED
Barbiturates: NOT DETECTED
Benzodiazepines: NOT DETECTED
Cocaine: POSITIVE — AB
Opiates: NOT DETECTED
Tetrahydrocannabinol: POSITIVE — AB

## 2020-02-05 LAB — BASIC METABOLIC PANEL
Anion gap: 10 (ref 5–15)
BUN: 7 mg/dL (ref 6–20)
CO2: 21 mmol/L — ABNORMAL LOW (ref 22–32)
Calcium: 9.8 mg/dL (ref 8.9–10.3)
Chloride: 106 mmol/L (ref 98–111)
Creatinine, Ser: 0.67 mg/dL (ref 0.44–1.00)
GFR calc Af Amer: >60 mL/min (ref >60)
GFR calc non Af Amer: >60 mL/min (ref >60)
Glucose, Bld: 225 mg/dL — ABNORMAL HIGH (ref 70–99)
Potassium: 3.7 mmol/L (ref 3.5–5.1)
Sodium: 137 mmol/L (ref 135–145)

## 2020-02-05 LAB — HIV ANTIBODY (ROUTINE TESTING W REFLEX): HIV Screen 4th Generation wRfx: NONREACTIVE

## 2020-02-05 LAB — GLUCOSE, CAPILLARY
Glucose-Capillary: 165 mg/dL — ABNORMAL HIGH (ref 70–99)
Glucose-Capillary: 190 mg/dL — ABNORMAL HIGH (ref 70–99)

## 2020-02-05 LAB — CBC
HCT: 37.7 % (ref 36.0–46.0)
Hemoglobin: 12.4 g/dL (ref 12.0–15.0)
MCH: 29.1 pg (ref 26.0–34.0)
MCHC: 32.9 g/dL (ref 30.0–36.0)
MCV: 88.5 fL (ref 80.0–100.0)
Platelets: 236 10*3/uL (ref 150–400)
RBC: 4.26 MIL/uL (ref 3.87–5.11)
RDW: 12.8 % (ref 11.5–15.5)
WBC: 8.1 10*3/uL (ref 4.0–10.5)
nRBC: 0 % (ref 0.0–0.2)

## 2020-02-05 LAB — HEPARIN LEVEL (UNFRACTIONATED)
Heparin Unfractionated: 0.38 IU/mL (ref 0.30–0.70)
Heparin Unfractionated: 0.2 IU/mL — ABNORMAL LOW (ref 0.30–0.70)

## 2020-02-05 LAB — I-STAT BETA HCG BLOOD, ED (MC, WL, AP ONLY): I-stat hCG, quantitative: <5 m[IU]/mL (ref <5)

## 2020-02-05 LAB — HEMOGLOBIN A1C
Hgb A1c MFr Bld: 9.2 % — ABNORMAL HIGH (ref 4.8–5.6)
Mean Plasma Glucose: 217.34 mg/dL

## 2020-02-05 LAB — RESPIRATORY PANEL BY RT PCR (FLU A&B, COVID)
Influenza A by PCR: NEGATIVE
Influenza B by PCR: NEGATIVE
SARS Coronavirus 2 by RT PCR: NEGATIVE

## 2020-02-05 LAB — TSH: TSH: 0.556 u[IU]/mL (ref 0.350–4.500)

## 2020-02-05 LAB — TROPONIN I (HIGH SENSITIVITY)
Troponin I (High Sensitivity): 324 ng/L (ref <18)
Troponin I (High Sensitivity): 541 ng/L (ref <18)

## 2020-02-05 LAB — CBG MONITORING, ED: Glucose-Capillary: 152 mg/dL — ABNORMAL HIGH (ref 70–99)

## 2020-02-05 MED ORDER — TRAZODONE HCL 50 MG PO TABS
50.0000 mg | ORAL_TABLET | Freq: Every day | ORAL | Status: DC
  Administered 2020-02-05 – 2020-02-07 (×3): 50 mg via ORAL
  Filled 2020-02-05 (×3): qty 1

## 2020-02-05 MED ORDER — LIDOCAINE VISCOUS HCL 2 % MT SOLN
15.0000 mL | Freq: Once | OROMUCOSAL | Status: AC
  Administered 2020-02-05: 06:00:00 15 mL via ORAL
  Filled 2020-02-05: qty 15

## 2020-02-05 MED ORDER — TRAMADOL HCL 50 MG PO TABS: 50.0000 mg | ORAL_TABLET | Freq: Two times a day (BID) | ORAL | Status: DC | PRN

## 2020-02-05 MED ORDER — HYDROXYZINE HCL 25 MG PO TABS: 25.0000 mg | ORAL_TABLET | Freq: Every evening | ORAL | Status: DC | PRN

## 2020-02-05 MED ORDER — SODIUM CHLORIDE 0.9% FLUSH
3.0000 mL | Freq: Two times a day (BID) | INTRAVENOUS | Status: DC
  Administered 2020-02-05 – 2020-02-08 (×6): 3 mL via INTRAVENOUS

## 2020-02-05 MED ORDER — ASPIRIN EC 81 MG PO TBEC
81.0000 mg | DELAYED_RELEASE_TABLET | Freq: Every day | ORAL | Status: DC
  Administered 2020-02-06 – 2020-02-08 (×3): 81 mg via ORAL
  Filled 2020-02-05 (×4): qty 1

## 2020-02-05 MED ORDER — DICYCLOMINE HCL 10 MG PO CAPS
10.0000 mg | ORAL_CAPSULE | Freq: Three times a day (TID) | ORAL | Status: DC
  Administered 2020-02-05 – 2020-02-08 (×12): 10 mg via ORAL
  Filled 2020-02-05 (×14): qty 1

## 2020-02-05 MED ORDER — QUETIAPINE FUMARATE 50 MG PO TABS
50.0000 mg | ORAL_TABLET | Freq: Every day | ORAL | Status: DC
  Administered 2020-02-05 – 2020-02-07 (×3): 50 mg via ORAL
  Filled 2020-02-05: qty 2
  Filled 2020-02-05 (×3): qty 1
  Filled 2020-02-05: qty 2
  Filled 2020-02-05: qty 1

## 2020-02-05 MED ORDER — FLUOXETINE HCL 20 MG PO CAPS
40.0000 mg | ORAL_CAPSULE | Freq: Every day | ORAL | Status: DC
  Administered 2020-02-05 – 2020-02-08 (×4): 40 mg via ORAL
  Filled 2020-02-05 (×4): qty 2

## 2020-02-05 MED ORDER — NITROGLYCERIN 2 % TD OINT
1.0000 [in_us] | TOPICAL_OINTMENT | Freq: Once | TRANSDERMAL | Status: AC
  Administered 2020-02-05: 1 [in_us] via TOPICAL
  Filled 2020-02-05: qty 1

## 2020-02-05 MED ORDER — HEPARIN (PORCINE) 25000 UT/250ML-% IV SOLN
1050.0000 [IU]/h | INTRAVENOUS | Status: DC
  Administered 2020-02-05: 09:00:00 850 [IU]/h via INTRAVENOUS
  Administered 2020-02-06: 1000 [IU]/h via INTRAVENOUS
  Administered 2020-02-07: 09:00:00 1050 [IU]/h via INTRAVENOUS
  Filled 2020-02-05 (×3): qty 250

## 2020-02-05 MED ORDER — NITROGLYCERIN 2 % TD OINT
1.0000 [in_us] | TOPICAL_OINTMENT | Freq: Four times a day (QID) | TRANSDERMAL | Status: DC
  Administered 2020-02-05 – 2020-02-06 (×4): 1 [in_us] via TOPICAL
  Filled 2020-02-05: qty 1
  Filled 2020-02-05: qty 30

## 2020-02-05 MED ORDER — PANTOPRAZOLE SODIUM 40 MG PO TBEC
40.0000 mg | DELAYED_RELEASE_TABLET | Freq: Every day | ORAL | Status: DC
  Administered 2020-02-05 – 2020-02-08 (×4): 40 mg via ORAL
  Filled 2020-02-05 (×4): qty 1

## 2020-02-05 MED ORDER — ATORVASTATIN CALCIUM 80 MG PO TABS
80.0000 mg | ORAL_TABLET | Freq: Every day | ORAL | Status: DC
  Administered 2020-02-05 – 2020-02-08 (×4): 80 mg via ORAL
  Filled 2020-02-05 (×4): qty 1

## 2020-02-05 MED ORDER — GABAPENTIN 300 MG PO CAPS
300.0000 mg | ORAL_CAPSULE | Freq: Two times a day (BID) | ORAL | Status: DC
  Administered 2020-02-05 – 2020-02-08 (×7): 300 mg via ORAL
  Filled 2020-02-05: qty 3
  Filled 2020-02-05 (×6): qty 1

## 2020-02-05 MED ORDER — ALUM & MAG HYDROXIDE-SIMETH 200-200-20 MG/5ML PO SUSP
30.0000 mL | Freq: Once | ORAL | Status: AC
  Administered 2020-02-05: 30 mL via ORAL
  Filled 2020-02-05: qty 30

## 2020-02-05 MED ORDER — INSULIN ASPART 100 UNIT/ML ~~LOC~~ SOLN
0.0000 [IU] | Freq: Three times a day (TID) | SUBCUTANEOUS | Status: DC
  Administered 2020-02-05 (×2): 2 [IU] via SUBCUTANEOUS
  Administered 2020-02-06: 17:00:00 3 [IU] via SUBCUTANEOUS
  Administered 2020-02-06: 2 [IU] via SUBCUTANEOUS
  Administered 2020-02-06 – 2020-02-08 (×4): 3 [IU] via SUBCUTANEOUS

## 2020-02-05 MED ORDER — PRAZOSIN HCL 1 MG PO CAPS
1.0000 mg | ORAL_CAPSULE | Freq: Every day | ORAL | Status: DC
  Administered 2020-02-05 – 2020-02-07 (×3): 1 mg via ORAL
  Filled 2020-02-05 (×6): qty 1

## 2020-02-05 MED ORDER — BUDESONIDE 0.25 MG/2ML IN SUSP: 0.2500 mg | Freq: Two times a day (BID) | RESPIRATORY_TRACT | Status: DC | PRN

## 2020-02-05 MED ORDER — SODIUM CHLORIDE 0.9% FLUSH
3.0000 mL | Freq: Once | INTRAVENOUS | Status: AC
  Administered 2020-02-05: 3 mL via INTRAVENOUS

## 2020-02-05 MED ORDER — HEPARIN BOLUS VIA INFUSION
3000.0000 [IU] | Freq: Once | INTRAVENOUS | Status: AC
  Administered 2020-02-05: 09:00:00 3000 [IU] via INTRAVENOUS
  Filled 2020-02-05: qty 3000

## 2020-02-05 MED ORDER — NITROGLYCERIN 0.4 MG SL SUBL
0.4000 mg | SUBLINGUAL_TABLET | SUBLINGUAL | Status: DC | PRN
  Administered 2020-02-06 (×5): 0.4 mg via SUBLINGUAL
  Filled 2020-02-05 (×3): qty 1

## 2020-02-05 MED ORDER — IOHEXOL 350 MG/ML SOLN
100.0000 mL | Freq: Once | INTRAVENOUS | Status: AC | PRN
  Administered 2020-02-05: 07:00:00 100 mL via INTRAVENOUS

## 2020-02-05 MED ORDER — SODIUM CHLORIDE 0.9 % IV SOLN: 250.0000 mL | INTRAVENOUS | Status: DC | PRN

## 2020-02-05 MED ORDER — ONDANSETRON HCL 4 MG/2ML IJ SOLN: 4.0000 mg | Freq: Four times a day (QID) | INTRAMUSCULAR | Status: DC | PRN

## 2020-02-05 MED ORDER — SODIUM CHLORIDE 0.9% FLUSH: 3.0000 mL | INTRAVENOUS | Status: DC | PRN

## 2020-02-05 NOTE — Progress Notes (Signed)
ANTICOAGULATION CONSULT NOTE - Initial Consult  Pharmacy Consult for heparin  Indication: chest pain/ACS  Allergies  Allergen Reactions  . Percocet [Oxycodone-Acetaminophen] Hives, Itching, Palpitations and Other (See Comments)    nightmares  . Ambien [Zolpidem] Other (See Comments)    Sleepwalking episodes    Patient Measurements: Height: 5\' 5"  (165.1 cm) Weight: 72.6 kg (160 lb) IBW/kg (Calculated) : 57 Heparin Dosing Weight: 71.6  Vital Signs: Temp: 98.1 F (36.7 C) (04/17 0340) Temp Source: Oral (04/17 0340) BP: 117/83 (04/17 0745) Pulse Rate: 94 (04/17 0745)  Labs: Recent Labs    02/05/20 0405 02/05/20 0555  HGB 12.4  --   HCT 37.7  --   PLT 236  --   CREATININE 0.67  --   TROPONINIHS 324* 541*    Estimated Creatinine Clearance: 87.7 mL/min (by C-G formula based on SCr of 0.67 mg/dL).   Medical History: Past Medical History:  Diagnosis Date  . Anemia   . Anxiety   . Arthritis   . Chronic abdominal pain   . Chronic back pain   . Chronic headache   . Chronic neck pain   . Depression   . Diabetes mellitus    adult onset dm-maintained on glipizide and metformin, pt states fasting glucose runs 110  . Fibromyalgia   . GERD (gastroesophageal reflux disease)   . Hyperlipidemia   . Hypertension    maintained on lisinopril hct, metoprolol-134/86 at pat visit  . Neuromuscular disorder (HCC)   . Neuropathy   . Obesity   . Substance abuse (HCC)     Medications:  (Not in a hospital admission)   Assessment: 49 year old female presents to the ED with substernal chest pain that was relieved with nitropaste. EKG without acute ischemic changes or evidence of pericarditis. Initial troponin 300 and second trending up to 500. CTA obtained and did not see evidence of dissection or PE. Pharmacy has been consulted for heparin dosing.  Goal of Therapy:  Heparin level 0.3-0.7 units/ml Monitor platelets by anticoagulation protocol: Yes   Plan:  Heparin bolus 3000  units x1 Heparin 850 units/hr Heparin level 1430 x1 Daily heparin level and CBC  49 Vanessa Case 02/05/2020,7:56 AM

## 2020-02-05 NOTE — Progress Notes (Signed)
Pt. received from ED. VSS. Telemetry applied, Chest pain 0 out of 10 Pt. Oriented to room and unit , call light in reach.  Karna Christmas Nghia Mcentee RN

## 2020-02-05 NOTE — Progress Notes (Signed)
ANTICOAGULATION CONSULT NOTE   Pharmacy Consult for heparin  Indication: chest pain/ACS  Allergies  Allergen Reactions  . Percocet [Oxycodone-Acetaminophen] Hives, Itching, Palpitations and Other (See Comments)    nightmares  . Ambien [Zolpidem] Other (See Comments)    Sleepwalking episodes    Patient Measurements: Height: 5\' 5"  (165.1 cm) Weight: 75.5 kg (166 lb 7.2 oz) IBW/kg (Calculated) : 57 Heparin Dosing Weight: 71.6 kg  Vital Signs: Temp: 98.5 F (36.9 C) (04/17 1556) Temp Source: Oral (04/17 1556) BP: 148/91 (04/17 1556) Pulse Rate: 95 (04/17 1600)  Labs: Recent Labs    02/05/20 0405 02/05/20 0555 02/05/20 1348 02/05/20 1931  HGB 12.4  --   --   --   HCT 37.7  --   --   --   PLT 236  --   --   --   HEPARINUNFRC  --   --  0.38 0.20*  CREATININE 0.67  --   --   --   TROPONINIHS 324* 541*  --   --     Estimated Creatinine Clearance: 89.3 mL/min (by C-G formula based on SCr of 0.67 mg/dL).   Medical History: Past Medical History:  Diagnosis Date  . Anemia   . Anxiety   . Arthritis   . Chronic abdominal pain   . Chronic back pain   . Chronic headache   . Chronic neck pain   . Depression   . Diabetes mellitus    adult onset dm-maintained on glipizide and metformin, pt states fasting glucose runs 110  . Fibromyalgia   . GERD (gastroesophageal reflux disease)   . Hyperlipidemia   . Hypertension    maintained on lisinopril hct, metoprolol-134/86 at pat visit  . Neuromuscular disorder (HCC)   . Neuropathy   . Obesity   . PTSD (post-traumatic stress disorder)   . Substance abuse (HCC)     Medications:  Medications Prior to Admission  Medication Sig Dispense Refill Last Dose  . atorvastatin (LIPITOR) 20 MG tablet Take 1 tablet (20 mg total) by mouth daily. 30 tablet 0 02/04/2020 at Unknown time  . dicyclomine (BENTYL) 10 MG capsule Take 1 capsule (10 mg total) by mouth 4 (four) times daily -  before meals and at bedtime. 120 capsule 11 02/04/2020 at  Unknown time  . FLUoxetine (PROZAC) 40 MG capsule Take 40 mg by mouth daily.   02/04/2020 at Unknown time  . fluticasone (FLOVENT HFA) 44 MCG/ACT inhaler Inhale 2 puffs into the lungs 2 (two) times daily as needed (for shortness of breath). 1 Inhaler 0 Past Month at Unknown time  . gabapentin (NEURONTIN) 300 MG capsule Take 300 mg by mouth 2 (two) times daily.   02/04/2020 at Unknown time  . hydrOXYzine (ATARAX/VISTARIL) 25 MG tablet Take 25 mg by mouth at bedtime as needed for itching or anxiety.   Past Week at Unknown time  . Ibuprofen-diphenhydrAMINE Cit 200-38 MG TABS Take 4 tablets by mouth at bedtime as needed (Pain and Sleep).   02/05/2020 at Unknown time  . lisinopril (PRINIVIL,ZESTRIL) 20 MG tablet Take 1 tablet (20 mg total) by mouth daily. 30 tablet 0 02/04/2020 at Unknown time  . meloxicam (MOBIC) 15 MG tablet Take 30 mg by mouth daily as needed for pain.    02/05/2020 at Unknown time  . metFORMIN (GLUCOPHAGE) 500 MG tablet Take 500 mg by mouth 2 (two) times daily.   02/04/2020 at Unknown time  . omeprazole (PRILOSEC) 20 MG capsule Take 1 capsule (20 mg total)  by mouth daily. 30 capsule 0 02/04/2020 at Unknown time  . OZEMPIC, 0.25 OR 0.5 MG/DOSE, 2 MG/1.5ML SOPN Inject 0.5 mg into the skin every Saturday.   Past Week at Unknown time  . prazosin (MINIPRESS) 1 MG capsule Take 1 capsule (1 mg total) by mouth at bedtime. To prevent nightmares 30 capsule 0 02/04/2020 at Unknown time  . QUEtiapine (SEROQUEL) 50 MG tablet Take 50 mg by mouth at bedtime.   02/04/2020 at Unknown time  . traMADol (ULTRAM) 50 MG tablet Take 1 tablet by mouth 2 (two) times daily as needed for pain.   Past Month at Unknown time  . traZODone (DESYREL) 50 MG tablet Take 50 mg by mouth at bedtime.   02/04/2020 at Unknown time  . benzonatate (TESSALON) 100 MG capsule Take 1 capsule (100 mg total) by mouth every 8 (eight) hours. (Patient not taking: Reported on 02/05/2020) 21 capsule 0 Not Taking at Unknown time  . ergocalciferol  (VITAMIN D2) 50000 units capsule Take 1 capsule (50,000 Units total) once a week by mouth. (Patient not taking: Reported on 02/05/2020) 4 capsule 0 Not Taking at Unknown time  . gabapentin (NEURONTIN) 400 MG capsule Take 1 capsule (400 mg total) by mouth 3 (three) times daily. (Patient not taking: Reported on 02/05/2020) 90 capsule 0 Not Taking at Unknown time  . hydrOXYzine (ATARAX/VISTARIL) 50 MG tablet Take 1 tablet (50 mg total) by mouth 3 (three) times daily as needed for anxiety. (Patient not taking: Reported on 02/05/2020) 30 tablet 0 Not Taking at Unknown time  . ibuprofen (ADVIL,MOTRIN) 800 MG tablet Take 1 tablet (800 mg total) by mouth every 8 (eight) hours as needed. (Patient not taking: Reported on 02/05/2020) 21 tablet 0 Not Taking at Unknown time  . insulin detemir (LEVEMIR) 100 UNIT/ML injection Inject 0.1 mLs (10 Units total) into the skin daily. (Patient not taking: Reported on 02/05/2020) 10 mL 2 Not Taking at Unknown time  . Insulin Pen Needle (NOVOFINE) 30G X 8 MM MISC Inject 10 each into the skin as needed. 1 packet 1   . metFORMIN (GLUCOPHAGE) 1000 MG tablet Take 1 tablet (1,000 mg total) by mouth 2 (two) times daily with a meal. (Patient not taking: Reported on 02/05/2020) 60 tablet 1 Not Taking at Unknown time    Assessment: 49 year old female presents to the ED with substernal chest pain that was relieved with nitropaste. EKG without acute ischemic changes or evidence of pericarditis. Initial troponin 300 and second trending up to 541. CTA obtained and did not see evidence of dissection or PE. No anticoagulation PTA. Pharmacy has been consulted for heparin dosing.  Follow up heparin level below goal at 0.2 after being therapeutic on 850 units/hr. No bleeding issues noted.   Goal of Therapy:  Heparin level 0.3-0.7 units/ml Monitor platelets by anticoagulation protocol: Yes   Plan:  Increase heparin to 1000 units/hr 6 hour confirmatory heparin level Daily heparin level and  CBC Monitor for signs of bleeding   Thank you,   Erin Hearing PharmD., BCPS Clinical Pharmacist 02/05/2020 8:56 PM

## 2020-02-05 NOTE — ED Provider Notes (Signed)
[facility] EMERGENCY DEPARTMENT Provider Note  CSN: [id number] Arrival date & time: 02/05/20 7741  Chief Complaint(s) Chest Pain and Nausea  HPI Vanessa Case is a 49 y.o. female with a past medical history listed below who presents to the emergency department for sudden onset substernal chest pain described as deep sharp stabbing pain that radiated to upper back and up bilateral neck to the jaw.  Pain was not exertional.  Denies improved with over-the-counter medicine at home.  It did improve with nitroglycerin by EMS.  Pain began approximately 6 hours prior to arrival.  Went from 10 out of 10 to 5 out of 10 currently.  She denies any associated shortness of breath.  She did report nausea with nonbloody nonbilious emesis.  No abdominal pain.  No recent fevers or infections.  No coughing or congestion.  No history of DVT/PEs.  No other physical complaints.  HPI  Past Medical History Past Medical History:  Diagnosis Date  . Anemia   . Anxiety   . Arthritis   . Chronic abdominal pain   . Chronic back pain   . Chronic headache   . Chronic neck pain   . Depression   . Diabetes mellitus    adult onset dm-maintained on glipizide and metformin, pt states fasting glucose runs 110  . Fibromyalgia   . GERD (gastroesophageal reflux disease)   . Hyperlipidemia   . Hypertension    maintained on lisinopril hct, metoprolol-134/86 at pat visit  . Neuromuscular disorder (HCC)   . Neuropathy   . Obesity   . Substance abuse [facility])    Patient Active Problem List   Diagnosis Date Noted  . Suicidal ideation 07/20/2015  . Major depressive disorder, recurrent, severe without psychotic features (HCC)   . Type 2 diabetes mellitus with diabetic polyneuropathy (HCC) 05/30/2015  . Other fatigue 05/30/2015  . Essential hypertension 05/30/2015  . HLD (hyperlipidemia) 05/30/2015  . Bipolar 2 disorder, major depressive episode (HCC) 09/20/2014  . Cholelithiasis with acute  cholecystitis 06/28/2013  . Right arm weakness 03/05/2013   Home Medication(s) Prior to Admission medications   Medication Sig Start Date End Date Taking? Authorizing Provider  atorvastatin (LIPITOR) 20 MG tablet Take 1 tablet (20 mg total) by mouth daily. 04/08/18  Yes Hedges, Tinnie Gens, PA-C  dicyclomine (BENTYL) 10 MG capsule Take 1 capsule (10 mg total) by mouth 4 (four) times daily -  before meals and at bedtime. 01/15/18  Yes Meryl Dare, MD  FLUoxetine (PROZAC) 40 MG capsule Take 40 mg by mouth daily. 12/22/19  Yes [provider]  fluticasone (FLOVENT HFA) 44 MCG/ACT inhaler Inhale 2 puffs into the lungs 2 (two) times daily as needed (for shortness of breath). 03/14/16  Yes Benjiman Core, MD  gabapentin (NEURONTIN) 300 MG capsule Take 300 mg by mouth 2 (two) times daily. 12/22/19  Yes [provider]  hydrOXYzine (ATARAX/VISTARIL) 25 MG tablet Take 25 mg by mouth at bedtime as needed for itching or anxiety. 12/22/19  Yes [provider]  Ibuprofen-diphenhydrAMINE Cit 200-38 MG TABS Take 4 tablets by mouth at bedtime as needed (Pain and Sleep).   Yes [provider]  lisinopril (PRINIVIL,ZESTRIL) 20 MG tablet Take 1 tablet (20 mg total) by mouth daily. 04/08/18  Yes Hedges, Tinnie Gens, PA-C  meloxicam (MOBIC) 15 MG tablet Take 30 mg by mouth daily as needed for pain.  12/22/19  Yes [provider]  metFORMIN (GLUCOPHAGE) 500 MG tablet Take 500 mg by mouth 2 (two)  times daily. 12/22/19  Yes [provider]  omeprazole (PRILOSEC) 20 MG capsule Take 1 capsule (20 mg total) by mouth daily. 04/08/18  Yes Hedges, Dellis Filbert, PA-C  OZEMPIC, 0.25 OR 0.5 MG/DOSE, 2 MG/1.5ML SOPN Inject 0.5 mg into the skin every Saturday. 01/26/20  Yes [provider]  prazosin (MINIPRESS) 1 MG capsule Take 1 capsule (1 mg total) by mouth at bedtime. To prevent nightmares 07/25/15  Yes Withrow, Elyse Jarvis, FNP  QUEtiapine (SEROQUEL) 50 MG tablet Take 50 mg by mouth at  bedtime.   Yes [provider]  traMADol (ULTRAM) 50 MG tablet Take 1 tablet by mouth 2 (two) times daily as needed for pain. 12/22/19  Yes [provider]  traZODone (DESYREL) 50 MG tablet Take 50 mg by mouth at bedtime. 01/16/20  Yes [provider]  benzonatate (TESSALON) 100 MG capsule Take 1 capsule (100 mg total) by mouth every 8 (eight) hours. Patient not taking: Reported on 02/05/2020 07/08/18   Wurst, [place], PA-C  ergocalciferol (VITAMIN D2) 50000 units capsule Take 1 capsule (50,000 Units total) once a week by mouth. Patient not taking: Reported on 02/05/2020 09/02/17   Dalia Heading, PA-C  gabapentin (NEURONTIN) 400 MG capsule Take 1 capsule (400 mg total) by mouth 3 (three) times daily. Patient not taking: Reported on 02/05/2020 03/14/16   Davonna Belling, MD  hydrOXYzine (ATARAX/VISTARIL) 50 MG tablet Take 1 tablet (50 mg total) by mouth 3 (three) times daily as needed for anxiety. Patient not taking: Reported on 02/05/2020 07/25/15   Withrow, Elyse Jarvis, FNP  ibuprofen (ADVIL,MOTRIN) 800 MG tablet Take 1 tablet (800 mg total) by mouth every 8 (eight) hours as needed. Patient not taking: Reported on 02/05/2020 09/18/18   Quintella Reichert, MD  insulin detemir (LEVEMIR) 100 UNIT/ML injection Inject 0.1 mLs (10 Units total) into the skin daily. Patient not taking: Reported on 02/05/2020 07/08/18   Wurst, [place], PA-C  Insulin Pen Needle (NOVOFINE) 30G X 8 MM MISC Inject 10 each into the skin as needed. 07/08/18   Wurst, [place], PA-C  metFORMIN (GLUCOPHAGE) 1000 MG tablet Take 1 tablet (1,000 mg total) by mouth 2 (two) times daily with a meal. Patient not taking: Reported on 02/05/2020 07/08/18 08/07/18  Lestine Box, PA-C                                                                                                                                    Past Surgical History Past Surgical History:  Procedure Laterality Date  . ABDOMINAL HYSTERECTOMY  10/30/2011    Procedure: HYSTERECTOMY ABDOMINAL;  Surgeon: Melina Schools, MD;  Location: [facility];  Service: Gynecology;  Laterality: N/A;  . CESAREAN SECTION  x3  . CHOLECYSTECTOMY N/A 06/28/2013   Procedure: LAPAROSCOPIC CHOLECYSTECTOMY;  Surgeon: Harl Bowie, MD;  Location: [place];  Service: General;  Laterality: N/A;  . SALPINGOOPHORECTOMY  10/30/2011   Procedure: SALPINGO OOPHERECTOMY;  Surgeon: Lucille Passy  Ambrose Mantle, MD;  Location: [facility];  Service: Gynecology;  Laterality: Right;  . WRIST SURGERY     carpel tunnel and tendonitis   Family History Family History  Problem Relation Age of Onset  . Diabetes Mother   . Cystic fibrosis Father   . Diabetes Maternal Grandmother   . Heart disease Maternal Grandfather     Social History Social History   Tobacco Use  . Smoking status: Former Smoker    Packs/day: 0.30    Quit date: 10/24/1997    Years since quitting: 22.2  . Smokeless tobacco: Never Used  . Tobacco comment: Smoking 1 pp week  Substance Use Topics  . Alcohol use: Not Currently    Alcohol/week: 0.0 standard drinks    Comment: 3 times per week  . Drug use: Yes    Types: Cocaine, Marijuana    Comment: last used cocaine 3 days ago and marijuana 3 days ago   Allergies Percocet [oxycodone-acetaminophen] and Ambien [zolpidem]  Review of Systems Review of Systems All other systems are reviewed and are negative for acute change except as noted in the HPI  Physical Exam Vital Signs  I have reviewed the triage vital signs BP (!) 148/91   Pulse 94   Temp 98.1 F (36.7 C) (Oral)   Resp 14   Ht 5\' 5"  (1.651 m)   Wt 72.6 kg   LMP 10/07/2011   SpO2 100%   BMI 26.63 kg/m   Physical Exam Vitals reviewed.  Constitutional:      General: She is not in acute distress.    Appearance: She is well-developed. She is not diaphoretic.  HENT:     Head: Normocephalic and atraumatic.     Nose: Nose normal.  Eyes:     General: No scleral icterus.       Right eye: No discharge.        Left  eye: No discharge.     Conjunctiva/sclera: Conjunctivae normal.     Pupils: Pupils are equal, round, and reactive to light.  Cardiovascular:     Rate and Rhythm: Normal rate and regular rhythm.     Heart sounds: No murmur. No friction rub. No gallop.   Pulmonary:     Effort: Pulmonary effort is normal. No respiratory distress.     Breath sounds: Normal breath sounds. No stridor. No rales.  Abdominal:     General: There is no distension.     Palpations: Abdomen is soft.     Tenderness: There is no abdominal tenderness.  Musculoskeletal:        General: No tenderness.     Cervical back: Normal range of motion and neck supple.  Skin:    General: Skin is warm and dry.     Findings: No erythema or rash.  Neurological:     Mental Status: She is alert and oriented to person, place, and time.     ED Results and Treatments Labs (all labs ordered are listed, but only abnormal results are displayed) Labs Reviewed  BASIC METABOLIC PANEL - Abnormal; Notable for the following components:      Result Value   CO2 21 (*)    Glucose, Bld 225 (*)    All other components within normal limits  TROPONIN I (HIGH SENSITIVITY) - Abnormal; Notable for the following components:   Troponin I (High Sensitivity) 324 (*)    All other components within normal limits  TROPONIN I (HIGH SENSITIVITY) - Abnormal; Notable for the following components:  Troponin I (High Sensitivity) 541 (*)    All other components within normal limits  RESPIRATORY PANEL BY RT PCR (FLU A&B, COVID)  CBC  RAPID URINE DRUG SCREEN, HOSP PERFORMED  I-STAT BETA HCG BLOOD, ED ([facility], [facility], [facility] ONLY)                                                                                                                         EKG  EKG Interpretation  Date/Time:  Saturday February 05 2020 03:57:19 EDT Ventricular Rate:  91 PR Interval:    QRS Duration: 73 QT Interval:  406 QTC Calculation: 500 R Axis:   62 Text Interpretation: Sinus rhythm  Borderline repolarization abnormality Borderline prolonged QT interval No acute changes Confirmed by Drema Pry [phone]) on 02/05/2020 4:05:47 AM      Radiology DG Chest 2 View  Result Date: 02/05/2020 CLINICAL DATA:  Chest pain.  Nausea and vomiting. EXAM: CHEST - 2 VIEW COMPARISON:  05/14/2016 FINDINGS: Numerous leads and wires project over the chest. Midline trachea.  Normal heart size and mediastinal contours. Sharp costophrenic angles.  No pneumothorax.  Clear lungs. Patient rotated minimally left. IMPRESSION: No active cardiopulmonary disease. Electronically Signed   By: Jeronimo Greaves M.D.   On: 02/05/2020 04:54    Pertinent labs & imaging results that were available during my care of the patient were reviewed by me and considered in my medical decision making (see chart for details).  Medications Ordered in ED Medications  sodium chloride flush (NS) 0.9 % injection 3 mL (has no administration in time range)  alum & mag hydroxide-simeth (MAALOX/MYLANTA) 200-200-20 MG/5ML suspension 30 mL (30 mLs Oral Given 02/05/20 0547)    And  lidocaine (XYLOCAINE) 2 % viscous mouth solution 15 mL (15 mLs Oral Given 02/05/20 0547)  nitroGLYCERIN (NITROGLYN) 2 % ointment 1 inch (1 inch Topical Given 02/05/20 0557)  iohexol (OMNIPAQUE) 350 MG/ML injection 100 mL (100 mLs Intravenous Contrast Given 02/05/20 0634)                                                                                                                                    Procedures .Critical Care Performed by: Nira Conn, MD Authorized by: Nira Conn, MD    CRITICAL CARE Performed by: Amadeo Garnet Breelynn Bankert Total critical care time: 55 minutes Critical care time was exclusive of separately billable procedures and treating other patients.  Critical care was necessary to treat or prevent imminent or life-threatening deterioration. Critical care was time spent personally by me on the following activities:  development of treatment plan with patient and/or surrogate as well as nursing, discussions with consultants, evaluation of patient's response to treatment, examination of patient, obtaining history from patient or surrogate, ordering and performing treatments and interventions, ordering and review of laboratory studies, ordering and review of radiographic studies, pulse oximetry and re-evaluation of patient's condition.   (including critical care time)  Medical Decision Making / ED Course I have reviewed the nursing notes for this encounter and the patient's prior records (if available in EHR or on provided paperwork).   Vanessa Case was evaluated in Emergency Department on 02/05/2020 for the symptoms described in the history of present illness. She was evaluated in the context of the global COVID-19 pandemic, which necessitated consideration that the patient might be at risk for infection with the SARS-CoV-2 virus that causes COVID-19. Institutional protocols and algorithms that pertain to the evaluation of patients at risk for COVID-19 are in a state of rapid change based on information released by regulatory bodies including the CDC and federal and state organizations. These policies and algorithms were followed during the patient's care in the ED.  Patient presents with atypical chest pain.  EKG without acute ischemic changes or evidence of pericarditis.  Given the characteristics of the chest pain, would like to rule out dissection. Screening labs were obtained and initial troponin was elevated to 300.  Second troponin trending up to 500.  Patient given chewable aspirin.  Nitropaste applied and now patient is chest pain-free  CTA obtained and on my read I did not not see evidence of a dissection or PE.  Heparin was started.  Spoke with Dr. Cristal Deerhristopher from cardiology who will admit the patient for further work-up and management.      Final Clinical Impression(s) / ED  Diagnoses Final diagnoses:  NSTEMI (non-ST elevated myocardial infarction) [facility](HCC)      This chart was dictated using voice recognition software.  Despite best efforts to proofread,  errors can occur which can change the documentation meaning.   Nira Connardama, Aaliyana Fredericks Eduardo, MD 02/05/20 [phone]

## 2020-02-05 NOTE — H&P (Addendum)
Cardiology Admission History and Physical:   Patient ID: Vanessa Case MRN: [id number]; DOB: 03-Nov-1970   Admission date: 02/05/2020  Primary Care Provider: [facility] Primary Cardiologist: Jodelle Red, MD (new)  Primary Electrophysiologist:  None   Chief Complaint: chest pain  Patient Profile:   Vanessa Case is a 49 y.o. female with DM, anxiety/depression, episodic cocaine use, borderline personality disorder, anemia, chronic pain, fibromyalgia, PTSD, HTN, HLD, neuropathy who presented to [facility] with chest pain.  History of Present Illness:   Vanessa Case has no prior cardiac history. She shares that she has a history of above diagnoses including prior suicide attempt 6 years ago and maintains close continuity of care with [facility] for her mental health. She has a history of cocaine use and last used 2 days ago. She also smokes marijuana periodically. About a week ago, she recalls having an episode of chest pain out of the blue (similar to today's episode). She took 2 Meloxicam and it went away after an hour. She didn't think anything else of it. However, last night around midnight she was awakened by severe substernal chest pain with associated radiation to her neck, back and arms. She had associated vomiting and SOB. No diaphoresis. She took 2 Meloxicam with no relief. She took Advil PM but pain persisted. She isn't quite sure on what time but eventually family called EMS. She thinks they arrived around 0100 but ED arrival time listed as 0337 so likely longer duration of symptoms  prior to EMS activation. EMS gave her  Zofran,  ASA, SL NTG which greatly improved her pain. When she got to the ED pain recurred so she was given GI cocktail, then a NTG paste which totally relieved her pain. VSS. Her HR is borderline elevated but this looks like her baseline. CXR NAD. Labs show hsTroponins 324->541. UDS and Covid pending. CT  Angio was obtained which showed no dissection or acute abnormality, + aortic atherosclerosis, and ancillary findings as outlined below. EKG shows NSR 91bpm, nonspeific TW changes, no acute ST changes. EKG reads out as borderline QT interval but hand calculated at . She remains pain free at this time.   Past Medical History:  Diagnosis Date   Anemia    Anxiety    Arthritis    Chronic abdominal pain    Chronic back pain    Chronic headache    Chronic neck pain    Depression    Diabetes mellitus    adult onset dm-maintained on glipizide and metformin, pt states fasting glucose runs 110   Fibromyalgia    GERD (gastroesophageal reflux disease)    Hyperlipidemia    Hypertension    maintained on lisinopril hct, metoprolol-134/86 at pat visit   Neuromuscular disorder (HCC)    Neuropathy    Obesity    PTSD (post-traumatic stress disorder)    Substance abuse (HCC)     Past Surgical History:  Procedure Laterality Date   ABDOMINAL HYSTERECTOMY  10/30/2011   Procedure: HYSTERECTOMY ABDOMINAL;  Surgeon: Bing Plume, MD;  Location: [facility];  Service: Gynecology;  Laterality: N/A;   CESAREAN SECTION  x3   CHOLECYSTECTOMY N/A 06/28/2013   Procedure: LAPAROSCOPIC CHOLECYSTECTOMY;  Surgeon: Shelly Rubenstein, MD;  Location: [facility] OR;  Service: General;  Laterality: N/A;   SALPINGOOPHORECTOMY  10/30/2011   Procedure: SALPINGO OOPHERECTOMY;  Surgeon: Bing Plume, MD;  Location: [facility];  Service: Gynecology;  Laterality: Right;   WRIST SURGERY  carpel tunnel and tendonitis     Medications Prior to Admission: Prior to Admission medications   Medication Sig Start Date End Date Taking? Authorizing Provider  atorvastatin (LIPITOR) 20 MG tablet Take 1 tablet (20 mg total) by mouth daily. 04/08/18  Yes Hedges, Tinnie Gens, PA-C  dicyclomine (BENTYL) 10 MG capsule Take 1 capsule (10 mg total) by mouth 4 (four) times daily -  before meals and at bedtime. 01/15/18  Yes Meryl Dare, MD  FLUoxetine (PROZAC) 40 MG capsule Take 40 mg by mouth daily. 12/22/19  Yes [provider]  fluticasone (FLOVENT HFA) 44 MCG/ACT inhaler Inhale 2 puffs into the lungs 2 (two) times daily as needed (for shortness of breath). 03/14/16  Yes Benjiman Core, MD  gabapentin (NEURONTIN) 300 MG capsule Take 300 mg by mouth 2 (two) times daily. 12/22/19  Yes [provider]  hydrOXYzine (ATARAX/VISTARIL) 25 MG tablet Take 25 mg by mouth at bedtime as needed for itching or anxiety. 12/22/19  Yes [provider]  Ibuprofen-diphenhydrAMINE Cit 200-38 MG TABS Take 4 tablets by mouth at bedtime as needed (Pain and Sleep).   Yes [provider]  lisinopril (PRINIVIL,ZESTRIL) 20 MG tablet Take 1 tablet (20 mg total) by mouth daily. 04/08/18  Yes Hedges, Tinnie Gens, PA-C  meloxicam (MOBIC) 15 MG tablet Take 30 mg by mouth daily as needed for pain.  12/22/19  Yes [provider]  metFORMIN (GLUCOPHAGE) 500 MG tablet Take 500 mg by mouth 2 (two) times daily. 12/22/19  Yes [provider]  omeprazole (PRILOSEC) 20 MG capsule Take 1 capsule (20 mg total) by mouth daily. 04/08/18  Yes Hedges, Tinnie Gens, PA-C  OZEMPIC, 0.25 OR 0.5 MG/DOSE, 2 MG/1.5ML SOPN Inject 0.5 mg into the skin every Saturday. 01/26/20  Yes [provider]  prazosin (MINIPRESS) 1 MG capsule Take 1 capsule (1 mg total) by mouth at bedtime. To prevent nightmares 07/25/15  Yes Withrow, Everardo All, FNP  QUEtiapine (SEROQUEL) 50 MG tablet Take 50 mg by mouth at bedtime.   Yes [provider]  traMADol (ULTRAM) 50 MG tablet Take 1 tablet by mouth 2 (two) times daily as needed for pain. 12/22/19  Yes [provider]  traZODone (DESYREL) 50 MG tablet Take 50 mg by mouth at bedtime. 01/16/20  Yes [provider]  benzonatate (TESSALON) 100 MG capsule Take 1 capsule (100 mg total) by mouth every 8 (eight) hours. Patient not taking: Reported on 02/05/2020 07/08/18   Wurst, [place],  PA-C  ergocalciferol (VITAMIN D2) 50000 units capsule Take 1 capsule (50,000 Units total) once a week by mouth. Patient not taking: Reported on 02/05/2020 09/02/17   Charlestine Night, PA-C  gabapentin (NEURONTIN) 400 MG capsule Take 1 capsule (400 mg total) by mouth 3 (three) times daily. Patient not taking: Reported on 02/05/2020 03/14/16   Benjiman Core, MD  hydrOXYzine (ATARAX/VISTARIL) 50 MG tablet Take 1 tablet (50 mg total) by mouth 3 (three) times daily as needed for anxiety. Patient not taking: Reported on 02/05/2020 07/25/15   Withrow, Everardo All, FNP  ibuprofen (ADVIL,MOTRIN) 800 MG tablet Take 1 tablet (800 mg total) by mouth every 8 (eight) hours as needed. Patient not taking: Reported on 02/05/2020 09/18/18   Tilden Fossa, MD  insulin detemir (LEVEMIR) 100 UNIT/ML injection Inject 0.1 mLs (10 Units total) into the skin daily. Patient not taking: Reported on 02/05/2020 07/08/18   Wurst, [place], PA-C  Insulin Pen Needle (NOVOFINE) 30G X 8 MM MISC Inject 10 each into the skin as  needed. 07/08/18   Wurst, [place], PA-C  metFORMIN (GLUCOPHAGE) 1000 MG tablet Take 1 tablet (1,000 mg total) by mouth 2 (two) times daily with a meal. Patient not taking: Reported on 02/05/2020 07/08/18 08/07/18  Rennis Harding, PA-C     Allergies:    Allergies  Allergen Reactions   Percocet [Oxycodone-Acetaminophen] Hives, Itching, Palpitations and Other (See Comments)    nightmares   Ambien [Zolpidem] Other (See Comments)    Sleepwalking episodes    Social History:   Social History   Socioeconomic History   Marital status: Married    Spouse name: Not on file   Number of children: 1   Years of education: Not on file   Highest education level: Not on file  Occupational History   Not on file  Tobacco Use   Smoking status: Current Every Day Smoker   Smokeless tobacco: Never Used   Tobacco comment: has smoked for 14 years total - quit for 6 years at one point but has been smoking the  last 3 years  Substance and Sexual Activity   Alcohol use: Not Currently    Alcohol/week: 0.0 standard drinks   Drug use: Yes    Types: Cocaine, Marijuana    Comment: last used cocaine 2 days ago and marijuana 1 day ago   Sexual activity: Not on file  Other Topics Concern   Not on file  Social History Narrative   Not on file   Social Determinants of Health   Financial Resource Strain:    Difficulty of Paying Living Expenses:   Food Insecurity:    Worried About Programme researcher, broadcasting/film/video in the Last Year:    Barista in the Last Year:   Transportation Needs:    Freight forwarder (Medical):    Lack of Transportation (Non-Medical):   Physical Activity:    Days of Exercise per Week:    Minutes of Exercise per Session:   Stress:    Feeling of Stress :   Social Connections:    Frequency of Communication with Friends and Family:    Frequency of Social Gatherings with Friends and Family:    Attends Religious Services:    Active Member of Clubs or Organizations:    Attends Engineer, structural:    Marital Status:   Intimate Partner Violence:    Fear of Current or Ex-Partner:    Emotionally Abused:    Physically Abused:    Sexually Abused:     Family History:   The patient's family history includes Atrial fibrillation in her mother; Cystic fibrosis in her father; Diabetes in her maternal grandmother and mother; Heart disease in her maternal grandfather.    ROS:  Please see the history of present illness.  No fevers, chills or urinary symptoms. All other ROS reviewed and negative.     Physical Exam/Data:   Vitals:   02/05/20 0745 02/05/20 0800 02/05/20 0815 02/05/20 0830  BP: 117/83 131/75 123/76 129/79  Pulse: 94 96 97 97  Resp: 19 15 20 16   Temp:      TempSrc:      SpO2: 100% 100% 100% 100%  Weight:      Height:       No intake or output data in the 24 hours ending 02/05/20 0904 Last 3 Weights 02/05/2020 03/22/2019 09/18/2018    Weight (lbs) 160 lb 172 lb 175 lb  Weight (kg) 72.576 kg 78.019 kg 79.379 kg  Some encounter information is confidential and restricted.  Go to Review Flowsheets activity to see all data.     Body mass index is 26.63 kg/m.  General: Well developed, well nourished AAF, in no acute distress. Head: Normocephalic, atraumatic, sclera non-icteric, no xanthomas, nares are without discharge. Neck: Negative for carotid bruits. JVP not elevated. Lungs: Clear bilaterally to auscultation without wheezes, rales, or rhonchi. Breathing is unlabored. Heart: RRR S1 S2 without murmurs, rubs, or gallops.  Abdomen: Soft, non-tender, non-distended with normoactive bowel sounds. No rebound/guarding. Extremities: No clubbing or cyanosis. No edema. Distal pedal pulses are 2+ and equal bilaterally. Neuro: Alert and oriented X 3. Moves all extremities spontaneously. Psych:  Responds to questions appropriately with a normal affect.   EKG:  The ECG that was done today was personally reviewed and demonstrates NSR 91bpm, nonspeific TW changes, no acute ST changes. EKG reads out as borderline QT interval but hand calculated at  Relevant CV Studies: N/A  Laboratory Data:  High Sensitivity Troponin:   Recent Labs  Lab 02/05/20 0405 02/05/20 0555  TROPONINIHS 324* 541*      Chemistry Recent Labs  Lab 02/05/20 0405  NA 137  K 3.7  CL 106  CO2 21*  GLUCOSE 225*  BUN 7  CREATININE 0.67  CALCIUM 9.8  GFRNONAA >60  GFRAA >60  ANIONGAP 10    No results for input(s): PROT, ALBUMIN, AST, ALT, ALKPHOS, BILITOT in the last 168 hours. Hematology Recent Labs  Lab 02/05/20 0405  WBC 8.1  RBC 4.26  HGB 12.4  HCT 37.7  MCV 88.5  MCH 29.1  MCHC 32.9  RDW 12.8  PLT 236   BNPNo results for input(s): BNP, PROBNP in the last 168 hours.  DDimer No results for input(s): DDIMER in the last 168 hours.   Radiology/Studies:  DG Chest 2 View  Result Date: 02/05/2020 CLINICAL DATA:  Chest pain.  Nausea  and vomiting. EXAM: CHEST - 2 VIEW COMPARISON:  05/14/2016 FINDINGS: Numerous leads and wires project over the chest. Midline trachea.  Normal heart size and mediastinal contours. Sharp costophrenic angles.  No pneumothorax.  Clear lungs. Patient rotated minimally left. IMPRESSION: No active cardiopulmonary disease. Electronically Signed   By: Jeronimo Greaves M.D.   On: 02/05/2020 04:54   CT Angio Chest/Abd/Pel for Dissection W and/or Wo Contrast  Result Date: 02/05/2020 CLINICAL DATA:  Chest pain radiating into back and jaw. Began last night. EXAM: CT ANGIOGRAPHY CHEST, ABDOMEN AND PELVIS TECHNIQUE: Multidetector CT imaging through the chest, abdomen and pelvis was performed using the standard protocol during bolus administration of intravenous contrast. Multiplanar reconstructed images and MIPs were obtained and reviewed to evaluate the vascular anatomy. CONTRAST:  OMNIPAQUE IOHEXOL 350 MG/ML SOLN COMPARISON:  Chest radiograph 02/05/2020. CT stone study 03/20/2015. CTA chest 09/20/2014. FINDINGS: CTA CHEST FINDINGS Cardiovascular: Aortic atherosclerosis. No aneurysm or dissection. Normal heart size, without pericardial effusion. No central pulmonary embolism, on this non-dedicated study. Mediastinum/Nodes: No mediastinal or hilar adenopathy. Lungs/Pleura: No pleural fluid. No pneumothorax. Clear lungs. Musculoskeletal: No acute osseous abnormality. Review of the MIP images confirms the above findings. CTA ABDOMEN AND PELVIS FINDINGS VASCULAR Aorta: Normal aortic caliber, without dissection or aneurysm. Atherosclerotic irregularity within. Celiac: Widely patent. SMA: Widely patent. Renals: Widely patent. Accessory lower pole right renal artery. IMA: Patent Inflow: Normal in caliber. No significant stenosis. Veins: Poorly evaluated. Review of the MIP images confirms the above findings. NON-VASCULAR Hepatobiliary: Normal liver. Cholecystectomy, without biliary ductal dilatation. Pancreas: Normal, without mass  or ductal dilatation. Spleen: Normal in size, without  focal abnormality. Adrenals/Urinary Tract: Normal adrenal glands. Mildly scarred kidneys, without hydronephrosis. Subtle hypoattenuation in the lower pole left renal cortex on 158/7. Normal urinary bladder. Stomach/Bowel: Normal stomach, without wall thickening. Normal colon, appendix, and terminal ileum. Normal small bowel. Lymphatic: No abdominopelvic adenopathy. Reproductive: Hysterectomy. Left ovarian/adnexal fluid density mass is suboptimally evaluated, including at 4.8 x 4.0 cm on 235/7. Other: No significant free fluid.  No free intraperitoneal air. Musculoskeletal: No acute osseous abnormality. Review of the MIP images confirms the above findings. IMPRESSION: 1. No evidence of aortic dissection or aneurysm. 2. Subtle hypoattenuation in the lower pole left kidney cortex. Correlate with urinalysis to exclude pyelonephritis. 3. Left ovarian/adnexal mass. Given patient age, size, and fluid density this lesion, this is most likely a dominant cyst. If there are left lower quadrant symptoms, consider further evaluation with pelvic ultrasound. This recommendation follows ACR consensus guidelines: White Paper of the ACR Incidental Findings Committee II on Adnexal Findings. J Am Coll Radiol 2013:10:675-681. 4. Aortic Atherosclerosis (ICD10-I70.0). Electronically Signed   By: Jeronimo GreavesKyle  Talbot M.D.   On: 02/05/2020 07:35       TIMI Risk Score for Unstable Angina or Non-ST Elevation MI:   The patient's TIMI risk score is 4, which indicates a 20% risk of all cause mortality, new or recurrent myocardial infarction or need for urgent revascularization in the next 14 days.   Assessment and Plan:   1. Chest pain and suspected NSTEMI - story is suspicious for anginal picture/NSTEMI. However, unclear at this point whether this represents cocaine-induced vasospastic picture or underlying CAD. She last used cocaine 2 days prior. She has received 324mg  ASA and will be  started on heparin per pharmacy. Covid test is pending. Will plan to admit, continue to trend troponins, titrate statin, and check echocardiogram. Would avoid beta blocker acutely for now given + cocaine use. Continue NTG patch - consider changing to long-acting nitrate tomorrow. Anticipate that she will need cath on Monday to further evaluate. Per preliminarily discussion with Dr. Cristal Deerhristopher we will follow the clinical information and made definitive plans tomorrow. Patient aware to notify for any recurrent pain.  2. Essential HTN - controlled. Will tentatively hold lisinopril to allow additional room for nitrate/anti-anginal therapies if needed. We can resume early if blood pressure starts to increase.  3. Hyperlipidemia - will titrate atorvastatin to 80mg  given DM and probable NSTEMI. Add baseline LFTs. If the patient is tolerating statin at time of follow-up appointment, would consider rechecking liver function/lipid panel in 6-8 weeks.  4. History of substance abuse - she admits to episodic cocaine use including recently. We discussed dangers of ongoing use including fatal cardiac effects. She is agreeable to social work consult for resources. Will need ongoing f/u for her mental health. Also counseled on importance of smoking cessation.  5. Anxiety/depression/fibromyalgia/bipolar disorder - continue home regimen. She denies any SI/HI.  6. Incidental findings on CT including subtle hypoattenuation of left lower pole kidney cortex and left ovarian adnexal mass - no clinical signs of pyelonephritis. WBC normal, afebrile. Send UA for completeness. Adnexal mass felt likely to be a dominant cyst per CT report but can f/u as outpatient. No acute symptoms related to this. She actually reports being told this in the past as well.  7. Diabetes mellitus - hold Ozempic and metformin for now. Add SSI and CBG checks.  8. Chronic pain - hold NSAIDs and otherwise continue home regimen.  Severity of  Illness: The appropriate patient status for this patient is INPATIENT.  Inpatient status is judged to be reasonable and necessary in order to provide the required intensity of service to ensure the patient's safety. The patient's presenting symptoms, physical exam findings, and initial radiographic and laboratory data in the context of their chronic comorbidities is felt to place them at high risk for further clinical deterioration. Furthermore, it is not anticipated that the patient will be medically stable for discharge from the hospital within 2 midnights of admission. The following factors support the patient status of inpatient.   " The patient's presenting symptoms include chest pain, vomiting, SOB. " The worrisome physical exam findings include borderline tachycardia. " The initial radiographic and laboratory data are worrisome because of elevated troponin suggestive of MI. " The chronic co-morbidities include DM, HTN, HLD and otherwise outlined.  * I certify that at the point of admission it is my clinical judgment that the patient will require inpatient hospital care spanning beyond 2 midnights from the point of admission due to high intensity of service, high risk for further deterioration and high frequency of surveillance required.*    For questions or updates, please contact [place] Please consult [email or web address] for contact info under        Signed, Charlie Pitter, PA-C  02/05/2020 9:04 AM

## 2020-02-05 NOTE — ED Triage Notes (Signed)
Pt arrives via [facility] from home c/o acute chest pain (substernal with radiation through shoulder blades and to jaw), n/v (x3). VS: BP 132/64, HR 105, SPO2 98% RA, RR 14, CBG 245. EMS admin: ondansetron 4mg  IV, NS 150 mL, ASA 324mg  PO, nitro 0.4mg  SL. A&Ox4. Pain improved with admin of nitro, currently 3/10.

## 2020-02-05 NOTE — Progress Notes (Signed)
ANTICOAGULATION CONSULT NOTE - Initial Consult  Pharmacy Consult for heparin  Indication: chest pain/ACS  Allergies  Allergen Reactions  . Percocet [Oxycodone-Acetaminophen] Hives, Itching, Palpitations and Other (See Comments)    nightmares  . Ambien [Zolpidem] Other (See Comments)    Sleepwalking episodes    Patient Measurements: Height: 5\' 5"  (165.1 cm) Weight: 75.5 kg (166 lb 7.2 oz) IBW/kg (Calculated) : 57 Heparin Dosing Weight: 71.6 kg  Vital Signs: Temp: 98.2 F (36.8 C) (04/17 1406) Temp Source: Oral (04/17 1406) BP: 159/93 (04/17 1406) Pulse Rate: 101 (04/17 1406)  Labs: Recent Labs    02/05/20 0405 02/05/20 0555 02/05/20 1348  HGB 12.4  --   --   HCT 37.7  --   --   PLT 236  --   --   HEPARINUNFRC  --   --  0.38  CREATININE 0.67  --   --   TROPONINIHS 324* 541*  --     Estimated Creatinine Clearance: 89.3 mL/min (by C-G formula based on SCr of 0.67 mg/dL).   Medical History: Past Medical History:  Diagnosis Date  . Anemia   . Anxiety   . Arthritis   . Chronic abdominal pain   . Chronic back pain   . Chronic headache   . Chronic neck pain   . Depression   . Diabetes mellitus    adult onset dm-maintained on glipizide and metformin, pt states fasting glucose runs 110  . Fibromyalgia   . GERD (gastroesophageal reflux disease)   . Hyperlipidemia   . Hypertension    maintained on lisinopril hct, metoprolol-134/86 at pat visit  . Neuromuscular disorder ([place])   . Neuropathy   . Obesity   . PTSD (post-traumatic stress disorder)   . Substance abuse ([place])     Medications:  Medications Prior to Admission  Medication Sig Dispense Refill Last Dose  . atorvastatin (LIPITOR) 20 MG tablet Take 1 tablet (20 mg total) by mouth daily. 30 tablet 0 02/04/2020 at Unknown time  . dicyclomine (BENTYL) 10 MG capsule Take 1 capsule (10 mg total) by mouth 4 (four) times daily -  before meals and at bedtime. 120 capsule 11 02/04/2020 at Unknown time  . FLUoxetine  (PROZAC) 40 MG capsule Take 40 mg by mouth daily.   02/04/2020 at Unknown time  . fluticasone (FLOVENT HFA) 44 MCG/ACT inhaler Inhale 2 puffs into the lungs 2 (two) times daily as needed (for shortness of breath). 1 Inhaler 0 Past Month at Unknown time  . gabapentin (NEURONTIN) 300 MG capsule Take 300 mg by mouth 2 (two) times daily.   02/04/2020 at Unknown time  . hydrOXYzine (ATARAX/VISTARIL) 25 MG tablet Take 25 mg by mouth at bedtime as needed for itching or anxiety.   Past Week at Unknown time  . Ibuprofen-diphenhydrAMINE Cit 200-38 MG TABS Take 4 tablets by mouth at bedtime as needed (Pain and Sleep).   02/05/2020 at Unknown time  . lisinopril (PRINIVIL,ZESTRIL) 20 MG tablet Take 1 tablet (20 mg total) by mouth daily. 30 tablet 0 02/04/2020 at Unknown time  . meloxicam (MOBIC) 15 MG tablet Take 30 mg by mouth daily as needed for pain.    02/05/2020 at Unknown time  . metFORMIN (GLUCOPHAGE) 500 MG tablet Take 500 mg by mouth 2 (two) times daily.   02/04/2020 at Unknown time  . omeprazole (PRILOSEC) 20 MG capsule Take 1 capsule (20 mg total) by mouth daily. 30 capsule 0 02/04/2020 at Unknown time  . OZEMPIC, 0.25 OR 0.5  MG/DOSE, 2 MG/1.5ML SOPN Inject 0.5 mg into the skin every Saturday.   Past Week at Unknown time  . prazosin (MINIPRESS) 1 MG capsule Take 1 capsule (1 mg total) by mouth at bedtime. To prevent nightmares 30 capsule 0 02/04/2020 at Unknown time  . QUEtiapine (SEROQUEL) 50 MG tablet Take 50 mg by mouth at bedtime.   02/04/2020 at Unknown time  . traMADol (ULTRAM) 50 MG tablet Take 1 tablet by mouth 2 (two) times daily as needed for pain.   Past Month at Unknown time  . traZODone (DESYREL) 50 MG tablet Take 50 mg by mouth at bedtime.   02/04/2020 at Unknown time  . benzonatate (TESSALON) 100 MG capsule Take 1 capsule (100 mg total) by mouth every 8 (eight) hours. (Patient not taking: Reported on 02/05/2020) 21 capsule 0 Not Taking at Unknown time  . ergocalciferol (VITAMIN D2) 50000 units  capsule Take 1 capsule (50,000 Units total) once a week by mouth. (Patient not taking: Reported on 02/05/2020) 4 capsule 0 Not Taking at Unknown time  . gabapentin (NEURONTIN) 400 MG capsule Take 1 capsule (400 mg total) by mouth 3 (three) times daily. (Patient not taking: Reported on 02/05/2020) 90 capsule 0 Not Taking at Unknown time  . hydrOXYzine (ATARAX/VISTARIL) 50 MG tablet Take 1 tablet (50 mg total) by mouth 3 (three) times daily as needed for anxiety. (Patient not taking: Reported on 02/05/2020) 30 tablet 0 Not Taking at Unknown time  . ibuprofen (ADVIL,MOTRIN) 800 MG tablet Take 1 tablet (800 mg total) by mouth every 8 (eight) hours as needed. (Patient not taking: Reported on 02/05/2020) 21 tablet 0 Not Taking at Unknown time  . insulin detemir (LEVEMIR) 100 UNIT/ML injection Inject 0.1 mLs (10 Units total) into the skin daily. (Patient not taking: Reported on 02/05/2020) 10 mL 2 Not Taking at Unknown time  . Insulin Pen Needle (NOVOFINE) 30G X 8 MM MISC Inject 10 each into the skin as needed. 1 packet 1   . metFORMIN (GLUCOPHAGE) 1000 MG tablet Take 1 tablet (1,000 mg total) by mouth 2 (two) times daily with a meal. (Patient not taking: Reported on 02/05/2020) 60 tablet 1 Not Taking at Unknown time    Assessment: 49 year old female presents to the ED with substernal chest pain that was relieved with nitropaste. EKG without acute ischemic changes or evidence of pericarditis. Initial troponin 300 and second trending up to 541. CTA obtained and did not see evidence of dissection or PE. No anticoagulation PTA. Pharmacy has been consulted for heparin dosing.  Initial 6 hour heparin level therapeutic at 0.38 on 850 units/hour. H&H this morning wnl at 12.4/37.7, plts wnl 236.   Goal of Therapy:  Heparin level 0.3-0.7 units/ml Monitor platelets by anticoagulation protocol: Yes   Plan:  Continue heparin at 850 units/hr 6 hour confirmatory heparin level Daily heparin level and CBC Monitor for signs  of bleeding    Thank you,   Fara Olden, PharmD PGY-1 Pharmacy Resident   Please check amion for clinical pharmacist contact number

## 2020-02-06 ENCOUNTER — Inpatient Hospital Stay (HOSPITAL_COMMUNITY): Payer: BC Managed Care – PPO

## 2020-02-06 DIAGNOSIS — F1410 Cocaine abuse, uncomplicated: Secondary | ICD-10-CM | POA: Diagnosis not present

## 2020-02-06 DIAGNOSIS — I10 Essential (primary) hypertension: Secondary | ICD-10-CM | POA: Diagnosis not present

## 2020-02-06 DIAGNOSIS — I214 Non-ST elevation (NSTEMI) myocardial infarction: Secondary | ICD-10-CM

## 2020-02-06 DIAGNOSIS — E7800 Pure hypercholesterolemia, unspecified: Secondary | ICD-10-CM | POA: Diagnosis not present

## 2020-02-06 LAB — LIPID PANEL
Cholesterol: 181 mg/dL (ref 0–200)
HDL: 40 mg/dL — ABNORMAL LOW (ref >40)
LDL Cholesterol: 101 mg/dL — ABNORMAL HIGH (ref 0–99)
Total CHOL/HDL Ratio: 4.5 RATIO
Triglycerides: 201 mg/dL — ABNORMAL HIGH (ref <150)
VLDL: 40 mg/dL (ref 0–40)

## 2020-02-06 LAB — CBC WITH DIFFERENTIAL/PLATELET
Abs Immature Granulocytes: 0.01 10*3/uL (ref 0.00–0.07)
Basophils Absolute: 0.1 10*3/uL (ref 0.0–0.1)
Basophils Relative: 1 %
Eosinophils Absolute: 0.2 10*3/uL (ref 0.0–0.5)
Eosinophils Relative: 2 %
HCT: 37.3 % (ref 36.0–46.0)
Hemoglobin: 12.3 g/dL (ref 12.0–15.0)
Immature Granulocytes: 0 %
Lymphocytes Relative: 47 %
Lymphs Abs: 3.7 10*3/uL (ref 0.7–4.0)
MCH: 28.7 pg (ref 26.0–34.0)
MCHC: 33 g/dL (ref 30.0–36.0)
MCV: 87.1 fL (ref 80.0–100.0)
Monocytes Absolute: 0.4 10*3/uL (ref 0.1–1.0)
Monocytes Relative: 6 %
Neutro Abs: 3.4 10*3/uL (ref 1.7–7.7)
Neutrophils Relative %: 44 %
Platelets: 237 10*3/uL (ref 150–400)
RBC: 4.28 MIL/uL (ref 3.87–5.11)
RDW: 12.8 % (ref 11.5–15.5)
WBC: 7.8 10*3/uL (ref 4.0–10.5)
nRBC: 0 % (ref 0.0–0.2)

## 2020-02-06 LAB — ECHOCARDIOGRAM COMPLETE
Height: 65 in
Weight: 2547.2 oz

## 2020-02-06 LAB — BASIC METABOLIC PANEL
Anion gap: 11 (ref 5–15)
BUN: 11 mg/dL (ref 6–20)
CO2: 20 mmol/L — ABNORMAL LOW (ref 22–32)
Calcium: 10.2 mg/dL (ref 8.9–10.3)
Chloride: 106 mmol/L (ref 98–111)
Creatinine, Ser: 0.74 mg/dL (ref 0.44–1.00)
GFR calc Af Amer: >60 mL/min (ref >60)
GFR calc non Af Amer: >60 mL/min (ref >60)
Glucose, Bld: 198 mg/dL — ABNORMAL HIGH (ref 70–99)
Potassium: 4.1 mmol/L (ref 3.5–5.1)
Sodium: 137 mmol/L (ref 135–145)

## 2020-02-06 LAB — GLUCOSE, CAPILLARY
Glucose-Capillary: 182 mg/dL — ABNORMAL HIGH (ref 70–99)
Glucose-Capillary: 212 mg/dL — ABNORMAL HIGH (ref 70–99)
Glucose-Capillary: 231 mg/dL — ABNORMAL HIGH (ref 70–99)

## 2020-02-06 LAB — HEPARIN LEVEL (UNFRACTIONATED)
Heparin Unfractionated: 0.38 IU/mL (ref 0.30–0.70)
Heparin Unfractionated: 0.35 IU/mL (ref 0.30–0.70)

## 2020-02-06 LAB — TROPONIN I (HIGH SENSITIVITY): Troponin I (High Sensitivity): 686 ng/L (ref <18)

## 2020-02-06 MED ORDER — SODIUM CHLORIDE 0.9% FLUSH: 3.0000 mL | Freq: Two times a day (BID) | INTRAVENOUS | Status: DC

## 2020-02-06 MED ORDER — SODIUM CHLORIDE 0.9 % IV SOLN: 250.0000 mL | INTRAVENOUS | Status: DC | PRN

## 2020-02-06 MED ORDER — NITROGLYCERIN IN D5W 200-5 MCG/ML-% IV SOLN
0.0000 ug/min | INTRAVENOUS | Status: DC
  Administered 2020-02-06: 5 ug/min via INTRAVENOUS

## 2020-02-06 MED ORDER — ISOSORBIDE MONONITRATE ER 30 MG PO TB24
30.0000 mg | ORAL_TABLET | Freq: Every day | ORAL | Status: DC
  Administered 2020-02-06: 11:00:00 30 mg via ORAL
  Filled 2020-02-06: qty 1

## 2020-02-06 MED ORDER — SODIUM CHLORIDE 0.9 % WEIGHT BASED INFUSION
1.0000 mL/kg/h | INTRAVENOUS | Status: DC
  Administered 2020-02-07: 12:00:00 1 mL/kg/h via INTRAVENOUS

## 2020-02-06 MED ORDER — SODIUM CHLORIDE 0.9% FLUSH: 3.0000 mL | INTRAVENOUS | Status: DC | PRN

## 2020-02-06 MED ORDER — ASPIRIN 81 MG PO CHEW
81.0000 mg | CHEWABLE_TABLET | ORAL | Status: AC
  Administered 2020-02-07: 07:00:00 81 mg via ORAL
  Filled 2020-02-06: qty 1

## 2020-02-06 MED ORDER — NITROGLYCERIN IN D5W 200-5 MCG/ML-% IV SOLN
INTRAVENOUS | Status: AC
  Filled 2020-02-06: qty 250

## 2020-02-06 MED ORDER — SODIUM CHLORIDE 0.9 % WEIGHT BASED INFUSION: 3.0000 mL/kg/h | INTRAVENOUS | Status: DC

## 2020-02-06 MED ORDER — NITROGLYCERIN 2 % TD OINT
1.0000 [in_us] | TOPICAL_OINTMENT | Freq: Four times a day (QID) | TRANSDERMAL | Status: DC | PRN
  Administered 2020-02-06: 1 [in_us] via TOPICAL
  Filled 2020-02-06: qty 30

## 2020-02-06 NOTE — Progress Notes (Signed)
Pt with chest pain 7/10 radiating to jaw and back that felt "heavy and throbbing."  BP 150/94 (111) Pulse 124  pt had nausea and sweating. Paste was applied pain still 7/10.  sublingual nitro given X2 EKG obtained . Pt reports pain now 1/10 BP 120/74 (88) Pulse 112. Fransico Michael PA  Notified.  Everlean Cherry, RN

## 2020-02-06 NOTE — Progress Notes (Signed)
  Echocardiogram 2D Echocardiogram has been performed.  Vanessa Case F 02/06/2020, 2:39 PM

## 2020-02-06 NOTE — Progress Notes (Signed)
Progress Note  Patient Name: Vanessa Case Date of Encounter: 02/06/2020  Primary Cardiologist: Buford Dresser, MD   Subjective   Had brief chest pain overnight, self resolved. No further chest pain this AM. Reviewed options for management, she watched the cath video and is amenable.  Inpatient Medications    Scheduled Meds: . aspirin EC  81 mg Oral Daily  . atorvastatin  80 mg Oral Daily  . dicyclomine  10 mg Oral TID AC & HS  . FLUoxetine  40 mg Oral Daily  . gabapentin  300 mg Oral BID  . insulin aspart  0-9 Units Subcutaneous TID WC  . isosorbide mononitrate  30 mg Oral Daily  . pantoprazole  40 mg Oral Daily  . prazosin  1 mg Oral QHS  . QUEtiapine  50 mg Oral QHS  . sodium chloride flush  3 mL Intravenous Q12H  . traZODone  50 mg Oral QHS   Continuous Infusions: . sodium chloride    . heparin 1,000 Units/hr (02/06/20 0802)   PRN Meds: sodium chloride, budesonide, hydrOXYzine, nitroGLYCERIN, nitroGLYCERIN, ondansetron (ZOFRAN) IV, sodium chloride flush, traMADol   Vital Signs    Vitals:   02/05/20 2314 02/06/20 0014 02/06/20 0457 02/06/20 0745  BP: 126/85 139/89 121/74 (!) 143/77  Pulse:  96 95 99  Resp:  15 19 17   Temp:  98.2 F (36.8 C) 97.6 F (36.4 C) 98 F (36.7 C)  TempSrc:  Oral Oral Oral  SpO2:  99% 100% 99%  Weight:   72.2 kg   Height:        Intake/Output Summary (Last 24 hours) at 02/06/2020 1103 Last data filed at 02/06/2020 0800 Gross per 24 hour  Intake 1271.88 ml  Output --  Net 1271.88 ml   Last 3 Weights 02/06/2020 02/05/2020 02/05/2020  Weight (lbs) 159 lb 3.2 oz 166 lb 7.2 oz 160 lb  Weight (kg) 72.213 kg 75.5 kg 72.576 kg  Some encounter information is confidential and restricted. Go to Review Flowsheets activity to see all data.      Telemetry    Sinus rhythm with occasional sinus tachycardia - Personally Reviewed  ECG    Sinus tachycardia - Personally Reviewed  Physical Exam   GEN: Well nourished, well  developed in no acute distress HEENT: Normal, moist mucous membranes NECK: No JVD CARDIAC: regular rhythm, normal S1 and S2, no rubs or gallops. No murmur. VASCULAR: Radial and DP pulses 2+ bilaterally. No carotid bruits RESPIRATORY:  Clear to auscultation without rales, wheezing or rhonchi  ABDOMEN: Soft, non-tender, non-distended MUSCULOSKELETAL:  Ambulates independently SKIN: Warm and dry, no edema NEUROLOGIC:  Alert and oriented x 3. No focal neuro deficits noted. PSYCHIATRIC:  Normal affect   Labs    High Sensitivity Troponin:   Recent Labs  Lab 02/05/20 0405 02/05/20 0555 02/06/20 0737  TROPONINIHS 324* 541* 686*      Chemistry Recent Labs  Lab 02/05/20 0405 02/05/20 1211 02/06/20 0250  NA 137  --  137  K 3.7  --  4.1  CL 106  --  106  CO2 21*  --  20*  GLUCOSE 225*  --  198*  BUN 7  --  11  CREATININE 0.67  --  0.74  CALCIUM 9.8  --  10.2  PROT  --  6.9  --   ALBUMIN  --  3.8  --   AST  --  18  --   ALT  --  10  --  ALKPHOS  --  62  --   BILITOT  --  0.7  --   GFRNONAA >60  --  >60  GFRAA >60  --  >60  ANIONGAP 10  --  11     Hematology Recent Labs  Lab 02/05/20 0405 02/06/20 0250  WBC 8.1 7.8  RBC 4.26 4.28  HGB 12.4 12.3  HCT 37.7 37.3  MCV 88.5 87.1  MCH 29.1 28.7  MCHC 32.9 33.0  RDW 12.8 12.8  PLT 236 237    BNPNo results for input(s): BNP, PROBNP in the last 168 hours.   DDimer No results for input(s): DDIMER in the last 168 hours.   Radiology    DG Chest 2 View  Result Date: 02/05/2020 CLINICAL DATA:  Chest pain.  Nausea and vomiting. EXAM: CHEST - 2 VIEW COMPARISON:  05/14/2016 FINDINGS: Numerous leads and wires project over the chest. Midline trachea.  Normal heart size and mediastinal contours. Sharp costophrenic angles.  No pneumothorax.  Clear lungs. Patient rotated minimally left. IMPRESSION: No active cardiopulmonary disease. Electronically Signed   By: Jeronimo Greaves M.D.   On: 02/05/2020 04:54   CT Angio Chest/Abd/Pel for  Dissection W and/or Wo Contrast  Result Date: 02/05/2020 CLINICAL DATA:  Chest pain radiating into back and jaw. Began last night. EXAM: CT ANGIOGRAPHY CHEST, ABDOMEN AND PELVIS TECHNIQUE: Multidetector CT imaging through the chest, abdomen and pelvis was performed using the standard protocol during bolus administration of intravenous contrast. Multiplanar reconstructed images and MIPs were obtained and reviewed to evaluate the vascular anatomy. CONTRAST:  OMNIPAQUE IOHEXOL 350 MG/ML SOLN COMPARISON:  Chest radiograph 02/05/2020. CT stone study 03/20/2015. CTA chest 09/20/2014. FINDINGS: CTA CHEST FINDINGS Cardiovascular: Aortic atherosclerosis. No aneurysm or dissection. Normal heart size, without pericardial effusion. No central pulmonary embolism, on this non-dedicated study. Mediastinum/Nodes: No mediastinal or hilar adenopathy. Lungs/Pleura: No pleural fluid. No pneumothorax. Clear lungs. Musculoskeletal: No acute osseous abnormality. Review of the MIP images confirms the above findings. CTA ABDOMEN AND PELVIS FINDINGS VASCULAR Aorta: Normal aortic caliber, without dissection or aneurysm. Atherosclerotic irregularity within. Celiac: Widely patent. SMA: Widely patent. Renals: Widely patent. Accessory lower pole right renal artery. IMA: Patent Inflow: Normal in caliber. No significant stenosis. Veins: Poorly evaluated. Review of the MIP images confirms the above findings. NON-VASCULAR Hepatobiliary: Normal liver. Cholecystectomy, without biliary ductal dilatation. Pancreas: Normal, without mass or ductal dilatation. Spleen: Normal in size, without focal abnormality. Adrenals/Urinary Tract: Normal adrenal glands. Mildly scarred kidneys, without hydronephrosis. Subtle hypoattenuation in the lower pole left renal cortex on 158/7. Normal urinary bladder. Stomach/Bowel: Normal stomach, without wall thickening. Normal colon, appendix, and terminal ileum. Normal small bowel. Lymphatic: No abdominopelvic  adenopathy. Reproductive: Hysterectomy. Left ovarian/adnexal fluid density mass is suboptimally evaluated, including at 4.8 x 4.0 cm on 235/7. Other: No significant free fluid.  No free intraperitoneal air. Musculoskeletal: No acute osseous abnormality. Review of the MIP images confirms the above findings. IMPRESSION: 1. No evidence of aortic dissection or aneurysm. 2. Subtle hypoattenuation in the lower pole left kidney cortex. Correlate with urinalysis to exclude pyelonephritis. 3. Left ovarian/adnexal mass. Given patient age, size, and fluid density this lesion, this is most likely a dominant cyst. If there are left lower quadrant symptoms, consider further evaluation with pelvic ultrasound. This recommendation follows ACR consensus guidelines: White Paper of the ACR Incidental Findings Committee II on Adnexal Findings. J Am Coll Radiol 2013:10:675-681. 4. Aortic Atherosclerosis (ICD10-I70.0). Electronically Signed   By: Jeronimo Greaves M.D.   On: 02/05/2020  07:35    Cardiac Studies   Echo pending today  Patient Profile     49 y.o. female with DM, anxiety/depression, episodic cocaine use, borderline personality disorder, anemia, chronic pain, fibromyalgia, PTSD, HTN, HLD, neuropathy who presented to [facility] with chest pain. Found to have NSTEMI, complicated by recent cocaine use.  Assessment & Plan    NSTEMI, complicated by positive cocaine: -heparin drip -nitro paste in place for chest pain. Will add imdur and change nitro paste to PRN -echo pending -avoiding beta blocker given recent cocaine use. If needed, would use carvedilol that also has alpha blocker activity -given her comorbid conditions, I recommend cath on 4/19 to evaluate anatomy. We discussed diagnostic cath and PCI, she is amenable. -Risks and benefits of cardiac catheterization have been discussed with the patient.  These include bleeding, infection, kidney damage, stroke, heart attack, death.  The patient understands  these risks and is willing to proceed. -increased atorvastatin to 80 mg -stop NSAIDs -hsTn trend 324 -> 541 -> 686  Substance abuse: -she endorses recent cocaine use, UDS positive -she is agreeable to no future cocaine use. We discussed how this impacts the heart at length  Hyperlipidemia, mized:  -LDL 101 this admission, goal LDL <70 -increased atorvastatin to 80 mg daily as above -TG elevated to 201  Type II diabetes -restart ozempic (GLP1RA) as an outpatient -insulin with sliding scale while admitted -A1c 9.2 this admission  Hypertension: BP has ranged this admission, 105/86-159/93 -has not yet gotten AM meds. Holding lisinopril to allow for nitro. Add if able.  Anxiety, depression, fibromyalgia, bipolar disorder: -stable. Continue home regimen, except that we will hold NSAIDs  For questions or updates, please contact [facility] Please consult [email or web address] for contact info under        Signed, Jodelle Red, MD  02/06/2020, 11:03 AM

## 2020-02-06 NOTE — Progress Notes (Signed)
Patient care plan reviewed. VS stable. No acute distress. EKG Sinus rhythm to sinus tach on monitor. HR 90s. SPO2 99-100% on RA. BP WDL. Denies chest pain. Heparin drip titrated from 850 to 1,000 units per hour per pharmacist. Will continue to monitor.  Willa Frater, RN

## 2020-02-06 NOTE — Progress Notes (Signed)
Pt complaining of chest pain 7/10 radiating to jaw and back during echocardiogram. BP 143/94 (110). Sublingual nitro given x3 and chest pain reduced to 5/10.   Orders received from nitroglycerin gtt. Initiated at . Will continue to monitor.  Versie Starks, RN

## 2020-02-06 NOTE — Progress Notes (Signed)
ANTICOAGULATION CONSULT NOTE - Initial Consult  Pharmacy Consult for heparin  Indication: chest pain/ACS  Allergies  Allergen Reactions  . Percocet [Oxycodone-Acetaminophen] Hives, Itching, Palpitations and Other (See Comments)    nightmares  . Ambien [Zolpidem] Other (See Comments)    Sleepwalking episodes    Patient Measurements: Height: 5\' 5"  (165.1 cm) Weight: 72.2 kg (159 lb 3.2 oz) IBW/kg (Calculated) : 57 Heparin Dosing Weight: 71.6 kg  Vital Signs: Temp: 98 F (36.7 C) (04/18 0745) Temp Source: Oral (04/18 0745) BP: 143/77 (04/18 0745) Pulse Rate: 99 (04/18 0745)  Labs: Recent Labs    02/05/20 0405 02/05/20 0555 02/05/20 1348 02/05/20 1931 02/06/20 0250  HGB 12.4  --   --   --  12.3  HCT 37.7  --   --   --  37.3  PLT 236  --   --   --  237  HEPARINUNFRC  --   --  0.38 0.20* 0.38  CREATININE 0.67  --   --   --  0.74  TROPONINIHS 324* 541*  --   --   --     Estimated Creatinine Clearance: 87.5 mL/min (by C-G formula based on SCr of 0.74 mg/dL).   Medical History: Past Medical History:  Diagnosis Date  . Anemia   . Anxiety   . Arthritis   . Chronic abdominal pain   . Chronic back pain   . Chronic headache   . Chronic neck pain   . Depression   . Diabetes mellitus    adult onset dm-maintained on glipizide and metformin, pt states fasting glucose runs 110  . Fibromyalgia   . GERD (gastroesophageal reflux disease)   . Hyperlipidemia   . Hypertension    maintained on lisinopril hct, metoprolol-134/86 at pat visit  . Neuromuscular disorder ([facility])   . Neuropathy   . Obesity   . PTSD (post-traumatic stress disorder)   . Substance abuse ([facility])     Medications:  Medications Prior to Admission  Medication Sig Dispense Refill Last Dose  . atorvastatin (LIPITOR) 20 MG tablet Take 1 tablet (20 mg total) by mouth daily. 30 tablet 0 02/04/2020 at Unknown time  . dicyclomine (BENTYL) 10 MG capsule Take 1 capsule (10 mg total) by mouth 4 (four) times daily -   before meals and at bedtime. 120 capsule 11 02/04/2020 at Unknown time  . FLUoxetine (PROZAC) 40 MG capsule Take 40 mg by mouth daily.   02/04/2020 at Unknown time  . fluticasone (FLOVENT HFA) 44 MCG/ACT inhaler Inhale 2 puffs into the lungs 2 (two) times daily as needed (for shortness of breath). 1 Inhaler 0 Past Month at Unknown time  . gabapentin (NEURONTIN) 300 MG capsule Take 300 mg by mouth 2 (two) times daily.   02/04/2020 at Unknown time  . hydrOXYzine (ATARAX/VISTARIL) 25 MG tablet Take 25 mg by mouth at bedtime as needed for itching or anxiety.   Past Week at Unknown time  . Ibuprofen-diphenhydrAMINE Cit 200-38 MG TABS Take 4 tablets by mouth at bedtime as needed (Pain and Sleep).   02/05/2020 at Unknown time  . lisinopril (PRINIVIL,ZESTRIL) 20 MG tablet Take 1 tablet (20 mg total) by mouth daily. 30 tablet 0 02/04/2020 at Unknown time  . meloxicam (MOBIC) 15 MG tablet Take 30 mg by mouth daily as needed for pain.    02/05/2020 at Unknown time  . metFORMIN (GLUCOPHAGE) 500 MG tablet Take 500 mg by mouth 2 (two) times daily.   02/04/2020 at Unknown time  .  omeprazole (PRILOSEC) 20 MG capsule Take 1 capsule (20 mg total) by mouth daily. 30 capsule 0 02/04/2020 at Unknown time  . OZEMPIC, 0.25 OR 0.5 MG/DOSE, 2 MG/1.5ML SOPN Inject 0.5 mg into the skin every Saturday.   Past Week at Unknown time  . prazosin (MINIPRESS) 1 MG capsule Take 1 capsule (1 mg total) by mouth at bedtime. To prevent nightmares 30 capsule 0 02/04/2020 at Unknown time  . QUEtiapine (SEROQUEL) 50 MG tablet Take 50 mg by mouth at bedtime.   02/04/2020 at Unknown time  . traMADol (ULTRAM) 50 MG tablet Take 1 tablet by mouth 2 (two) times daily as needed for pain.   Past Month at Unknown time  . traZODone (DESYREL) 50 MG tablet Take 50 mg by mouth at bedtime.   02/04/2020 at Unknown time  . benzonatate (TESSALON) 100 MG capsule Take 1 capsule (100 mg total) by mouth every 8 (eight) hours. (Patient not taking: Reported on 02/05/2020) 21  capsule 0 Not Taking at Unknown time  . ergocalciferol (VITAMIN D2) 50000 units capsule Take 1 capsule (50,000 Units total) once a week by mouth. (Patient not taking: Reported on 02/05/2020) 4 capsule 0 Not Taking at Unknown time  . gabapentin (NEURONTIN) 400 MG capsule Take 1 capsule (400 mg total) by mouth 3 (three) times daily. (Patient not taking: Reported on 02/05/2020) 90 capsule 0 Not Taking at Unknown time  . hydrOXYzine (ATARAX/VISTARIL) 50 MG tablet Take 1 tablet (50 mg total) by mouth 3 (three) times daily as needed for anxiety. (Patient not taking: Reported on 02/05/2020) 30 tablet 0 Not Taking at Unknown time  . ibuprofen (ADVIL,MOTRIN) 800 MG tablet Take 1 tablet (800 mg total) by mouth every 8 (eight) hours as needed. (Patient not taking: Reported on 02/05/2020) 21 tablet 0 Not Taking at Unknown time  . insulin detemir (LEVEMIR) 100 UNIT/ML injection Inject 0.1 mLs (10 Units total) into the skin daily. (Patient not taking: Reported on 02/05/2020) 10 mL 2 Not Taking at Unknown time  . Insulin Pen Needle (NOVOFINE) 30G X 8 MM MISC Inject 10 each into the skin as needed. 1 packet 1   . metFORMIN (GLUCOPHAGE) 1000 MG tablet Take 1 tablet (1,000 mg total) by mouth 2 (two) times daily with a meal. (Patient not taking: Reported on 02/05/2020) 60 tablet 1 Not Taking at Unknown time    Assessment: 49 year old female presents to the ED with substernal chest pain that was relieved with nitropaste. EKG without acute ischemic changes or evidence of pericarditis. Initial troponin 300 and second trending up to 541. CTA obtained and did not see evidence of dissection or PE. No anticoagulation PTA. Pharmacy has been consulted for heparin dosing.  6 hour heparin level this morning therapeutic at 0.38 after rate increase to 1000 units/hour. H&H stable at 12.3/37.3, plts wnl at 237. Labs drawn at [place] on 4/18, a confirmatory heparin level ordered for 0930 this morning. Resulting confirmatory heparin level remains  therapeutic at 0.35.   Goal of Therapy:  Heparin level 0.3-0.7 units/ml Monitor platelets by anticoagulation protocol: Yes   Plan:  Empirically increase heparin to 1050 units/hr to prevent another drop of heparin level Daily heparin level and CBC Monitor for signs of bleeding    Thank you,   Eddie Candle, PharmD PGY-1 Pharmacy Resident   Please check amion for clinical pharmacist contact number

## 2020-02-06 NOTE — Progress Notes (Signed)
CRITICAL VALUE ALERT  Critical Value: troponin 686 no active chest pain Date & Time Notied: 02/06/20  Provider Notified:  Fransico Michael PA   Everlean Cherry, RN

## 2020-02-07 ENCOUNTER — Encounter (HOSPITAL_COMMUNITY)
Admission: EM | Disposition: A | Discharge status: Home / Self Care | Payer: Self-pay | Source: Home / Self Care | Attending: Cardiology

## 2020-02-07 ENCOUNTER — Other Ambulatory Visit: Payer: Self-pay

## 2020-02-07 DIAGNOSIS — I2510 Atherosclerotic heart disease of native coronary artery without angina pectoris: Secondary | ICD-10-CM | POA: Diagnosis not present

## 2020-02-07 DIAGNOSIS — I160 Hypertensive urgency: Secondary | ICD-10-CM

## 2020-02-07 DIAGNOSIS — F1410 Cocaine abuse, uncomplicated: Secondary | ICD-10-CM | POA: Diagnosis not present

## 2020-02-07 DIAGNOSIS — I214 Non-ST elevation (NSTEMI) myocardial infarction: Secondary | ICD-10-CM | POA: Diagnosis not present

## 2020-02-07 HISTORY — PX: CORONARY STENT INTERVENTION: CATH118234

## 2020-02-07 HISTORY — PX: LEFT HEART CATH AND CORONARY ANGIOGRAPHY: CATH118249

## 2020-02-07 LAB — GLUCOSE, CAPILLARY
Glucose-Capillary: 153 mg/dL — ABNORMAL HIGH (ref 70–99)
Glucose-Capillary: 243 mg/dL — ABNORMAL HIGH (ref 70–99)
Glucose-Capillary: 148 mg/dL — ABNORMAL HIGH (ref 70–99)
Glucose-Capillary: 178 mg/dL — ABNORMAL HIGH (ref 70–99)
Glucose-Capillary: 229 mg/dL — ABNORMAL HIGH (ref 70–99)

## 2020-02-07 LAB — CBC WITH DIFFERENTIAL/PLATELET
Abs Immature Granulocytes: 0.02 10*3/uL (ref 0.00–0.07)
Basophils Absolute: 0 10*3/uL (ref 0.0–0.1)
Basophils Relative: 1 %
Eosinophils Absolute: 0.1 10*3/uL (ref 0.0–0.5)
Eosinophils Relative: 1 %
HCT: 35.3 % — ABNORMAL LOW (ref 36.0–46.0)
Hemoglobin: 11.6 g/dL — ABNORMAL LOW (ref 12.0–15.0)
Immature Granulocytes: 0 %
Lymphocytes Relative: 38 %
Lymphs Abs: 2.8 10*3/uL (ref 0.7–4.0)
MCH: 29.2 pg (ref 26.0–34.0)
MCHC: 32.9 g/dL (ref 30.0–36.0)
MCV: 88.9 fL (ref 80.0–100.0)
Monocytes Absolute: 0.4 10*3/uL (ref 0.1–1.0)
Monocytes Relative: 6 %
Neutro Abs: 3.9 10*3/uL (ref 1.7–7.7)
Neutrophils Relative %: 54 %
Platelets: 213 10*3/uL (ref 150–400)
RBC: 3.97 MIL/uL (ref 3.87–5.11)
RDW: 12.8 % (ref 11.5–15.5)
WBC: 7.2 10*3/uL (ref 4.0–10.5)
nRBC: 0 % (ref 0.0–0.2)

## 2020-02-07 LAB — HEPARIN LEVEL (UNFRACTIONATED): Heparin Unfractionated: 0.34 IU/mL (ref 0.30–0.70)

## 2020-02-07 LAB — POCT ACTIVATED CLOTTING TIME: Activated Clotting Time: 279 seconds

## 2020-02-07 LAB — SURGICAL PCR SCREEN
MRSA, PCR: NEGATIVE
Staphylococcus aureus: NEGATIVE

## 2020-02-07 SURGERY — LEFT HEART CATH AND CORONARY ANGIOGRAPHY
Anesthesia: LOCAL

## 2020-02-07 MED ORDER — NITROGLYCERIN 1 MG/10 ML FOR IR/CATH LAB
INTRA_ARTERIAL | Status: DC | PRN
  Administered 2020-02-07 (×2): 200 ug via INTRACORONARY

## 2020-02-07 MED ORDER — HEPARIN SODIUM (PORCINE) 1000 UNIT/ML IJ SOLN
INTRAMUSCULAR | Status: DC | PRN
  Administered 2020-02-07 (×2): 4000 [IU] via INTRAVENOUS

## 2020-02-07 MED ORDER — HEPARIN (PORCINE) IN NACL 1000-0.9 UT/500ML-% IV SOLN
INTRAVENOUS | Status: AC
  Filled 2020-02-07: qty 1000

## 2020-02-07 MED ORDER — HEPARIN SODIUM (PORCINE) 1000 UNIT/ML IJ SOLN
INTRAMUSCULAR | Status: AC
  Filled 2020-02-07: qty 1

## 2020-02-07 MED ORDER — LIDOCAINE HCL (PF) 1 % IJ SOLN
INTRAMUSCULAR | Status: AC
  Filled 2020-02-07: qty 30

## 2020-02-07 MED ORDER — VERAPAMIL HCL 2.5 MG/ML IV SOLN
INTRAVENOUS | Status: DC | PRN
  Administered 2020-02-07: 10 mL via INTRA_ARTERIAL

## 2020-02-07 MED ORDER — TICAGRELOR 90 MG PO TABS
ORAL_TABLET | ORAL | Status: DC | PRN
  Administered 2020-02-07: 180 mg via ORAL

## 2020-02-07 MED ORDER — FENTANYL CITRATE (PF) 100 MCG/2ML IJ SOLN
INTRAMUSCULAR | Status: DC | PRN
  Administered 2020-02-07: 25 ug via INTRAVENOUS

## 2020-02-07 MED ORDER — MIDAZOLAM HCL 2 MG/2ML IJ SOLN
INTRAMUSCULAR | Status: DC | PRN
  Administered 2020-02-07: 2 mg via INTRAVENOUS

## 2020-02-07 MED ORDER — FENTANYL CITRATE (PF) 100 MCG/2ML IJ SOLN
INTRAMUSCULAR | Status: AC
  Filled 2020-02-07: qty 2

## 2020-02-07 MED ORDER — IOHEXOL 350 MG/ML SOLN
INTRAVENOUS | Status: DC | PRN
  Administered 2020-02-07: 105 mL

## 2020-02-07 MED ORDER — NITROGLYCERIN 1 MG/10 ML FOR IR/CATH LAB
INTRA_ARTERIAL | Status: AC
  Filled 2020-02-07: qty 10

## 2020-02-07 MED ORDER — LIDOCAINE HCL (PF) 1 % IJ SOLN
INTRAMUSCULAR | Status: DC | PRN
  Administered 2020-02-07: 2 mL

## 2020-02-07 MED ORDER — HEPARIN (PORCINE) IN NACL 1000-0.9 UT/500ML-% IV SOLN
INTRAVENOUS | Status: DC | PRN
  Administered 2020-02-07 (×2): 500 mL

## 2020-02-07 MED ORDER — VERAPAMIL HCL 2.5 MG/ML IV SOLN
INTRAVENOUS | Status: AC
  Filled 2020-02-07: qty 2

## 2020-02-07 MED ORDER — MIDAZOLAM HCL 2 MG/2ML IJ SOLN
INTRAMUSCULAR | Status: AC
  Filled 2020-02-07: qty 2

## 2020-02-07 MED ORDER — TICAGRELOR 90 MG PO TABS
ORAL_TABLET | ORAL | Status: AC
  Filled 2020-02-07: qty 2

## 2020-02-07 SURGICAL SUPPLY — 17 items
BALLN SAPPHIRE 2.0X12 (BALLOONS) ×2
BALLOON SAPPHIRE 2.0X12 (BALLOONS) IMPLANT
CATH INFINITI 5FR ANG PIGTAIL (CATHETERS) ×1 IMPLANT
CATH LAUNCHER 5F EBU3.5 (CATHETERS) ×1 IMPLANT
CATH OPTITORQUE TIG 4.0 5F (CATHETERS) ×1 IMPLANT
DEVICE RAD COMP TR BAND LRG (VASCULAR PRODUCTS) ×1 IMPLANT
GLIDESHEATH SLEND SS 6F .021 (SHEATH) ×1 IMPLANT
GUIDEWIRE INQWIRE 1.5J.035X260 (WIRE) IMPLANT
INQWIRE 1.5J .035X260CM (WIRE) ×2
KIT ENCORE 26 ADVANTAGE (KITS) ×1 IMPLANT
KIT HEART LEFT (KITS) ×2 IMPLANT
PACK CARDIAC CATHETERIZATION (CUSTOM PROCEDURE TRAY) ×2 IMPLANT
SHEATH PROBE COVER 6X72 (BAG) ×1 IMPLANT
STENT RESOLUTE ONYX 2.5X12 (Permanent Stent) ×1 IMPLANT
TRANSDUCER W/STOPCOCK (MISCELLANEOUS) ×2 IMPLANT
TUBING CIL FLEX 10 FLL-RA (TUBING) ×2 IMPLANT
WIRE ASAHI PROWATER 180CM (WIRE) ×1 IMPLANT

## 2020-02-07 NOTE — Interval H&P Note (Signed)
History and Physical Interval Note:  02/07/2020 2:03 PM  Vanessa Case  has presented today for surgery, with the diagnosis of NSTEMI.  The various methods of treatment have been discussed with the patient and family. After consideration of risks, benefits and other options for treatment, the patient has consented to  Procedure(s): LEFT HEART CATH AND CORONARY ANGIOGRAPHY (N/A)  PERCUTANEOUS CORONARY INTERVENTION  as a surgical intervention.  The patient's history has been reviewed, patient examined, no change in status, stable for surgery.  I have reviewed the patient's chart and labs.  Questions were answered to the patient's satisfaction.    Cath Lab Visit (complete for each Cath Lab visit)  Clinical Evaluation Leading to the Procedure:   ACS: Yes.    Non-ACS:    Anginal Classification: CCS IV  Anti-ischemic medical therapy: Minimal Therapy (1 class of medications)  Non-Invasive Test Results: No non-invasive testing performed  Prior CABG: No previous CABG   Bryan Lemma

## 2020-02-07 NOTE — Progress Notes (Signed)
Pt woke up and had chest pain at 12:29am. Pain scale 6-7/10. Titrated NTG gtt from 5 to 10 mcg/min. Pt tolerated well, chest pain went down to no pain in 5 minutes. HR 109, BP 143/87 mHg, RR 23, SPO2 97% on room air. Will continue to monitor.  Filiberto Pinks, RN

## 2020-02-07 NOTE — Progress Notes (Signed)
Progress Note  Patient Name: Vanessa Case Date of Encounter: 02/07/2020  Primary Cardiologist: Jodelle Red, MD   Subjective   Had brief chest at 7:15 am this morning, now comfortable. Awaiting cath this afternoon.  Inpatient Medications    Scheduled Meds: . aspirin EC  81 mg Oral Daily  . atorvastatin  80 mg Oral Daily  . dicyclomine  10 mg Oral TID AC & HS  . FLUoxetine  40 mg Oral Daily  . gabapentin  300 mg Oral BID  . insulin aspart  0-9 Units Subcutaneous TID WC  . pantoprazole  40 mg Oral Daily  . prazosin  1 mg Oral QHS  . QUEtiapine  50 mg Oral QHS  . sodium chloride flush  3 mL Intravenous Q12H  . sodium chloride flush  3 mL Intravenous Q12H  . traZODone  50 mg Oral QHS   Continuous Infusions: . sodium chloride    . sodium chloride    . sodium chloride 1 mL/kg/hr (02/07/20 0500)  . heparin 1,050 Units/hr (02/07/20 0908)  . nitroGLYCERIN 15 mcg/min (02/07/20 0725)   PRN Meds: sodium chloride, sodium chloride, budesonide, hydrOXYzine, nitroGLYCERIN, nitroGLYCERIN, ondansetron (ZOFRAN) IV, sodium chloride flush, sodium chloride flush, traMADol   Vital Signs    Vitals:   02/07/20 0511 02/07/20 0600 02/07/20 0723 02/07/20 0729  BP: 122/76  (!) 156/89 127/85  Pulse: 94 91 (!) 104 98  Resp: 17 (!) 21 (!) 21 20  Temp: 98.2 F (36.8 C)  98.1 F (36.7 C)   TempSrc: Oral  Oral   SpO2: 100% 99% 97% 99%  Weight: 73.2 kg     Height:        Intake/Output Summary (Last 24 hours) at 02/07/2020 1102 Last data filed at 02/07/2020 0600 Gross per 24 hour  Intake 627.6 ml  Output --  Net 627.6 ml   Last 3 Weights 02/07/2020 02/06/2020 02/05/2020  Weight (lbs) 161 lb 4.8 oz 159 lb 3.2 oz 166 lb 7.2 oz  Weight (kg) 73.165 kg 72.213 kg 75.5 kg  Some encounter information is confidential and restricted. Go to Review Flowsheets activity to see all data.      Telemetry    Sinus rhythm with occasional sinus tachycardia - Personally Reviewed  ECG     Sinus tachycardia - Personally Reviewed  Physical Exam   GEN: Well nourished, well developed in no acute distress HEENT: Normal, moist mucous membranes NECK: No JVD CARDIAC: regular rhythm, normal S1 and S2, no rubs or gallops. No murmur. VASCULAR: Radial and DP pulses 2+ bilaterally. No carotid bruits RESPIRATORY:  Clear to auscultation without rales, wheezing or rhonchi  ABDOMEN: Soft, non-tender, non-distended MUSCULOSKELETAL:  Ambulates independently SKIN: Warm and dry, no edema NEUROLOGIC:  Alert and oriented x 3. No focal neuro deficits noted. PSYCHIATRIC:  Normal affect   Labs    High Sensitivity Troponin:   Recent Labs  Lab 02/05/20 0405 02/05/20 0555 02/06/20 0737  TROPONINIHS 324* 541* 686*      Chemistry Recent Labs  Lab 02/05/20 0405 02/05/20 1211 02/06/20 0250  NA 137  --  137  K 3.7  --  4.1  CL 106  --  106  CO2 21*  --  20*  GLUCOSE 225*  --  198*  BUN 7  --  11  CREATININE 0.67  --  0.74  CALCIUM 9.8  --  10.2  PROT  --  6.9  --   ALBUMIN  --  3.8  --  AST  --  18  --   ALT  --  10  --   ALKPHOS  --  62  --   BILITOT  --  0.7  --   GFRNONAA >60  --  >60  GFRAA >60  --  >60  ANIONGAP 10  --  11     Hematology Recent Labs  Lab 02/05/20 0405 02/06/20 0250 02/07/20 0303  WBC 8.1 7.8 7.2  RBC 4.26 4.28 3.97  HGB 12.4 12.3 11.6*  HCT 37.7 37.3 35.3*  MCV 88.5 87.1 88.9  MCH 29.1 28.7 29.2  MCHC 32.9 33.0 32.9  RDW 12.8 12.8 12.8  PLT 236 237 213    BNPNo results for input(s): BNP, PROBNP in the last 168 hours.   DDimer No results for input(s): DDIMER in the last 168 hours.   Radiology    ECHOCARDIOGRAM COMPLETE  Result Date: 02/06/2020    ECHOCARDIOGRAM REPORT   Patient Name:   Vanessa Case [facility] Date of Exam: 02/06/2020 Medical Rec #:  [id number]             Height:       65.0 in Accession #:    [id number]            Weight:       159.2 lb Date of Birth:  Aug 02, 1971            BSA:          1.795 m Patient Age:    49 years               BP:           120/74 mmHg Patient Gender: F                     HR:           103 bpm. Exam Location:  Inpatient Procedure: 2D Echo, Cardiac Doppler and Color Doppler Indications:     121-121.4 ST elevation (STEMI) and non-ST elevation (NSTEMI)                  nyocardial infarction  History:         Patient has no prior history of Echocardiogram examinations.                  Chest pain 7 out of 10 during exam. RN at bedside.  Sonographer:     Merrie Roof RDCS Referring Phys:  [place] Diagnosing Phys: Oswaldo Milian MD IMPRESSIONS  1. Left ventricular ejection fraction, by estimation, is 55 to 60%. The left ventricle has normal function. There is mild left ventricular hypertrophy. Left ventricular diastolic parameters are indeterminate.  2. Right ventricular systolic function is normal. The right ventricular size is normal. Tricuspid regurgitation signal is inadequate for assessing PA pressure.  3. Left atrial size was mildly dilated.  4. The mitral valve is normal in structure. No evidence of mitral valve regurgitation.  5. The aortic valve is tricuspid. Aortic valve regurgitation is not visualized. Mild to moderate aortic valve sclerosis/calcification is present, without any evidence of aortic stenosis.  6. The inferior vena cava is normal in size with greater than 50% respiratory variability, suggesting right atrial pressure of 3 mmHg. FINDINGS  Left Ventricle: Left ventricular ejection fraction, by estimation, is 55 to 60%. The left ventricle has normal function. The left ventricle has no regional wall motion abnormalities. The left ventricular internal cavity size was normal in size.  There is  mild left ventricular hypertrophy. Left ventricular diastolic parameters are indeterminate. Right Ventricle: The right ventricular size is normal. No increase in right ventricular wall thickness. Right ventricular systolic function is normal. Tricuspid regurgitation signal is inadequate for  assessing PA pressure. Left Atrium: Left atrial size was mildly dilated. Right Atrium: Right atrial size was normal in size. Pericardium: Trivial pericardial effusion is present. Mitral Valve: The mitral valve is normal in structure. No evidence of mitral valve regurgitation. Tricuspid Valve: The tricuspid valve is normal in structure. Tricuspid valve regurgitation is not demonstrated. Aortic Valve: The aortic valve is tricuspid. Aortic valve regurgitation is not visualized. Mild to moderate aortic valve sclerosis/calcification is present, without any evidence of aortic stenosis. Pulmonic Valve: The pulmonic valve was not well visualized. Pulmonic valve regurgitation is not visualized. Aorta: The aortic root is normal in size and structure. Venous: The inferior vena cava is normal in size with greater than 50% respiratory variability, suggesting right atrial pressure of 3 mmHg. IAS/Shunts: The interatrial septum was not well visualized.  LEFT VENTRICLE PLAX 2D LVIDd:         3.60 cm     Diastology LVIDs:         2.70 cm     LV e' lateral: 5.98 cm/s LV PW:         1.10 cm     LV e' medial:  4.90 cm/s LV IVS:        1.10 cm LVOT diam:     1.70 cm LV SV:         38 LV SV Index:   21 LVOT Area:     2.27 cm  LV Volumes (MOD) LV vol d, MOD A4C: 74.8 ml LV vol s, MOD A4C: 35.3 ml LV SV MOD A4C:     74.8 ml RIGHT VENTRICLE             IVC RV Basal diam:  2.70 cm     IVC diam: 1.80 cm RV S prime:     21.60 cm/s TAPSE (M-mode): 2.4 cm LEFT ATRIUM           Index       RIGHT ATRIUM           Index LA diam:      3.80 cm 2.12 cm/m  RA Area:     11.00 cm LA Vol (A2C): 65.9 ml 36.71 ml/m RA Volume:   22.40 ml  12.48 ml/m LA Vol (A4C): 29.5 ml 16.43 ml/m  AORTIC VALVE LVOT Vmax:   95.80 cm/s LVOT Vmean:  56.600 cm/s LVOT VTI:    0.169 m  AORTA Ao Root diam: 2.80 cm  SHUNTS Systemic VTI:  0.17 m Systemic Diam: 1.70 cm Epifanio Lesches MD Electronically signed by Epifanio Lesches MD Signature Date/Time:  02/06/2020/6:05:52 PM    Final (Updated)     Cardiac Studies   Echo 02/06/2020  1. Left ventricular ejection fraction, by estimation, is 55 to 60%. The  left ventricle has normal function. There is mild left ventricular  hypertrophy. Left ventricular diastolic parameters are indeterminate.  2. Right ventricular systolic function is normal. The right ventricular  size is normal. Tricuspid regurgitation signal is inadequate for assessing  PA pressure.  3. Left atrial size was mildly dilated.  4. The mitral valve is normal in structure. No evidence of mitral valve  regurgitation.  5. The aortic valve is tricuspid. Aortic valve regurgitation is not  visualized. Mild to moderate aortic valve sclerosis/calcification  is  present, without any evidence of aortic stenosis.  6. The inferior vena cava is normal in size with greater than 50%  respiratory variability, suggesting right atrial pressure of 3 mmHg.   Patient Profile     49 y.o. female with DM, anxiety/depression, episodic cocaine use, borderline personality disorder, anemia, chronic pain, fibromyalgia, PTSD, HTN, HLD, neuropathy who presented to [facility] with chest pain. Found to have NSTEMI, complicated by recent cocaine use.  Assessment & Plan    NSTEMI, complicated by positive cocaine: -heparin drip until cath -nitro paste in place for chest pain. Will add imdur and change nitro paste to PRN -echo shows preserved LVEF 55-60%, no regional WMA -avoiding beta blocker given recent cocaine use. -given her comorbid conditions, I recommend cath on 4/19 to evaluate anatomy. We discussed diagnostic cath and PCI, she is amenable. -Risks and benefits of cardiac catheterization have been discussed with the patient.  These include bleeding, infection, kidney damage, stroke, heart attack, death.  The patient understands these risks and is willing to proceed. -increased atorvastatin to 80 mg -stop NSAIDs -hsTn trend 324 -> 541  -> 686 - HRs remain in 200', I will start cardizem CD 120 mg po daily  Substance abuse: -she endorses recent cocaine use, UDS positive -she is agreeable to no future cocaine use. We discussed how this impacts the heart at length  Hyperlipidemia, mized:  -LDL 101 this admission, goal LDL <70 -increased atorvastatin to 80 mg daily as above -TG elevated to 201  Type II diabetes -restart ozempic (GLP1RA) as an outpatient -insulin with sliding scale while admitted -A1c 9.2 this admission  Hypertension: BP has ranged this admission, 105/86-159/93 -has not yet gotten AM meds. Holding lisinopril to allow for nitro. Add if able.  Anxiety, depression, fibromyalgia, bipolar disorder: -stable. Continue home regimen, except that we will hold NSAIDs  For questions or updates, please contact [facility] Please consult [email or web address] for contact info under    Signed, Tobias Alexander, MD  02/07/2020, 11:02 AM

## 2020-02-07 NOTE — Progress Notes (Signed)
ANTICOAGULATION CONSULT NOTE Pharmacy Consult for heparin  Indication: chest pain/ACS  Allergies  Allergen Reactions  . Percocet [Oxycodone-Acetaminophen] Hives, Itching, Palpitations and Other (See Comments)    nightmares  . Ambien [Zolpidem] Other (See Comments)    Sleepwalking episodes    Patient Measurements: Height: 5\' 5"  (165.1 cm) Weight: 73.2 kg (161 lb 4.8 oz) IBW/kg (Calculated) : 57 Heparin Dosing Weight: 71.6 kg  Vital Signs: Temp: 98.1 F (36.7 C) (04/19 0723) Temp Source: Oral (04/19 0723) BP: 127/85 (04/19 0729) Pulse Rate: 98 (04/19 0729)  Labs: Recent Labs     0000 02/05/20 0405 02/05/20 0555 02/05/20 1348 02/06/20 0250 02/06/20 0737 02/06/20 0940 02/07/20 0303  HGB   < > 12.4  --   --  12.3  --   --  11.6*  HCT  --  37.7  --   --  37.3  --   --  35.3*  PLT  --  236  --   --  237  --   --  213  HEPARINUNFRC  --   --   --    < > 0.38  --  0.35 0.34  CREATININE  --  0.67  --   --  0.74  --   --   --   TROPONINIHS  --  324* 541*  --   --  686*  --   --    < > = values in this interval not displayed.    Estimated Creatinine Clearance: 88.1 mL/min (by C-G formula based on SCr of 0.74 mg/dL).   Assessment: 49 year old female presents to the ED with substernal chest pain that was relieved with nitropaste. EKG without acute ischemic changes or evidence of pericarditis. Initial troponin 300 and second trending up to 541. CTA obtained and did not see evidence of dissection or PE. No anticoagulation PTA. Continues on heparin drip   Heparin level this AM therapeutic, CBC stable   Goal of Therapy:  Heparin level 0.3-0.7 units/ml Monitor platelets by anticoagulation protocol: Yes   Plan:  Continue heparin at 1050 units / hr Daily heparin level and CBC  Follow up after cath?   Thank you,  51, PharmD  Please check amion for clinical pharmacist contact number

## 2020-02-07 NOTE — H&P (View-Only) (Signed)
 Progress Note  Patient Name: Vanessa Case Date of Encounter: 02/07/2020  Primary Cardiologist: Bridgette Christopher, MD   Subjective   Had brief chest at 7:15 am this morning, now comfortable. Awaiting cath this afternoon.  Inpatient Medications    Scheduled Meds: . aspirin EC  81 mg Oral Daily  . atorvastatin  80 mg Oral Daily  . dicyclomine  10 mg Oral TID AC & HS  . FLUoxetine  40 mg Oral Daily  . gabapentin  300 mg Oral BID  . insulin aspart  0-9 Units Subcutaneous TID WC  . pantoprazole  40 mg Oral Daily  . prazosin  1 mg Oral QHS  . QUEtiapine  50 mg Oral QHS  . sodium chloride flush  3 mL Intravenous Q12H  . sodium chloride flush  3 mL Intravenous Q12H  . traZODone  50 mg Oral QHS   Continuous Infusions: . sodium chloride    . sodium chloride    . sodium chloride 1 mL/kg/hr (02/07/20 0500)  . heparin 1,050 Units/hr (02/07/20 0908)  . nitroGLYCERIN 15 mcg/min (02/07/20 0725)   PRN Meds: sodium chloride, sodium chloride, budesonide, hydrOXYzine, nitroGLYCERIN, nitroGLYCERIN, ondansetron (ZOFRAN) IV, sodium chloride flush, sodium chloride flush, traMADol   Vital Signs    Vitals:   02/07/20 0511 02/07/20 0600 02/07/20 0723 02/07/20 0729  BP: 122/76  (!) 156/89 127/85  Pulse: 94 91 (!) 104 98  Resp: 17 (!) 21 (!) 21 20  Temp: 98.2 F (36.8 C)  98.1 F (36.7 C)   TempSrc: Oral  Oral   SpO2: 100% 99% 97% 99%  Weight: 73.2 kg     Height:        Intake/Output Summary (Last 24 hours) at 02/07/2020 1102 Last data filed at 02/07/2020 0600 Gross per 24 hour  Intake 627.6 ml  Output --  Net 627.6 ml   Last 3 Weights 02/07/2020 02/06/2020 02/05/2020  Weight (lbs) 161 lb 4.8 oz 159 lb 3.2 oz 166 lb 7.2 oz  Weight (kg) 73.165 kg 72.213 kg 75.5 kg  Some encounter information is confidential and restricted. Go to Review Flowsheets activity to see all data.      Telemetry    Sinus rhythm with occasional sinus tachycardia - Personally Reviewed  ECG     Sinus tachycardia - Personally Reviewed  Physical Exam   GEN: Well nourished, well developed in no acute distress HEENT: Normal, moist mucous membranes NECK: No JVD CARDIAC: regular rhythm, normal S1 and S2, no rubs or gallops. No murmur. VASCULAR: Radial and DP pulses 2+ bilaterally. No carotid bruits RESPIRATORY:  Clear to auscultation without rales, wheezing or rhonchi  ABDOMEN: Soft, non-tender, non-distended MUSCULOSKELETAL:  Ambulates independently SKIN: Warm and dry, no edema NEUROLOGIC:  Alert and oriented x 3. No focal neuro deficits noted. PSYCHIATRIC:  Normal affect   Labs    High Sensitivity Troponin:   Recent Labs  Lab 02/05/20 0405 02/05/20 0555 02/06/20 0737  TROPONINIHS 324* 541* 686*      Chemistry Recent Labs  Lab 02/05/20 0405 02/05/20 1211 02/06/20 0250  NA 137  --  137  K 3.7  --  4.1  CL 106  --  106  CO2 21*  --  20*  GLUCOSE 225*  --  198*  BUN 7  --  11  CREATININE 0.67  --  0.74  CALCIUM 9.8  --  10.2  PROT  --  6.9  --   ALBUMIN  --  3.8  --     AST  --  18  --   ALT  --  10  --   ALKPHOS  --  62  --   BILITOT  --  0.7  --   GFRNONAA >60  --  >60  GFRAA >60  --  >60  ANIONGAP 10  --  11     Hematology Recent Labs  Lab 02/05/20 0405 02/06/20 0250 02/07/20 0303  WBC 8.1 7.8 7.2  RBC 4.26 4.28 3.97  HGB 12.4 12.3 11.6*  HCT 37.7 37.3 35.3*  MCV 88.5 87.1 88.9  MCH 29.1 28.7 29.2  MCHC 32.9 33.0 32.9  RDW 12.8 12.8 12.8  PLT 236 237 213    BNPNo results for input(s): BNP, PROBNP in the last 168 hours.   DDimer No results for input(s): DDIMER in the last 168 hours.   Radiology    ECHOCARDIOGRAM COMPLETE  Result Date: 02/06/2020    ECHOCARDIOGRAM REPORT   Patient Name:   [name] Date of Exam: 02/06/2020 Medical Rec #:  [id number]             Height:       65.0 in Accession #:    [id number]            Weight:       159.2 lb Date of Birth:  Aug 02, 1971            BSA:          1.795 m Patient Age:    49 years               BP:           120/74 mmHg Patient Gender: F                     HR:           103 bpm. Exam Location:  Inpatient Procedure: 2D Echo, Cardiac Doppler and Color Doppler Indications:     121-121.4 ST elevation (STEMI) and non-ST elevation (NSTEMI)                  nyocardial infarction  History:         Patient has no prior history of Echocardiogram examinations.                  Chest pain 7 out of 10 during exam. RN at bedside.  Sonographer:     Merrie Roof RDCS Referring Phys:  [place] Diagnosing Phys: Oswaldo Milian MD IMPRESSIONS  1. Left ventricular ejection fraction, by estimation, is 55 to 60%. The left ventricle has normal function. There is mild left ventricular hypertrophy. Left ventricular diastolic parameters are indeterminate.  2. Right ventricular systolic function is normal. The right ventricular size is normal. Tricuspid regurgitation signal is inadequate for assessing PA pressure.  3. Left atrial size was mildly dilated.  4. The mitral valve is normal in structure. No evidence of mitral valve regurgitation.  5. The aortic valve is tricuspid. Aortic valve regurgitation is not visualized. Mild to moderate aortic valve sclerosis/calcification is present, without any evidence of aortic stenosis.  6. The inferior vena cava is normal in size with greater than 50% respiratory variability, suggesting right atrial pressure of 3 mmHg. FINDINGS  Left Ventricle: Left ventricular ejection fraction, by estimation, is 55 to 60%. The left ventricle has normal function. The left ventricle has no regional wall motion abnormalities. The left ventricular internal cavity size was normal in size.  There is  mild left ventricular hypertrophy. Left ventricular diastolic parameters are indeterminate. Right Ventricle: The right ventricular size is normal. No increase in right ventricular wall thickness. Right ventricular systolic function is normal. Tricuspid regurgitation signal is inadequate for  assessing PA pressure. Left Atrium: Left atrial size was mildly dilated. Right Atrium: Right atrial size was normal in size. Pericardium: Trivial pericardial effusion is present. Mitral Valve: The mitral valve is normal in structure. No evidence of mitral valve regurgitation. Tricuspid Valve: The tricuspid valve is normal in structure. Tricuspid valve regurgitation is not demonstrated. Aortic Valve: The aortic valve is tricuspid. Aortic valve regurgitation is not visualized. Mild to moderate aortic valve sclerosis/calcification is present, without any evidence of aortic stenosis. Pulmonic Valve: The pulmonic valve was not well visualized. Pulmonic valve regurgitation is not visualized. Aorta: The aortic root is normal in size and structure. Venous: The inferior vena cava is normal in size with greater than 50% respiratory variability, suggesting right atrial pressure of 3 mmHg. IAS/Shunts: The interatrial septum was not well visualized.  LEFT VENTRICLE PLAX 2D LVIDd:         3.60 cm     Diastology LVIDs:         2.70 cm     LV e' lateral: 5.98 cm/s LV PW:         1.10 cm     LV e' medial:  4.90 cm/s LV IVS:        1.10 cm LVOT diam:     1.70 cm LV SV:         38 LV SV Index:   21 LVOT Area:     2.27 cm  LV Volumes (MOD) LV vol d, MOD A4C: 74.8 ml LV vol s, MOD A4C: 35.3 ml LV SV MOD A4C:     74.8 ml RIGHT VENTRICLE             IVC RV Basal diam:  2.70 cm     IVC diam: 1.80 cm RV S prime:     21.60 cm/s TAPSE (M-mode): 2.4 cm LEFT ATRIUM           Index       RIGHT ATRIUM           Index LA diam:      3.80 cm 2.12 cm/m  RA Area:     11.00 cm LA Vol (A2C): 65.9 ml 36.71 ml/m RA Volume:   22.40 ml  12.48 ml/m LA Vol (A4C): 29.5 ml 16.43 ml/m  AORTIC VALVE LVOT Vmax:   95.80 cm/s LVOT Vmean:  56.600 cm/s LVOT VTI:    0.169 m  AORTA Ao Root diam: 2.80 cm  SHUNTS Systemic VTI:  0.17 m Systemic Diam: 1.70 cm Epifanio Lesches MD Electronically signed by Epifanio Lesches MD Signature Date/Time:  02/06/2020/6:05:52 PM    Final (Updated)     Cardiac Studies   Echo 02/06/2020  1. Left ventricular ejection fraction, by estimation, is 55 to 60%. The  left ventricle has normal function. There is mild left ventricular  hypertrophy. Left ventricular diastolic parameters are indeterminate.  2. Right ventricular systolic function is normal. The right ventricular  size is normal. Tricuspid regurgitation signal is inadequate for assessing  PA pressure.  3. Left atrial size was mildly dilated.  4. The mitral valve is normal in structure. No evidence of mitral valve  regurgitation.  5. The aortic valve is tricuspid. Aortic valve regurgitation is not  visualized. Mild to moderate aortic valve sclerosis/calcification  is  present, without any evidence of aortic stenosis.  6. The inferior vena cava is normal in size with greater than 50%  respiratory variability, suggesting right atrial pressure of 3 mmHg.   Patient Profile     49 y.o. female with DM, anxiety/depression, episodic cocaine use, borderline personality disorder, anemia, chronic pain, fibromyalgia, PTSD, HTN, HLD, neuropathy who presented to [facility] with chest pain. Found to have NSTEMI, complicated by recent cocaine use.  Assessment & Plan    NSTEMI, complicated by positive cocaine: -heparin drip until cath -nitro paste in place for chest pain. Will add imdur and change nitro paste to PRN -echo shows preserved LVEF 55-60%, no regional WMA -avoiding beta blocker given recent cocaine use. -given her comorbid conditions, I recommend cath on 4/19 to evaluate anatomy. We discussed diagnostic cath and PCI, she is amenable. -Risks and benefits of cardiac catheterization have been discussed with the patient.  These include bleeding, infection, kidney damage, stroke, heart attack, death.  The patient understands these risks and is willing to proceed. -increased atorvastatin to 80 mg -stop NSAIDs -hsTn trend 324 -> 541  -> 686 - HRs remain in 200', I will start cardizem CD 120 mg po daily  Substance abuse: -she endorses recent cocaine use, UDS positive -she is agreeable to no future cocaine use. We discussed how this impacts the heart at length  Hyperlipidemia, mized:  -LDL 101 this admission, goal LDL <70 -increased atorvastatin to 80 mg daily as above -TG elevated to 201  Type II diabetes -restart ozempic (GLP1RA) as an outpatient -insulin with sliding scale while admitted -A1c 9.2 this admission  Hypertension: BP has ranged this admission, 105/86-159/93 -has not yet gotten AM meds. Holding lisinopril to allow for nitro. Add if able.  Anxiety, depression, fibromyalgia, bipolar disorder: -stable. Continue home regimen, except that we will hold NSAIDs  For questions or updates, please contact [facility] Please consult [email or web address] for contact info under    Signed, Tobias Alexander, MD  02/07/2020, 11:02 AM

## 2020-02-07 NOTE — Progress Notes (Signed)
The chaplain responded to a consult for prayer and an AD. The patient expressed guilt about her lifestyle and wanted to talk to somebody about it. The patient expressed shame concerning her addiction and felt that telling her family would only bring about more shame. The chaplain provided supportive presence and prayer. The chaplain also provided AD paperwork for the patient. The chaplain is available if a follow-up is requested.  Lavone Neri Chaplain Resident For questions concerning this note please contact me by pager [phone]

## 2020-02-07 NOTE — Progress Notes (Signed)
Pt began having chest pain. Vitals 156/89 HR 107. Pt describes pain as throbbing midsternal. Nitro adjusted to 54mcg/min. Repeat vitals obtained. Pt states pain is subsiding after increase in nitro. Will continue to monitor.  Lacy Duverney, RN

## 2020-02-08 DIAGNOSIS — F1910 Other psychoactive substance abuse, uncomplicated: Secondary | ICD-10-CM

## 2020-02-08 DIAGNOSIS — E782 Mixed hyperlipidemia: Secondary | ICD-10-CM | POA: Diagnosis not present

## 2020-02-08 DIAGNOSIS — I214 Non-ST elevation (NSTEMI) myocardial infarction: Secondary | ICD-10-CM | POA: Diagnosis not present

## 2020-02-08 DIAGNOSIS — I10 Essential (primary) hypertension: Secondary | ICD-10-CM | POA: Diagnosis not present

## 2020-02-08 LAB — CBC WITH DIFFERENTIAL/PLATELET
Abs Immature Granulocytes: 0.03 10*3/uL (ref 0.00–0.07)
Basophils Absolute: 0 10*3/uL (ref 0.0–0.1)
Basophils Relative: 0 %
Eosinophils Absolute: 0.1 10*3/uL (ref 0.0–0.5)
Eosinophils Relative: 1 %
HCT: 35.4 % — ABNORMAL LOW (ref 36.0–46.0)
Hemoglobin: 11.5 g/dL — ABNORMAL LOW (ref 12.0–15.0)
Immature Granulocytes: 0 %
Lymphocytes Relative: 31 %
Lymphs Abs: 2.2 10*3/uL (ref 0.7–4.0)
MCH: 28.8 pg (ref 26.0–34.0)
MCHC: 32.5 g/dL (ref 30.0–36.0)
MCV: 88.5 fL (ref 80.0–100.0)
Monocytes Absolute: 0.4 10*3/uL (ref 0.1–1.0)
Monocytes Relative: 6 %
Neutro Abs: 4.5 10*3/uL (ref 1.7–7.7)
Neutrophils Relative %: 62 %
Platelets: 210 10*3/uL (ref 150–400)
RBC: 4 MIL/uL (ref 3.87–5.11)
RDW: 12.9 % (ref 11.5–15.5)
WBC: 7.3 10*3/uL (ref 4.0–10.5)
nRBC: 0 % (ref 0.0–0.2)

## 2020-02-08 LAB — BASIC METABOLIC PANEL
Anion gap: 8 (ref 5–15)
BUN: 7 mg/dL (ref 6–20)
CO2: 18 mmol/L — ABNORMAL LOW (ref 22–32)
Calcium: 10.2 mg/dL (ref 8.9–10.3)
Chloride: 111 mmol/L (ref 98–111)
Creatinine, Ser: 0.66 mg/dL (ref 0.44–1.00)
GFR calc Af Amer: >60 mL/min (ref >60)
GFR calc non Af Amer: >60 mL/min (ref >60)
Glucose, Bld: 234 mg/dL — ABNORMAL HIGH (ref 70–99)
Potassium: 4.2 mmol/L (ref 3.5–5.1)
Sodium: 137 mmol/L (ref 135–145)

## 2020-02-08 LAB — GLUCOSE, CAPILLARY
Glucose-Capillary: 215 mg/dL — ABNORMAL HIGH (ref 70–99)
Glucose-Capillary: 201 mg/dL — ABNORMAL HIGH (ref 70–99)

## 2020-02-08 MED ORDER — ANGIOPLASTY BOOK
Freq: Once | Status: AC
  Filled 2020-02-08: qty 1

## 2020-02-08 MED ORDER — HEART ATTACK BOUNCING BOOK
Freq: Once | Status: AC
  Filled 2020-02-08: qty 1

## 2020-02-08 MED ORDER — NITROGLYCERIN 0.4 MG SL SUBL: 0.4000 mg | SUBLINGUAL_TABLET | SUBLINGUAL | 2 refills | Status: DC | PRN

## 2020-02-08 MED ORDER — THE SENSUOUS HEART BOOK
Freq: Once | Status: AC
  Filled 2020-02-08: qty 1

## 2020-02-08 MED ORDER — ASPIRIN 81 MG PO TBEC: 81.0000 mg | DELAYED_RELEASE_TABLET | Freq: Every day | ORAL | 2 refills | Status: AC

## 2020-02-08 MED ORDER — DILTIAZEM HCL ER COATED BEADS 120 MG PO CP24
120.0000 mg | ORAL_CAPSULE | Freq: Every day | ORAL | Status: DC
  Administered 2020-02-08: 120 mg via ORAL
  Filled 2020-02-08: qty 1

## 2020-02-08 MED ORDER — TICAGRELOR 90 MG PO TABS: 90.0000 mg | ORAL_TABLET | Freq: Two times a day (BID) | ORAL | 1 refills | Status: DC

## 2020-02-08 MED ORDER — TICAGRELOR 90 MG PO TABS
90.0000 mg | ORAL_TABLET | Freq: Two times a day (BID) | ORAL | Status: DC
  Administered 2020-02-08 (×2): 90 mg via ORAL
  Filled 2020-02-08 (×2): qty 1

## 2020-02-08 MED ORDER — DILTIAZEM HCL ER COATED BEADS 120 MG PO CP24: 120.0000 mg | ORAL_CAPSULE | Freq: Every day | ORAL | 1 refills | Status: DC

## 2020-02-08 MED ORDER — ATORVASTATIN CALCIUM 80 MG PO TABS: 80.0000 mg | ORAL_TABLET | Freq: Every day | ORAL | 1 refills | Status: DC

## 2020-02-08 MED FILL — ASPIRIN LOW DOSE 81 MG TBEC: 81 | 30 days supply | Qty: 30 | Fill #0

## 2020-02-08 MED FILL — BRILINTA 90 MG TABLET: 90 | 30 days supply | Qty: 60 | Fill #0

## 2020-02-08 MED FILL — NITROGLYCERIN 0.4 MG TAB SL: 0.4 | 8 days supply | Qty: 25 | Fill #0

## 2020-02-08 MED FILL — ATORVASTATIN CALCIUM 80 MG: 80 | 30 days supply | Qty: 30 | Fill #0

## 2020-02-08 MED FILL — CARTIA XT 120 MG CP24: 120 | 30 days supply | Qty: 30 | Fill #0

## 2020-02-08 NOTE — Progress Notes (Signed)
Eligibility check for [facility] submitted and  pending

## 2020-02-08 NOTE — Progress Notes (Addendum)
Progress Note  Patient Name: Vanessa Case Date of Encounter: 02/08/2020  Primary Cardiologist: Jodelle Red, MD   Subjective   She is asymptomatic. No chest pain since the cath. No bleeding or pain in the right radial access site.  Inpatient Medications    Scheduled Meds: . aspirin EC  81 mg Oral Daily  . atorvastatin  80 mg Oral Daily  . dicyclomine  10 mg Oral TID AC & HS  . FLUoxetine  40 mg Oral Daily  . gabapentin  300 mg Oral BID  . insulin aspart  0-9 Units Subcutaneous TID WC  . pantoprazole  40 mg Oral Daily  . prazosin  1 mg Oral QHS  . QUEtiapine  50 mg Oral QHS  . sodium chloride flush  3 mL Intravenous Q12H  . ticagrelor  90 mg Oral BID  . traZODone  50 mg Oral QHS   Continuous Infusions: . sodium chloride     PRN Meds: sodium chloride, budesonide, hydrOXYzine, nitroGLYCERIN, ondansetron (ZOFRAN) IV, sodium chloride flush, traMADol   Vital Signs    Vitals:   02/07/20 2127 02/08/20 0549 02/08/20 0800 02/08/20 0824  BP: 138/89 131/73  (!) 132/93  Pulse: 95 87 90   Resp: 17 19 20 11   Temp: 98.2 F (36.8 C) 98.5 F (36.9 C)    TempSrc: Oral Oral    SpO2: 100% 100% 100%   Weight:  72.7 kg    Height:        Intake/Output Summary (Last 24 hours) at 02/08/2020 1223 Last data filed at 02/08/2020 0900 Gross per 24 hour  Intake 120 ml  Output --  Net 120 ml   Last 3 Weights 02/08/2020 02/07/2020 02/06/2020  Weight (lbs) 160 lb 4.8 oz 161 lb 4.8 oz 159 lb 3.2 oz  Weight (kg) 72.712 kg 73.165 kg 72.213 kg  Some encounter information is confidential and restricted. Go to Review Flowsheets activity to see all data.      Telemetry    Sinus rhythm with occasional sinus tachycardia - Personally Reviewed  ECG    Sinus tachycardia - Personally Reviewed  Physical Exam   GEN: Well nourished, well developed in no acute distress HEENT: Normal, moist mucous membranes NECK: No JVD CARDIAC: regular rhythm, normal S1 and S2, no rubs or  gallops. No murmur. VASCULAR: Radial and DP pulses 2+ bilaterally. No carotid bruits RESPIRATORY:  Clear to auscultation without rales, wheezing or rhonchi  ABDOMEN: Soft, non-tender, non-distended MUSCULOSKELETAL:  Ambulates independently SKIN: Warm and dry, no edema NEUROLOGIC:  Alert and oriented x 3. No focal neuro deficits noted. PSYCHIATRIC:  Normal affect   Labs    High Sensitivity Troponin:   Recent Labs  Lab 02/05/20 0405 02/05/20 0555 02/06/20 0737  TROPONINIHS 324* 541* 686*      Chemistry Recent Labs  Lab 02/05/20 0405 02/05/20 1211 02/06/20 0250 02/08/20 0443  NA 137  --  137 137  K 3.7  --  4.1 4.2  CL 106  --  106 111  CO2 21*  --  20* 18*  GLUCOSE 225*  --  198* 234*  BUN 7  --  11 7  CREATININE 0.67  --  0.74 0.66  CALCIUM 9.8  --  10.2 10.2  PROT  --  6.9  --   --   ALBUMIN  --  3.8  --   --   AST  --  18  --   --   ALT  --  10  --   --  ALKPHOS  --  62  --   --   BILITOT  --  0.7  --   --   GFRNONAA >60  --  >60 >60  GFRAA >60  --  >60 >60  ANIONGAP 10  --  11 8     Hematology Recent Labs  Lab 02/06/20 0250 02/07/20 0303 02/08/20 0443  WBC 7.8 7.2 7.3  RBC 4.28 3.97 4.00  HGB 12.3 11.6* 11.5*  HCT 37.3 35.3* 35.4*  MCV 87.1 88.9 88.5  MCH 28.7 29.2 28.8  MCHC 33.0 32.9 32.5  RDW 12.8 12.8 12.9  PLT 237 213 210    BNPNo results for input(s): BNP, PROBNP in the last 168 hours.   DDimer No results for input(s): DDIMER in the last 168 hours.   Radiology    CARDIAC CATHETERIZATION  Result Date: 02/07/2020  CULPRIT LESION: Prox Cx to Mid Cx lesion is 99% stenosed.  A drug-eluting stent was successfully placed using a STENT RESOLUTE ONYX 2.5X12. - post-dilated to 2.7 mm  Post intervention, there is a 0% residual stenosis.  ---------------------  Prox LAD to Mid LAD lesion is 20% stenosed with 30% stenosed side branch in 1st Diag.  Dist LAD lesion is 60% stenosed. Focal Lesion - recommend Med Rx unless Sx progress.  Mid Cx  lesion is 20% stenosed.  ---------------------  LV end diastolic pressure is normal.  SUMMARY  Severe Single Vessel CAD - CULPRIT LESION mLCx 99% subtotalled / thrombotic stenosis  Successful DES PCI of mCx - Resolute Onyx DES 2.5 mm x 12 mm (2.7 mm)  Moderate m-distal LAD focal 60% in bend.  Otherwise minmal CAD elsewhere.  Normal LVEDP   ECHOCARDIOGRAM COMPLETE  Result Date: 02/06/2020    ECHOCARDIOGRAM REPORT   Patient Name:   Vanessa Case [facility] Date of Exam: 02/06/2020 Medical Rec #:  [id number]             Height:       65.0 in Accession #:    [id number]            Weight:       159.2 lb Date of Birth:  December 03, 1970            BSA:          1.795 m Patient Age:    49 years              BP:           120/74 mmHg Patient Gender: F                     HR:           103 bpm. Exam Location:  Inpatient Procedure: 2D Echo, Cardiac Doppler and Color Doppler Indications:     121-121.4 ST elevation (STEMI) and non-ST elevation (NSTEMI)                  nyocardial infarction  History:         Patient has no prior history of Echocardiogram examinations.                  Chest pain 7 out of 10 during exam. RN at bedside.  Sonographer:     Merrie Roof RDCS Referring Phys:  Cherry Log Diagnosing Phys: Oswaldo Milian MD IMPRESSIONS  1. Left ventricular ejection fraction, by estimation, is 55 to 60%. The left ventricle has normal function. There is mild left ventricular hypertrophy. Left ventricular  diastolic parameters are indeterminate.  2. Right ventricular systolic function is normal. The right ventricular size is normal. Tricuspid regurgitation signal is inadequate for assessing PA pressure.  3. Left atrial size was mildly dilated.  4. The mitral valve is normal in structure. No evidence of mitral valve regurgitation.  5. The aortic valve is tricuspid. Aortic valve regurgitation is not visualized. Mild to moderate aortic valve sclerosis/calcification is present, without any evidence of aortic  stenosis.  6. The inferior vena cava is normal in size with greater than 50% respiratory variability, suggesting right atrial pressure of 3 mmHg. FINDINGS  Left Ventricle: Left ventricular ejection fraction, by estimation, is 55 to 60%. The left ventricle has normal function. The left ventricle has no regional wall motion abnormalities. The left ventricular internal cavity size was normal in size. There is  mild left ventricular hypertrophy. Left ventricular diastolic parameters are indeterminate. Right Ventricle: The right ventricular size is normal. No increase in right ventricular wall thickness. Right ventricular systolic function is normal. Tricuspid regurgitation signal is inadequate for assessing PA pressure. Left Atrium: Left atrial size was mildly dilated. Right Atrium: Right atrial size was normal in size. Pericardium: Trivial pericardial effusion is present. Mitral Valve: The mitral valve is normal in structure. No evidence of mitral valve regurgitation. Tricuspid Valve: The tricuspid valve is normal in structure. Tricuspid valve regurgitation is not demonstrated. Aortic Valve: The aortic valve is tricuspid. Aortic valve regurgitation is not visualized. Mild to moderate aortic valve sclerosis/calcification is present, without any evidence of aortic stenosis. Pulmonic Valve: The pulmonic valve was not well visualized. Pulmonic valve regurgitation is not visualized. Aorta: The aortic root is normal in size and structure. Venous: The inferior vena cava is normal in size with greater than 50% respiratory variability, suggesting right atrial pressure of 3 mmHg. IAS/Shunts: The interatrial septum was not well visualized.  LEFT VENTRICLE PLAX 2D LVIDd:         3.60 cm     Diastology LVIDs:         2.70 cm     LV e' lateral: 5.98 cm/s LV PW:         1.10 cm     LV e' medial:  4.90 cm/s LV IVS:        1.10 cm LVOT diam:     1.70 cm LV SV:         38 LV SV Index:   21 LVOT Area:     2.27 cm  LV Volumes (MOD) LV  vol d, MOD A4C: 74.8 ml LV vol s, MOD A4C: 35.3 ml LV SV MOD A4C:     74.8 ml RIGHT VENTRICLE             IVC RV Basal diam:  2.70 cm     IVC diam: 1.80 cm RV S prime:     21.60 cm/s TAPSE (M-mode): 2.4 cm LEFT ATRIUM           Index       RIGHT ATRIUM           Index LA diam:      3.80 cm 2.12 cm/m  RA Area:     11.00 cm LA Vol (A2C): 65.9 ml 36.71 ml/m RA Volume:   22.40 ml  12.48 ml/m LA Vol (A4C): 29.5 ml 16.43 ml/m  AORTIC VALVE LVOT Vmax:   95.80 cm/s LVOT Vmean:  56.600 cm/s LVOT VTI:    0.169 m  AORTA Ao Root diam: 2.80 cm  SHUNTS Systemic VTI:  0.17 m Systemic Diam: 1.70 cm Epifanio Lesches MD Electronically signed by Epifanio Lesches MD Signature Date/Time: 02/06/2020/6:05:52 PM    Final (Updated)     Cardiac Studies   Echo 02/06/2020  1. Left ventricular ejection fraction, by estimation, is 55 to 60%. The  left ventricle has normal function. There is mild left ventricular  hypertrophy. Left ventricular diastolic parameters are indeterminate.  2. Right ventricular systolic function is normal. The right ventricular  size is normal. Tricuspid regurgitation signal is inadequate for assessing  PA pressure.  3. Left atrial size was mildly dilated.  4. The mitral valve is normal in structure. No evidence of mitral valve  regurgitation.  5. The aortic valve is tricuspid. Aortic valve regurgitation is not  visualized. Mild to moderate aortic valve sclerosis/calcification is  present, without any evidence of aortic stenosis.  6. The inferior vena cava is normal in size with greater than 50%  respiratory variability, suggesting right atrial pressure of 3 mmHg.   Patient Profile     49 y.o. female with DM, anxiety/depression, episodic cocaine use, borderline personality disorder, anemia, chronic pain, fibromyalgia, PTSD, HTN, HLD, neuropathy who presented to [facility] with chest pain. Found to have NSTEMI, complicated by recent cocaine use.  Assessment & Plan      NSTEMI, complicated by positive cocaine: - cath yesterday: Severe Single Vessel CAD - CULPRIT LESION mLCx 99% subtotalled / thrombotic stenosis  Successful DES PCI of mCx - Resolute Onyx DES 2.5 mm x 12 mm (2.7 mm)  Moderate m-distal LAD focal 60% in bend.  Otherwise minmal CAD elsewhere.  Normal LVEDP  We will continue ASA, Brilinta for 1 year, high dose atorvastatin  -echo shows preserved LVEF 55-60%, no regional WMA -avoiding beta blocker given recent cocaine use. -given her comorbid conditions, I recommend cath on 4/19 to evaluate anatomy. We discussed diagnostic cath and PCI, she is amenable. -hsTn trend 324 -> 541 -> 686 - HRs remain in 90', we will start cardizem CD 120 mg po daily   Substance abuse: -she endorses recent cocaine use, UDS positive, she is motivated to quit -she is agreeable to no future cocaine use. We discussed how this impacts the heart at length  Hyperlipidemia, mized:  -LDL 101 this admission, goal LDL <70 -increased atorvastatin to 80 mg daily as above -TG elevated to 201  Type II diabetes -restart ozempic (GLP1RA) as an outpatient -insulin with sliding scale while admitted -A1c 9.2 this admission  Hypertension: BP has ranged this admission, 105/86-159/93 -we will  Add Cardizem CD to 120 mg po daily  Anxiety, depression, fibromyalgia, bipolar disorder: -stable. Continue home regimen, except that we will hold NSAIDs  We will discharge today and arrange for an outpatient follow up within 1 week.   For questions or updates, please contact [facility] Please consult [email or web address] for contact info under   Signed, Tobias Alexander, MD  02/08/2020, 12:23 PM

## 2020-02-08 NOTE — Care Management (Signed)
1027 02-08-20 Case Manager provided patient with outpatient substance abuse information. Case Manager also provided patient with Brilinta discount card. Case Manager will continue to follow for additional transition of care needs. Gala Lewandowsky, RN,BSN Case Manager [phone]

## 2020-02-08 NOTE — Discharge Summary (Signed)
Discharge Summary    Patient ID: Vanessa Case,  MRN: [id number], DOB/AGE: 01/05/1971 49 y.o.  Admit date: 02/05/2020 Discharge date: 02/08/2020  Primary Care Provider: [facility] Primary Cardiologist: Jodelle Red, MD  Discharge Diagnoses    Principal Problem:   NSTEMI (non-ST elevated myocardial infarction) [facility]) Active Problems:   Type 2 diabetes mellitus with diabetic polyneuropathy (HCC)   Essential hypertension   HLD (hyperlipidemia)   Substance abuse (HCC)  Allergies Allergies  Allergen Reactions  . Percocet [Oxycodone-Acetaminophen] Hives, Itching, Palpitations and Other (See Comments)    nightmares  . Ambien [Zolpidem] Other (See Comments)    Sleepwalking episodes    Diagnostic Studies/Procedures    Cath: 02/07/20   CULPRIT LESION: Prox Cx to Mid Cx lesion is 99% stenosed.  A drug-eluting stent was successfully placed using a STENT RESOLUTE ONYX 2.5X12. - post-dilated to 2.7 mm  Post intervention, there is a 0% residual stenosis.  ---------------------  Prox LAD to Mid LAD lesion is 20% stenosed with 30% stenosed side branch in 1st Diag.  Dist LAD lesion is 60% stenosed. Focal Lesion - recommend Med Rx unless Sx progress.  Mid Cx lesion is 20% stenosed.  ---------------------  LV end diastolic pressure is normal.   SUMMARY  Severe Single Vessel CAD - CULPRIT LESION mLCx 99% subtotalled / thrombotic stenosis  Successful DES PCI of mCx - Resolute Onyx DES 2.5 mm x 12 mm (2.7 mm)  Moderate m-distal LAD focal 60% in bend.  Otherwise minmal CAD elsewhere.  Normal LVEDP  Diagnostic Dominance: Right  Intervention   Echo: 02/06/20  IMPRESSIONS    1. Left ventricular ejection fraction, by estimation, is 55 to 60%. The  left ventricle has normal function. There is mild left ventricular  hypertrophy. Left ventricular diastolic parameters are indeterminate.  2. Right ventricular systolic function is normal.  The right ventricular  size is normal. Tricuspid regurgitation signal is inadequate for assessing  PA pressure.  3. Left atrial size was mildly dilated.  4. The mitral valve is normal in structure. No evidence of mitral valve  regurgitation.  5. The aortic valve is tricuspid. Aortic valve regurgitation is not  visualized. Mild to moderate aortic valve sclerosis/calcification is  present, without any evidence of aortic stenosis.  6. The inferior vena cava is normal in size with greater than 50%  respiratory variability, suggesting right atrial pressure of 3 mmHg.  _____________   History of Present Illness     Vanessa Case is a 49 y.o. female with DM, anxiety/depression, episodic cocaine use, borderline personality disorder, anemia, chronic pain, fibromyalgia, PTSD, HTN, HLD, neuropathy who presented to [facility] with chest pain.  She has no prior cardiac history. She shared that she has a history of above diagnoses including prior suicide attempt 6 years ago and maintained close continuity of care with [facility] for her mental health. She has a history of cocaine use and last used 2 days prior to admission. She also smokes marijuana periodically. About a week prior to admission, she recalled having an episode of chest pain out of the blue. She took 2 Meloxicam and it went away after an hour. She didn't think anything else of it. However, last night around midnight she was awakened by severe substernal chest pain with associated radiation to her neck, back and arms. She had associated vomiting and SOB. No diaphoresis. She took 2 Meloxicam with no relief. She took Advil PM but pain persisted. She isn't quite  sure on what time but eventually family called EMS. She said they arrived around 0100 but ED arrival time listed as 0337 so likely longer duration of symptoms prior to EMS activation. EMS gave her 4mg  Zofran, 324mg  ASA, SL NTG which greatly improved her pain. When she got  to the ED pain recurred so she was given GI cocktail, then a NTG paste which totally relieved her pain. VSS. Her HR was borderline elevated. CXR NAD. Labs show hsTroponins 324->541. UDS and Covid were pending. CT Angio was obtained which showed no dissection or acute abnormality, + aortic atherosclerosis, and ancillary findings as outlined below. EKG shows NSR 91bpm, nonspeific TW changes, no acute ST changes. EKG reads out as borderline QT interval but hand calculated at 394ms. She remains pain free at this time. Given her symptoms she was admitted and started on an IV heparin drip. Planned for cardiac cath this admission.    Hospital Course     1. NSTEMI, complicated by positive cocaine: she was continued on IV heparin until cath. Underwent cardiac cath with Dr. Herbie BaltimoreHarding noted above with severe single vessel CAD, culprit lesion being subtotal/thrombotic stenosis of 99% mLcx lesion. Had successful PCI/DES x1. hsTn peaked at 686. Placed on DAPT with ASA/Brilinta post cath for at least one year. Follow up echo showed preserved LVEF 55-60%, no regional WMA Plan to avoid beta blocker given recent cocaine use. Her home atorvastatin was increased from 20mg  to 80 mg daily. Worked well with cardiac rehab without recurrent chest pain.   2. Substance abuse: recent cocaine use, UDS positive. She is agreeable to no future cocaine use. It was discussed how this impacts the heart at length  3. Hyperlipidemia, mixed: LDL 101 this admission, goal LDL <70. Increased atorvastatin to 80 mg daily as above -- FLP/LFTs in 8 weeks  4. Type II diabetes: Hgb A1c 9.2. Was on SSI while admitted. Restarted on ozempic and metformin as an outpatient.   5. Hypertension: BP has ranged during this admission. She was started on Dilt 120mg  daily with improvement. No BB therapy as above with recent cocaine use.   Vanessa Case was seen by Dr. Delton SeeNelson and determined stable for discharge home. Follow up in the office has  been arranged. Medications are listed below.   _____________  Discharge Vitals Blood pressure 119/82, pulse 99, temperature 98.2 F (36.8 C), temperature source Oral, resp. rate 19, height 5\' 5"  (1.651 m), weight 72.7 kg, last menstrual period 10/07/2011, SpO2 100 %.  Filed Weights   02/06/20 0457 02/07/20 0511 02/08/20 0549  Weight: 72.2 kg 73.2 kg 72.7 kg    Labs & Radiologic Studies    CBC Recent Labs    02/07/20 0303 02/08/20 0443  WBC 7.2 7.3  NEUTROABS 3.9 4.5  HGB 11.6* 11.5*  HCT 35.3* 35.4*  MCV 88.9 88.5  PLT 213 210   Basic Metabolic Panel Recent Labs    16/07/9603/18/21 0250 02/08/20 0443  NA 137 137  K 4.1 4.2  CL 106 111  CO2 20* 18*  GLUCOSE 198* 234*  BUN 11 7  CREATININE 0.74 0.66  CALCIUM 10.2 10.2   Liver Function Tests No results for input(s): AST, ALT, ALKPHOS, BILITOT, PROT, ALBUMIN in the last 72 hours. No results for input(s): LIPASE, AMYLASE in the last 72 hours. Cardiac Enzymes No results for input(s): CKTOTAL, CKMB, CKMBINDEX, TROPONINI in the last 72 hours. BNP Invalid input(s): POCBNP D-Dimer No results for input(s): DDIMER in the last 72 hours. Hemoglobin A1C No  results for input(s): HGBA1C in the last 72 hours. Fasting Lipid Panel Recent Labs    02/06/20 0250  CHOL 181  HDL 40*  LDLCALC 101*  TRIG 201*  CHOLHDL 4.5   Thyroid Function Tests No results for input(s): TSH, T4TOTAL, T3FREE, THYROIDAB in the last 72 hours.  Invalid input(s): FREET3 _____________  DG Chest 2 View  Result Date: 02/05/2020 CLINICAL DATA:  Chest pain.  Nausea and vomiting. EXAM: CHEST - 2 VIEW COMPARISON:  05/14/2016 FINDINGS: Numerous leads and wires project over the chest. Midline trachea.  Normal heart size and mediastinal contours. Sharp costophrenic angles.  No pneumothorax.  Clear lungs. Patient rotated minimally left. IMPRESSION: No active cardiopulmonary disease. Electronically Signed   By: Jeronimo Greaves M.D.   On: 02/05/2020 04:54   CARDIAC  CATHETERIZATION  Result Date: 02/07/2020  CULPRIT LESION: Prox Cx to Mid Cx lesion is 99% stenosed.  A drug-eluting stent was successfully placed using a STENT RESOLUTE ONYX 2.5X12. - post-dilated to 2.7 mm  Post intervention, there is a 0% residual stenosis.  ---------------------  Prox LAD to Mid LAD lesion is 20% stenosed with 30% stenosed side branch in 1st Diag.  Dist LAD lesion is 60% stenosed. Focal Lesion - recommend Med Rx unless Sx progress.  Mid Cx lesion is 20% stenosed.  ---------------------  LV end diastolic pressure is normal.  SUMMARY  Severe Single Vessel CAD - CULPRIT LESION mLCx 99% subtotalled / thrombotic stenosis  Successful DES PCI of mCx - Resolute Onyx DES 2.5 mm x 12 mm (2.7 mm)  Moderate m-distal LAD focal 60% in bend.  Otherwise minmal CAD elsewhere.  Normal LVEDP   ECHOCARDIOGRAM COMPLETE  Result Date: 02/06/2020    ECHOCARDIOGRAM REPORT   Patient Name:   Vanessa Case [facility] Date of Exam: 02/06/2020 Medical Rec #:  [id number]             Height:       65.0 in Accession #:    [id number]            Weight:       159.2 lb Date of Birth:  1971/01/26            BSA:          1.795 m Patient Age:    48 years              BP:           120/74 mmHg Patient Gender: F                     HR:           103 bpm. Exam Location:  Inpatient Procedure: 2D Echo, Cardiac Doppler and Color Doppler Indications:     121-121.4 ST elevation (STEMI) and non-ST elevation (NSTEMI)                  nyocardial infarction  History:         Patient has no prior history of Echocardiogram examinations.                  Chest pain 7 out of 10 during exam. RN at bedside.  Sonographer:     Roosvelt Maser RDCS Referring Phys:  [id number] Tacey Ruiz DUNN Diagnosing Phys: Epifanio Lesches MD IMPRESSIONS  1. Left ventricular ejection fraction, by estimation, is 55 to 60%. The left ventricle has normal function. There is mild left ventricular hypertrophy. Left ventricular diastolic parameters are indeterminate.  2. Right ventricular systolic function is normal. The right ventricular size is normal. Tricuspid regurgitation signal is inadequate for assessing PA pressure.  3. Left atrial size was mildly dilated.  4. The mitral valve is normal in structure. No evidence of mitral valve regurgitation.  5. The aortic valve is tricuspid. Aortic valve regurgitation is not visualized. Mild to moderate aortic valve sclerosis/calcification is present, without any evidence of aortic stenosis.  6. The inferior vena cava is normal in size with greater than 50% respiratory variability, suggesting right atrial pressure of 3 mmHg. FINDINGS  Left Ventricle: Left ventricular ejection fraction, by estimation, is 55 to 60%. The left ventricle has normal function. The left ventricle has no regional wall motion abnormalities. The left ventricular internal cavity size was normal in size. There is  mild left ventricular hypertrophy. Left ventricular diastolic parameters are indeterminate. Right Ventricle: The right ventricular size is normal. No increase in right ventricular wall thickness. Right ventricular systolic function is normal. Tricuspid regurgitation signal is inadequate for assessing PA pressure. Left Atrium: Left atrial size was mildly dilated. Right Atrium: Right atrial size was normal in size. Pericardium: Trivial pericardial effusion is present. Mitral Valve: The mitral valve is normal in structure. No evidence of mitral valve regurgitation. Tricuspid Valve: The tricuspid valve is normal in structure. Tricuspid valve regurgitation is not demonstrated. Aortic Valve: The aortic valve is tricuspid. Aortic valve regurgitation is not visualized. Mild to moderate aortic valve sclerosis/calcification is present, without any evidence of aortic stenosis. Pulmonic Valve: The pulmonic valve was not well visualized. Pulmonic valve regurgitation is not visualized. Aorta: The aortic root is normal in size and structure. Venous: The inferior vena  cava is normal in size with greater than 50% respiratory variability, suggesting right atrial pressure of 3 mmHg. IAS/Shunts: The interatrial septum was not well visualized.  LEFT VENTRICLE PLAX 2D LVIDd:         3.60 cm     Diastology LVIDs:         2.70 cm     LV e' lateral: 5.98 cm/s LV PW:         1.10 cm     LV e' medial:  4.90 cm/s LV IVS:        1.10 cm LVOT diam:     1.70 cm LV SV:         38 LV SV Index:   21 LVOT Area:     2.27 cm  LV Volumes (MOD) LV vol d, MOD A4C: 74.8 ml LV vol s, MOD A4C: 35.3 ml LV SV MOD A4C:     74.8 ml RIGHT VENTRICLE             IVC RV Basal diam:  2.70 cm     IVC diam: 1.80 cm RV S prime:     21.60 cm/s TAPSE (M-mode): 2.4 cm LEFT ATRIUM           Index       RIGHT ATRIUM           Index LA diam:      3.80 cm 2.12 cm/m  RA Area:     11.00 cm LA Vol (A2C): 65.9 ml 36.71 ml/m RA Volume:   22.40 ml  12.48 ml/m LA Vol (A4C): 29.5 ml 16.43 ml/m  AORTIC VALVE LVOT Vmax:   95.80 cm/s LVOT Vmean:  56.600 cm/s LVOT VTI:    0.169 m  AORTA Ao Root diam: 2.80 cm  SHUNTS Systemic VTI:  0.17 m  Systemic Diam: 1.70 cm Epifanio Lesches MD Electronically signed by Epifanio Lesches MD Signature Date/Time: 02/06/2020/6:05:52 PM    Final (Updated)    CT Angio Chest/Abd/Pel for Dissection W and/or Wo Contrast  Result Date: 02/05/2020 CLINICAL DATA:  Chest pain radiating into back and jaw. Began last night. EXAM: CT ANGIOGRAPHY CHEST, ABDOMEN AND PELVIS TECHNIQUE: Multidetector CT imaging through the chest, abdomen and pelvis was performed using the standard protocol during bolus administration of intravenous contrast. Multiplanar reconstructed images and MIPs were obtained and reviewed to evaluate the vascular anatomy. CONTRAST:  OMNIPAQUE IOHEXOL 350 MG/ML SOLN COMPARISON:  Chest radiograph 02/05/2020. CT stone study 03/20/2015. CTA chest 09/20/2014. FINDINGS: CTA CHEST FINDINGS Cardiovascular: Aortic atherosclerosis. No aneurysm or dissection. Normal heart size, without  pericardial effusion. No central pulmonary embolism, on this non-dedicated study. Mediastinum/Nodes: No mediastinal or hilar adenopathy. Lungs/Pleura: No pleural fluid. No pneumothorax. Clear lungs. Musculoskeletal: No acute osseous abnormality. Review of the MIP images confirms the above findings. CTA ABDOMEN AND PELVIS FINDINGS VASCULAR Aorta: Normal aortic caliber, without dissection or aneurysm. Atherosclerotic irregularity within. Celiac: Widely patent. SMA: Widely patent. Renals: Widely patent. Accessory lower pole right renal artery. IMA: Patent Inflow: Normal in caliber. No significant stenosis. Veins: Poorly evaluated. Review of the MIP images confirms the above findings. NON-VASCULAR Hepatobiliary: Normal liver. Cholecystectomy, without biliary ductal dilatation. Pancreas: Normal, without mass or ductal dilatation. Spleen: Normal in size, without focal abnormality. Adrenals/Urinary Tract: Normal adrenal glands. Mildly scarred kidneys, without hydronephrosis. Subtle hypoattenuation in the lower pole left renal cortex on 158/7. Normal urinary bladder. Stomach/Bowel: Normal stomach, without wall thickening. Normal colon, appendix, and terminal ileum. Normal small bowel. Lymphatic: No abdominopelvic adenopathy. Reproductive: Hysterectomy. Left ovarian/adnexal fluid density mass is suboptimally evaluated, including at 4.8 x 4.0 cm on 235/7. Other: No significant free fluid.  No free intraperitoneal air. Musculoskeletal: No acute osseous abnormality. Review of the MIP images confirms the above findings. IMPRESSION: 1. No evidence of aortic dissection or aneurysm. 2. Subtle hypoattenuation in the lower pole left kidney cortex. Correlate with urinalysis to exclude pyelonephritis. 3. Left ovarian/adnexal mass. Given patient age, size, and fluid density this lesion, this is most likely a dominant cyst. If there are left lower quadrant symptoms, consider further evaluation with pelvic ultrasound. This recommendation  follows ACR consensus guidelines: White Paper of the ACR Incidental Findings Committee II on Adnexal Findings. J Am Coll Radiol 2013:10:675-681. 4. Aortic Atherosclerosis (ICD10-I70.0). Electronically Signed   By: Jeronimo Greaves M.D.   On: 02/05/2020 07:35   Disposition   Pt is being discharged home today in good condition.  Follow-up Plans & Appointments    Follow-up Information    Corrin Parker, PA-C Follow up on 02/18/2020.   Specialties: Physician Assistant, Cardiology Why: at 1:45pm for your follow up appt.  Contact information: [address] [place] [address] [phone]          Discharge Instructions    Diet - low sodium heart healthy   Complete by: As directed    Discharge instructions   Complete by: As directed    PLEASE DO NOT MISS ANY DOSES OF YOUR BRILINTA!!!!! Also keep a log of you blood pressures and bring back to your follow up appt. Please call the office with any questions.   Patients taking blood thinners should generally stay away from medicines like ibuprofen, Advil, Motrin, naproxen, and Aleve due to risk of stomach bleeding. You may take Tylenol as directed or talk to your primary doctor about  alternatives.   Increase activity slowly   Complete by: As directed        Discharge Medications     Medication List    STOP taking these medications   benzonatate 100 MG capsule Commonly known as: TESSALON   ergocalciferol 1.25 MG (50000 UT) capsule Commonly known as: VITAMIN D2   ibuprofen 800 MG tablet Commonly known as: ADVIL   Ibuprofen-diphenhydrAMINE Cit 200-38 MG Tabs   insulin detemir 100 UNIT/ML injection Commonly known as: LEVEMIR   Insulin Pen Needle 30G X 8 MM Misc Commonly known as: NOVOFINE   lisinopril 20 MG tablet Commonly known as: ZESTRIL   meloxicam 15 MG tablet Commonly known as: MOBIC     TAKE these medications   aspirin 81 MG EC tablet Take 1 tablet (81 mg total) by mouth daily. Start taking  on: February 09, 2020   atorvastatin 80 MG tablet Commonly known as: LIPITOR Take 1 tablet (80 mg total) by mouth daily. Start taking on: February 09, 2020 What changed:   medication strength  how much to take   dicyclomine 10 MG capsule Commonly known as: BENTYL Take 1 capsule (10 mg total) by mouth 4 (four) times daily -  before meals and at bedtime.   diltiazem 120 MG 24 hr capsule Commonly known as: CARDIZEM CD Take 1 capsule (120 mg total) by mouth daily.   FLUoxetine 40 MG capsule Commonly known as: PROZAC Take 40 mg by mouth daily.   fluticasone 44 MCG/ACT inhaler Commonly known as: FLOVENT HFA Inhale 2 puffs into the lungs 2 (two) times daily as needed (for shortness of breath).   gabapentin 400 MG capsule Commonly known as: NEURONTIN Take 1 capsule (400 mg total) by mouth 3 (three) times daily.   gabapentin 300 MG capsule Commonly known as: NEURONTIN Take 300 mg by mouth 2 (two) times daily.   hydrOXYzine 25 MG tablet Commonly known as: ATARAX/VISTARIL Take 25 mg by mouth at bedtime as needed for itching or anxiety. What changed: Another medication with the same name was removed. Continue taking this medication, and follow the directions you see here.   metFORMIN 500 MG tablet Commonly known as: GLUCOPHAGE Take 500 mg by mouth 2 (two) times daily. What changed: Another medication with the same name was removed. Continue taking this medication, and follow the directions you see here.   nitroGLYCERIN 0.4 MG SL tablet Commonly known as: NITROSTAT Place 1 tablet (0.4 mg total) under the tongue every 5 (five) minutes x 3 doses as needed for chest pain.   omeprazole 20 MG capsule Commonly known as: PRILOSEC Take 1 capsule (20 mg total) by mouth daily.   Ozempic (0.25 or 0.5 MG/DOSE) 2 MG/1.5ML Sopn Generic drug: Semaglutide(0.25 or 0.5MG /DOS) Inject 0.5 mg into the skin every Saturday.   prazosin 1 MG capsule Commonly known as: MINIPRESS Take 1 capsule (1 mg  total) by mouth at bedtime. To prevent nightmares   QUEtiapine 50 MG tablet Commonly known as: SEROQUEL Take 50 mg by mouth at bedtime.   ticagrelor 90 MG Tabs tablet Commonly known as: BRILINTA Take 1 tablet (90 mg total) by mouth 2 (two) times daily.   traMADol 50 MG tablet Commonly known as: ULTRAM Take 1 tablet by mouth 2 (two) times daily as needed for pain.   traZODone 50 MG tablet Commonly known as: DESYREL Take 50 mg by mouth at bedtime.        Yes  AHA/ACC Clinical Performance & Quality Measures: 1. Aspirin prescribed? - Yes 2. ADP Receptor Inhibitor (Plavix/Clopidogrel, Brilinta/Ticagrelor or Effient/Prasugrel) prescribed (includes medically managed patients)? - Yes 3. Beta Blocker prescribed? - No - recent cocaine use 4. High Intensity Statin (Lipitor 40-80mg  or Crestor 20-40mg ) prescribed? - Yes 5. EF assessed during THIS hospitalization? - Yes 6. For EF <40%, was ACEI/ARB prescribed? - Not Applicable (EF >/= 40%) 7. For EF <40%, Aldosterone Antagonist (Spironolactone or Eplerenone) prescribed? - Not Applicable (EF >/= 40%) 8. Cardiac Rehab Phase II ordered (Included Medically managed Patients)? - Yes   Outstanding Labs/Studies   FLP/LFTs in 8 weeks  Duration of Discharge Encounter   Greater than 30 minutes including physician time.  Signed, Laverda Page NP-C 02/08/2020, 2:06 PM

## 2020-02-08 NOTE — TOC Benefit Eligibility Note (Signed)
Transition of Care [facility]) Benefit Eligibility Note    Patient Details  Name: Vanessa Case MRN: [id number] Date of Birth: [birth year]   Medication/Dose: BRILINTA  90 MG BID  Covered?: Yes  Tier: (NON-TIER)  Prescription Coverage Preferred Pharmacy: [facility]   and  [place] with Person/Company/Phone Number:: DIANA   @ [employer] RX # [phone]  Co-Pay: [phone]  Prior Approval: No  Deductible: Unmet(OUT-OF-POCKET: UNMET)  ADDITIONAL NOTE: BRILINTA  CARD'S IN OFFICE    Mardene Sayer Phone Number: 02/08/2020, 11:10 AM

## 2020-02-15 NOTE — Progress Notes (Signed)
Cardiology Office Note:    Date:  02/18/2020   ID:  Vanessa Case, DOB [birth year], MRN [id number]  PCP:  [facility]  Cardiologist:  Jodelle Red, MD  Electrophysiologist:  None   Referring MD: [facility]   Chief Complaint: hospital follow-up for NSTEMI  History of Present Illness:    Vanessa Case is a 49 y.o. female with a history of CAD with recent NSTEMI on 02/05/2020 treated with DES to CX, hypertension, hyperlipidemia, diabetes mellitus with neuropathy, anemia, anxiety/depression, borderline personality disorder, PTSD,  fibromyalgia, chronic pain, and episodic cocaine abuse who is a patient of Dr. Cristal Deer and presents today for follow-up of NSTEMI.   Patient recently admitted from 02/05/2020 to 02/08/2020 with NSTEMI. Echo showed LVEF of 55-60% with normal wall motion and mild LVH. She underwent left heart catheterization on 02/07/2020 which showed 99% stenosis of proximal to mid CX which was felt to be the culprit lesion and was treated with DES. Otherwise, non-obstructive CAD. Started on dual antiplatelet therapy with Aspirin and Brilinta. No beta-blocker was initiated due to cocaine [place]. She was started on Cardizem 120mg  daily instead. Lipitor was increased to 80mg  daily.  Patient presents today for follow-up. Patient doing well since discharge. She notes one episode of chest pain the morning after she left the hospital while eating breakfast. She states it could have possibly been reflux but she did take 2 dose of sublingual Nitro and pain resolved. She has had no recurrent pain like that. She also notes sharp pain when laying on her left side initially but states she has not had this the past few days. No other chest pain. No exertional chest pain. She initially noted a little shortness of breath when walking to the bathroom after discharge but has not had any of this over the last week. No shortness of breath at rest. No orthopnea, PND,  or edema. She has noticed that when she wakes up in the morning she is a little dizzy and sweaty but this resolves when she eats breakfast. Sounds like her blood sugar may be low but she does not have a way to check this at home. No palpitations, falls, or syncope. Compliant with medications. No abnormal bleeding on DAPT.    Past Medical History:  Diagnosis Date  . Anemia   . Anxiety   . Arthritis   . Chronic abdominal pain   . Chronic back pain   . Chronic headache   . Chronic neck pain   . Depression   . Diabetes mellitus    adult onset dm-maintained on glipizide and metformin, pt states fasting glucose runs 110  . Fibromyalgia   . GERD (gastroesophageal reflux disease)   . Hyperlipidemia   . Hypertension    maintained on lisinopril hct, metoprolol-134/86 at pat visit  . Neuromuscular disorder (HCC)   . Neuropathy   . Obesity   . PTSD (post-traumatic stress disorder)   . Substance abuse [facility])     Past Surgical History:  Procedure Laterality Date  . ABDOMINAL HYSTERECTOMY  10/30/2011   Procedure: HYSTERECTOMY ABDOMINAL;  Surgeon: [facility], MD;  Location: [facility] ORS;  Service: Gynecology;  Laterality: N/A;  . CESAREAN SECTION  x3  . CHOLECYSTECTOMY N/A 06/28/2013   Procedure: LAPAROSCOPIC CHOLECYSTECTOMY;  Surgeon: Bing Plume, MD;  Location: [facility] OR;  Service: General;  Laterality: N/A;  . CORONARY STENT INTERVENTION N/A 02/07/2020   Procedure: CORONARY STENT INTERVENTION;  Surgeon: [facility], MD;  Location: [facility]  INVASIVE CV LAB;  Service: Cardiovascular;  Laterality: N/A;  . LEFT HEART CATH AND CORONARY ANGIOGRAPHY N/A 02/07/2020   Procedure: LEFT HEART CATH AND CORONARY ANGIOGRAPHY;  Surgeon: Marykay Lex, MD;  Location: [facility] INVASIVE CV LAB;  Service: Cardiovascular;  Laterality: N/A;  . SALPINGOOPHORECTOMY  10/30/2011   Procedure: SALPINGO OOPHERECTOMY;  Surgeon: Bing Plume, MD;  Location: [facility] ORS;  Service: Gynecology;  Laterality: Right;  . WRIST SURGERY      carpel tunnel and tendonitis    Current Medications: Current Meds  Medication Sig  . aspirin EC 81 MG EC tablet Take 1 tablet (81 mg total) by mouth daily.  Marland Kitchen atorvastatin (LIPITOR) 80 MG tablet Take 1 tablet (80 mg total) by mouth daily.  Marland Kitchen dicyclomine (BENTYL) 10 MG capsule Take 1 capsule (10 mg total) by mouth 4 (four) times daily -  before meals and at bedtime.  Marland Kitchen FLUoxetine (PROZAC) 40 MG capsule Take 40 mg by mouth daily.  . fluticasone (FLOVENT HFA) 44 MCG/ACT inhaler Inhale 2 puffs into the lungs 2 (two) times daily as needed (for shortness of breath).  . gabapentin (NEURONTIN) 300 MG capsule Take 300 mg by mouth 2 (two) times daily.  . hydrOXYzine (ATARAX/VISTARIL) 25 MG tablet Take 25 mg by mouth at bedtime as needed for itching or anxiety.  . metFORMIN (GLUCOPHAGE) 500 MG tablet Take 500 mg by mouth 2 (two) times daily.  . nitroGLYCERIN (NITROSTAT) 0.4 MG SL tablet Place 1 tablet (0.4 mg total) under the tongue every 5 (five) minutes x 3 doses as needed for chest pain.  Marland Kitchen omeprazole (PRILOSEC) 20 MG capsule Take 1 capsule (20 mg total) by mouth daily.  Marland Kitchen OZEMPIC, 0.25 OR 0.5 MG/DOSE, 2 MG/1.5ML SOPN Inject 0.5 mg into the skin every Saturday.  . prazosin (MINIPRESS) 1 MG capsule Take 1 capsule (1 mg total) by mouth at bedtime. To prevent nightmares  . QUEtiapine (SEROQUEL) 50 MG tablet Take 50 mg by mouth at bedtime.  . ticagrelor (BRILINTA) 90 MG TABS tablet Take 1 tablet (90 mg total) by mouth 2 (two) times daily.  . traMADol (ULTRAM) 50 MG tablet Take 1 tablet by mouth 2 (two) times daily as needed for pain.  . traZODone (DESYREL) 50 MG tablet Take 50 mg by mouth at bedtime.  . [DISCONTINUED] diltiazem (CARDIZEM CD) 120 MG 24 hr capsule Take 1 capsule (120 mg total) by mouth daily.     Allergies:   Percocet [oxycodone-acetaminophen] and Ambien [zolpidem]   Social History   Socioeconomic History  . Marital status: Married    Spouse name: Not on file  . Number of children:  1  . Years of education: Not on file  . Highest education level: Not on file  Occupational History  . Not on file  Tobacco Use  . Smoking status: Current Every Day Smoker  . Smokeless tobacco: Never Used  . Tobacco comment: has smoked for 14 years total - quit for 6 years at one point but has been smoking the last 3 years  Substance and Sexual Activity  . Alcohol use: Not Currently    Alcohol/week: 0.0 standard drinks  . Drug use: Yes    Types: Cocaine, Marijuana    Comment: last used cocaine 2 days ago and marijuana 1 day ago  . Sexual activity: Not on file  Other Topics Concern  . Not on file  Social History Narrative  . Not on file   Social Determinants of Health   Financial Resource  Strain:   . Difficulty of Paying Living Expenses:   Food Insecurity:   . Worried About Programme researcher, broadcasting/film/videounning Out of Food in the Last Year:   . Baristaan Out of Food in the Last Year:   Transportation Needs:   . Freight forwarderLack of Transportation (Medical):   Marland Kitchen. Lack of Transportation (Non-Medical):   Physical Activity:   . Days of Exercise per Week:   . Minutes of Exercise per Session:   Stress:   . Feeling of Stress :   Social Connections:   . Frequency of Communication with Friends and Family:   . Frequency of Social Gatherings with Friends and Family:   . Attends Religious Services:   . Active Member of Clubs or Organizations:   . Attends BankerClub or Organization Meetings:   Marland Kitchen. Marital Status:      Family History: The patient's family history includes Atrial fibrillation in her mother; Cystic fibrosis in her father; Diabetes in her maternal grandmother and mother; Heart disease in her maternal grandfather.  ROS:   Please see the history of present illness.    All other systems reviewed and are negative.  EKGs/Labs/Other Studies Reviewed:    The following studies were reviewed today:  Echocardiogram 02/06/2020: Impressions: 1. Left ventricular ejection fraction, by estimation, is 55 to 60%. The  left ventricle  has normal function. There is mild left ventricular  hypertrophy. Left ventricular diastolic parameters are indeterminate.  2. Right ventricular systolic function is normal. The right ventricular  size is normal. Tricuspid regurgitation signal is inadequate for assessing  PA pressure.  3. Left atrial size was mildly dilated.  4. The mitral valve is normal in structure. No evidence of mitral valve  regurgitation.  5. The aortic valve is tricuspid. Aortic valve regurgitation is not  visualized. Mild to moderate aortic valve sclerosis/calcification is  present, without any evidence of aortic stenosis.  6. The inferior vena cava is normal in size with greater than 50%  respiratory variability, suggesting right atrial pressure of 3 mmHg. _______________  Left Heart Catheterization 02/07/2020:  CULPRIT LESION: Prox Cx to Mid Cx lesion is 99% stenosed.  A drug-eluting stent was successfully placed using a STENT RESOLUTE ONYX 2.5X12. - post-dilated to 2.7 mm  Post intervention, there is a 0% residual stenosis.  ---------------------  Prox LAD to Mid LAD lesion is 20% stenosed with 30% stenosed side branch in 1st Diag.  Dist LAD lesion is 60% stenosed. Focal Lesion - recommend Med Rx unless Sx progress.  Mid Cx lesion is 20% stenosed.  ---------------------  LV end diastolic pressure is normal.   Summary:  Severe Single Vessel CAD - CULPRIT LESION mLCx 99% subtotalled / thrombotic stenosis  Successful DES PCI of mCx - Resolute Onyx DES 2.5 mm x 12 mm (2.7 mm)  Moderate m-distal LAD focal 60% in bend.  Otherwise minmal CAD elsewhere.  Normal LVEDP  EKG:  EKG ordered today. EKG personally reviewed and demonstrates normal sinus rhythm, rate 92 bpm, with mild T wave changes in inferior leads. Normal axis. PR and QRS interval normal. QTc 437 ms.   Recent Labs: 02/05/2020: ALT 10; TSH 0.556 02/08/2020: BUN 7; Creatinine, Ser 0.66; Hemoglobin 11.5; Platelets 210; Potassium 4.2;  Sodium 137  Recent Lipid Panel    Component Value Date/Time   CHOL 181 02/06/2020 0250   TRIG 201 (H) 02/06/2020 0250   HDL 40 (L) 02/06/2020 0250   CHOLHDL 4.5 02/06/2020 0250   VLDL 40 02/06/2020 0250   LDLCALC 101 (H) 02/06/2020 0250  Physical Exam:    Vital Signs: BP 128/72   Pulse 92   Ht 5\' 5"  (1.651 m)   Wt 161 lb 3.2 oz (73.1 kg)   LMP 10/07/2011   SpO2 98%   BMI 26.83 kg/m     Wt Readings from Last 3 Encounters:  02/18/20 161 lb 3.2 oz (73.1 kg)  02/08/20 160 lb 4.8 oz (72.7 kg)  03/22/19 172 lb (78 kg)     General: 49 y.o. female in no acute distress. HEENT: Normocephalic and atraumatic.  intact. Neck: Supple. No carotid bruits. No JVD. Heart: RRR. Distinct S1 and S2. No murmurs, gallops, or rubs. Radial pulses 2+ and equal bilaterally. Right radial cath site soft with no signs of hematoma. Lungs: No increased work of breathing. Clear to ausculation bilaterally. No wheezes, rhonchi, or rales.  Abdomen: Soft, non-distended, and non-tender to palpation. Extremities: No lower extremity edema.    Skin: Warm and dry. Neuro: Alert and oriented x3. No focal deficits. Psych: Normal affect. Responds appropriately.   Assessment:    1. Coronary artery disease involving native coronary artery of native heart without angina pectoris   2. Essential hypertension   3. Hyperlipidemia, unspecified hyperlipidemia type   4. Type 2 diabetes mellitus with complication, without long-term current use of insulin (HCC)   5. Cocaine abuse (HCC)   6. Tobacco abuse     Plan:    CAD with Recent NSTEMI - Patient recently admitted with NSTEMI. LHC showed 99% stenosis of proximal to mid CX which was treated with DES. Otherwise had non-obstructive CAD.  - She notes one episode of chest pain while eating following discharge that resolved with Nitro. No recurrent pain. Also had some sharp chest pain when laying on her left side - possibly musculoskeletal in nature. No exertional  pain. - Continue dual antiplatelet therapy with Aspirin 81mg  daily and Brilinta 90mg  twice daily.  - No beta-blocker due to cocaine abuse. - Continue high-intensity statin.   - Patient to notify 51 if she has additional chest pain or needs to use more sublingual Nitro. Continue Omeprazole for possible reflux.  - Recommend gradually increasing activity. Recommended Cardiac Rehab.   Hypertension - BP well controlled but heart rates in the 90's. Patient states heart rates are normally a little high.  - Will increase Cardizem to 180mg  daily for additional heart rate control. I think BP will tolerate this fine but discussed symptoms of hypotension and advised patient to notify if she experiences this.  Hyperlipidemia - Lipid panel on 02/06/2020: Total Cholesterol 181, Triglycerides 201, HDL 40, LDL 101.  - LDL <70 given CAD.  - Lipitor increased to 80mg  daily during recent admission. - Will need repeat lipid panel and LFTs in 8 weeks.   Type 2 Diabetes Mellitus - Hemoglobin A1c 9.2% during recent admission.  - She describes what sounds like possible mild hypoglycemic episodes first thing in the morning. - On Metformin and Ozempic.  - Recommend following up with PCP.  Polysubstance Abuse - Patient has history of tobacco, marijuana, and cocaine use. Urine drug screen during recent admission positive for cocaine and THC. - Patient states she has stopped using completely since being discharge.  - Congratulated her on this and re-emphasized the importance of complete cessation.  - Patient requested prescription of Nicotine gum so will provide this today. Discussed the importance of not using gum and smoking at the same time.   Disposition: Follow up in 3 months with Dr. [place].    Medication  Adjustments/Labs and Tests Ordered: Current medicines are reviewed at length with the patient today.  Concerns regarding medicines are outlined above.  Orders Placed This Encounter  Procedures  .  Lipid panel  . Hepatic function panel  . EKG 12-Lead   Meds ordered this encounter  Medications  . nicotine polacrilex (NICORETTE) 4 MG gum    Sig: Take 1 each (4 mg total) by mouth as needed for smoking cessation. Chew 1 piece every 2 hours as needed; no more than 24 pieces a day    Dispense:  150 each    Refill:  2  . diltiazem (CARDIZEM CD) 180 MG 24 hr capsule    Sig: Take 1 capsule (180 mg total) by mouth daily.    Dispense:  90 capsule    Refill:  3    Patient Instructions  Medication Instructions:  Your physician has recommended you make the following change in your medication:   1) Increase your Diltiazem to 180mg  daily 2) Nicorette Gum 2mg  as needed  *If you need a refill on your cardiac medications before your next appointment, please call your pharmacy*   Lab Work: Lipid, LFT in 6-8 weeks (June 11-25)  If you have labs (blood work) drawn today and your tests are completely normal, you will receive your results only by: Marland Kitchen MyChart Message (if you have MyChart) OR . A paper copy in the mail If you have any lab test that is abnormal or we need to change your treatment, we will call you to review the results.   Testing/Procedures: None   Follow-Up: At [facility], you and your health needs are our priority.  As part of our continuing mission to provide you with exceptional heart care, we have created designated Provider Care Teams.  These Care Teams include your primary Cardiologist (physician) and Advanced Practice Providers (APPs -  Physician Assistants and Nurse Practitioners) who all work together to provide you with the care you need, when you need it.  We recommend signing up for the patient portal called "MyChart".  Sign up information is provided on this After Visit Summary.  MyChart is used to connect with patients for Virtual Visits (Telemedicine).  Patients are able to view lab/test results, encounter notes, upcoming appointments, etc.  Non-urgent  messages can be sent to your provider as well.   To learn more about what you can do with MyChart, go to [email or web address].    Your next appointment:   3 month(s)  The format for your next appointment:   In Person  Provider:   Buford Dresser, MD   Other Instructions None     Signed, Darreld Mclean, PA-C  02/18/2020 2:37 PM    [place]

## 2020-02-18 ENCOUNTER — Ambulatory Visit (INDEPENDENT_AMBULATORY_CARE_PROVIDER_SITE_OTHER): Payer: BC Managed Care – PPO | Admitting: Student

## 2020-02-18 ENCOUNTER — Other Ambulatory Visit: Payer: Self-pay

## 2020-02-18 ENCOUNTER — Encounter: Payer: Self-pay | Admitting: Student

## 2020-02-18 VITALS — BP 128/72 | HR 92 | Ht 65.0 in | Wt 161.2 lb

## 2020-02-18 DIAGNOSIS — I10 Essential (primary) hypertension: Secondary | ICD-10-CM

## 2020-02-18 DIAGNOSIS — E118 Type 2 diabetes mellitus with unspecified complications: Secondary | ICD-10-CM

## 2020-02-18 DIAGNOSIS — Z720 Tobacco use: Secondary | ICD-10-CM

## 2020-02-18 DIAGNOSIS — I2510 Atherosclerotic heart disease of native coronary artery without angina pectoris: Secondary | ICD-10-CM | POA: Diagnosis not present

## 2020-02-18 DIAGNOSIS — E785 Hyperlipidemia, unspecified: Secondary | ICD-10-CM | POA: Diagnosis not present

## 2020-02-18 DIAGNOSIS — F1410 Cocaine abuse, uncomplicated: Secondary | ICD-10-CM

## 2020-02-18 MED ORDER — DILTIAZEM HCL ER COATED BEADS 180 MG PO CP24: 180.0000 mg | ORAL_CAPSULE | Freq: Every day | ORAL | 3 refills | Status: DC

## 2020-02-18 MED ORDER — NICOTINE POLACRILEX 4 MG MT GUM: 4.0000 mg | CHEWING_GUM | OROMUCOSAL | 2 refills | Status: DC | PRN

## 2020-02-18 NOTE — Patient Instructions (Signed)
Medication Instructions:  Your physician has recommended you make the following change in your medication:   1) Increase your Diltiazem to 180mg  daily 2) Nicorette Gum 2mg  as needed  *If you need a refill on your cardiac medications before your next appointment, please call your pharmacy*   Lab Work: Lipid, LFT in 6-8 weeks (June 11-25)  If you have labs (blood work) drawn today and your tests are completely normal, you will receive your results only by: MyChart Message (if you have MyChart) OR . A paper copy in the mail If you have any lab test that is abnormal or we need to change your treatment, we will call you to review the results.   Testing/Procedures: None   Follow-Up: At [facility], you and your health needs are our priority.  As part of our continuing mission to provide you with exceptional heart care, we have created designated Provider Care Teams.  These Care Teams include your primary Cardiologist (physician) and Advanced Practice Providers (APPs -  Physician Assistants and Nurse Practitioners) who all work together to provide you with the care you need, when you need it.  We recommend signing up for the patient portal called "MyChart".  Sign up information is provided on this After Visit Summary.  MyChart is used to connect with patients for Virtual Visits (Telemedicine).  Patients are able to view lab/test results, encounter notes, upcoming appointments, etc.  Non-urgent messages can be sent to your provider as well.   To learn more about what you can do with MyChart, go to Marland Kitchen.    Your next appointment:   3 month(s)  The format for your next appointment:   In Person  Provider:   [facility], MD   Other Instructions None

## 2020-03-03 ENCOUNTER — Other Ambulatory Visit: Payer: Self-pay

## 2020-03-06 ENCOUNTER — Encounter (HOSPITAL_COMMUNITY): Payer: Self-pay | Admitting: *Deleted

## 2020-03-06 ENCOUNTER — Other Ambulatory Visit: Payer: Self-pay

## 2020-03-06 NOTE — Progress Notes (Signed)
Received referral from Dr. Cristal Deer for this pt to participate in cardiac rehab with the diagnosis of NSTEMI and DES to CX in April 2021.  Clinical review of pt follow up appt on 4/30 with Marjie Skiff PA - cardiologist office note. Pt is making the expected progress in recovery.  Pt appropriate for scheduling once we receive the medicaid reimbursement form from Dr. Cristal Deer for on site cardiac rehab and/or enrollment in Virtual Cardiac Rehab.  Pt Covid score is 3.  Will forward to staff for follow up. Alanson Aly, BSN Cardiac and Emergency planning/management officer

## 2020-03-14 ENCOUNTER — Telehealth (HOSPITAL_COMMUNITY): Payer: Self-pay

## 2020-03-14 NOTE — Telephone Encounter (Signed)
Pt insurance is active and benefits verified through [place]. Co-pay $0.00, DED $2,500.00/$2,500.00 met, out of pocket $4,500.00/$4,500.00 met, co-insurance 20%. No pre-authorization required. Passport, 03/14/20 @ 11:15AM, VNR#[id number]  Pt insurance is active through Medicaid. UGA#[id number], 03/14/20 @ 11:24AM  Will fax over Medicaid Reimbursement form to Dr. Harrell Gave  Will contact patient to see if she is interested in the Cardiac Rehab Program.

## 2020-03-17 ENCOUNTER — Telehealth (HOSPITAL_COMMUNITY): Payer: Self-pay

## 2020-03-17 NOTE — Telephone Encounter (Signed)
Called patient to see if she was interested in participating in the Cardiac Rehab Program. Patient stated yes. Patient will come in for orientation on 05/02/2020 @ 10AM and will attend the 9:15AM exercise class. Went over insurance, patient verbalized understanding.   Mailed letter.

## 2020-03-24 ENCOUNTER — Other Ambulatory Visit: Payer: Self-pay

## 2020-03-24 ENCOUNTER — Emergency Department (HOSPITAL_COMMUNITY): Payer: BC Managed Care – PPO

## 2020-03-24 ENCOUNTER — Encounter (HOSPITAL_COMMUNITY): Payer: Self-pay | Admitting: Emergency Medicine

## 2020-03-24 ENCOUNTER — Emergency Department (HOSPITAL_COMMUNITY)
Admission: EM | Admit: 2020-03-24 | Discharge: 2020-03-24 | Disposition: A | Discharge status: Home / Self Care | Payer: BC Managed Care – PPO | Attending: Emergency Medicine | Admitting: Emergency Medicine

## 2020-03-24 DIAGNOSIS — F319 Bipolar disorder, unspecified: Secondary | ICD-10-CM | POA: Diagnosis not present

## 2020-03-24 DIAGNOSIS — Z7982 Long term (current) use of aspirin: Secondary | ICD-10-CM | POA: Diagnosis not present

## 2020-03-24 DIAGNOSIS — R0789 Other chest pain: Secondary | ICD-10-CM | POA: Diagnosis present

## 2020-03-24 DIAGNOSIS — I252 Old myocardial infarction: Secondary | ICD-10-CM | POA: Diagnosis not present

## 2020-03-24 DIAGNOSIS — F17210 Nicotine dependence, cigarettes, uncomplicated: Secondary | ICD-10-CM | POA: Diagnosis not present

## 2020-03-24 DIAGNOSIS — E119 Type 2 diabetes mellitus without complications: Secondary | ICD-10-CM | POA: Diagnosis not present

## 2020-03-24 DIAGNOSIS — I10 Essential (primary) hypertension: Secondary | ICD-10-CM | POA: Diagnosis not present

## 2020-03-24 DIAGNOSIS — Z7984 Long term (current) use of oral hypoglycemic drugs: Secondary | ICD-10-CM | POA: Insufficient documentation

## 2020-03-24 DIAGNOSIS — Z79899 Other long term (current) drug therapy: Secondary | ICD-10-CM | POA: Diagnosis not present

## 2020-03-24 LAB — I-STAT CHEM 8, ED
BUN: 5 mg/dL — ABNORMAL LOW (ref 6–20)
Calcium, Ion: 1.42 mmol/L — ABNORMAL HIGH (ref 1.15–1.40)
Chloride: 102 mmol/L (ref 98–111)
Creatinine, Ser: 0.6 mg/dL (ref 0.44–1.00)
Glucose, Bld: 270 mg/dL — ABNORMAL HIGH (ref 70–99)
HCT: 35 % — ABNORMAL LOW (ref 36.0–46.0)
Hemoglobin: 11.9 g/dL — ABNORMAL LOW (ref 12.0–15.0)
Potassium: 3.6 mmol/L (ref 3.5–5.1)
Sodium: 138 mmol/L (ref 135–145)
TCO2: 23 mmol/L (ref 22–32)

## 2020-03-24 LAB — CBC WITH DIFFERENTIAL/PLATELET
Abs Immature Granulocytes: 0.04 10*3/uL (ref 0.00–0.07)
Basophils Absolute: 0 10*3/uL (ref 0.0–0.1)
Basophils Relative: 1 %
Eosinophils Absolute: 0.1 10*3/uL (ref 0.0–0.5)
Eosinophils Relative: 1 %
HCT: 36.1 % (ref 36.0–46.0)
Hemoglobin: 12 g/dL (ref 12.0–15.0)
Immature Granulocytes: 1 %
Lymphocytes Relative: 34 %
Lymphs Abs: 2.8 10*3/uL (ref 0.7–4.0)
MCH: 29.2 pg (ref 26.0–34.0)
MCHC: 33.2 g/dL (ref 30.0–36.0)
MCV: 87.8 fL (ref 80.0–100.0)
Monocytes Absolute: 0.4 10*3/uL (ref 0.1–1.0)
Monocytes Relative: 5 %
Neutro Abs: 4.9 10*3/uL (ref 1.7–7.7)
Neutrophils Relative %: 58 %
Platelets: 284 10*3/uL (ref 150–400)
RBC: 4.11 MIL/uL (ref 3.87–5.11)
RDW: 13.6 % (ref 11.5–15.5)
WBC: 8.3 10*3/uL (ref 4.0–10.5)
nRBC: 0 % (ref 0.0–0.2)

## 2020-03-24 LAB — I-STAT BETA HCG BLOOD, ED (MC, WL, AP ONLY): I-stat hCG, quantitative: <5 m[IU]/mL (ref <5)

## 2020-03-24 LAB — TROPONIN I (HIGH SENSITIVITY)
Troponin I (High Sensitivity): 4 ng/L (ref <18)
Troponin I (High Sensitivity): 4 ng/L (ref <18)

## 2020-03-24 MED ORDER — IOHEXOL 350 MG/ML SOLN
75.0000 mL | Freq: Once | INTRAVENOUS | Status: AC | PRN
  Administered 2020-03-24: 75 mL via INTRAVENOUS

## 2020-03-24 MED ORDER — KETOROLAC TROMETHAMINE 30 MG/ML IJ SOLN
30.0000 mg | Freq: Once | INTRAMUSCULAR | Status: AC
  Administered 2020-03-24: 30 mg via INTRAVENOUS
  Filled 2020-03-24: qty 1

## 2020-03-24 NOTE — ED Provider Notes (Signed)
[facility] Provider Note   CSN: [id number] Arrival date & time: 03/24/20  0316     History Chief Complaint  Patient presents with  . Chest Pain    Vanessa Case is a 49 y.o. female.  The history is provided by the patient.  Chest Pain Pain location:  L chest Pain quality: dull   Pain radiates to:  Does not radiate Pain severity:  Moderate Onset quality:  Gradual Duration:  2 days Timing:  Constant Progression:  Improving Chronicity:  Recurrent Context: drug use and at rest   Context: not intercourse, not lifting and not movement   Context comment:  Used crack tonight, but pain was already present for over one day  Relieved by:  Nothing Worsened by:  Nothing Ineffective treatments:  None tried Associated symptoms: no abdominal pain, no AICD problem, no altered mental status, no anorexia, no anxiety, no back pain, no claudication, no cough, no diaphoresis, no dizziness, no dysphagia, no fatigue, no fever, no headache, no heartburn, no lower extremity edema, no nausea, no near-syncope, no numbness, no orthopnea, no palpitations, no PND, no shortness of breath, no syncope, no vomiting and no weakness   Risk factors: not female and no prior DVT/PE        Past Medical History:  Diagnosis Date  . Anemia   . Anxiety   . Arthritis   . Chronic abdominal pain   . Chronic back pain   . Chronic headache   . Chronic neck pain   . Depression   . Diabetes mellitus    adult onset dm-maintained on glipizide and metformin, pt states fasting glucose runs 110  . Fibromyalgia   . GERD (gastroesophageal reflux disease)   . Hyperlipidemia   . Hypertension    maintained on lisinopril hct, metoprolol-134/86 at pat visit  . Neuromuscular disorder (HCC)   . Neuropathy   . Obesity   . PTSD (post-traumatic stress disorder)   . Substance abuse [facility](HCC)     Patient Active Problem List   Diagnosis Date Noted  . Substance abuse (HCC) 02/08/2020   . NSTEMI (non-ST elevated myocardial infarction) (HCC) 02/05/2020  . Suicidal ideation 07/20/2015  . Major depressive disorder, recurrent, severe without psychotic features (HCC)   . Type 2 diabetes mellitus with diabetic polyneuropathy (HCC) 05/30/2015  . Other fatigue 05/30/2015  . Essential hypertension 05/30/2015  . HLD (hyperlipidemia) 05/30/2015  . Bipolar 2 disorder, major depressive episode (HCC) 09/20/2014  . Cholelithiasis with acute cholecystitis 06/28/2013  . Right arm weakness 03/05/2013    Past Surgical History:  Procedure Laterality Date  . ABDOMINAL HYSTERECTOMY  10/30/2011   Procedure: HYSTERECTOMY ABDOMINAL;  Surgeon: Bing Plumehomas F Henley, MD;  Location: [facility] ORS;  Service: Gynecology;  Laterality: N/A;  . CESAREAN SECTION  x3  . CHOLECYSTECTOMY N/A 06/28/2013   Procedure: LAPAROSCOPIC CHOLECYSTECTOMY;  Surgeon: Shelly Rubensteinouglas A Blackman, MD;  [facility];  Service: General;  Laterality: N/A;  . CORONARY STENT INTERVENTION N/A 02/07/2020   Procedure: CORONARY STENT INTERVENTION;  Surgeon: Marykay LexHarding, David W, MD;  [facility];  Service: Cardiovascular;  Laterality: N/A;  . LEFT HEART CATH AND CORONARY ANGIOGRAPHY N/A 02/07/2020   Procedure: LEFT HEART CATH AND CORONARY ANGIOGRAPHY;  Surgeon: Marykay LexHarding, David W, MD;  [facility];  Service: Cardiovascular;  Laterality: N/A;  . SALPINGOOPHORECTOMY  10/30/2011   Procedure: SALPINGO OOPHERECTOMY;  Surgeon: Bing Plumehomas F Henley, MD;  Location: [facility] ORS;  Service: Gynecology;  Laterality: Right;  .  WRIST SURGERY     carpel tunnel and tendonitis     OB History    Gravida  3   Para  3   Term  3   Preterm      AB      Living  3     SAB      TAB      Ectopic      Multiple      Live Births              Family History  Problem Relation Age of Onset  . Diabetes Mother   . Atrial fibrillation Mother   . Cystic fibrosis Father   . Diabetes Maternal Grandmother   . Heart disease Maternal  Grandfather     Social History   Tobacco Use  . Smoking status: Current Every Day Smoker  . Smokeless tobacco: Never Used  . Tobacco comment: has smoked for 14 years total - quit for 6 years at one point but has been smoking the last 3 years  Substance Use Topics  . Alcohol use: Not Currently    Alcohol/week: 0.0 standard drinks  . Drug use: Yes    Types: Cocaine, Marijuana    Comment: last used cocaine 2 days ago and marijuana 1 day ago    Home Medications Prior to Admission medications   Medication Sig Start Date End Date Taking? Authorizing Provider  acetaminophen (TYLENOL) 500 MG tablet Take 1,000 mg by mouth daily as needed for mild pain.   Yes [provider]  aspirin EC 81 MG EC tablet Take 1 tablet (81 mg total) by mouth daily. 02/09/20  Yes Arty Baumgartner, NP  atorvastatin (LIPITOR) 80 MG tablet Take 1 tablet (80 mg total) by mouth daily. 02/09/20  Yes Arty Baumgartner, NP  dicyclomine (BENTYL) 10 MG capsule Take 1 capsule (10 mg total) by mouth 4 (four) times daily -  before meals and at bedtime. 01/15/18  Yes Meryl Dare, MD  diltiazem (CARDIZEM CD) 180 MG 24 hr capsule Take 1 capsule (180 mg total) by mouth daily. 02/18/20 05/18/20 Yes Goodrich, Callie E, PA-C  FLUoxetine (PROZAC) 40 MG capsule Take 40 mg by mouth daily. 12/22/19  Yes [provider]  fluticasone (FLOVENT HFA) 44 MCG/ACT inhaler Inhale 2 puffs into the lungs 2 (two) times daily as needed (for shortness of breath). 03/14/16  Yes Benjiman Core, MD  gabapentin (NEURONTIN) 300 MG capsule Take 300 mg by mouth 2 (two) times daily. 12/22/19  Yes [provider]  metFORMIN (GLUCOPHAGE) 500 MG tablet Take 500 mg by mouth 2 (two) times daily. 12/22/19  Yes [provider]  nitroGLYCERIN (NITROSTAT) 0.4 MG SL tablet Place 1 tablet (0.4 mg total) under the tongue every 5 (five) minutes x 3 doses as needed for chest pain. 02/08/20  Yes Laverda Page B, NP  prazosin (MINIPRESS) 1  MG capsule Take 1 capsule (1 mg total) by mouth at bedtime. To prevent nightmares 07/25/15  Yes Withrow, Everardo All, FNP  QUEtiapine (SEROQUEL) 50 MG tablet Take 50 mg by mouth at bedtime.   Yes [provider]  ticagrelor (BRILINTA) 90 MG TABS tablet Take 1 tablet (90 mg total) by mouth 2 (two) times daily. 02/08/20  Yes Arty Baumgartner, NP  traMADol (ULTRAM) 50 MG tablet Take 1 tablet by mouth 2 (two) times daily as needed for pain. 12/22/19  Yes [provider]  traZODone (DESYREL) 50 MG tablet Take 50 mg by mouth  at bedtime. 01/16/20  Yes [provider]  nicotine polacrilex (NICORETTE) 4 MG gum Take 1 each (4 mg total) by mouth as needed for smoking cessation. Chew 1 piece every 2 hours as needed; no more than 24 pieces a day 02/18/20   Darreld Mclean, PA-C  omeprazole (PRILOSEC) 20 MG capsule Take 1 capsule (20 mg total) by mouth daily. 04/08/18   Hedges, Dellis Filbert, PA-C  OZEMPIC, 0.25 OR 0.5 MG/DOSE, 2 MG/1.5ML SOPN Inject 0.5 mg into the skin every Saturday. 01/26/20   [provider]    Allergies    Percocet [oxycodone-acetaminophen] and Ambien [zolpidem]  Review of Systems   Review of Systems  Constitutional: Negative for diaphoresis, fatigue and fever.  HENT: Negative for congestion and trouble swallowing.   Eyes: Negative for visual disturbance.  Respiratory: Negative for cough and shortness of breath.   Cardiovascular: Positive for chest pain. Negative for palpitations, orthopnea, claudication, syncope, PND and near-syncope.  Gastrointestinal: Negative for abdominal pain, anorexia, heartburn, nausea and vomiting.  Genitourinary: Negative for difficulty urinating.  Musculoskeletal: Negative for back pain.  Skin: Negative for rash.  Neurological: Negative for dizziness, weakness, numbness and headaches.  Psychiatric/Behavioral: Negative for agitation.  All other systems reviewed and are negative.   Physical Exam Updated Vital Signs BP (!) 146/79    Pulse 85   Temp 98.2 F (36.8 C) (Oral)   Resp 16   Ht 5\' 5"  (1.651 m)   Wt 72.6 kg   LMP 10/07/2011   SpO2 100%   BMI 26.63 kg/m   Physical Exam Vitals and nursing note reviewed.  Constitutional:      General: She is not in acute distress.    Appearance: Normal appearance.  HENT:     Head: Normocephalic and atraumatic.     Nose: Nose normal.  Eyes:     Conjunctiva/sclera: Conjunctivae normal.     Pupils: Pupils are equal, round, and reactive to light.  Cardiovascular:     Rate and Rhythm: Normal rate and regular rhythm.     Pulses: Normal pulses.     Heart sounds: Normal heart sounds.  Pulmonary:     Effort: Pulmonary effort is normal.     Breath sounds: Normal breath sounds.  Chest:     Chest wall: Tenderness present.  Abdominal:     General: Abdomen is flat. Bowel sounds are normal.     Tenderness: There is no abdominal tenderness. There is no guarding.  Musculoskeletal:        General: Normal range of motion.     Cervical back: Normal range of motion and neck supple.  Skin:    General: Skin is warm and dry.     Capillary Refill: Capillary refill takes less than 2 seconds.  Neurological:     General: No focal deficit present.     Mental Status: She is alert and oriented to person, place, and time.     Deep Tendon Reflexes: Reflexes normal.  Psychiatric:        Mood and Affect: Mood normal.        Behavior: Behavior normal.     ED Results / Procedures / Treatments   Labs (all labs ordered are listed, but only abnormal results are displayed) Results for orders placed or performed during the hospital encounter of 03/24/20  CBC with Differential/Platelet  Result Value Ref Range   WBC 8.3 4.0 - 10.5 K/uL   RBC 4.11 3.87 - 5.11 MIL/uL   Hemoglobin 12.0 12.0 - 15.0  g/dL   HCT 75.6 43.3 - 29.5 %   MCV 87.8 80.0 - 100.0 fL   MCH 29.2 26.0 - 34.0 pg   MCHC 33.2 30.0 - 36.0 g/dL   RDW 18.8 41.6 - 60.6 %   Platelets 284 150 - 400 K/uL   nRBC 0.0 0.0 - 0.2 %     Neutrophils Relative % 58 %   Neutro Abs 4.9 1.7 - 7.7 K/uL   Lymphocytes Relative 34 %   Lymphs Abs 2.8 0.7 - 4.0 K/uL   Monocytes Relative 5 %   Monocytes Absolute 0.4 0.1 - 1.0 K/uL   Eosinophils Relative 1 %   Eosinophils Absolute 0.1 0.0 - 0.5 K/uL   Basophils Relative 1 %   Basophils Absolute 0.0 0.0 - 0.1 K/uL   Immature Granulocytes 1 %   Abs Immature Granulocytes 0.04 0.00 - 0.07 K/uL  I-stat chem 8, ED (not at [facility] or [facility])  Result Value Ref Range   Sodium 138 135 - 145 mmol/L   Potassium 3.6 3.5 - 5.1 mmol/L   Chloride 102 98 - 111 mmol/L   BUN 5 (L) 6 - 20 mg/dL   Creatinine, Ser 3.01 0.44 - 1.00 mg/dL   Glucose, Bld 601 (H) 70 - 99 mg/dL   Calcium, Ion 0.93 (H) 1.15 - 1.40 mmol/L   TCO2 23 22 - 32 mmol/L   Hemoglobin 11.9 (L) 12.0 - 15.0 g/dL   HCT 23.5 (L) 57.3 - 22.0 %  I-Stat Beta hCG blood, ED ([facility], [facility], [facility] only)  Result Value Ref Range   I-stat hCG, quantitative <5.0 <5 mIU/mL   Comment 3          Troponin I (High Sensitivity)  Result Value Ref Range   Troponin I (High Sensitivity) 4 <18 ng/L   CT Angio Chest PE W and/or Wo Contrast  Result Date: 03/24/2020 CLINICAL DATA:  Chest pain EXAM: CT ANGIOGRAPHY CHEST WITH CONTRAST TECHNIQUE: Multidetector CT imaging of the chest was performed using the standard protocol during bolus administration of intravenous contrast. Multiplanar CT image reconstructions and MIPs were obtained to evaluate the vascular anatomy. CONTRAST:  31mL OMNIPAQUE IOHEXOL 350 MG/ML SOLN COMPARISON:  None. FINDINGS: Cardiovascular: There is a optimal opacification of the pulmonary arteries. There is no central,segmental, or subsegmental filling defects within the pulmonary arteries. The heart is normal in size. No pericardial effusion or thickening. No evidence right heart strain. There is normal three-vessel brachiocephalic anatomy without proximal stenosis. The thoracic aorta is normal in appearance. Mediastinum/Nodes: No hilar, mediastinal, or  axillary adenopathy. Thyroid gland, trachea, and esophagus demonstrate no significant findings. Lungs/Pleura: The lungs are clear. No pleural effusion or pneumothorax. No airspace consolidation. Upper Abdomen: No acute abnormalities present in the visualized portions of the upper abdomen. Musculoskeletal: No chest wall abnormality. No acute or significant osseous findings. Review of the MIP images confirms the above findings. IMPRESSION: No central, segmental, or subsegmental pulmonary embolism. No acute intrathoracic pathology to explain the patient's symptoms. Electronically Signed   By: Jonna Clark M.D.   On: 03/24/2020 05:34   DG Chest Portable 1 View  Result Date: 03/24/2020 CLINICAL DATA:  Chest pain for 2 days EXAM: PORTABLE CHEST 1 VIEW COMPARISON:  02/05/2020 FINDINGS: The heart size and mediastinal contours are within normal limits. Both lungs are clear. The visualized skeletal structures are unremarkable. IMPRESSION: No active disease. Electronically Signed   By: Alcide Clever M.D.   On: 03/24/2020 03:39    EKG EKG Interpretation  Date/Time:  Friday March 24 2020 03:29:41 EDT Ventricular Rate:  82 PR Interval:    QRS Duration: 82 QT Interval:  365 QTC Calculation: 427 R Axis:   85 Text Interpretation: Sinus rhythm Confirmed by Nicanor Alcon, Ahmeer Tuman ([id number]) on 03/24/2020 4:08:47 AM   Radiology CT Angio Chest PE W and/or Wo Contrast  Result Date: 03/24/2020 CLINICAL DATA:  Chest pain EXAM: CT ANGIOGRAPHY CHEST WITH CONTRAST TECHNIQUE: Multidetector CT imaging of the chest was performed using the standard protocol during bolus administration of intravenous contrast. Multiplanar CT image reconstructions and MIPs were obtained to evaluate the vascular anatomy. CONTRAST:  56mL OMNIPAQUE IOHEXOL 350 MG/ML SOLN COMPARISON:  None. FINDINGS: Cardiovascular: There is a optimal opacification of the pulmonary arteries. There is no central,segmental, or subsegmental filling defects within the pulmonary  arteries. The heart is normal in size. No pericardial effusion or thickening. No evidence right heart strain. There is normal three-vessel brachiocephalic anatomy without proximal stenosis. The thoracic aorta is normal in appearance. Mediastinum/Nodes: No hilar, mediastinal, or axillary adenopathy. Thyroid gland, trachea, and esophagus demonstrate no significant findings. Lungs/Pleura: The lungs are clear. No pleural effusion or pneumothorax. No airspace consolidation. Upper Abdomen: No acute abnormalities present in the visualized portions of the upper abdomen. Musculoskeletal: No chest wall abnormality. No acute or significant osseous findings. Review of the MIP images confirms the above findings. IMPRESSION: No central, segmental, or subsegmental pulmonary embolism. No acute intrathoracic pathology to explain the patient's symptoms. Electronically Signed   By: Jonna Clark M.D.   On: 03/24/2020 05:34   DG Chest Portable 1 View  Result Date: 03/24/2020 CLINICAL DATA:  Chest pain for 2 days EXAM: PORTABLE CHEST 1 VIEW COMPARISON:  02/05/2020 FINDINGS: The heart size and mediastinal contours are within normal limits. Both lungs are clear. The visualized skeletal structures are unremarkable. IMPRESSION: No active disease. Electronically Signed   By: Alcide Clever M.D.   On: 03/24/2020 03:39    Procedures Procedures (including critical care time)  Medications Ordered in ED Medications  ketorolac (TORADOL) 30 MG/ML injection 30 mg (30 mg Intravenous Given 03/24/20 0644)  iohexol (OMNIPAQUE) 350 MG/ML injection 75 mL (75 mLs Intravenous Contrast Given 03/24/20 9417)    ED Course  I have reviewed the triage vital signs and the nursing notes.  Pertinent labs & imaging results that were available during my care of the patient were reviewed by me and considered in my medical decision making (see chart for details).    Ruled out for MI and PE in the ED. I have told the patient it is imperative that she stop  using crack as this will accelerate her heart disease and cause vasospasm.  I believe this is actually chest wall pain.  I have advised the patient to call cardiology for follow up.   Jamyria Fleurette Woolbright was evaluated in Emergency Department on 03/24/2020 for the symptoms described in the history of present illness. She was evaluated in the context of the global COVID-19 pandemic, which necessitated consideration that the patient might be at risk for infection with the SARS-CoV-2 virus that causes COVID-19. Institutional protocols and algorithms that pertain to the evaluation of patients at risk for COVID-19 are in a state of rapid change based on information released by regulatory bodies including the CDC and federal and state organizations. These policies and algorithms were followed during the patient's care in the ED.  Final Clinical Impression(s) / ED Diagnoses Return for intractable cough, coughing up blood,fevers >100.4 unrelieved by medication, shortness of breath, intractable  vomiting, chest pain, shortness of breath, weakness,numbness, changes in speech, facial asymmetry,abdominal pain, passing out,Inability to tolerate liquids or food, cough, altered mental status or any concerns. No signs of systemic illness or infection. The patient is nontoxic-appearing on exam and vital signs are within normal limits.   I have reviewed the triage vital signs and the nursing notes. Pertinent labs &imaging results that were available during my care of the patient were reviewed by me and considered in my medical decision making (see chart for details).After history, exam, and medical workup I feel the patient has beenappropriately medically screened and is safe for discharge home. Pertinent diagnoses were discussed with the patient. Patient was given return precautions.   Duval Macleod, MD 03/24/20 [phone]

## 2020-03-24 NOTE — ED Triage Notes (Signed)
Pt arrive via [facility] from home in ref. To Chest pain.Pt stated that she has been experiencing chest pain for the past 2 days. She states that the pain she feels is similar to her two MI's. Pt reports pain in subternal area radiating to the right chest.  Pt given 324 of Asprin and two Nitro. Nitro seems to be effective as Pt reports her pain level to be a 2.  EMS vitals 126/81, HR 94, 100% RA, 260 CBG  Pt did admit to smoking crack earlier this evening at approximately 1900.

## 2020-04-26 ENCOUNTER — Telehealth (HOSPITAL_COMMUNITY): Payer: Self-pay

## 2020-04-27 ENCOUNTER — Telehealth (HOSPITAL_COMMUNITY): Payer: Self-pay | Admitting: *Deleted

## 2020-04-27 NOTE — Telephone Encounter (Signed)
Attempted to contact pt emergency contact listed in effort to reach this pt regarding cardiac rehab.  The phone number listed is not a working phone number. Vanessa Case, BSN Cardiac and Emergency planning/management officer

## 2020-04-27 NOTE — Telephone Encounter (Signed)
Copy of letter sent by email and mail:  Vanessa Case,  I am reaching out to you by email.  I was unable to get in touch with you by phone.  The numbers listed are not working numbers.  In preparation for your upcoming orientation appointment 7/13, I see that you had an ER visit on 6/4 for chest pain.  It was recommended by the ER MD to follow up with your cardiologist. I do not see that you have completed a follow up appt.  We will need for you to contact your cardiologist office and schedule an appointment first. Once this has been successfully completed, we can then reschedule your cardiac rehab orientation appointment.  Please hold on coming to cardiac rehab orientation until you are contacted.  Please respond back either my email or phone that you have received this information.  Thank you  Anslee Micheletti Les Pou RN, BSN Kaladin Noseworthy Les Pou RN, BSN [place] [facility] [place]. [facility] [place]. [facility] for Cardiovascular and Pulmonary Health RN Navigator Direct Dial:  [phone] Fax: [phone] Departmental Dial [phone]

## 2020-04-27 NOTE — Telephone Encounter (Signed)
Preparation for patients upcoming appt for cardiac rehab orientation noted pt with ER Visit on 6/4 for chest pain.  Pt will need to complete follow up appt with cardiology as recommended by ER MD.  Pt has not completed.  Attempted to call pt on preferred number - mobile unable to leave message.  Attempted to call on phone number listed as home- not a valid number. Pt my chart although active last login was 2016.  Will send pt letter indicating holding on participating in cardiac rehab until follow up appt has been completed. Will cancel her appointments. Alanson Aly, BSN Cardiac and Emergency planning/management officer

## 2020-04-27 NOTE — Telephone Encounter (Signed)
Cardiac Rehab - Pharmacy Resident Documentation   Patient unable to be reached after three call attempts as the phone numbers seem to be invalid. After attempts, it was noticed that the presented to the ER on 6/4 for chest pain. The patient needed to complete a follow-up appointment with cardiology per the ER physician. The patient has not yet scheduled an appointment. The patients cardiac rehab orientation will be rescheduled once the follow-up is completed. Once the orientation is rescheduled, pharmacy will complete a medication history/review and allergy verification.  Sanda Klein, PharmD, RPh  PGY-1 Pharmacy Resident Office: [phone] 04/27/2020 7:02 PM  Please check [email or web address] for unit-specific pharmacy phone numbers.

## 2020-05-02 ENCOUNTER — Ambulatory Visit (HOSPITAL_COMMUNITY): Payer: BC Managed Care – PPO

## 2020-05-08 ENCOUNTER — Ambulatory Visit (HOSPITAL_COMMUNITY): Payer: BC Managed Care – PPO

## 2020-05-10 ENCOUNTER — Ambulatory Visit (HOSPITAL_COMMUNITY): Payer: BC Managed Care – PPO

## 2020-05-12 ENCOUNTER — Ambulatory Visit (HOSPITAL_COMMUNITY): Payer: BC Managed Care – PPO

## 2020-05-12 ENCOUNTER — Emergency Department (HOSPITAL_COMMUNITY)
Admission: EM | Admit: 2020-05-12 | Discharge: 2020-05-12 | Disposition: A | Discharge status: Home / Self Care | Payer: BC Managed Care – PPO | Attending: Emergency Medicine | Admitting: Emergency Medicine

## 2020-05-12 DIAGNOSIS — Z5321 Procedure and treatment not carried out due to patient leaving prior to being seen by health care provider: Secondary | ICD-10-CM | POA: Insufficient documentation

## 2020-05-12 DIAGNOSIS — R42 Dizziness and giddiness: Secondary | ICD-10-CM | POA: Insufficient documentation

## 2020-05-12 DIAGNOSIS — R531 Weakness: Secondary | ICD-10-CM | POA: Insufficient documentation

## 2020-05-12 DIAGNOSIS — R079 Chest pain, unspecified: Secondary | ICD-10-CM | POA: Insufficient documentation

## 2020-05-12 LAB — URINALYSIS, MICROSCOPIC (REFLEX): Squamous Epithelial / HPF: NONE SEEN (ref 0–5)

## 2020-05-12 LAB — URINALYSIS, ROUTINE W REFLEX MICROSCOPIC
Bilirubin Urine: NEGATIVE
Glucose, UA: >=500 mg/dL — AB
Hgb urine dipstick: NEGATIVE
Ketones, ur: NEGATIVE mg/dL
Leukocytes,Ua: NEGATIVE
Nitrite: NEGATIVE
Protein, ur: NEGATIVE mg/dL
Specific Gravity, Urine: 1.01 (ref 1.005–1.030)
pH: 7.5 (ref 5.0–8.0)

## 2020-05-12 LAB — CBC
HCT: 42.9 % (ref 36.0–46.0)
Hemoglobin: 13.9 g/dL (ref 12.0–15.0)
MCH: 29.1 pg (ref 26.0–34.0)
MCHC: 32.4 g/dL (ref 30.0–36.0)
MCV: 89.9 fL (ref 80.0–100.0)
Platelets: 321 10*3/uL (ref 150–400)
RBC: 4.77 MIL/uL (ref 3.87–5.11)
RDW: 12.9 % (ref 11.5–15.5)
WBC: 11.7 10*3/uL — ABNORMAL HIGH (ref 4.0–10.5)
nRBC: 0 % (ref 0.0–0.2)

## 2020-05-12 LAB — BASIC METABOLIC PANEL
Anion gap: 11 (ref 5–15)
BUN: 6 mg/dL (ref 6–20)
CO2: 25 mmol/L (ref 22–32)
Calcium: 11 mg/dL — ABNORMAL HIGH (ref 8.9–10.3)
Chloride: 99 mmol/L (ref 98–111)
Creatinine, Ser: 0.66 mg/dL (ref 0.44–1.00)
GFR calc Af Amer: >60 mL/min (ref >60)
GFR calc non Af Amer: >60 mL/min (ref >60)
Glucose, Bld: 291 mg/dL — ABNORMAL HIGH (ref 70–99)
Potassium: 3.6 mmol/L (ref 3.5–5.1)
Sodium: 135 mmol/L (ref 135–145)

## 2020-05-12 LAB — TROPONIN I (HIGH SENSITIVITY): Troponin I (High Sensitivity): 7 ng/L (ref <18)

## 2020-05-12 MED ORDER — SODIUM CHLORIDE 0.9% FLUSH: 3.0000 mL | Freq: Once | INTRAVENOUS | Status: DC

## 2020-05-12 NOTE — ED Triage Notes (Signed)
Pt bib ems from home with reports of chest heaviness, dizziness and weakness for approx 1 week. Worsened today. Pt has not taken any medications X3 weeks due to insurance issues. Pt endorsed using cocaine last night. 20g LAC. Given 4mg  zofran en route due to some nausea. CBG 353 BP 162/118 100% RA HR 89

## 2020-05-15 ENCOUNTER — Ambulatory Visit (HOSPITAL_COMMUNITY): Payer: BC Managed Care – PPO

## 2020-05-17 ENCOUNTER — Ambulatory Visit (HOSPITAL_COMMUNITY): Payer: BC Managed Care – PPO

## 2020-05-19 ENCOUNTER — Ambulatory Visit (HOSPITAL_COMMUNITY): Payer: BC Managed Care – PPO

## 2020-05-22 ENCOUNTER — Ambulatory Visit (HOSPITAL_COMMUNITY): Payer: BC Managed Care – PPO

## 2020-05-24 ENCOUNTER — Ambulatory Visit (HOSPITAL_COMMUNITY): Payer: BC Managed Care – PPO

## 2020-05-26 ENCOUNTER — Ambulatory Visit (HOSPITAL_COMMUNITY): Payer: BC Managed Care – PPO

## 2020-05-29 ENCOUNTER — Ambulatory Visit (HOSPITAL_COMMUNITY): Payer: BC Managed Care – PPO

## 2020-05-31 ENCOUNTER — Ambulatory Visit (HOSPITAL_COMMUNITY): Payer: BC Managed Care – PPO

## 2020-05-31 ENCOUNTER — Ambulatory Visit: Payer: BC Managed Care – PPO | Admitting: Cardiology

## 2020-06-02 ENCOUNTER — Ambulatory Visit (HOSPITAL_COMMUNITY): Payer: BC Managed Care – PPO

## 2020-06-05 ENCOUNTER — Ambulatory Visit (HOSPITAL_COMMUNITY): Payer: BC Managed Care – PPO

## 2020-06-07 ENCOUNTER — Ambulatory Visit (HOSPITAL_COMMUNITY): Payer: BC Managed Care – PPO

## 2020-06-09 ENCOUNTER — Ambulatory Visit (HOSPITAL_COMMUNITY): Payer: BC Managed Care – PPO

## 2020-06-12 ENCOUNTER — Ambulatory Visit (HOSPITAL_COMMUNITY): Payer: BC Managed Care – PPO

## 2020-06-14 ENCOUNTER — Ambulatory Visit (HOSPITAL_COMMUNITY): Payer: BC Managed Care – PPO

## 2020-06-16 ENCOUNTER — Ambulatory Visit (HOSPITAL_COMMUNITY): Payer: BC Managed Care – PPO

## 2020-06-19 ENCOUNTER — Ambulatory Visit (HOSPITAL_COMMUNITY): Payer: BC Managed Care – PPO

## 2020-06-21 ENCOUNTER — Ambulatory Visit (HOSPITAL_COMMUNITY): Payer: BC Managed Care – PPO

## 2020-06-23 ENCOUNTER — Ambulatory Visit (HOSPITAL_COMMUNITY): Payer: BC Managed Care – PPO

## 2020-06-28 ENCOUNTER — Ambulatory Visit (HOSPITAL_COMMUNITY): Payer: BC Managed Care – PPO

## 2020-06-30 ENCOUNTER — Ambulatory Visit (HOSPITAL_COMMUNITY): Payer: BC Managed Care – PPO

## 2020-07-30 ENCOUNTER — Other Ambulatory Visit: Payer: Self-pay

## 2020-07-30 ENCOUNTER — Emergency Department (HOSPITAL_COMMUNITY)
Admission: EM | Admit: 2020-07-30 | Discharge: 2020-07-30 | Disposition: A | Discharge status: Home / Self Care | Payer: BC Managed Care – PPO | Attending: Emergency Medicine | Admitting: Emergency Medicine

## 2020-07-30 ENCOUNTER — Encounter (HOSPITAL_COMMUNITY): Payer: Self-pay

## 2020-07-30 ENCOUNTER — Emergency Department (HOSPITAL_COMMUNITY): Payer: BC Managed Care – PPO

## 2020-07-30 DIAGNOSIS — F1490 Cocaine use, unspecified, uncomplicated: Secondary | ICD-10-CM | POA: Diagnosis not present

## 2020-07-30 DIAGNOSIS — R0789 Other chest pain: Secondary | ICD-10-CM | POA: Diagnosis present

## 2020-07-30 DIAGNOSIS — I10 Essential (primary) hypertension: Secondary | ICD-10-CM | POA: Insufficient documentation

## 2020-07-30 DIAGNOSIS — I208 Other forms of angina pectoris: Secondary | ICD-10-CM

## 2020-07-30 DIAGNOSIS — Z79899 Other long term (current) drug therapy: Secondary | ICD-10-CM | POA: Diagnosis not present

## 2020-07-30 DIAGNOSIS — I25118 Atherosclerotic heart disease of native coronary artery with other forms of angina pectoris: Secondary | ICD-10-CM

## 2020-07-30 DIAGNOSIS — Z20822 Contact with and (suspected) exposure to covid-19: Secondary | ICD-10-CM | POA: Diagnosis not present

## 2020-07-30 DIAGNOSIS — R079 Chest pain, unspecified: Secondary | ICD-10-CM

## 2020-07-30 DIAGNOSIS — Z7984 Long term (current) use of oral hypoglycemic drugs: Secondary | ICD-10-CM | POA: Insufficient documentation

## 2020-07-30 DIAGNOSIS — F17200 Nicotine dependence, unspecified, uncomplicated: Secondary | ICD-10-CM | POA: Diagnosis not present

## 2020-07-30 DIAGNOSIS — E1140 Type 2 diabetes mellitus with diabetic neuropathy, unspecified: Secondary | ICD-10-CM | POA: Insufficient documentation

## 2020-07-30 LAB — RESPIRATORY PANEL BY RT PCR (FLU A&B, COVID)
Influenza A by PCR: NEGATIVE
Influenza B by PCR: NEGATIVE
SARS Coronavirus 2 by RT PCR: NEGATIVE

## 2020-07-30 LAB — CBC WITH DIFFERENTIAL/PLATELET
Abs Immature Granulocytes: 0.03 10*3/uL (ref 0.00–0.07)
Basophils Absolute: 0 10*3/uL (ref 0.0–0.1)
Basophils Relative: 0 %
Eosinophils Absolute: 0.1 10*3/uL (ref 0.0–0.5)
Eosinophils Relative: 1 %
HCT: 34.3 % — ABNORMAL LOW (ref 36.0–46.0)
Hemoglobin: 10.7 g/dL — ABNORMAL LOW (ref 12.0–15.0)
Immature Granulocytes: 0 %
Lymphocytes Relative: 32 %
Lymphs Abs: 2.3 10*3/uL (ref 0.7–4.0)
MCH: 28.8 pg (ref 26.0–34.0)
MCHC: 31.2 g/dL (ref 30.0–36.0)
MCV: 92.2 fL (ref 80.0–100.0)
Monocytes Absolute: 0.4 10*3/uL (ref 0.1–1.0)
Monocytes Relative: 5 %
Neutro Abs: 4.4 10*3/uL (ref 1.7–7.7)
Neutrophils Relative %: 62 %
Platelets: 135 10*3/uL — ABNORMAL LOW (ref 150–400)
RBC: 3.72 MIL/uL — ABNORMAL LOW (ref 3.87–5.11)
RDW: 12.7 % (ref 11.5–15.5)
WBC: 7.3 10*3/uL (ref 4.0–10.5)
nRBC: 0 % (ref 0.0–0.2)

## 2020-07-30 LAB — TROPONIN I (HIGH SENSITIVITY)
Troponin I (High Sensitivity): 3 ng/L (ref <18)
Troponin I (High Sensitivity): 4 ng/L (ref <18)

## 2020-07-30 LAB — COMPREHENSIVE METABOLIC PANEL
ALT: 12 U/L (ref 0–44)
AST: 24 U/L (ref 15–41)
Albumin: 2.9 g/dL — ABNORMAL LOW (ref 3.5–5.0)
Alkaline Phosphatase: 45 U/L (ref 38–126)
Anion gap: 11 (ref 5–15)
BUN: 7 mg/dL (ref 6–20)
CO2: 19 mmol/L — ABNORMAL LOW (ref 22–32)
Calcium: 8.8 mg/dL — ABNORMAL LOW (ref 8.9–10.3)
Chloride: 110 mmol/L (ref 98–111)
Creatinine, Ser: 0.52 mg/dL (ref 0.44–1.00)
GFR, Estimated: >60 mL/min (ref >60)
Glucose, Bld: 237 mg/dL — ABNORMAL HIGH (ref 70–99)
Potassium: 4.4 mmol/L (ref 3.5–5.1)
Sodium: 140 mmol/L (ref 135–145)
Total Bilirubin: 0.7 mg/dL (ref 0.3–1.2)
Total Protein: 5.3 g/dL — ABNORMAL LOW (ref 6.5–8.1)

## 2020-07-30 MED ORDER — ISOSORBIDE MONONITRATE ER 30 MG PO TB24
30.0000 mg | ORAL_TABLET | Freq: Every day | ORAL | Status: DC
  Administered 2020-07-30: 30 mg via ORAL
  Filled 2020-07-30: qty 1

## 2020-07-30 MED ORDER — NITROGLYCERIN 0.4 MG SL SUBL
0.4000 mg | SUBLINGUAL_TABLET | SUBLINGUAL | Status: DC | PRN
  Administered 2020-07-30: 0.4 mg via SUBLINGUAL
  Filled 2020-07-30: qty 1

## 2020-07-30 MED ORDER — ISOSORBIDE MONONITRATE ER 30 MG PO TB24: 30.0000 mg | ORAL_TABLET | Freq: Every day | ORAL | 0 refills | Status: DC

## 2020-07-30 MED ORDER — CLOPIDOGREL BISULFATE 300 MG PO TABS
300.0000 mg | ORAL_TABLET | Freq: Once | ORAL | Status: AC
  Administered 2020-07-30: 300 mg via ORAL
  Filled 2020-07-30: qty 1

## 2020-07-30 MED ORDER — CLOPIDOGREL BISULFATE 75 MG PO TABS: 75.0000 mg | ORAL_TABLET | Freq: Every day | ORAL | 0 refills | Status: AC

## 2020-07-30 MED ORDER — SODIUM CHLORIDE 0.9 % IV BOLUS
500.0000 mL | Freq: Once | INTRAVENOUS | Status: AC
  Administered 2020-07-30: 500 mL via INTRAVENOUS

## 2020-07-30 MED ORDER — CLOPIDOGREL BISULFATE 75 MG PO TABS
75.0000 mg | ORAL_TABLET | Freq: Every day | ORAL | Status: DC
  Filled 2020-07-30: qty 1

## 2020-07-30 NOTE — ED Provider Notes (Signed)
Emergency Department Provider Note   I have reviewed the triage vital signs and the nursing notes.   HISTORY  Chief Complaint Chest Pain   HPI Vanessa Case is a 49 y.o. female with past medical history reviewed below including CAD status post DES placement x1 earlier this year presents to the emergency department with return of her typical ACS chest pain.  She states yesterday she developed sharp left central chest discomfort which was brief and resolved without intervention.  Earlier this evening she had additional sharp pain but then has had lingering pressure in the center of her chest radiating to her back.  She is feeling lightheaded and fatigued.  She denies nausea, vomiting, diaphoresis.  Of note, patient has been out of her Brilinta for the past 3 weeks due to high cost of this medication.  She also tells me that while she is cut back on her cocaine use she does continue to use cocaine approximately once per week.  She last used 2 days ago.  She took nitroglycerin earlier with some improvement in her chest pain symptoms. Patient arrives by EMS.   Past Medical History:  Diagnosis Date  . Anemia   . Anxiety   . Arthritis   . Chronic abdominal pain   . Chronic back pain   . Chronic headache   . Chronic neck pain   . Depression   . Diabetes mellitus    adult onset dm-maintained on glipizide and metformin, pt states fasting glucose runs 110  . Fibromyalgia   . GERD (gastroesophageal reflux disease)   . Hyperlipidemia   . Hypertension    maintained on lisinopril hct, metoprolol-134/86 at pat visit  . Neuromuscular disorder (HCC)   . Neuropathy   . Obesity   . PTSD (post-traumatic stress disorder)   . Substance abuse [facility])     Patient Active Problem List   Diagnosis Date Noted  . Substance abuse (HCC) 02/08/2020  . NSTEMI (non-ST elevated myocardial infarction) (HCC) 02/05/2020  . Suicidal ideation 07/20/2015  . Major depressive disorder, recurrent, severe  without psychotic features (HCC)   . Type 2 diabetes mellitus with diabetic polyneuropathy (HCC) 05/30/2015  . Other fatigue 05/30/2015  . Essential hypertension 05/30/2015  . HLD (hyperlipidemia) 05/30/2015  . Bipolar 2 disorder, major depressive episode (HCC) 09/20/2014  . Cholelithiasis with acute cholecystitis 06/28/2013  . Right arm weakness 03/05/2013    Past Surgical History:  Procedure Laterality Date  . ABDOMINAL HYSTERECTOMY  10/30/2011   Procedure: HYSTERECTOMY ABDOMINAL;  Surgeon: Bing Plume, MD;  Location: [facility] ORS;  Service: Gynecology;  Laterality: N/A;  . CESAREAN SECTION  x3  . CHOLECYSTECTOMY N/A 06/28/2013   Procedure: LAPAROSCOPIC CHOLECYSTECTOMY;  Surgeon: Shelly Rubenstein, MD;  Location: [facility] OR;  Service: General;  Laterality: N/A;  . CORONARY STENT INTERVENTION N/A 02/07/2020   Procedure: CORONARY STENT INTERVENTION;  Surgeon: Marykay Lex, MD;  Location: [facility] INVASIVE CV LAB;  Service: Cardiovascular;  Laterality: N/A;  . LEFT HEART CATH AND CORONARY ANGIOGRAPHY N/A 02/07/2020   Procedure: LEFT HEART CATH AND CORONARY ANGIOGRAPHY;  Surgeon: Marykay Lex, MD;  Location: [facility] INVASIVE CV LAB;  Service: Cardiovascular;  Laterality: N/A;  . SALPINGOOPHORECTOMY  10/30/2011   Procedure: SALPINGO OOPHERECTOMY;  Surgeon: Bing Plume, MD;  Location: [facility] ORS;  Service: Gynecology;  Laterality: Right;  . WRIST SURGERY     carpel tunnel and tendonitis    Allergies Percocet [oxycodone-acetaminophen] and Ambien [zolpidem]  Family History  Problem Relation  Age of Onset  . Diabetes Mother   . Atrial fibrillation Mother   . Cystic fibrosis Father   . Diabetes Maternal Grandmother   . Heart disease Maternal Grandfather     Social History Social History   Tobacco Use  . Smoking status: Current Every Day Smoker  . Smokeless tobacco: Never Used  . Tobacco comment: has smoked for 14 years total - quit for 6 years at one point but has been smoking the last 3 years    Vaping Use  . Vaping Use: Never used  Substance Use Topics  . Alcohol use: Not Currently    Alcohol/week: 0.0 standard drinks  . Drug use: Yes    Types: Cocaine, Marijuana    Comment: last used cocaine 2 days ago and marijuana 1 day ago    Review of Systems  Constitutional: No fever/chills Eyes: No visual changes. ENT: No sore throat. Cardiovascular: Positive chest pain. Respiratory: Denies shortness of breath. Gastrointestinal: No abdominal pain.  No nausea, no vomiting.  No diarrhea.  No constipation. Genitourinary: Negative for dysuria. Musculoskeletal: Negative for back pain. Skin: Negative for rash. Neurological: Negative for headaches, focal weakness or numbness.  10-point ROS otherwise negative.  ____________________________________________   PHYSICAL EXAM:  VITAL SIGNS: ED Triage Vitals  Enc Vitals Group     BP 07/30/20 0525 135/83     Pulse Rate 07/30/20 0525 97     Resp 07/30/20 0525 18     Temp 07/30/20 0525 98.4 F (36.9 C)     Temp Source 07/30/20 0525 Oral     SpO2 07/30/20 0525 100 %     Weight 07/30/20 0523 162 lb (73.5 kg)     Height 07/30/20 0523 5\' 5"  (1.651 m)   Constitutional: Alert and oriented. Well appearing and in no acute distress. Eyes: Conjunctivae are normal.  Head: Atraumatic. Nose: No congestion/rhinnorhea. Mouth/Throat: Mucous membranes are moist.   Neck: No stridor.   Cardiovascular: Normal rate, regular rhythm. Good peripheral circulation. Grossly normal heart sounds.   Respiratory: Normal respiratory effort.  No retractions. Lungs CTAB. Gastrointestinal: Soft and nontender. No distention.  Musculoskeletal: No gross deformities of extremities. Neurologic:  Normal speech and language.  Skin:  Skin is warm, dry and intact. No rash noted.   ____________________________________________   LABS (all labs ordered are listed, but only abnormal results are displayed)  Labs Reviewed  COMPREHENSIVE METABOLIC PANEL - Abnormal;  Notable for the following components:      Result Value   CO2 19 (*)    Glucose, Bld 237 (*)    Calcium 8.8 (*)    Total Protein 5.3 (*)    Albumin 2.9 (*)    All other components within normal limits  CBC WITH DIFFERENTIAL/PLATELET - Abnormal; Notable for the following components:   RBC 3.72 (*)    Hemoglobin 10.7 (*)    HCT 34.3 (*)    Platelets 135 (*)    All other components within normal limits  RESPIRATORY PANEL BY RT PCR (FLU A&B, COVID)  TROPONIN I (HIGH SENSITIVITY)  TROPONIN I (HIGH SENSITIVITY)   ____________________________________________  EKG   EKG Interpretation  Date/Time:  Sunday July 30 2020 10-13-1990 EDT Ventricular Rate:  88 PR Interval:    QRS Duration: 74 QT Interval:  359 QTC Calculation: 435 R Axis:   54 Text Interpretation: Sinus rhythm No STEMI Confirmed by [id number] [phone]) on 07/30/2020 5:45:01 AM Also confirmed by 09/29/2020 [phone]), editor ([id number] Beverly [id number])  on 07/31/2020 6:51:05 AM  ____________________________________________  RADIOLOGY  CXR reviewed.  ____________________________________________   PROCEDURES  Procedure(s) performed:   Procedures  None  ____________________________________________   INITIAL IMPRESSION / ASSESSMENT AND PLAN / ED COURSE  Pertinent labs & imaging results that were available during my care of the patient were reviewed by me and considered in my medical decision making (see chart for details).   Patient presents to the emergency department with return of chest pain.  She has been without her Brilinta for the past 3 weeks due to cost issues.  Patient also continues to use cocaine with last use 2 days ago.  EKG shows no acute ischemic change.  Plan for labs and chest x-ray.  I sent Covid testing as well.  Patient had similar pain during her last admission with left heart cath and stent placement.  CTA at that time was negative for PE.  My overall suspicion for PE in this  situation is lower.   Labs and imaging reviewed. Care transferred to Dr. Dalene Seltzer.  ____________________________________________  FINAL CLINICAL IMPRESSION(S) / ED DIAGNOSES  Final diagnoses:  Chest pain, unspecified type     MEDICATIONS GIVEN DURING THIS VISIT:  Medications  sodium chloride 0.9 % bolus 500 mL (0 mLs Intravenous Stopped 07/30/20 0800)  clopidogrel (PLAVIX) tablet 300 mg (300 mg Oral Given 07/30/20 1237)     NEW OUTPATIENT MEDICATIONS STARTED DURING THIS VISIT:  Discharge Medication List as of 07/30/2020 12:28 PM    START taking these medications   Details  clopidogrel (PLAVIX) 75 MG tablet Take 1 tablet (75 mg total) by mouth daily., Starting Sun 07/30/2020, Until Tue 08/29/2020, Normal    isosorbide mononitrate (IMDUR) 30 MG 24 hr tablet Take 1 tablet (30 mg total) by mouth daily., Starting Sun 07/30/2020, Normal        Note:  This document was prepared using Dragon voice recognition software and may include unintentional dictation errors.  Alona Bene, MD, [facility]    Judea Riches, Arlyss Repress, MD 08/02/20 [phone]

## 2020-07-30 NOTE — ED Triage Notes (Signed)
PT coming from home with EMS after waking up with LT sided sharp CP. Feels similar to past MI's. Pt reports back pain yesterday and took 1 sl nitro. Pt received 1 SL nitro PTA.

## 2020-07-30 NOTE — Consult Note (Addendum)
Cardiology Consultation:   Patient ID: Vanessa Case; [id number]; December 22, 1970   Admit date: 07/30/2020 Date of Consult: 07/30/2020  Primary Care Provider: [facility] Primary Cardiologist: Vanessa Red, MD   Patient Profile:   Vanessa Case is a 49 y.o. female with a hx of CAD s/p NSTEMI (01/2020) treated with DES to LCx, hypertension, hyperlipidemia, diabetes mellitus with neuropathy, anemia, anxiety/depression, borderline personality disorder, PTSD,  fibromyalgia, chronic pain, and episodic cocaine abuse who is being seen today for the evaluation of chest pain at the request of Dr. Jacqulyn Case.   History of Present Illness:   Vanessa Case is a 49yo F with a hx as stated above who presented to [facility] 07/30/20 with complaints of chest pain. Pt reports that she has not felt well in at least two months, more specifically after receiving her COVID vaccination. She states that over the last week she has been experiencing symptoms which began yesterday with persistent back pain for which she took Tylenol without relief. She then began to have sharp, anterior chest pain which is described as "an electric shock" that lasted for approximately 15 seconds then improved but she still had residual chest pressure. She states that she then had a recurrent episode early this AM at around 4am prompting her to call EMS for further evaluation. She reports that she did take SL NTG with some relief. There seem to be no exertional components. Since her stent placement she has been complaint with ASA and Brilinta until several weeks ago when she ran out secondary to cost. Given her recurrent symptoms, she called EMS for further evaluation.   On ED arrival, hsT found to be negative at 3 with pending repeat results. Covid test negative. EKG with NSR, HR 88 bpm with evidence of prior anterior MI and no acute changes. CXR with no active cardiopulmonary disease.   She was admitted 02/05/2020 to  02/08/2020 with NSTEMI.  Echocardiogram at that time showed an LVEF of 55 to 60% with normal wall motion and mild LVH.  LHC showed a 99% stenosis of the proximal to mid LCx which was felt to be the culprit lesion and treated with DES stenting.  Otherwise, nonobstructive CAD.  She was started on DAPT with ASA and Brilinta.  No beta-blocker was initiated due to cocaine use.  She was started on diltiazem 120 mg daily instead.  Atorvastatin was increased to 80 mg daily.  She was most recently seen in follow-up 02/18/2020 and was felt to be doing well from a CV standpoint.  She did have 1 episode of chest pain while eating breakfast on day of evaluation at which time she took 2 doses of sublingual nitroglycerin with pain resolution.  She felt symptoms were secondary to GERD.  She was compliant with her medications.  Plan was for follow-up in 3 months however she has not been seen since this time.  Past Medical History:  Diagnosis Date   Anemia    Anxiety    Arthritis    Chronic abdominal pain    Chronic back pain    Chronic headache    Chronic neck pain    Depression    Diabetes mellitus    adult onset dm-maintained on glipizide and metformin, pt states fasting glucose runs 110   Fibromyalgia    GERD (gastroesophageal reflux disease)    Hyperlipidemia    Hypertension    maintained on lisinopril hct, metoprolol-134/86 at pat visit   Neuromuscular disorder (HCC)  Neuropathy    Obesity    PTSD (post-traumatic stress disorder)    Substance abuse [facility](HCC)     Past Surgical History:  Procedure Laterality Date   ABDOMINAL HYSTERECTOMY  10/30/2011   Procedure: HYSTERECTOMY ABDOMINAL;  Surgeon: Vanessa Plumehomas F Henley, MD;  Location: [facility];  Service: Gynecology;  Laterality: N/A;   CESAREAN SECTION  x3   CHOLECYSTECTOMY N/A 06/28/2013   Procedure: LAPAROSCOPIC CHOLECYSTECTOMY;  Surgeon: Vanessa Rubensteinouglas A Blackman, MD;  Location: [facility] OR;  Service: General;  Laterality: N/A;   CORONARY STENT INTERVENTION N/A 02/07/2020     Procedure: CORONARY STENT INTERVENTION;  Surgeon: Vanessa Case, Vanessa W, MD;  [facility];  Service: Cardiovascular;  Laterality: N/A;   LEFT HEART CATH AND CORONARY ANGIOGRAPHY N/A 02/07/2020   Procedure: LEFT HEART CATH AND CORONARY ANGIOGRAPHY;  Surgeon: Vanessa Case, Vanessa W, MD;  [facility];  Service: Cardiovascular;  Laterality: N/A;   SALPINGOOPHORECTOMY  10/30/2011   Procedure: SALPINGO OOPHERECTOMY;  Surgeon: Vanessa Plumehomas F Henley, MD;  Location: [facility];  Service: Gynecology;  Laterality: Right;   WRIST SURGERY     carpel tunnel and tendonitis     Prior to Admission medications   Medication Sig Start Date End Date Taking? Authorizing Provider  acetaminophen (TYLENOL) 500 MG tablet Take 1,000 mg by mouth daily as needed for mild pain.    [provider]  aspirin EC 81 MG EC tablet Take 1 tablet (81 mg total) by mouth daily. 02/09/20   Vanessa Case, Vanessa B, NP  atorvastatin (LIPITOR) 80 MG tablet Take 1 tablet (80 mg total) by mouth daily. 02/09/20   Vanessa Case, Vanessa B, NP  dicyclomine (BENTYL) 10 MG capsule Take 1 capsule (10 mg total) by mouth 4 (four) times daily -  before meals and at bedtime. 01/15/18   Vanessa Case, Vanessa T, MD  diltiazem (CARDIZEM CD) 180 MG 24 hr capsule Take 1 capsule (180 mg total) by mouth daily. 02/18/20 05/18/20  Vanessa Case, Vanessa E, PA-C  FLUoxetine (PROZAC) 40 MG capsule Take 40 mg by mouth daily. 12/22/19   [provider]  fluticasone (FLOVENT HFA) 44 MCG/ACT inhaler Inhale 2 puffs into the lungs 2 (two) times daily as needed (for shortness of breath). 03/14/16   Vanessa Case, Nathan, MD  gabapentin (NEURONTIN) 300 MG capsule Take 300 mg by mouth 2 (two) times daily. 12/22/19   [provider]  metFORMIN (GLUCOPHAGE) 500 MG tablet Take 500 mg by mouth 2 (two) times daily. 12/22/19   [provider]  nicotine polacrilex (NICORETTE) 4 MG gum Take 1 each (4 mg total) by mouth as needed for smoking cessation. Chew 1 piece every  2 hours as needed; no more than 24 pieces a day 02/18/20   Vanessa Case, Vanessa E, PA-C  nitroGLYCERIN (NITROSTAT) 0.4 MG SL tablet Place 1 tablet (0.4 mg total) under the tongue every 5 (five) minutes x 3 doses as needed for chest pain. 02/08/20   Vanessa Case, Vanessa B, NP  omeprazole (PRILOSEC) 20 MG capsule Take 1 capsule (20 mg total) by mouth daily. 04/08/18   Hedges, Tinnie GensJeffrey, PA-C  OZEMPIC, 0.25 OR 0.5 MG/DOSE, 2 MG/1.5ML SOPN Inject 0.5 mg into the skin every Saturday. 01/26/20   [provider]  prazosin (MINIPRESS) 1 MG capsule Take 1 capsule (1 mg total) by mouth at bedtime. To prevent nightmares 07/25/15   Withrow, Everardo AllJohn C, FNP  QUEtiapine (SEROQUEL) 50 MG tablet Take 50 mg by mouth at bedtime.    [provider]  ticagrelor (BRILINTA) 90  MG TABS tablet Take 1 tablet (90 mg total) by mouth 2 (two) times daily. 02/08/20   Vanessa Baumgartner, NP  traMADol (ULTRAM) 50 MG tablet Take 1 tablet by mouth 2 (two) times daily as needed for pain. 12/22/19   [provider]  traZODone (DESYREL) 50 MG tablet Take 50 mg by mouth at bedtime. 01/16/20   [provider]    Inpatient Medications: Scheduled Meds:   Continuous Infusions:   PRN Meds: nitroGLYCERIN  Allergies:    Allergies  Allergen Reactions   Percocet [Oxycodone-Acetaminophen] Hives, Itching, Palpitations and Other (See Comments)    nightmares   Ambien [Zolpidem] Other (See Comments)    Sleepwalking episodes    Social History:   Social History   Socioeconomic History   Marital status: Married    Spouse name: Not on file   Number of children: 1   Years of education: Not on file   Highest education level: Not on file  Occupational History   Not on file  Tobacco Use   Smoking status: Current Every Day Smoker   Smokeless tobacco: Never Used   Tobacco comment: has smoked for 14 years total - quit for 6 years at one point but has been smoking the last 3 years  Vaping Use   Vaping Use: Never used   Substance and Sexual Activity   Alcohol use: Not Currently    Alcohol/week: 0.0 standard drinks   Drug use: Yes    Types: Cocaine, Marijuana    Comment: last used cocaine 2 days ago and marijuana 1 day ago   Sexual activity: Not on file  Other Topics Concern   Not on file  Social History Narrative   Not on file   Social Determinants of Health   Financial Resource Strain:    Difficulty of Paying Living Expenses: Not on file  Food Insecurity:    Worried About Running Out of Food in the Last Year: Not on file   [employer] of Food in the Last Year: Not on file  Transportation Needs:    Lack of Transportation (Medical): Not on file   Lack of Transportation (Non-Medical): Not on file  Physical Activity:    Days of Exercise per Week: Not on file   Minutes of Exercise per Session: Not on file  Stress:    Feeling of Stress : Not on file  Social Connections:    Frequency of Communication with Friends and Family: Not on file   Frequency of Social Gatherings with Friends and Family: Not on file   Attends Religious Services: Not on file   Active Member of Clubs or Organizations: Not on file   Attends Banker Meetings: Not on file   Marital Status: Not on file  Intimate Partner Violence:    Fear of Current or Ex-Partner: Not on file   Emotionally Abused: Not on file   Physically Abused: Not on file   Sexually Abused: Not on file    Family History:   Family History  Problem Relation Age of Onset   Diabetes Mother    Atrial fibrillation Mother    Cystic fibrosis Father    Diabetes Maternal Grandmother    Heart disease Maternal Grandfather    Family Status:  Family Status  Relation Name Status   Mother  Alive   Father  Deceased   MGM  (Not Specified)   MGF  (Not Specified)    ROS:  Please see the history of present illness.  All other ROS reviewed and negative.     Physical Exam/Data:   Vitals:   07/30/20 0745 07/30/20 0800 07/30/20 0815 07/30/20 0830  BP:  (!) 145/78 (!) 141/76 140/78 139/77  Pulse: 71 76 79 87  Resp: 19 (!) 21 19 18   Temp:      TempSrc:      SpO2: 100% 100% 99% 100%  Weight:      Height:       No intake or output data in the 24 hours ending 07/30/20 0846 Filed Weights   07/30/20 0523  Weight: 73.5 kg   Body mass index is 26.96 kg/m.   General: Well developed, well nourished, NAD Neck: Negative for carotid bruits. No JVD Lungs:Clear to ausculation bilaterally. No wheezes, rales, or rhonchi. Breathing is unlabored. Cardiovascular: RRR with S1 S2. No murmurs Abdomen: Soft, non-tender, non-distended. No obvious abdominal masses. Extremities: No edema. Radial pulses 2+ bilaterally Neuro: Alert and oriented. No focal deficits. No facial asymmetry. MAE spontaneously. Psych: Responds to questions appropriately with normal affect.    EKG:  The EKG was personally reviewed and demonstrates: 07/30/20 NSR with HR 88bpm and evidence of old MI and no acute changes  Telemetry:  Telemetry was personally reviewed and demonstrates: 07/30/20 NSR with HR 70-90's   Relevant CV Studies:  Echocardiogram 02/06/2020: Impressions: 1. Left ventricular ejection fraction, by estimation, is 55 to 60%. The  left ventricle has normal function. There is mild left ventricular  hypertrophy. Left ventricular diastolic parameters are indeterminate.   2. Right ventricular systolic function is normal. The right ventricular  size is normal. Tricuspid regurgitation signal is inadequate for assessing  PA pressure.   3. Left atrial size was mildly dilated.   4. The mitral valve is normal in structure. No evidence of mitral valve  regurgitation.   5. The aortic valve is tricuspid. Aortic valve regurgitation is not  visualized. Mild to moderate aortic valve sclerosis/calcification is  present, without any evidence of aortic stenosis.   6. The inferior vena cava is normal in size with greater than 50%  respiratory variability, suggesting right atrial  pressure of 3 mmHg. _______________   Left Heart Catheterization 02/07/2020: CULPRIT LESION: Prox Cx to Mid Cx lesion is 99% stenosed. A drug-eluting stent was successfully placed using a STENT RESOLUTE ONYX 2.5X12. - post-dilated to 2.7 mm Post intervention, there is a 0% residual stenosis. --------------------- Prox LAD to Mid LAD lesion is 20% stenosed with 30% stenosed side branch in 1st Diag. Dist LAD lesion is 60% stenosed. Focal Lesion - recommend Med Rx unless Sx progress. Mid Cx lesion is 20% stenosed. --------------------- LV end diastolic pressure is normal.   Summary: Severe Single Vessel CAD - CULPRIT LESION mLCx 99% subtotalled / thrombotic stenosis Successful DES PCI of mCx - Resolute Onyx DES 2.5 mm x 12 mm (2.7 mm) Moderate m-distal LAD focal 60% in bend. Otherwise minmal CAD elsewhere. Normal LVEDP  Diagnostic Dominance: Right  Intervention      Laboratory Data:  Chemistry Recent Labs  Lab 07/30/20 0614  NA 140  K 4.4  CL 110  CO2 19*  GLUCOSE 237*  BUN 7  CREATININE 0.52  CALCIUM 8.8*  GFRNONAA >60  ANIONGAP 11    Total Protein  Date Value Ref Range Status  07/30/2020 5.3 (L) 6.5 - 8.1 g/dL Final   Albumin  Date Value Ref Range Status  07/30/2020 2.9 (L) 3.5 - 5.0 g/dL Final   AST  Date Value Ref Range Status  07/30/2020  24 15 - 41 U/L Final   ALT  Date Value Ref Range Status  07/30/2020 12 0 - 44 U/L Final   Alkaline Phosphatase  Date Value Ref Range Status  07/30/2020 45 38 - 126 U/L Final   Total Bilirubin  Date Value Ref Range Status  07/30/2020 0.7 0.3 - 1.2 mg/dL Final   Hematology Recent Labs  Lab 07/30/20 0614  WBC 7.3  RBC 3.72*  HGB 10.7*  HCT 34.3*  MCV 92.2  MCH 28.8  MCHC 31.2  RDW 12.7  PLT 135*   Cardiac EnzymesNo results for input(s): TROPONINI in the last 168 hours. No results for input(s): TROPIPOC in the last 168 hours.  BNPNo results for input(s): BNP, PROBNP in the last 168 hours.  DDimer No  results for input(s): DDIMER in the last 168 hours. TSH:  Lab Results  Component Value Date   TSH 0.556 02/05/2020   Lipids: Lab Results  Component Value Date   CHOL 181 02/06/2020   HDL 40 (L) 02/06/2020   LDLCALC 101 (H) 02/06/2020   TRIG 201 (H) 02/06/2020   CHOLHDL 4.5 02/06/2020   HgbA1c: Lab Results  Component Value Date   HGBA1C 9.2 (H) 02/05/2020    Radiology/Studies:  DG Chest Portable 1 View  Result Date: 07/30/2020 CLINICAL DATA:  Chest pain EXAM: PORTABLE CHEST 1 VIEW COMPARISON:  March 24, 2020 FINDINGS: The heart size and mediastinal contours are within normal limits. Both lungs are clear. The visualized skeletal structures are unremarkable. IMPRESSION: No active disease. Electronically Signed   By: Sherian Rein M.D.   On: 07/30/2020 08:20   Assessment and Plan:   1.  Chest pain with known CAD s/p NSTEMI with DES/PCI to LCx 01/2020: -Pt presented to [facility] 07/30/20 with complaints of chest pain. Pt reports that she has not felt well in at least two months, more specifically after receiving her COVID vaccination. She states that over the last week she has been experiencing chest pain which began yesterday with persistent back pain for which she took Tylenol with out relief. She then began to have sharp, anterior chest pain which is described as "an electric shock" that lasted for approximately 15 seconds then improved but she still had residual chest pressure. She states that she then had a recurrent episode early this AM at around 4am prompting her to call EMS for further evaluation. She reports that she did take SL NTG with some relief.  -Since her stent placement she has been complaint with ASA and Brilinta until several weeks ago when she ran out secondary to cost. -hsT found to be negative at 3 with pending repeat results.   -Covid test negative -EKG with NSR, HR 88 bpm with evidence of prior anterior MI and no acute changes. -Given poor compliance due to cost for the  last several weeks, there is concern for small re-occlusion. She has had several ED evaluations for chest pain which has resulted in a negative workup. At this time, would recommend adding IMDUR 30mg  PO QD and transition Brilinta to Plavix >>>needs re-load at 300mg  PO x1 then 75mg  QD -If hsT trend up, may keep for observation for further workup, otherwise will plan for OP evaluation.  2.  HTN: -Stable, 166/100>134/78>148/119 -No beta-blocker in the setting of cocaine use -Continue diltiazem 180 mg -Consider the addition of losartan -Add IMDUR 30mg  PO QD   3.  HLD: -Last LDL, 101 on 02/06/2020 with LDL goal <70 -Continue high intensity atorvastatin  4.  DM2: -Most recent hemoglobin A1c, 11.0 per patient report  -Needs close follow up with PCP for further management   5.  Polysubstance abuse: -Patient has known history of tobacco, marijuana and cocaine use -Discussed cessation -Pt attempting to quite   For questions or updates, please contact [facility] Please consult [email or web address] for contact info under Cardiology/STEMI.   Raliegh Ip NP-C [facility] Pager: [phone] 07/30/2020 8:46 AM  Personally seen and examined. Agree with APP Provider above with the following comments:  49 yo F with CAD and Prior Cx Stent with intermittent angina in the setting of cocaine use - Currently CP free - no STEMI or evidence of ACS presently - will return 2nd antiplatelet with plavix load - will start nitro - given unique risks of dissection with active cocaine use, will attempt conservative strategy.   - if no evidence of ACS with second troponin, outpatient mgmt is reasonable - Patient is amenable to this plan - will need early outpatient f/u  Christell Constant, MD

## 2020-07-31 NOTE — ED Provider Notes (Signed)
  Physical Exam  BP (!) 147/78   Pulse 74   Temp 98.4 F (36.9 C) (Oral)   Resp 12   Ht 5\' 5"  (1.651 m)   Wt 73.5 kg   LMP 10/07/2011   SpO2 100%   BMI 26.96 kg/m   Physical Exam  ED Course/Procedures     Procedures  MDM  48yo female presents with concern for chest pain. Received care from Dr. 10/09/2011 see his notes fro prior hx, physicial and care. Briefly, this is a 49yo female with hx of CAD who is presenting with chest pain.  Delta troponins negative.  Cardiology consulted. Recommend transition to plavix, taking imdurm.      49yo, MD 07/31/20 2229

## 2020-08-07 ENCOUNTER — Ambulatory Visit: Payer: BC Managed Care – PPO | Admitting: Cardiology

## 2020-12-31 ENCOUNTER — Emergency Department (HOSPITAL_COMMUNITY): Payer: BC Managed Care – PPO

## 2020-12-31 ENCOUNTER — Emergency Department (HOSPITAL_COMMUNITY)
Admission: EM | Admit: 2020-12-31 | Discharge: 2020-12-31 | Disposition: A | Discharge status: Home / Self Care | Payer: BC Managed Care – PPO | Attending: Emergency Medicine | Admitting: Emergency Medicine

## 2020-12-31 ENCOUNTER — Encounter (HOSPITAL_COMMUNITY): Payer: Self-pay | Admitting: Emergency Medicine

## 2020-12-31 ENCOUNTER — Other Ambulatory Visit: Payer: Self-pay

## 2020-12-31 DIAGNOSIS — Z7982 Long term (current) use of aspirin: Secondary | ICD-10-CM | POA: Insufficient documentation

## 2020-12-31 DIAGNOSIS — Z955 Presence of coronary angioplasty implant and graft: Secondary | ICD-10-CM | POA: Diagnosis not present

## 2020-12-31 DIAGNOSIS — E1142 Type 2 diabetes mellitus with diabetic polyneuropathy: Secondary | ICD-10-CM | POA: Diagnosis not present

## 2020-12-31 DIAGNOSIS — F17200 Nicotine dependence, unspecified, uncomplicated: Secondary | ICD-10-CM | POA: Diagnosis not present

## 2020-12-31 DIAGNOSIS — R0789 Other chest pain: Secondary | ICD-10-CM | POA: Diagnosis not present

## 2020-12-31 DIAGNOSIS — Z79899 Other long term (current) drug therapy: Secondary | ICD-10-CM | POA: Insufficient documentation

## 2020-12-31 DIAGNOSIS — B349 Viral infection, unspecified: Secondary | ICD-10-CM | POA: Insufficient documentation

## 2020-12-31 DIAGNOSIS — R202 Paresthesia of skin: Secondary | ICD-10-CM | POA: Insufficient documentation

## 2020-12-31 DIAGNOSIS — I10 Essential (primary) hypertension: Secondary | ICD-10-CM | POA: Insufficient documentation

## 2020-12-31 DIAGNOSIS — Z20822 Contact with and (suspected) exposure to covid-19: Secondary | ICD-10-CM | POA: Diagnosis not present

## 2020-12-31 DIAGNOSIS — Z7984 Long term (current) use of oral hypoglycemic drugs: Secondary | ICD-10-CM | POA: Insufficient documentation

## 2020-12-31 DIAGNOSIS — R519 Headache, unspecified: Secondary | ICD-10-CM | POA: Diagnosis present

## 2020-12-31 LAB — BASIC METABOLIC PANEL
Anion gap: 9 (ref 5–15)
BUN: <5 mg/dL — ABNORMAL LOW (ref 6–20)
CO2: 24 mmol/L (ref 22–32)
Calcium: 10.7 mg/dL — ABNORMAL HIGH (ref 8.9–10.3)
Chloride: 103 mmol/L (ref 98–111)
Creatinine, Ser: 0.55 mg/dL (ref 0.44–1.00)
GFR, Estimated: >60 mL/min (ref >60)
Glucose, Bld: 181 mg/dL — ABNORMAL HIGH (ref 70–99)
Potassium: 3.4 mmol/L — ABNORMAL LOW (ref 3.5–5.1)
Sodium: 136 mmol/L (ref 135–145)

## 2020-12-31 LAB — URINALYSIS, ROUTINE W REFLEX MICROSCOPIC
Bacteria, UA: NONE SEEN
Bilirubin Urine: NEGATIVE
Glucose, UA: 50 mg/dL — AB
Hgb urine dipstick: NEGATIVE
Ketones, ur: NEGATIVE mg/dL
Nitrite: NEGATIVE
Protein, ur: NEGATIVE mg/dL
Specific Gravity, Urine: 1.005 (ref 1.005–1.030)
pH: 7 (ref 5.0–8.0)

## 2020-12-31 LAB — CBC
HCT: 39.5 % (ref 36.0–46.0)
Hemoglobin: 13.7 g/dL (ref 12.0–15.0)
MCH: 29.8 pg (ref 26.0–34.0)
MCHC: 34.7 g/dL (ref 30.0–36.0)
MCV: 85.9 fL (ref 80.0–100.0)
Platelets: 374 10*3/uL (ref 150–400)
RBC: 4.6 MIL/uL (ref 3.87–5.11)
RDW: 12.8 % (ref 11.5–15.5)
WBC: 10.4 10*3/uL (ref 4.0–10.5)
nRBC: 0 % (ref 0.0–0.2)

## 2020-12-31 LAB — RESP PANEL BY RT-PCR (FLU A&B, COVID) ARPGX2
Influenza A by PCR: NEGATIVE
Influenza B by PCR: NEGATIVE
SARS Coronavirus 2 by RT PCR: NEGATIVE

## 2020-12-31 LAB — I-STAT BETA HCG BLOOD, ED (MC, WL, AP ONLY): I-stat hCG, quantitative: <5 m[IU]/mL (ref <5)

## 2020-12-31 LAB — POC SARS CORONAVIRUS 2 AG -  ED: SARS Coronavirus 2 Ag: NEGATIVE

## 2020-12-31 LAB — TROPONIN I (HIGH SENSITIVITY)
Troponin I (High Sensitivity): 3 ng/L (ref <18)
Troponin I (High Sensitivity): 4 ng/L (ref <18)

## 2020-12-31 LAB — CBG MONITORING, ED: Glucose-Capillary: 144 mg/dL — ABNORMAL HIGH (ref 70–99)

## 2020-12-31 MED ORDER — BENZONATATE 100 MG PO CAPS: 100.0000 mg | ORAL_CAPSULE | Freq: Three times a day (TID) | ORAL | 0 refills | Status: DC

## 2020-12-31 MED ORDER — SODIUM CHLORIDE 0.9 % IV BOLUS
1000.0000 mL | Freq: Once | INTRAVENOUS | Status: AC
  Administered 2020-12-31: 1000 mL via INTRAVENOUS

## 2020-12-31 MED ORDER — ONDANSETRON HCL 4 MG/2ML IJ SOLN
4.0000 mg | Freq: Once | INTRAMUSCULAR | Status: AC
  Administered 2020-12-31: 4 mg via INTRAVENOUS
  Filled 2020-12-31: qty 2

## 2020-12-31 MED ORDER — ONDANSETRON 4 MG PO TBDP: 4.0000 mg | ORAL_TABLET | Freq: Three times a day (TID) | ORAL | 0 refills | Status: DC | PRN

## 2020-12-31 NOTE — Discharge Instructions (Addendum)
Your workup was reassuring at this time. Please pick up medications and take as needed for nausea and cough. Drink plenty of fluids to stay hydrated and try to eat small meals throughout the day as your body needs nutrients. If your blood sugar gets too low you may feel worse.   Please follow up with your PCP regarding your ED visit. They should see you  by the end of this week for reevaluation.   Return to the ED IMMEDIATELY for any worsening symptoms

## 2020-12-31 NOTE — ED Triage Notes (Signed)
C/o pressure to center of chest, SOB, L arm numbness, and loss of taste since Thursday.  No arm drift.

## 2020-12-31 NOTE — ED Notes (Signed)
Patient verbalized understanding of discharge instructions. Opportunity for questions and answers.  

## 2020-12-31 NOTE — ED Provider Notes (Signed)
[facility] EMERGENCY DEPARTMENT Provider Note   CSN: [id number] Arrival date & time: 12/31/20  1630     History Chief Complaint  Patient presents with  . Chest Pain    Vanessa Case is a 50 y.o. female who presents to the ED today with complaint of gradual onset, constant, diffuse chest heaviness that began 4 days ago. Pt also complains of body aches, headache, dyspnea on exertion, fatigue, chills, loss of taste, nausea, diarrhea, as well as a tingling sensation in her left arm. Pt has hx of NSTEMI in the past however states this feels completley different. Pt states she has felt so bad she has not been taking her medications or eating and states that her blood sugars have been low. She is concerned she could have COVID today and wants to make sure she is okay because she does not want to get her husband or daughter sick. Pt has been vaccinated x 2 with last dose in May; no booster. Denies fevers, vision changes, neck stiffness, rash, vomiting, abdominal pain, or any other associated symptoms.   The history is provided by the patient and medical records.       Past Medical History:  Diagnosis Date  . Anemia   . Anxiety   . Arthritis   . Chronic abdominal pain   . Chronic back pain   . Chronic headache   . Chronic neck pain   . Depression   . Diabetes mellitus    adult onset dm-maintained on glipizide and metformin, pt states fasting glucose runs 110  . Fibromyalgia   . GERD (gastroesophageal reflux disease)   . Hyperlipidemia   . Hypertension    maintained on lisinopril hct, metoprolol-134/86 at pat visit  . Neuromuscular disorder (HCC)   . Neuropathy   . Obesity   . PTSD (post-traumatic stress disorder)   . Substance abuse [facility])     Patient Active Problem List   Diagnosis Date Noted  . Substance abuse (HCC) 02/08/2020  . NSTEMI (non-ST elevated myocardial infarction) (HCC) 02/05/2020  . Suicidal ideation 07/20/2015  . Major depressive  disorder, recurrent, severe without psychotic features (HCC)   . Type 2 diabetes mellitus with diabetic polyneuropathy (HCC) 05/30/2015  . Other fatigue 05/30/2015  . Essential hypertension 05/30/2015  . HLD (hyperlipidemia) 05/30/2015  . Bipolar 2 disorder, major depressive episode (HCC) 09/20/2014  . Cholelithiasis with acute cholecystitis 06/28/2013  . Right arm weakness 03/05/2013    Past Surgical History:  Procedure Laterality Date  . ABDOMINAL HYSTERECTOMY  10/30/2011   Procedure: HYSTERECTOMY ABDOMINAL;  Surgeon: Bing Plume, MD;  Location: [facility] ORS;  Service: Gynecology;  Laterality: N/A;  . CESAREAN SECTION  x3  . CHOLECYSTECTOMY N/A 06/28/2013   Procedure: LAPAROSCOPIC CHOLECYSTECTOMY;  Surgeon: Shelly Rubenstein, MD;  Location: [facility] OR;  Service: General;  Laterality: N/A;  . CORONARY STENT INTERVENTION N/A 02/07/2020   Procedure: CORONARY STENT INTERVENTION;  Surgeon: Marykay Lex, MD;  Location: [facility] INVASIVE CV LAB;  Service: Cardiovascular;  Laterality: N/A;  . LEFT HEART CATH AND CORONARY ANGIOGRAPHY N/A 02/07/2020   Procedure: LEFT HEART CATH AND CORONARY ANGIOGRAPHY;  Surgeon: Marykay Lex, MD;  Location: [facility] INVASIVE CV LAB;  Service: Cardiovascular;  Laterality: N/A;  . SALPINGOOPHORECTOMY  10/30/2011   Procedure: SALPINGO OOPHERECTOMY;  Surgeon: Bing Plume, MD;  Location: [facility] ORS;  Service: Gynecology;  Laterality: Right;  . WRIST SURGERY     carpel tunnel and tendonitis     OB  History    Gravida  3   Para  3   Term  3   Preterm      AB      Living  3     SAB      IAB      Ectopic      Multiple      Live Births              Family History  Problem Relation Age of Onset  . Diabetes Mother   . Atrial fibrillation Mother   . Cystic fibrosis Father   . Diabetes Maternal Grandmother   . Heart disease Maternal Grandfather     Social History   Tobacco Use  . Smoking status: Current Every Day Smoker  . Smokeless tobacco: Never Used   . Tobacco comment: has smoked for 14 years total - quit for 6 years at one point but has been smoking the last 3 years  Vaping Use  . Vaping Use: Never used  Substance Use Topics  . Alcohol use: Not Currently    Alcohol/week: 0.0 standard drinks  . Drug use: Yes    Types: Cocaine, Marijuana    Comment: last used cocaine 2 days ago and marijuana 1 day ago    Home Medications Prior to Admission medications   Medication Sig Start Date End Date Taking? Authorizing Provider  benzonatate (TESSALON) 100 MG capsule Take 1 capsule (100 mg total) by mouth every 8 (eight) hours. 12/31/20  Yes Venter, Margaux, PA-C  ondansetron (ZOFRAN ODT) 4 MG disintegrating tablet Take 1 tablet (4 mg total) by mouth every 8 (eight) hours as needed for nausea or vomiting. 12/31/20  Yes Hyman Hopes, Margaux, PA-C  acetaminophen (TYLENOL) 500 MG tablet Take 1,000 mg by mouth daily as needed for mild pain.    [provider]  aspirin EC 81 MG EC tablet Take 1 tablet (81 mg total) by mouth daily. 02/09/20   Arty Baumgartner, NP  atorvastatin (LIPITOR) 80 MG tablet Take 1 tablet (80 mg total) by mouth daily. 02/09/20   Arty Baumgartner, NP  dicyclomine (BENTYL) 10 MG capsule Take 1 capsule (10 mg total) by mouth 4 (four) times daily -  before meals and at bedtime. 01/15/18   Meryl Dare, MD  diltiazem (CARDIZEM CD) 180 MG 24 hr capsule Take 1 capsule (180 mg total) by mouth daily. 02/18/20 07/30/20  Marjie Skiff E, PA-C  FLUoxetine (PROZAC) 40 MG capsule Take 40 mg by mouth daily. 12/22/19   [provider]  fluticasone (FLOVENT HFA) 44 MCG/ACT inhaler Inhale 2 puffs into the lungs 2 (two) times daily as needed (for shortness of breath). 03/14/16   Benjiman Core, MD  gabapentin (NEURONTIN) 300 MG capsule Take 300 mg by mouth 2 (two) times daily. 12/22/19   [provider]  hydrOXYzine (ATARAX/VISTARIL) 25 MG tablet Take 25 mg by mouth daily. 07/19/20   [provider]  isosorbide  mononitrate (IMDUR) 30 MG 24 hr tablet Take 1 tablet (30 mg total) by mouth daily. 07/30/20   Alvira Monday, MD  metFORMIN (GLUCOPHAGE) 500 MG tablet Take 500 mg by mouth 2 (two) times daily. 12/22/19   [provider]  Multiple Vitamins-Calcium (ONE-A-DAY WOMENS FORMULA PO) Take 1 tablet by mouth daily.    [provider]  nicotine polacrilex (NICORETTE) 4 MG gum Take 1 each (4 mg total) by mouth as needed for smoking cessation. Chew 1 piece every 2 hours as needed; no more than  24 pieces a day Patient not taking: Reported on 07/30/2020 02/18/20   Corrin ParkerGoodrich, Callie E, PA-C  nitroGLYCERIN (NITROSTAT) 0.4 MG SL tablet Place 1 tablet (0.4 mg total) under the tongue every 5 (five) minutes x 3 doses as needed for chest pain. 02/08/20   Arty Baumgartneroberts, Lindsay B, NP  prazosin (MINIPRESS) 1 MG capsule Take 1 capsule (1 mg total) by mouth at bedtime. To prevent nightmares 07/25/15   Withrow, Everardo AllJohn C, FNP  QUEtiapine (SEROQUEL) 50 MG tablet Take 50 mg by mouth at bedtime.    [provider]  traZODone (DESYREL) 50 MG tablet Take 50 mg by mouth at bedtime. 01/16/20   [provider]    Allergies    Percocet [oxycodone-acetaminophen] and Ambien [zolpidem]  Review of Systems   Review of Systems  Constitutional: Positive for appetite change, chills and fatigue. Negative for fever.  Eyes: Negative for visual disturbance.  Respiratory: Positive for cough and shortness of breath.   Cardiovascular: Positive for chest pain. Negative for palpitations and leg swelling.  Gastrointestinal: Positive for diarrhea and nausea. Negative for abdominal pain, constipation and vomiting.  Musculoskeletal: Positive for myalgias. Negative for neck pain and neck stiffness.  Skin: Negative for rash.  Neurological: Positive for headaches. Negative for weakness and numbness.  All other systems reviewed and are negative.   Physical Exam Updated Vital Signs BP 137/87 (BP Location: Right Arm)   Pulse  94   Temp 98.2 F (36.8 C)   Resp 16   LMP 10/07/2011   SpO2 98%   Physical Exam Vitals and nursing note reviewed.  Constitutional:      Appearance: She is not ill-appearing.     Comments: Tearful on exam  HENT:     Head: Normocephalic and atraumatic.  Eyes:     Conjunctiva/sclera: Conjunctivae normal.  Cardiovascular:     Rate and Rhythm: Normal rate and regular rhythm.     Pulses:          Radial pulses are 2+ on the right side and 2+ on the left side.       Dorsalis pedis pulses are 2+ on the right side and 2+ on the left side.     Heart sounds: Normal heart sounds.  Pulmonary:     Effort: Pulmonary effort is normal.     Breath sounds: Normal breath sounds. No decreased breath sounds, wheezing, rhonchi or rales.     Comments: Actively coughing in room. Speaking in full sentences between coughing spells. Satting 98% on RA. LCTAB.  Chest:     Chest wall: Tenderness present.  Abdominal:     Palpations: Abdomen is soft.     Tenderness: There is no abdominal tenderness. There is no guarding or rebound.  Musculoskeletal:     Cervical back: Neck supple.     Right lower leg: No edema.     Left lower leg: No edema.  Skin:    General: Skin is warm and dry.  Neurological:     Mental Status: She is alert.     ED Results / Procedures / Treatments   Labs (all labs ordered are listed, but only abnormal results are displayed) Labs Reviewed  BASIC METABOLIC PANEL - Abnormal; Notable for the following components:      Result Value   Potassium 3.4 (*)    Glucose, Bld 181 (*)    BUN <5 (*)    Calcium 10.7 (*)    All other components within normal limits  URINALYSIS, ROUTINE W REFLEX  MICROSCOPIC - Abnormal; Notable for the following components:   Color, Urine STRAW (*)    Glucose, UA 50 (*)    Leukocytes,Ua TRACE (*)    All other components within normal limits  CBG MONITORING, ED - Abnormal; Notable for the following components:   Glucose-Capillary 144 (*)    All other  components within normal limits  RESP PANEL BY RT-PCR (FLU A&B, COVID) ARPGX2  CBC  I-STAT BETA HCG BLOOD, ED ([facility], [facility], [facility] ONLY)  POC SARS CORONAVIRUS 2 AG -  ED  TROPONIN I (HIGH SENSITIVITY)  TROPONIN I (HIGH SENSITIVITY)    EKG EKG Interpretation  Date/Time:  Sunday December 31 2020 16:36:59 EDT Ventricular Rate:  98 PR Interval:  118 QRS Duration: 80 QT Interval:  346 QTC Calculation: 441 R Axis:   61 Text Interpretation: Normal sinus rhythm Nonspecific ST and T wave abnormality Abnormal ECG No significant change since last tracing Confirmed by Haviland, Julie ([id number]) on 12/31/2020 4:55:51 PM   Radiology DG Chest Portable 1 View  Result Date: 12/31/2020 CLINICAL DATA:  Shortness of breath, center of chest pain. EXAM: PORTABLE CHEST 1 VIEW COMPARISON:  July 30, 2020 FINDINGS: Trachea midline. Cardiomediastinal contours and hilar structures are normal. Lungs are clear. No sign of effusion on frontal radiograph. On limited assessment no acute skeletal process. IMPRESSION: No active disease. Electronically Signed   By: Geoffrey  Wile M.D.   On: 12/31/2020 17:48    Procedures Procedures   Medications Ordered in ED Medications  sodium chloride 0.9 % bolus 1,000 mL (1,000 mLs Intravenous New Bag/Given 12/31/20 1811)  ondansetron (ZOFRAN) injection 4 mg (4 mg Intravenous Given 12/31/20 1819)    ED Course  I have reviewed the triage vital signs and the nursing notes.  Pertinent labs & imaging results that were available during my care of the patient were reviewed by me and considered in my medical decision making (see chart for details).    MDM Rules/Calculators/A&P                          49  year old female presents to the ED today complaining of a constellation of symptoms including chest heaviness, dyspnea on exertion, nausea, body aches, chills, fatigue, diarrhea, loss of taste for the past 4 days.  Vaccinated x2.  Concerned that she has Covid.  She does have a history of  NSTEMI in the past and states this feels completely different.  She is complaining of some tingling in her left arm however she is neurovascularly intact with good pulses.  She is tearful on exam today.  She is actively coughing in the room however able to speak in full sentences without difficulty between coughing spells and satting 98% on room air.  Remainder of vitals unremarkable upon arrival.  EKG obtained which is unchanged from previous.  Lab work was obtained while patient was in the waiting room including troponin, given history of NSTEMI do feel this needs to be ruled out however given her constellation of symptoms more concern for COVID-19 versus other viral infection as opposed to ACS.  Will swab for Covid at this time.  Will provide fluids and Zofran given patient states she has not been eating or drinking much for the past few days due to feelings of fatigue.  Reports that her blood sugars have been running low, awaiting CBG.   CBC without leukocytosis to suggest bacterial infection. Hgb stable at 13.7 BMP with potassium 3.4, glucose 181. Creatinine stable  at 0.88. No other electrolyte abnormalities Beta hcg negative POC COVID negative. Will swab for COVID and flu with PCR test Troponin 3; will repeat  U/A without infection Repeat troponin of 4 COVID and flu test negative  Workup reassuring at this time. Suspect pt may be dealing with another viral illness causing her symptoms. On reevaluation pt resting comfortably; reports significant improvement after IV fluids. Suspect she has not been staying hydrated at home.   She is encouraged to increase her oral intake to stay hydrated. Will prescribe zofran for symptomatic relief as well as tessalon perles. Pt instructed on strict return precautions. She is in agreement with plan and stable for discharge.   This note was prepared using Dragon voice recognition software and may include unintentional dictation errors due to the inherent  limitations of voice recognition software.  Vanessa Case was evaluated in Emergency Department on 12/31/2020 for the symptoms described in the history of present illness. She was evaluated in the context of the global COVID-19 pandemic, which necessitated consideration that the patient might be at risk for infection with the SARS-CoV-2 virus that causes COVID-19. Institutional protocols and algorithms that pertain to the evaluation of patients at risk for COVID-19 are in a state of rapid change based on information released by regulatory bodies including the CDC and federal and state organizations. These policies and algorithms were followed during the patient's care in the ED.  Final Clinical Impression(s) / ED Diagnoses Final diagnoses:  Viral illness    Rx / DC Orders ED Discharge Orders         Ordered    benzonatate (TESSALON) 100 MG capsule  Every 8 hours        12/31/20 2126    ondansetron (ZOFRAN ODT) 4 MG disintegrating tablet  Every 8 hours PRN        12/31/20 2126           Discharge Instructions     Your workup was reassuring at this time. Please pick up medications and take as needed for nausea and cough. Drink plenty of fluids to stay hydrated and try to eat small meals throughout the day as your body needs nutrients. If your blood sugar gets too low you may feel worse.   Please follow up with your PCP regarding your ED visit. They should see you  by the end of this week for reevaluation.   Return to the ED IMMEDIATELY for any worsening symptoms       Tanda Rockers, Cordelia Poche 12/31/20 2127    Jacalyn Lefevre, MD 12/31/20 2225

## 2021-02-10 ENCOUNTER — Other Ambulatory Visit: Payer: Self-pay

## 2021-02-10 ENCOUNTER — Emergency Department (HOSPITAL_COMMUNITY): Payer: BC Managed Care – PPO

## 2021-02-10 ENCOUNTER — Emergency Department (HOSPITAL_COMMUNITY)
Admission: EM | Admit: 2021-02-10 | Discharge: 2021-02-10 | Disposition: A | Discharge status: Home / Self Care | Payer: BC Managed Care – PPO | Attending: Emergency Medicine | Admitting: Emergency Medicine

## 2021-02-10 ENCOUNTER — Encounter (HOSPITAL_COMMUNITY): Payer: Self-pay | Admitting: Emergency Medicine

## 2021-02-10 DIAGNOSIS — R202 Paresthesia of skin: Secondary | ICD-10-CM | POA: Insufficient documentation

## 2021-02-10 DIAGNOSIS — Z7982 Long term (current) use of aspirin: Secondary | ICD-10-CM | POA: Diagnosis not present

## 2021-02-10 DIAGNOSIS — F17200 Nicotine dependence, unspecified, uncomplicated: Secondary | ICD-10-CM | POA: Diagnosis not present

## 2021-02-10 DIAGNOSIS — E1142 Type 2 diabetes mellitus with diabetic polyneuropathy: Secondary | ICD-10-CM | POA: Diagnosis not present

## 2021-02-10 DIAGNOSIS — R42 Dizziness and giddiness: Secondary | ICD-10-CM | POA: Diagnosis not present

## 2021-02-10 DIAGNOSIS — R519 Headache, unspecified: Secondary | ICD-10-CM

## 2021-02-10 DIAGNOSIS — R5382 Chronic fatigue, unspecified: Secondary | ICD-10-CM | POA: Diagnosis not present

## 2021-02-10 DIAGNOSIS — M79602 Pain in left arm: Secondary | ICD-10-CM | POA: Diagnosis not present

## 2021-02-10 DIAGNOSIS — Z7984 Long term (current) use of oral hypoglycemic drugs: Secondary | ICD-10-CM | POA: Diagnosis not present

## 2021-02-10 DIAGNOSIS — Z79899 Other long term (current) drug therapy: Secondary | ICD-10-CM | POA: Diagnosis not present

## 2021-02-10 DIAGNOSIS — I10 Essential (primary) hypertension: Secondary | ICD-10-CM | POA: Insufficient documentation

## 2021-02-10 LAB — BASIC METABOLIC PANEL
Anion gap: 8 (ref 5–15)
BUN: 5 mg/dL — ABNORMAL LOW (ref 6–20)
CO2: 27 mmol/L (ref 22–32)
Calcium: 11.2 mg/dL — ABNORMAL HIGH (ref 8.9–10.3)
Chloride: 104 mmol/L (ref 98–111)
Creatinine, Ser: 0.56 mg/dL (ref 0.44–1.00)
GFR, Estimated: >60 mL/min (ref >60)
Glucose, Bld: 188 mg/dL — ABNORMAL HIGH (ref 70–99)
Potassium: 4.1 mmol/L (ref 3.5–5.1)
Sodium: 139 mmol/L (ref 135–145)

## 2021-02-10 LAB — CBC
HCT: 40 % (ref 36.0–46.0)
Hemoglobin: 13 g/dL (ref 12.0–15.0)
MCH: 29.1 pg (ref 26.0–34.0)
MCHC: 32.5 g/dL (ref 30.0–36.0)
MCV: 89.7 fL (ref 80.0–100.0)
Platelets: 364 10*3/uL (ref 150–400)
RBC: 4.46 MIL/uL (ref 3.87–5.11)
RDW: 13.3 % (ref 11.5–15.5)
WBC: 8.9 10*3/uL (ref 4.0–10.5)
nRBC: 0 % (ref 0.0–0.2)

## 2021-02-10 LAB — I-STAT BETA HCG BLOOD, ED (MC, WL, AP ONLY): I-stat hCG, quantitative: <5 m[IU]/mL (ref <5)

## 2021-02-10 LAB — TROPONIN I (HIGH SENSITIVITY)
Troponin I (High Sensitivity): 3 ng/L (ref <18)
Troponin I (High Sensitivity): 3 ng/L (ref <18)

## 2021-02-10 MED ORDER — DILTIAZEM HCL ER COATED BEADS 180 MG PO CP24: 180.0000 mg | ORAL_CAPSULE | Freq: Every day | ORAL | 0 refills | Status: DC

## 2021-02-10 NOTE — ED Triage Notes (Signed)
Emergency Medicine Provider Triage Evaluation Note  Vanessa Case , a 50 y.o. female  was evaluated in triage.  Pt complains of chest pain.  Out of her blood pressure medications for the last month.  Review of Systems  Positive: Left-sided upper chest pain constant since yesterday, left arm tingling, headache Negative: Fever, shortness of breath, diaphoresis, syncope, abdominal pain, back pain, focal weakness  Physical Exam  BP (!) 141/84 (BP Location: Right Arm)   Pulse (!) 102   Temp 99.2 F (37.3 C) (Oral)   Resp 16   LMP 10/07/2011   SpO2 100%  Gen:   Awake, no distress   HEENT:  Atraumatic  Resp:  Normal effort  Cardiac:  Normal rate  Abd:   Nondistended, nontender  MSK:   Moves extremities without difficulty  Neuro:  Speech clear, grip strength equal bilaterally, no arm drift, no facial droop  Medical Decision Making  Medically screening exam initiated at 6:01 PM.  Appropriate orders placed.  Vanessa Case was informed that the remainder of the evaluation will be completed by another provider, this initial triage assessment does not replace that evaluation, and the importance of remaining in the ED until their evaluation is complete.  Clinical Impression   Chest pain since yesterday.  Nontoxic-appearing.  EKG without STEMI.   Anselm Pancoast, PA-C 02/10/21 1804

## 2021-02-10 NOTE — Discharge Instructions (Signed)
Your work-up here in the emergency department did not show any significant abnormality  I have refilled your blood pressure medication.  Please follow-up with your primary care provider who normally refills this medication.  Return for new or worsening symptoms

## 2021-02-10 NOTE — ED Notes (Signed)
Patient transported to CT 

## 2021-02-10 NOTE — ED Notes (Signed)
Returned from ct scan at this time 

## 2021-02-10 NOTE — ED Notes (Signed)
Provider at bedside at this time

## 2021-02-10 NOTE — ED Provider Notes (Signed)
[facility] EMERGENCY DEPARTMENT Provider Note   CSN: [id number] Arrival date & time: 02/10/21  1753     History Chief Complaint  Patient presents with  . Chest Pain    Vanessa Case is a 50 y.o. female with past medical history significant for chronic abdominal pain, chronic headache, chronic back pain, substance use, prior NSTEMI who presents for evaluation of multiple complaints.  Patient states she has had a headache, left arm pain which starts from her shoulder to her elbow, left tingling which began yesterday morning.  She felt like she had some lightheadedness and had a near syncopal episode.  She denies any actual syncope.  No seizure-like activity.  She was initially triaged for chest pain.  Patient is adamant she does not have chest pain states it is "my arm."  States she has been out of her blood pressure medication x 1 month.  Does admit to using cocaine intermittently. States does not feel similar to her prior NSTEMI.  Patient states she has had similar tingling in her left arm previously.  No associated weakness. States she has chronic fatigue.  Denies sudden onset headache, facial droop, weakness, chest pain, shortness of breath abdominal pain, diarrhea, dysuria, fever, chills.  Denies any vision changes however does state her left eyelid will "twitch" which has been ongoing over the last year. Worse with stress and lack of sleep. No neck stiffness or rash.  Rates her headache a 3/10.  Denies additional aggravating or alleviating factors  History obtained from patient and past medical records.  No interpreter used  HPI     Past Medical History:  Diagnosis Date  . Anemia   . Anxiety   . Arthritis   . Chronic abdominal pain   . Chronic back pain   . Chronic headache   . Chronic neck pain   . Depression   . Diabetes mellitus    adult onset dm-maintained on glipizide and metformin, pt states fasting glucose runs 110  . Fibromyalgia   . GERD  (gastroesophageal reflux disease)   . Hyperlipidemia   . Hypertension    maintained on lisinopril hct, metoprolol-134/86 at pat visit  . Neuromuscular disorder (HCC)   . Neuropathy   . Obesity   . PTSD (post-traumatic stress disorder)   . Substance abuse [facility])     Patient Active Problem List   Diagnosis Date Noted  . Substance abuse (HCC) 02/08/2020  . NSTEMI (non-ST elevated myocardial infarction) (HCC) 02/05/2020  . Suicidal ideation 07/20/2015  . Major depressive disorder, recurrent, severe without psychotic features (HCC)   . Type 2 diabetes mellitus with diabetic polyneuropathy (HCC) 05/30/2015  . Other fatigue 05/30/2015  . Essential hypertension 05/30/2015  . HLD (hyperlipidemia) 05/30/2015  . Bipolar 2 disorder, major depressive episode (HCC) 09/20/2014  . Cholelithiasis with acute cholecystitis 06/28/2013  . Right arm weakness 03/05/2013    Past Surgical History:  Procedure Laterality Date  . ABDOMINAL HYSTERECTOMY  10/30/2011   Procedure: HYSTERECTOMY ABDOMINAL;  Surgeon: Bing Plume, MD;  Location: [facility] ORS;  Service: Gynecology;  Laterality: N/A;  . CESAREAN SECTION  x3  . CHOLECYSTECTOMY N/A 06/28/2013   Procedure: LAPAROSCOPIC CHOLECYSTECTOMY;  Surgeon: Shelly Rubenstein, MD;  Location: [facility] OR;  Service: General;  Laterality: N/A;  . CORONARY STENT INTERVENTION N/A 02/07/2020   Procedure: CORONARY STENT INTERVENTION;  Surgeon: Marykay Lex, MD;  Location: [facility] INVASIVE CV LAB;  Service: Cardiovascular;  Laterality: N/A;  . LEFT HEART CATH AND CORONARY  ANGIOGRAPHY N/A 02/07/2020   Procedure: LEFT HEART CATH AND CORONARY ANGIOGRAPHY;  Surgeon: Marykay LexHarding, David W, MD;  [facility];  Service: Cardiovascular;  Laterality: N/A;  . SALPINGOOPHORECTOMY  10/30/2011   Procedure: SALPINGO OOPHERECTOMY;  Surgeon: Bing Plumehomas F Henley, MD;  Location: [facility] ORS;  Service: Gynecology;  Laterality: Right;  . WRIST SURGERY     carpel tunnel and tendonitis     OB History     Gravida  3   Para  3   Term  3   Preterm      AB      Living  3     SAB      IAB      Ectopic      Multiple      Live Births              Family History  Problem Relation Age of Onset  . Diabetes Mother   . Atrial fibrillation Mother   . Cystic fibrosis Father   . Diabetes Maternal Grandmother   . Heart disease Maternal Grandfather     Social History   Tobacco Use  . Smoking status: Current Every Day Smoker  . Smokeless tobacco: Never Used  . Tobacco comment: has smoked for 14 years total - quit for 6 years at one point but has been smoking the last 3 years  Vaping Use  . Vaping Use: Never used  Substance Use Topics  . Alcohol use: Not Currently    Alcohol/week: 0.0 standard drinks  . Drug use: Yes    Types: Cocaine, Marijuana    Comment: last used cocaine 2 days ago and marijuana 1 day ago    Home Medications Prior to Admission medications   Medication Sig Start Date End Date Taking? Authorizing Provider  acetaminophen (TYLENOL) 500 MG tablet Take 1,000 mg by mouth daily as needed for mild pain.    [provider]  aspirin EC 81 MG EC tablet Take 1 tablet (81 mg total) by mouth daily. 02/09/20   Arty Baumgartneroberts, Lindsay B, NP  atorvastatin (LIPITOR) 80 MG tablet Take 1 tablet (80 mg total) by mouth daily. 02/09/20   Arty Baumgartneroberts, Lindsay B, NP  benzonatate (TESSALON) 100 MG capsule Take 1 capsule (100 mg total) by mouth every 8 (eight) hours. 12/31/20   Hyman HopesVenter, Margaux, PA-C  dicyclomine (BENTYL) 10 MG capsule Take 1 capsule (10 mg total) by mouth 4 (four) times daily -  before meals and at bedtime. 01/15/18   Meryl DareStark, Malcolm T, MD  diltiazem (CARDIZEM CD) 180 MG 24 hr capsule Take 1 capsule (180 mg total) by mouth daily. 02/10/21 03/12/21  Cheyenna Pankowski A, PA-C  FLUoxetine (PROZAC) 40 MG capsule Take 40 mg by mouth daily. 12/22/19   [provider]  fluticasone (FLOVENT HFA) 44 MCG/ACT inhaler Inhale 2 puffs into the lungs 2 (two) times daily as needed  (for shortness of breath). 03/14/16   Benjiman CorePickering, Nathan, MD  gabapentin (NEURONTIN) 300 MG capsule Take 300 mg by mouth 2 (two) times daily. 12/22/19   [provider]  hydrOXYzine (ATARAX/VISTARIL) 25 MG tablet Take 25 mg by mouth daily. 07/19/20   [provider]  isosorbide mononitrate (IMDUR) 30 MG 24 hr tablet Take 1 tablet (30 mg total) by mouth daily. 07/30/20   Alvira MondaySchlossman, Erin, MD  metFORMIN (GLUCOPHAGE) 500 MG tablet Take 500 mg by mouth 2 (two) times daily. 12/22/19   [provider]  Multiple Vitamins-Calcium (ONE-A-DAY WOMENS FORMULA PO) Take 1  tablet by mouth daily.    [provider]  nicotine polacrilex (NICORETTE) 4 MG gum Take 1 each (4 mg total) by mouth as needed for smoking cessation. Chew 1 piece every 2 hours as needed; no more than 24 pieces a day Patient not taking: Reported on 07/30/2020 02/18/20   Corrin Parker, PA-C  nitroGLYCERIN (NITROSTAT) 0.4 MG SL tablet Place 1 tablet (0.4 mg total) under the tongue every 5 (five) minutes x 3 doses as needed for chest pain. 02/08/20   Arty Baumgartner, NP  ondansetron (ZOFRAN ODT) 4 MG disintegrating tablet Take 1 tablet (4 mg total) by mouth every 8 (eight) hours as needed for nausea or vomiting. 12/31/20   Hyman Hopes, Margaux, PA-C  prazosin (MINIPRESS) 1 MG capsule Take 1 capsule (1 mg total) by mouth at bedtime. To prevent nightmares 07/25/15   Withrow, Everardo All, FNP  QUEtiapine (SEROQUEL) 50 MG tablet Take 50 mg by mouth at bedtime.    [provider]  traZODone (DESYREL) 50 MG tablet Take 50 mg by mouth at bedtime. 01/16/20   [provider]    Allergies    Percocet [oxycodone-acetaminophen] and Ambien [zolpidem]  Review of Systems   Review of Systems  Constitutional: Negative.   HENT: Negative.   Respiratory: Negative.   Cardiovascular: Negative.   Gastrointestinal: Negative.   Genitourinary: Negative.   Musculoskeletal: Negative.        Left arm pain  Skin: Negative.    Neurological: Positive for numbness and headaches. Negative for dizziness, tremors, syncope, facial asymmetry, speech difficulty and weakness.  All other systems reviewed and are negative.   Physical Exam Updated Vital Signs BP (!) 145/86   Pulse 88   Temp 99.2 F (37.3 C) (Oral)   Resp 16   Ht  (1.651 m)   Wt 70.3 kg   LMP 10/07/2011   SpO2 100%   BMI 25.79 kg/m   Physical Exam  Physical Exam  Constitutional: Pt is oriented to person, place, and time. Pt appears well-developed and well-nourished. No distress.  HENT:  Head: Normocephalic and atraumatic.  Mouth/Throat: Oropharynx is clear and moist.  Eyes: Conjunctivae and EOM are normal. Pupils are equal, round, and reactive to light. No scleral icterus.  No horizontal, vertical or rotational nystagmus  Neck: Normal range of motion. Neck supple.  Full active and passive ROM without pain No midline or paraspinal tenderness No nuchal rigidity or meningeal signs  Cardiovascular: Normal rate, regular rhythm and intact distal pulses.   Pulmonary/Chest: Effort normal and breath sounds normal. No respiratory distress. Pt has no wheezes. No rales.  Abdominal: Soft. Bowel sounds are normal. There is no tenderness. There is no rebound and no guarding.  Musculoskeletal: Normal range of motion.  Lymphadenopathy:    No cervical adenopathy.  Neurological: Pt. is alert and oriented to person, place, and time. He has normal reflexes. No cranial nerve deficit.  Exhibits normal muscle tone. Coordination normal.  Mental Status:  Alert, oriented, thought content appropriate. Speech fluent without evidence of aphasia. Able to follow 2 step commands without difficulty.  Cranial Nerves:  II:  Peripheral visual fields grossly normal, pupils equal, round, reactive to light III,IV, VI: ptosis not present, extra-ocular motions intact bilaterally  V,VII: smile symmetric, facial light touch sensation equal VIII: hearing grossly normal  bilaterally  IX,X: midline uvula rise  XI: bilateral shoulder shrug equal and strong XII: midline tongue extension  Motor:  5/5 in upper and lower extremities bilaterally including strong and  equal grip strength and dorsiflexion/plantar flexion Sensory: Pinprick and light touch normal in all extremities. Cerebellar: normal finger-to-nose with bilateral upper extremities Gait: normal gait and balance CV: distal pulses palpable throughout   Skin: Skin is warm and dry. No rash noted. Pt is not diaphoretic.  Psychiatric: Pt has a normal mood and affect. Behavior is normal. Judgment and thought content normal.  Nursing note and vitals reviewed. ED Results / Procedures / Treatments   Labs (all labs ordered are listed, but only abnormal results are displayed) Labs Reviewed  BASIC METABOLIC PANEL - Abnormal; Notable for the following components:      Result Value   Glucose, Bld 188 (*)    BUN 5 (*)    Calcium 11.2 (*)    All other components within normal limits  CBC  I-STAT BETA HCG BLOOD, ED ([facility], [facility], [facility] ONLY)  TROPONIN I (HIGH SENSITIVITY)  TROPONIN I (HIGH SENSITIVITY)    EKG EKG Interpretation  Date/Time:  Saturday February 10 2021 17:59:53 EDT Ventricular Rate:  112 PR Interval:  118 QRS Duration: 72 QT Interval:  322 QTC Calculation: 439 R Axis:   73 Text Interpretation: Sinus tachycardia Otherwise normal ECG similar to previous Confirmed by Frederick Peers [phone]) on 02/10/2021 10:09:44 PM   Radiology DG Chest 2 View  Result Date: 02/10/2021 CLINICAL DATA:  Chest pain and shortness of breath. EXAM: CHEST - 2 VIEW COMPARISON:  Radiograph 12/31/2020 03/24/2020 FINDINGS: The cardiomediastinal contours are normal. The lungs are clear. Pulmonary vasculature is normal. No consolidation, pleural effusion, or pneumothorax. No acute osseous abnormalities are seen. IMPRESSION: Negative radiographs of the chest. Electronically Signed   By: Narda Rutherford M.D.   On: 02/10/2021 18:59    CT Head Wo Contrast  Result Date: 02/10/2021 CLINICAL DATA:  Headache and left arm numbness with chest pain since yesterday EXAM: CT HEAD WITHOUT CONTRAST TECHNIQUE: Contiguous axial images were obtained from the base of the skull through the vertex without intravenous contrast. COMPARISON:  February 15, 2015 FINDINGS: Brain: No evidence of acute large vascular territory infarction, hemorrhage, hydrocephalus, extra-axial collection or mass lesion/mass effect. Vascular: No hyperdense vessel or unexpected calcification. Skull: Normal. Negative for fracture or focal lesion. Sinuses/Orbits: Paranasal sinuses and mastoid air cells are predominantly clear. Orbits are grossly unremarkable. Other: None IMPRESSION: No acute intracranial findings. Electronically Signed   By: Maudry Mayhew MD   On: 02/10/2021 21:11    Procedures Procedures   Medications Ordered in ED Medications - No data to display  ED Course  I have reviewed the triage vital signs and the nursing notes.  Pertinent labs & imaging results that were available during my care of the patient were reviewed by me and considered in my medical decision making (see chart for details).  50 year old history of cocaine use who presents for evaluation of multiple complaints.  She is afebrile, nonseptic, not ill-appearing.  Headache which began yesterday.  She feels like she has tingling in her left arm.  She has had this previously.  No neck stiffness or neck rigidity.  She has no meningismus.  No associated weakness, facial droop, difficulty with word finding.  Does admit to pain in her left arm from her shoulder to her elbow.  She is adamant that she does not chest pain this does not feel similar to her prior NSTEMI.  No visual field.  Nonfocal neuro exam without deficits.  Wells criteria low risk.  Work-up started from triage today personally reviewed and interpreted:  CBC without  leukocytosis Metabolic panel, glucose 188, noticed electrolyte,  renal abnormality Pregnancy test negative Troponin 3 CT head without acute findings DG chest without pulmonary edema, pneumothorax, infiltrates  Reassessed.  Now states she has had tingling to her left upper extremity for over 1 month as well as the pain.  She has no midline cervical tenderness to suggest acute disc bulge require neurosurgical emergency.  Low suspicion for acute CVA, dissection, SAH, ICH, meningitis or temporal artery  With regards to her left arm pain.  She has no chest pain.  Her delta troponins are within normal limits.  Her EKG does not show any ischemic changes.  Does not seem consistent with ACS, unstable angina, dissection.  Have her follow-up outpatient PCP  Arm pain is not likely of cardiac or pulmonary etiology d/t presentation, PERC negative, VSS, no tracheal deviation, no JVD or new murmur, RRR, breath sounds equal bilaterally, EKG without acute abnormalities, negative troponin, and negative CXR.   Patient blood pressures minimally elevated here.  I have low suspicion for acute hypertensive urgency or emergency. Will refill her home blood pressure medication and encourage PCP follow up.  The patient has been appropriately medically screened and/or stabilized in the ED. I have low suspicion for any other emergent medical condition which would require further screening, evaluation or treatment in the ED or require inpatient management.  Patient is hemodynamically stable and in no acute distress.  Patient able to ambulate in department prior to ED.  Evaluation does not show acute pathology that would require ongoing or additional emergent interventions while in the emergency department or further inpatient treatment.  I have discussed the diagnosis with the patient and answered all questions.  Pain is been managed while in the emergency department and patient has no further complaints prior to discharge.  Patient is comfortable with plan discussed in room and is stable for  discharge at this time.  I have discussed strict return precautions for returning to the emergency department.  Patient was encouraged to follow-up with PCP/specialist refer to at discharge.  Discussed with attending, Dr. Clarene Duke who agrees with above treatment, plan and disposition     MDM Rules/Calculators/A&P                           Final Clinical Impression(s) / ED Diagnoses Final diagnoses:  Acute nonintractable headache, unspecified headache type  Left arm pain    Rx / DC Orders ED Discharge Orders         Ordered    diltiazem (CARDIZEM CD) 180 MG 24 hr capsule  Daily        02/10/21 2216           Munirah Doerner A, PA-C 02/10/21 2220    Little, Ambrose Finland, MD 02/11/21 1455

## 2021-02-10 NOTE — ED Triage Notes (Signed)
Pt reports headache, L arm numbness, and chest pain that started yesterday.  Reports syncopal episode at work yesterday.  Feeling lightheaded last night when driving.  Out of BP medication x 1 month.

## 2021-03-27 NOTE — Progress Notes (Incomplete)
Cardiology Office Note:    Date:  03/27/2021   ID:  Vanessa Case, DOB May 11, 1971, MRN [id number]  PCP:  [facility]  Cardiologist:  Buford Dresser, MD  Referring MD: Center, [facility]   No chief complaint on file.   History of Present Illness:    Vanessa Case is a 50 y.o. female with a hx of anemia, anxiety, arthritis, depression, diabetes mellitus, fibromyalgia, GERD, hyperlipidemia, hypertension, neuropathy, obesity, PTSD, and substance abuse*** who is seen for follow-up today. I initially met her as a new consult at the request of [facility], [place] for the evaluation and management of ***. I last saw her in the ED 02/05/2020.  Today:  She denies any chest pain, shortness of breath, palpitations, or exertional symptoms. No headaches, lightheadedness, or syncope to report. Also has no lower extremity edema, orthopnea or PND.    Past Medical History:  Diagnosis Date   Anemia    Anxiety    Arthritis    Chronic abdominal pain    Chronic back pain    Chronic headache    Chronic neck pain    Depression    Diabetes mellitus    adult onset dm-maintained on glipizide and metformin, pt states fasting glucose runs 110   Fibromyalgia    GERD (gastroesophageal reflux disease)    Hyperlipidemia    Hypertension    maintained on lisinopril hct, metoprolol-134/86 at pat visit   Neuromuscular disorder ([place])    Neuropathy    Obesity    PTSD (post-traumatic stress disorder)    Substance abuse ([place])     Past Surgical History:  Procedure Laterality Date   ABDOMINAL HYSTERECTOMY  10/30/2011   Procedure: HYSTERECTOMY ABDOMINAL;  Surgeon: Melina Schools, MD;  Location: [place] [facility];  Service: Gynecology;  Laterality: N/A;   CESAREAN SECTION  x3   CHOLECYSTECTOMY N/A 06/28/2013   Procedure: LAPAROSCOPIC CHOLECYSTECTOMY;  Surgeon: Harl Bowie, MD;  Location: [place];  Service: General;  Laterality: N/A;   CORONARY STENT  INTERVENTION N/A 02/07/2020   Procedure: CORONARY STENT INTERVENTION;  Surgeon: Leonie Man, MD;  Location: [place] [facility];  Service: Cardiovascular;  Laterality: N/A;   LEFT HEART CATH AND CORONARY ANGIOGRAPHY N/A 02/07/2020   Procedure: LEFT HEART CATH AND CORONARY ANGIOGRAPHY;  Surgeon: Leonie Man, MD;  Location: [place] [facility];  Service: Cardiovascular;  Laterality: N/A;   SALPINGOOPHORECTOMY  10/30/2011   Procedure: SALPINGO OOPHERECTOMY;  Surgeon: Melina Schools, MD;  Location: [place] [facility] [facility];  Service: Gynecology;  Laterality: Right;   WRIST SURGERY     carpel tunnel and tendonitis    Current Medications: Current Outpatient Medications on File Prior to Visit  Medication Sig   acetaminophen (TYLENOL) 500 MG tablet Take 1,000 mg by mouth daily as needed for mild pain.   aspirin EC 81 MG EC tablet Take 1 tablet (81 mg total) by mouth daily.   atorvastatin (LIPITOR) 80 MG tablet Take 1 tablet (80 mg total) by mouth daily.   benzonatate (TESSALON) 100 MG capsule Take 1 capsule (100 mg total) by mouth every 8 (eight) hours.   dicyclomine (BENTYL) 10 MG capsule Take 1 capsule (10 mg total) by mouth 4 (four) times daily -  before meals and at bedtime.   diltiazem (CARDIZEM CD) 180 MG 24 hr capsule Take 1 capsule (180 mg total) by mouth daily.   FLUoxetine (PROZAC) 40 MG capsule Take 40 mg by mouth daily.   fluticasone (FLOVENT HFA) 44 MCG/ACT  inhaler Inhale 2 puffs into the lungs 2 (two) times daily as needed (for shortness of breath).   gabapentin (NEURONTIN) 300 MG capsule Take 300 mg by mouth 2 (two) times daily.   hydrOXYzine (ATARAX/VISTARIL) 25 MG tablet Take 25 mg by mouth daily.   isosorbide mononitrate (IMDUR) 30 MG 24 hr tablet Take 1 tablet (30 mg total) by mouth daily.   metFORMIN (GLUCOPHAGE) 500 MG tablet Take 500 mg by mouth 2 (two) times daily.   Multiple Vitamins-Calcium (ONE-A-DAY WOMENS FORMULA PO) Take 1 tablet by mouth daily.   nicotine  polacrilex (NICORETTE) 4 MG gum Take 1 each (4 mg total) by mouth as needed for smoking cessation. Chew 1 piece every 2 hours as needed; no more than 24 pieces a day (Patient not taking: Reported on 07/30/2020)   nitroGLYCERIN (NITROSTAT) 0.4 MG SL tablet Place 1 tablet (0.4 mg total) under the tongue every 5 (five) minutes x 3 doses as needed for chest pain.   ondansetron (ZOFRAN ODT) 4 MG disintegrating tablet Take 1 tablet (4 mg total) by mouth every 8 (eight) hours as needed for nausea or vomiting.   prazosin (MINIPRESS) 1 MG capsule Take 1 capsule (1 mg total) by mouth at bedtime. To prevent nightmares   QUEtiapine (SEROQUEL) 50 MG tablet Take 50 mg by mouth at bedtime.   traZODone (DESYREL) 50 MG tablet Take 50 mg by mouth at bedtime.   No current facility-administered medications on file prior to visit.     Allergies:   Percocet [oxycodone-acetaminophen] and Ambien [zolpidem]   Social History   Tobacco Use   Smoking status: Current Every Day Smoker   Smokeless tobacco: Never Used   Tobacco comment: has smoked for 14 years total - quit for 6 years at one point but has been smoking the last 3 years  Vaping Use   Vaping Use: Never used  Substance Use Topics   Alcohol use: Not Currently    Alcohol/week: 0.0 standard drinks   Drug use: Yes    Types: Cocaine, Marijuana    Comment: last used cocaine 2 days ago and marijuana 1 day ago    Family History: family history includes Atrial fibrillation in her mother; Cystic fibrosis in her father; Diabetes in her maternal grandmother and mother; Heart disease in her maternal grandfather.  ROS:   Please see the history of present illness. (+) All other systems are reviewed and negative.    EKGs/Labs/Other Studies Reviewed:    The following studies were reviewed today:  LHC 02/07/2020:  CULPRIT LESION: Prox Cx to Mid Cx lesion is 99% stenosed.  A drug-eluting stent was successfully placed using a STENT RESOLUTE ONYX  2.5X12. - post-dilated to 2.7 mm  Post intervention, there is a 0% residual stenosis.  ---------------------  Prox LAD to Mid LAD lesion is 20% stenosed with 30% stenosed side branch in 1st Diag.  Dist LAD lesion is 60% stenosed. Focal Lesion - recommend Med Rx unless Sx progress.  Mid Cx lesion is 20% stenosed.  ---------------------  LV end diastolic pressure is normal.   SUMMARY  Severe Single Vessel CAD - CULPRIT LESION mLCx 99% subtotalled / thrombotic stenosis  Successful DES PCI of mCx - Resolute Onyx DES 2.5 mm x 12 mm (2.7 mm)  Moderate m-distal LAD focal 60% in bend.  Otherwise minmal CAD elsewhere.  Normal LVEDP   Echo 02/06/2020: 1. Left ventricular ejection fraction, by estimation, is 55 to 60%. The  left ventricle has normal function. There is mild  left ventricular  hypertrophy. Left ventricular diastolic parameters are indeterminate.  2. Right ventricular systolic function is normal. The right ventricular  size is normal. Tricuspid regurgitation signal is inadequate for assessing  PA pressure.  3. Left atrial size was mildly dilated.  4. The mitral valve is normal in structure. No evidence of mitral valve  regurgitation.  5. The aortic valve is tricuspid. Aortic valve regurgitation is not  visualized. Mild to moderate aortic valve sclerosis/calcification is  present, without any evidence of aortic stenosis.  6. The inferior vena cava is normal in size with greater than 50%  respiratory variability, suggesting right atrial pressure of 3 mmHg.   EKG:  EKG is personally reviewed.   03/28/2021: ***  Recent Labs: 07/30/2020: ALT 12 02/10/2021: BUN 5; Creatinine, Ser 0.56; Hemoglobin 13.0; Platelets 364; Potassium 4.1; Sodium 139  Recent Lipid Panel    Component Value Date/Time   CHOL 181 02/06/2020 0250   TRIG 201 (H) 02/06/2020 0250   HDL 40 (L) 02/06/2020 0250   CHOLHDL 4.5 02/06/2020 0250   VLDL 40 02/06/2020 0250   LDLCALC 101 (H) 02/06/2020  0250    Physical Exam:    VS:  LMP 10/07/2011     Wt Readings from Last 3 Encounters:  02/10/21 155 lb (70.3 kg)  07/30/20 162 lb (73.5 kg)  05/12/20 157 lb (71.2 kg)    GEN: Well nourished, well developed in no acute distress HEENT: Normal, moist mucous membranes NECK: No JVD CARDIAC: regular rhythm, normal S1 and S2, no rubs or gallops. No murmur. VASCULAR: Radial and DP pulses 2+ bilaterally. No carotid bruits RESPIRATORY:  Clear to auscultation without rales, wheezing or rhonchi  ABDOMEN: Soft, non-tender, non-distended MUSCULOSKELETAL:  Ambulates independently SKIN: Warm and dry, no edema NEUROLOGIC:  Alert and oriented x 3. No focal neuro deficits noted. PSYCHIATRIC:  Normal affect    ASSESSMENT:    No diagnosis found. PLAN:     Cardiac risk counseling and prevention recommendations: -recommend heart healthy/Mediterranean diet, with whole grains, fruits, vegetable, fish, lean meats, nuts, and olive oil. Limit salt. -recommend moderate walking, 3-5 times/week for 30-50 minutes each session. Aim for at least 150 minutes.week. Goal should be pace of 3 miles/hours, or walking 1.5 miles in 30 minutes -recommend avoidance of tobacco products. Avoid excess alcohol. -ASCVD risk score: The ASCVD Risk score Mikey Bussing DC Jr., et al., 2013) failed to calculate for the following reasons:   The patient has a prior MI or stroke diagnosis    Plan for follow up: ***  Buford Dresser, MD, PhD, [facility]    Medication Adjustments/Labs and Tests Ordered: Current medicines are reviewed at length with the patient today.  Concerns regarding medicines are outlined above.  No orders of the defined types were placed in this encounter.  No orders of the defined types were placed in this encounter.   There are no Patient Instructions on file for this visit.   I,Mathew Stumpf,acting as a Education administrator for [employer], MD.,have documented all relevant  documentation on the behalf of Buford Dresser, MD,as directed by  Buford Dresser, MD while in the presence of Buford Dresser, MD.  ***  Signed, Buford Dresser, MD PhD 03/27/2021 3:13 PM    [place]

## 2021-03-28 ENCOUNTER — Ambulatory Visit: Payer: BC Managed Care – PPO | Admitting: Cardiology

## 2021-06-22 ENCOUNTER — Other Ambulatory Visit: Payer: Self-pay

## 2021-09-01 ENCOUNTER — Encounter (HOSPITAL_COMMUNITY): Payer: Self-pay | Admitting: Emergency Medicine

## 2021-09-01 ENCOUNTER — Emergency Department (HOSPITAL_COMMUNITY)
Admission: EM | Admit: 2021-09-01 | Discharge: 2021-09-01 | Disposition: A | Discharge status: Home / Self Care | Payer: BC Managed Care – PPO | Attending: Emergency Medicine | Admitting: Emergency Medicine

## 2021-09-01 DIAGNOSIS — E1142 Type 2 diabetes mellitus with diabetic polyneuropathy: Secondary | ICD-10-CM | POA: Insufficient documentation

## 2021-09-01 DIAGNOSIS — Z79899 Other long term (current) drug therapy: Secondary | ICD-10-CM | POA: Insufficient documentation

## 2021-09-01 DIAGNOSIS — J029 Acute pharyngitis, unspecified: Secondary | ICD-10-CM

## 2021-09-01 DIAGNOSIS — F17200 Nicotine dependence, unspecified, uncomplicated: Secondary | ICD-10-CM | POA: Insufficient documentation

## 2021-09-01 DIAGNOSIS — Z7984 Long term (current) use of oral hypoglycemic drugs: Secondary | ICD-10-CM | POA: Insufficient documentation

## 2021-09-01 DIAGNOSIS — J069 Acute upper respiratory infection, unspecified: Secondary | ICD-10-CM | POA: Diagnosis not present

## 2021-09-01 DIAGNOSIS — Z7982 Long term (current) use of aspirin: Secondary | ICD-10-CM | POA: Insufficient documentation

## 2021-09-01 DIAGNOSIS — M542 Cervicalgia: Secondary | ICD-10-CM | POA: Insufficient documentation

## 2021-09-01 DIAGNOSIS — Z20822 Contact with and (suspected) exposure to covid-19: Secondary | ICD-10-CM | POA: Diagnosis not present

## 2021-09-01 DIAGNOSIS — I10 Essential (primary) hypertension: Secondary | ICD-10-CM | POA: Insufficient documentation

## 2021-09-01 LAB — RESP PANEL BY RT-PCR (FLU A&B, COVID) ARPGX2
Influenza A by PCR: NEGATIVE
Influenza B by PCR: NEGATIVE
SARS Coronavirus 2 by RT PCR: NEGATIVE

## 2021-09-01 LAB — GROUP A STREP BY PCR: Group A Strep by PCR: NOT DETECTED

## 2021-09-01 MED ORDER — ONDANSETRON 4 MG PO TBDP: 4.0000 mg | ORAL_TABLET | Freq: Once | ORAL | Status: DC

## 2021-09-01 MED ORDER — ONDANSETRON HCL 4 MG PO TABS: 4.0000 mg | ORAL_TABLET | ORAL | 0 refills | Status: DC | PRN

## 2021-09-01 NOTE — ED Triage Notes (Signed)
Pt reports sore throat and L sided neck pain since last night.  Denies cough.

## 2021-09-01 NOTE — ED Provider Notes (Signed)
[facility] EMERGENCY DEPARTMENT Provider Note   CSN: [id number] Arrival date & time: 09/01/21  1027     History Chief Complaint  Patient presents with   Sore Throat    Vanessa Case is a 50 y.o. female with medical history of Crohns who presents with sore throat and left neck pain beginning 6PM day prior. Patient states she was sitting at home watching TV when her symptoms suddenly came on. Patient states she has taken 2000mg  of tylenol to treat her symptoms and they  somewhat dissipate but symptoms return when medication wears off. Patient denies any fever, nausea, vomiting, headaches, trouble swallowing or issues handling secretions. Patient endorses ongoing nausea. Patient denies any sick contacts.    Sore Throat Pertinent negatives include no chest pain, no abdominal pain, no headaches and no shortness of breath.      Past Medical History:  Diagnosis Date   Anemia    Anxiety    Arthritis    Chronic abdominal pain    Chronic back pain    Chronic headache    Chronic neck pain    Depression    Diabetes mellitus    adult onset dm-maintained on glipizide and metformin, pt states fasting glucose runs 110   Fibromyalgia    GERD (gastroesophageal reflux disease)    Hyperlipidemia    Hypertension    maintained on lisinopril hct, metoprolol-134/86 at pat visit   Neuromuscular disorder ([facility])    Neuropathy    Obesity    PTSD (post-traumatic stress disorder)    Substance abuse [facility])     Patient Active Problem List   Diagnosis Date Noted   Substance abuse ([facility]) 02/08/2020   NSTEMI (non-ST elevated myocardial infarction) ([facility]) 02/05/2020   Suicidal ideation 07/20/2015   Major depressive disorder, recurrent, severe without psychotic features ([facility])    Type 2 diabetes mellitus with diabetic polyneuropathy ([facility]) 05/30/2015   Other fatigue 05/30/2015   Essential hypertension 05/30/2015   HLD (hyperlipidemia) 05/30/2015   Bipolar 2 disorder, major  depressive episode ([facility]) 09/20/2014   Cholelithiasis with acute cholecystitis 06/28/2013   Right arm weakness 03/05/2013    Past Surgical History:  Procedure Laterality Date   ABDOMINAL HYSTERECTOMY  10/30/2011   Procedure: HYSTERECTOMY ABDOMINAL;  Surgeon: 12/28/2011, MD;  Location: [facility];  Service: Gynecology;  Laterality: N/A;   CESAREAN SECTION  x3   CHOLECYSTECTOMY N/A 06/28/2013   Procedure: LAPAROSCOPIC CHOLECYSTECTOMY;  Surgeon: 08/28/2013, MD;  Location: [facility];  Service: General;  Laterality: N/A;   CORONARY STENT INTERVENTION N/A 02/07/2020   Procedure: CORONARY STENT INTERVENTION;  Surgeon: 02/09/2020, MD;  Location: [facility];  Service: Cardiovascular;  Laterality: N/A;   LEFT HEART CATH AND CORONARY ANGIOGRAPHY N/A 02/07/2020   Procedure: LEFT HEART CATH AND CORONARY ANGIOGRAPHY;  Surgeon: 02/09/2020, MD;  Location: [facility] INVAS[facility] CV LAB;  Service: Cardiovascular;  Laterality: N/A;   SALPINGOOPHORECTOMY  10/30/2011   Procedure: SALPINGO OOPHERECTOMY;  Surgeon: 12/28/2011, MD;  Location: [facility];  Service: Gynecology;  Laterality: Right;   WRIST SURGERY     carpel tunnel and tendonitis     OB History     Gravida  3   Para  3   Term  3   Preterm      AB      Living  3      SAB      IAB      Ectopic  Multiple      Live Births              Family History  Problem Relation Age of Onset   Diabetes Mother    Atrial fibrillation Mother    Cystic fibrosis Father    Diabetes Maternal Grandmother    Heart disease Maternal Grandfather     Social History   Tobacco Use   Smoking status: Every Day   Smokeless tobacco: Never   Tobacco comments:    has smoked for 14 years total - quit for 6 years at one point but has been smoking the last 3 years  Vaping Use   Vaping Use: Never used  Substance Use Topics   Alcohol use: Not Currently    Alcohol/week: 0.0 standard drinks   Drug use: Yes    Types: Cocaine, Marijuana     Comment: last used cocaine 2 days ago and marijuana 1 day ago    Home Medications Prior to Admission medications   Medication Sig Start Date End Date Taking? Authorizing Provider  ondansetron (ZOFRAN) 4 MG tablet Take 1 tablet (4 mg total) by mouth as needed for nausea or vomiting. 09/01/21  Yes Delice Bison F, PA  acetaminophen (TYLENOL) 500 MG tablet Take 1,000 mg by mouth daily as needed for mild pain.    [provider]  aspirin EC 81 MG EC tablet Take 1 tablet (81 mg total) by mouth daily. 02/09/20   Arty Baumgartner, NP  atorvastatin (LIPITOR) 80 MG tablet Take 1 tablet (80 mg total) by mouth daily. 02/09/20   Arty Baumgartner, NP  benzonatate (TESSALON) 100 MG capsule Take 1 capsule (100 mg total) by mouth every 8 (eight) hours. 12/31/20   Hyman Hopes, Margaux, PA-C  dicyclomine (BENTYL) 10 MG capsule Take 1 capsule (10 mg total) by mouth 4 (four) times daily -  before meals and at bedtime. 01/15/18   Meryl Dare, MD  diltiazem (CARDIZEM CD) 180 MG 24 hr capsule Take 1 capsule (180 mg total) by mouth daily. 02/10/21 03/12/21  Henderly, Britni A, PA-C  FLUoxetine (PROZAC) 40 MG capsule Take 40 mg by mouth daily. 12/22/19   [provider]  fluticasone (FLOVENT HFA) 44 MCG/ACT inhaler Inhale 2 puffs into the lungs 2 (two) times daily as needed (for shortness of breath). 03/14/16   Benjiman Core, MD  gabapentin (NEURONTIN) 300 MG capsule Take 300 mg by mouth 2 (two) times daily. 12/22/19   [provider]  hydrOXYzine (ATARAX/VISTARIL) 25 MG tablet Take 25 mg by mouth daily. 07/19/20   [provider]  isosorbide mononitrate (IMDUR) 30 MG 24 hr tablet Take 1 tablet (30 mg total) by mouth daily. 07/30/20   Alvira Monday, MD  metFORMIN (GLUCOPHAGE) 500 MG tablet Take 500 mg by mouth 2 (two) times daily. 12/22/19   [provider]  Multiple Vitamins-Calcium (ONE-A-DAY WOMENS FORMULA PO) Take 1 tablet by mouth daily.    [provider]   nicotine polacrilex (NICORETTE) 4 MG gum Take 1 each (4 mg total) by mouth as needed for smoking cessation. Chew 1 piece every 2 hours as needed; no more than 24 pieces a day Patient not taking: Reported on 07/30/2020 02/18/20   Corrin Parker, PA-C  nitroGLYCERIN (NITROSTAT) 0.4 MG SL tablet Place 1 tablet (0.4 mg total) under the tongue every 5 (five) minutes x 3 doses as needed for chest pain. 02/08/20   Arty Baumgartner, NP  ondansetron (ZOFRAN ODT) 4 MG disintegrating tablet Take  1 tablet (4 mg total) by mouth every 8 (eight) hours as needed for nausea or vomiting. 12/31/20   Hyman Hopes, Margaux, PA-C  prazosin (MINIPRESS) 1 MG capsule Take 1 capsule (1 mg total) by mouth at bedtime. To prevent nightmares 07/25/15   Withrow, Everardo All, FNP  QUEtiapine (SEROQUEL) 50 MG tablet Take 50 mg by mouth at bedtime.    [provider]  traZODone (DESYREL) 50 MG tablet Take 50 mg by mouth at bedtime. 01/16/20   [provider]    Allergies    Percocet [oxycodone-acetaminophen] and Ambien [zolpidem]  Review of Systems   Review of Systems  Constitutional:  Negative for chills and fever.  HENT:  Positive for postnasal drip and sore throat. Negative for congestion, dental problem, trouble swallowing and voice change.   Respiratory:  Negative for shortness of breath.   Cardiovascular:  Negative for chest pain.  Gastrointestinal:  Negative for abdominal pain, nausea and vomiting.  Genitourinary:  Negative for difficulty urinating.  Musculoskeletal:  Positive for neck pain.  Skin:  Negative for color change.  Neurological:  Negative for headaches.   Physical Exam Updated Vital Signs BP 118/83 (BP Location: Right Arm)   Pulse (!) 109   Temp 98.5 F (36.9 C) (Oral)   Resp 14   LMP 10/07/2011   SpO2 100%   Physical Exam Constitutional:      General: She is not in acute distress.    Appearance: She is not ill-appearing.  HENT:     Head: Normocephalic.     Mouth/Throat:      Mouth: Mucous membranes are moist. No oral lesions.     Pharynx: Uvula midline. Posterior oropharyngeal erythema present. No oropharyngeal exudate or uvula swelling.     Tonsils: No tonsillar exudate or tonsillar abscesses.  Neck:     Trachea: Trachea and phonation normal.  Cardiovascular:     Rate and Rhythm: Regular rhythm. Tachycardia present.     Heart sounds: No murmur heard. Pulmonary:     Effort: Pulmonary effort is normal.     Breath sounds: Normal breath sounds. No wheezing.  Abdominal:     General: Bowel sounds are normal.     Palpations: Abdomen is soft.     Tenderness: There is no abdominal tenderness.  Musculoskeletal:     Cervical back: Normal range of motion.  Lymphadenopathy:     Cervical: No cervical adenopathy.  Skin:    General: Skin is warm and dry.     Capillary Refill: Capillary refill takes less than 2 seconds.  Neurological:     General: No focal deficit present.     Mental Status: She is alert.  Psychiatric:        Mood and Affect: Mood normal.    ED Results / Procedures / Treatments   Labs (all labs ordered are listed, but only abnormal results are displayed) Labs Reviewed  RESP PANEL BY RT-PCR (FLU A&B, COVID) ARPGX2  GROUP A STREP BY PCR    EKG None  Radiology No results found.  Procedures Procedures   Medications Ordered in ED Medications  ondansetron (ZOFRAN-ODT) disintegrating tablet 4 mg (has no administration in time range)    ED Course  I have reviewed the triage vital signs and the nursing notes.  Pertinent labs & imaging results that were available during my care of the patient were reviewed by me and considered in my medical decision making (see chart for details).    MDM Rules/Calculators/A&P  49YOF presents with sore throat and left neck pain beginning 6PM day prior. Patient influenza and COVID swabs come back negative. Strep throat swab is negative as well. Patient denies any sort of fever,  cough, nausea or vomiting to indicate systemic infection. Patient is phonating appropriately, handling secretions well and has good oxygen saturation so suspicion of any abscess is also low. On inspection of throat, slight erythema present with no exudate. No cervical lymphadenopathy is appreciated on palpation of neck. Most likely diagnosis at this time is unspecified URI. Patient will be encouraged to treat any muscle aches or fevers with alternating ibuprofen and tylenol. Patient will be discharged with zofran prescription for nausea. Patient agreeable to plan. Patient stable on discharge.   Final Clinical Impression(s) / ED Diagnoses Final diagnoses:  Sore throat  Viral upper respiratory tract infection    Rx / DC Orders ED Discharge Orders          Ordered    ondansetron (ZOFRAN) 4 MG tablet  As needed        09/01/21 1427             Al Decant, [place] 09/01/21 1500    Arby Barrette, MD 09/06/21 [id number]

## 2021-09-01 NOTE — Discharge Instructions (Addendum)
Return to ED with any new or worsening symptoms such as inability to handle secretions, continued vomiting or trouble breathing Follow up with PCP in 1-2 days if symptoms have persisted Continue to hydrate yourself with electrolyte supplementation beverages such as pedialyte and gatorade  Treat sore throat and any fevers with alternating tylenol and motrin Take prescription Zofran as needed for any continued nausea

## 2021-09-01 NOTE — ED Provider Notes (Signed)
Emergency Medicine Provider Triage Evaluation Note  Vanessa Case , a 50 y.o. female  was evaluated in triage.  Pt complains of sore throat, left neck pain since last night around 6pm. Denies cough, but endorses nausea, chills. Denies fever. No recent sick contacts. Has not taken anything for pain. Can tolerate swallowing food, liquids with some pain.  Review of Systems  Positive: As above Negative: As above  Physical Exam  BP 118/83 (BP Location: Right Arm)   Pulse (!) 109   Temp 98.5 F (36.9 C) (Oral)   Resp 14   LMP 10/07/2011   SpO2 100%  Gen:   Awake, no distress   Resp:  Normal effort  MSK:   Moves extremities without difficulty  Other:  Extremely tight SCM on left side, no LA, tonsils 1+ symmetric, no exudate, no uvula deviation  Medical Decision Making  Medically screening exam initiated at 11:07 AM.  Appropriate orders placed.  Vanessa Case was informed that the remainder of the evaluation will be completed by another provider, this initial triage assessment does not replace that evaluation, and the importance of remaining in the ED until their evaluation is complete.  Sore throat   Vanessa Case 09/01/21 1108    Vanessa Manchester, MD 09/02/21 [phone]

## 2021-11-08 ENCOUNTER — Emergency Department (HOSPITAL_COMMUNITY)
Admission: EM | Admit: 2021-11-08 | Discharge: 2021-11-09 | Disposition: A | Discharge status: Home / Self Care | Payer: BC Managed Care – PPO | Attending: Emergency Medicine | Admitting: Emergency Medicine

## 2021-11-08 ENCOUNTER — Other Ambulatory Visit: Payer: Self-pay

## 2021-11-08 DIAGNOSIS — R220 Localized swelling, mass and lump, head: Secondary | ICD-10-CM | POA: Insufficient documentation

## 2021-11-08 DIAGNOSIS — Z5321 Procedure and treatment not carried out due to patient leaving prior to being seen by health care provider: Secondary | ICD-10-CM | POA: Diagnosis not present

## 2021-11-08 LAB — CBC WITH DIFFERENTIAL/PLATELET
Abs Immature Granulocytes: 0.04 10*3/uL (ref 0.00–0.07)
Basophils Absolute: 0 10*3/uL (ref 0.0–0.1)
Basophils Relative: 0 %
Eosinophils Absolute: 0.1 10*3/uL (ref 0.0–0.5)
Eosinophils Relative: 1 %
HCT: 38.4 % (ref 36.0–46.0)
Hemoglobin: 13 g/dL (ref 12.0–15.0)
Immature Granulocytes: 0 %
Lymphocytes Relative: 27 %
Lymphs Abs: 3.2 10*3/uL (ref 0.7–4.0)
MCH: 29.4 pg (ref 26.0–34.0)
MCHC: 33.9 g/dL (ref 30.0–36.0)
MCV: 86.9 fL (ref 80.0–100.0)
Monocytes Absolute: 0.7 10*3/uL (ref 0.1–1.0)
Monocytes Relative: 6 %
Neutro Abs: 8 10*3/uL — ABNORMAL HIGH (ref 1.7–7.7)
Neutrophils Relative %: 66 %
Platelets: 338 10*3/uL (ref 150–400)
RBC: 4.42 MIL/uL (ref 3.87–5.11)
RDW: 13.2 % (ref 11.5–15.5)
WBC: 12 10*3/uL — ABNORMAL HIGH (ref 4.0–10.5)
nRBC: 0 % (ref 0.0–0.2)

## 2021-11-08 LAB — BASIC METABOLIC PANEL
Anion gap: 11 (ref 5–15)
BUN: 9 mg/dL (ref 6–20)
CO2: 20 mmol/L — ABNORMAL LOW (ref 22–32)
Calcium: 10 mg/dL (ref 8.9–10.3)
Chloride: 107 mmol/L (ref 98–111)
Creatinine, Ser: 0.52 mg/dL (ref 0.44–1.00)
GFR, Estimated: >60 mL/min (ref >60)
Glucose, Bld: 121 mg/dL — ABNORMAL HIGH (ref 70–99)
Potassium: 3.4 mmol/L — ABNORMAL LOW (ref 3.5–5.1)
Sodium: 138 mmol/L (ref 135–145)

## 2021-11-08 MED ORDER — ACETAMINOPHEN 500 MG PO TABS
1000.0000 mg | ORAL_TABLET | Freq: Once | ORAL | Status: AC
  Administered 2021-11-08: 1000 mg via ORAL
  Filled 2021-11-08: qty 2

## 2021-11-08 NOTE — ED Provider Triage Note (Signed)
Emergency Medicine Provider Triage Evaluation Note  Vanessa Case , a 51 y.o. female  was evaluated in triage.  Pt complains of left sided facial pain/swelling.  States it hurts in her mouth but states she has no teeth on her left upper jaw.  Denies fever.  She is not on an ACEI.  Review of Systems  Positive: Facial pain/swelling Negative: fever  Physical Exam  BP (!) 160/100    Pulse (!) 101    Temp 98.8 F (37.1 C) (Oral)    Resp (!) 23    Ht 5\' 5"  (1.651 m)    Wt 70.3 kg    LMP 10/07/2011    SpO2 100%    BMI 25.79 kg/m  Gen:   Awake, no distress   Resp:  Normal effort  MSK:   Moves extremities without difficulty  Other:  Swelling noted to left cheek, limited dentition left upper maxilla-- single molar noted that is broken/decayed, no noted lip or tongue swelling, handling secretions well, no stridor  Medical Decision Making  Medically screening exam initiated at 10:34 PM.  Appropriate orders placed.  Vanessa Case was informed that the remainder of the evaluation will be completed by another provider, this initial triage assessment does not replace that evaluation, and the importance of remaining in the ED until their evaluation is complete.  Left sided facial pain/swelling.  Suspected likely dental source.  Will check labs.   Larene Pickett, PA-C 11/08/21 2238

## 2021-11-08 NOTE — ED Triage Notes (Signed)
Pt c/o left upper oral swelling that started last night. Pain started last Monday. Denies fevers/chills.

## 2021-11-09 NOTE — ED Notes (Signed)
LWBS 

## 2022-04-09 ENCOUNTER — Emergency Department (HOSPITAL_COMMUNITY): Payer: BC Managed Care – PPO

## 2022-04-09 ENCOUNTER — Other Ambulatory Visit: Payer: Self-pay

## 2022-04-09 ENCOUNTER — Encounter (HOSPITAL_COMMUNITY): Payer: Self-pay

## 2022-04-09 ENCOUNTER — Emergency Department (HOSPITAL_COMMUNITY)
Admission: EM | Admit: 2022-04-09 | Discharge: 2022-04-09 | Disposition: A | Discharge status: Home / Self Care | Payer: BC Managed Care – PPO | Attending: Emergency Medicine | Admitting: Emergency Medicine

## 2022-04-09 DIAGNOSIS — R079 Chest pain, unspecified: Secondary | ICD-10-CM | POA: Diagnosis not present

## 2022-04-09 DIAGNOSIS — H9202 Otalgia, left ear: Secondary | ICD-10-CM | POA: Insufficient documentation

## 2022-04-09 DIAGNOSIS — S0990XA Unspecified injury of head, initial encounter: Secondary | ICD-10-CM | POA: Diagnosis present

## 2022-04-09 DIAGNOSIS — Z7984 Long term (current) use of oral hypoglycemic drugs: Secondary | ICD-10-CM | POA: Diagnosis not present

## 2022-04-09 DIAGNOSIS — S0083XA Contusion of other part of head, initial encounter: Secondary | ICD-10-CM | POA: Diagnosis not present

## 2022-04-09 DIAGNOSIS — Z7982 Long term (current) use of aspirin: Secondary | ICD-10-CM | POA: Diagnosis not present

## 2022-04-09 LAB — BASIC METABOLIC PANEL
Anion gap: 12 (ref 5–15)
BUN: 5 mg/dL — ABNORMAL LOW (ref 6–20)
CO2: 25 mmol/L (ref 22–32)
Calcium: 10.1 mg/dL (ref 8.9–10.3)
Chloride: 107 mmol/L (ref 98–111)
Creatinine, Ser: 0.64 mg/dL (ref 0.44–1.00)
GFR, Estimated: >60 mL/min (ref >60)
Glucose, Bld: 144 mg/dL — ABNORMAL HIGH (ref 70–99)
Potassium: 3.9 mmol/L (ref 3.5–5.1)
Sodium: 144 mmol/L (ref 135–145)

## 2022-04-09 LAB — TROPONIN I (HIGH SENSITIVITY): Troponin I (High Sensitivity): 6 ng/L (ref <18)

## 2022-04-09 LAB — CBC
HCT: 37.7 % (ref 36.0–46.0)
Hemoglobin: 12.3 g/dL (ref 12.0–15.0)
MCH: 29 pg (ref 26.0–34.0)
MCHC: 32.6 g/dL (ref 30.0–36.0)
MCV: 88.9 fL (ref 80.0–100.0)
Platelets: 298 10*3/uL (ref 150–400)
RBC: 4.24 MIL/uL (ref 3.87–5.11)
RDW: 14 % (ref 11.5–15.5)
WBC: 6.7 10*3/uL (ref 4.0–10.5)
nRBC: 0 % (ref 0.0–0.2)
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
## 2022-07-24 ENCOUNTER — Emergency Department (HOSPITAL_COMMUNITY)
Admission: EM | Admit: 2022-07-24 | Discharge: 2022-07-25 | Disposition: A | Discharge status: Home / Self Care | Payer: Medicaid Other | Attending: Emergency Medicine | Admitting: Emergency Medicine

## 2022-07-24 ENCOUNTER — Emergency Department (HOSPITAL_COMMUNITY): Payer: Medicaid Other

## 2022-07-24 ENCOUNTER — Encounter (HOSPITAL_COMMUNITY): Payer: Self-pay | Admitting: Emergency Medicine

## 2022-07-24 ENCOUNTER — Ambulatory Visit (HOSPITAL_COMMUNITY)
Admission: EM | Admit: 2022-07-24 | Discharge: 2022-07-24 | Disposition: A | Discharge status: Home / Self Care | Payer: Medicaid Other | Attending: Family Medicine | Admitting: Family Medicine

## 2022-07-24 DIAGNOSIS — Z23 Encounter for immunization: Secondary | ICD-10-CM | POA: Insufficient documentation

## 2022-07-24 DIAGNOSIS — S6991XA Unspecified injury of right wrist, hand and finger(s), initial encounter: Secondary | ICD-10-CM | POA: Diagnosis present

## 2022-07-24 DIAGNOSIS — Z7982 Long term (current) use of aspirin: Secondary | ICD-10-CM | POA: Diagnosis not present

## 2022-07-24 DIAGNOSIS — W5501XA Bitten by cat, initial encounter: Secondary | ICD-10-CM | POA: Insufficient documentation

## 2022-07-24 DIAGNOSIS — S61431A Puncture wound without foreign body of right hand, initial encounter: Secondary | ICD-10-CM | POA: Insufficient documentation

## 2022-07-24 DIAGNOSIS — Z2914 Encounter for prophylactic rabies immune globin: Secondary | ICD-10-CM | POA: Diagnosis not present

## 2022-07-24 MED ORDER — TETANUS-DIPHTH-ACELL PERTUSSIS 5-2.5-18.5 LF-MCG/0.5 IM SUSY
0.5000 mL | PREFILLED_SYRINGE | Freq: Once | INTRAMUSCULAR | Status: AC
  Administered 2022-07-25: 0.5 mL via INTRAMUSCULAR
  Filled 2022-07-24: qty 0.5

## 2022-07-24 NOTE — ED Notes (Signed)
Patient is being discharged from the Urgent Care and sent to the Emergency Department via personal vehicle . Per Dr Windy Carina, patient is in need of higher level of care due to Initial treatment for animal bite. Patient is aware and verbalizes understanding of plan of care.  Vitals:   07/24/22 1408  BP: (!) 143/87  Pulse: 98  Resp: 16  Temp: 99.5 F (37.5 C)  SpO2: 98%

## 2022-07-24 NOTE — ED Provider Triage Note (Signed)
Emergency Medicine Provider Triage Evaluation Note  Vanessa Case , a 51 y.o. female  was evaluated in triage.  Pt complains of bite to her right hand.  Occurred at 9 AM this morning.  No erythema or warmth.  Has noted some swelling.  Unknown last tetanus.  Cat was not up-to-date on rabies.  No numbness or weakness.  Review of Systems  Positive: Cat bite Negative:   Physical Exam  BP 136/77 (BP Location: Left Arm)   Pulse 84   Temp 98.2 F (36.8 C) (Oral)   Resp 17   LMP 10/07/2011   SpO2 100%  Gen:   Awake, no distress   Resp:  Normal effort  MSK:   Moves extremities without difficulty  Skin:  Unction wounds to right hand and wrist.  Full range of motion Other:    Medical Decision Making  Medically screening exam initiated at 3:45 PM.  Appropriate orders placed.  Vanessa Case was informed that the remainder of the evaluation will be completed by another provider, this initial triage assessment does not replace that evaluation, and the importance of remaining in the ED until their evaluation is complete.  Cat bite   Velta Rockholt A, PA-C 07/24/22 1546
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
## 2022-07-25 MED ORDER — RABIES IMMUNE GLOBULIN 150 UNIT/ML IM INJ
20.0000 [IU]/kg | INJECTION | Freq: Once | INTRAMUSCULAR | Status: AC
  Administered 2022-07-25: 1425 [IU] via INTRAMUSCULAR
  Filled 2022-07-25: qty 10

## 2022-07-25 MED ORDER — OXYCODONE HCL 5 MG PO TABS: 5.0000 mg | ORAL_TABLET | Freq: Four times a day (QID) | ORAL | 0 refills | Status: DC | PRN

## 2022-07-25 MED ORDER — OXYCODONE HCL 5 MG PO TABS
5.0000 mg | ORAL_TABLET | Freq: Once | ORAL | Status: AC
  Administered 2022-07-25: 5 mg via ORAL
  Filled 2022-07-25: qty 1

## 2022-07-25 MED ORDER — AMOXICILLIN-POT CLAVULANATE 875-125 MG PO TABS
1.0000 | ORAL_TABLET | Freq: Once | ORAL | Status: AC
  Administered 2022-07-25: 1 via ORAL
  Filled 2022-07-25: qty 1

## 2022-07-25 MED ORDER — RABIES VACCINE, PCEC IM SUSR
1.0000 mL | Freq: Once | INTRAMUSCULAR | Status: AC
  Administered 2022-07-25: 1 mL via INTRAMUSCULAR
  Filled 2022-07-25: qty 1

## 2022-07-25 MED ORDER — AMOXICILLIN-POT CLAVULANATE 875-125 MG PO TABS: 1.0000 | ORAL_TABLET | Freq: Two times a day (BID) | ORAL | 0 refills | Status: AC

## 2022-07-25 NOTE — Discharge Instructions (Addendum)
If you have not been vaccinated against rabies in the past, you need 4 doses of rabies vaccine over 2 weeks (given on days 0, 3, 7, and 14).   You have been given your first dose today. Please call [place] urgent care to arrange appointment for subsequent vaccines.   Take antibiotic as prescribed.  Please return if you develop any signs of worsening infection.  Recommend Tylenol and ibuprofen for pain.  I have written you for narcotic pain medicine as well to take for breakthrough pain.  This medication is sedating so do not use with alcohol or other drugs or dangerous activities including driving.

## 2022-07-25 NOTE — ED Provider Notes (Signed)
[facility] EMERGENCY DEPARTMENT Provider Note   CSN: [id number] Arrival date & time: 07/24/22  1430     History  Chief Complaint  Patient presents with   Wound Check    Vanessa Case is a 51 y.o. female.  Patient here with cat bite to her right hand.  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  Not in custody.  Does not know vaccination history.  She is not sure if her tetanus is up-to-date.  She is having discomfort.  Denies any fevers or chills.  History of chronic pain, substance abuse, fibromyalgia.  Nothing makes it worse or better.  Denies any numbness, tingling, weakness.  The history is provided by the patient.       Home Medications Prior to Admission medications   Medication Sig Start Date End Date Taking? Authorizing Provider  amoxicillin-clavulanate (AUGMENTIN) 875-125 MG tablet Take 1 tablet by mouth every 12 (twelve) hours for 7 days. 07/25/22 08/01/22 Yes Lalah Durango, DO  oxyCODONE (ROXICODONE) 5 MG immediate release tablet Take 1 tablet (5 mg total) by mouth every 6 (six) hours as needed for up to 10 doses for breakthrough pain. 07/25/22  Yes Renad Jenniges, DO  acetaminophen (TYLENOL) 500 MG tablet Take 1,000 mg by mouth daily as needed for mild pain.    [provider]  aspirin EC 81 MG EC tablet Take 1 tablet (81 mg total) by mouth daily. 02/09/20   Arty Baumgartner, NP  atorvastatin (LIPITOR) 80 MG tablet Take 1 tablet (80 mg total) by mouth daily. 02/09/20   Arty Baumgartner, NP  benzonatate (TESSALON) 100 MG capsule Take 1 capsule (100 mg total) by mouth every 8 (eight) hours. Patient not taking: Reported on 11/08/2021 12/31/20   Tanda Rockers, PA-C  BRILINTA 90 MG TABS tablet Take 90 mg by mouth 2 (two) times daily. 07/06/21   [provider]  dicyclomine (BENTYL) 10 MG capsule Take 1 capsule (10 mg total) by mouth 4 (four) times daily -  before meals and at bedtime. 01/15/18   Meryl Dare, MD  DILT-XR 180 MG 24 hr capsule Take  180 mg by mouth daily. 01/21/22   [provider]  diltiazem (CARDIZEM CD) 180 MG 24 hr capsule Take 1 capsule (180 mg total) by mouth daily. 02/10/21 11/08/21  Henderly, Britni A, PA-C  FLUoxetine (PROZAC) 40 MG capsule Take 40 mg by mouth daily. 12/22/19   [provider]  fluticasone (FLOVENT HFA) 44 MCG/ACT inhaler Inhale 2 puffs into the lungs 2 (two) times daily as needed (for shortness of breath). 03/14/16   Benjiman Core, MD  gabapentin (NEURONTIN) 300 MG capsule Take 300 mg by mouth 2 (two) times daily. 12/22/19   [provider]  hydrOXYzine (ATARAX/VISTARIL) 25 MG tablet Take 25 mg by mouth daily. 07/19/20   [provider]  isosorbide mononitrate (IMDUR) 30 MG 24 hr tablet Take 1 tablet (30 mg total) by mouth daily. Patient not taking: Reported on 11/08/2021 07/30/20   Alvira Monday, MD  metFORMIN (GLUCOPHAGE) 1000 MG tablet Take 1,000 mg by mouth 2 (two) times daily with a meal.    [provider]  Multiple Vitamins-Calcium (ONE-A-DAY WOMENS FORMULA PO) Take 1 tablet by mouth daily.    [provider]  nicotine polacrilex (NICORETTE) 4 MG gum Take 1 each (4 mg total) by mouth as needed for smoking cessation. Chew 1 piece every 2 hours as needed; no more than 24 pieces a day Patient not taking: Reported on  07/30/2020 02/18/20   Sande Rives E, PA-C  nitroGLYCERIN (NITROSTAT) 0.4 MG SL tablet Place 1 tablet (0.4 mg total) under the tongue every 5 (five) minutes x 3 doses as needed for chest pain. 02/08/20   Cheryln Manly, NP  ondansetron (ZOFRAN ODT) 4 MG disintegrating tablet Take 1 tablet (4 mg total) by mouth every 8 (eight) hours as needed for nausea or vomiting. Patient not taking: Reported on 11/08/2021 12/31/20   Eustaquio Maize, PA-C  ondansetron (ZOFRAN) 4 MG tablet Take 1 tablet (4 mg total) by mouth as needed for nausea or vomiting. Patient not taking: Reported on 11/08/2021 09/01/21   Azucena Cecil, PA-C  OZEMPIC,  0.25 OR 0.5 MG/DOSE, 2 MG/1.5ML SOPN Inject 0.5 mg into the skin once a week. 11/03/21   [provider]  prazosin (MINIPRESS) 1 MG capsule Take 1 capsule (1 mg total) by mouth at bedtime. To prevent nightmares 07/25/15   Withrow, Elyse Jarvis, FNP  QUEtiapine (SEROQUEL) 50 MG tablet Take 50 mg by mouth at bedtime.    [provider]  traZODone (DESYREL) 50 MG tablet Take 50 mg by mouth at bedtime. 01/16/20   [provider]      Allergies    Percocet [oxycodone-acetaminophen] and Ambien [zolpidem]    Review of Systems   Review of Systems  Physical Exam Updated Vital Signs BP (!) 163/90 (BP Location: Left Arm)   Pulse 77   Temp 98.2 F (36.8 C)   Resp 16   LMP 10/07/2011   SpO2 100%  Physical Exam Vitals and nursing note reviewed.  Constitutional:      General: She is not in acute distress.    Appearance: She is well-developed.  HENT:     Head: Normocephalic and atraumatic.  Eyes:     Conjunctiva/sclera: Conjunctivae normal.  Cardiovascular:     Rate and Rhythm: Normal rate and regular rhythm.     Pulses: Normal pulses.     Heart sounds: No murmur heard. Pulmonary:     Effort: Pulmonary effort is normal. No respiratory distress.     Breath sounds: Normal breath sounds.  Abdominal:     Palpations: Abdomen is soft.     Tenderness: There is no abdominal tenderness.  Musculoskeletal:        General: No swelling.     Cervical back: Neck supple.  Skin:    General: Skin is warm and dry.     Capillary Refill: Capillary refill takes less than 2 seconds.     Comments: Multiple puncture wounds to the right hand but there is no purulent drainage or abscess or major erythema  Neurological:     Mental Status: She is alert.  Psychiatric:        Mood and Affect: Mood normal.     ED Results / Procedures / Treatments   Labs (all labs ordered are listed, but only abnormal results are displayed) Labs Reviewed - No data to display  EKG None  Radiology DG  Hand Complete Right  Result Date: 07/24/2022 CLINICAL DATA:  Cat bite. EXAM: RIGHT HAND - COMPLETE 3+ VIEW COMPARISON:  None Available. FINDINGS: There is no evidence of fracture or dislocation. There is no evidence of arthropathy or other focal bone abnormality. Soft tissues are unremarkable. IMPRESSION: Negative. Electronically Signed   By: Marijo Conception M.D.   On: 07/24/2022 16:25    Procedures Procedures    Medications Ordered in ED Medications  Tdap (BOOSTRIX) injection 0.5 mL (has no administration  in time range)  amoxicillin-clavulanate (AUGMENTIN) 875-125 MG per tablet 1 tablet (has no administration in time range)  rabies immune globulin (HYPERRAB/KEDRAB) injection 20 Units/kg (has no administration in time range)  rabies vaccine (RABAVERT) injection 1 mL (has no administration in time range)  oxyCODONE (Oxy IR/ROXICODONE) immediate release tablet 5 mg (has no administration in time range)    ED Course/ Medical Decision Making/ A&P                           Medical Decision Making Risk Prescription drug management.   Kaelin Andreana Klingerman is here with cat bite to the right hand.  Unknown vaccine history of the cat.  Not sure if her tetanus shot is up-to-date.  She is neurovascular neuromuscular intact on exam.  Stray of the right hand per my review and interpretation shows no fracture or injury.  She has normal vitals.  Well-appearing.  Will give rabies vaccine and immunoglobulin.  Will give tetanus shot, Augmentin, Roxicodone.  Will prescribe Roxicodone and Augmentin for pain control.  She understands return precautions.  Discharged in good condition.  This chart was dictated using voice recognition software.  Despite best efforts to proofread,  errors can occur which can change the documentation meaning.         Final Clinical Impression(s) / ED Diagnoses Final diagnoses:  Cat bite, initial encounter    Rx / DC Orders ED Discharge Orders          Ordered     amoxicillin-clavulanate (AUGMENTIN) 875-125 MG tablet  Every 12 hours        07/25/22 0855    oxyCODONE (ROXICODONE) 5 MG immediate release tablet  Every 6 hours PRN        07/25/22 0855              Virgina Norfolk, DO 07/25/22 7903

## 2022-08-07 ENCOUNTER — Encounter (HOSPITAL_COMMUNITY): Payer: Self-pay

## 2022-08-07 ENCOUNTER — Ambulatory Visit (HOSPITAL_COMMUNITY)
Admission: RE | Admit: 2022-08-07 | Discharge: 2022-08-07 | Disposition: A | Discharge status: Home / Self Care | Payer: Medicaid Other | Source: Ambulatory Visit | Attending: Internal Medicine | Admitting: Internal Medicine

## 2022-08-07 DIAGNOSIS — Z203 Contact with and (suspected) exposure to rabies: Secondary | ICD-10-CM | POA: Diagnosis not present

## 2022-08-07 MED ORDER — RABIES VACCINE, PCEC IM SUSR
1.0000 mL | Freq: Once | INTRAMUSCULAR | Status: AC
  Administered 2022-08-07: 1 mL via INTRAMUSCULAR

## 2022-08-07 MED ORDER — RABIES VACCINE, PCEC IM SUSR
INTRAMUSCULAR | Status: AC
  Filled 2022-08-07: qty 1

## 2022-08-07 NOTE — ED Triage Notes (Signed)
Pt presents to UC for 2nd rabies injection.

## 2022-08-09 ENCOUNTER — Encounter (HOSPITAL_BASED_OUTPATIENT_CLINIC_OR_DEPARTMENT_OTHER): Payer: Self-pay | Admitting: Emergency Medicine

## 2022-08-09 ENCOUNTER — Other Ambulatory Visit: Payer: Self-pay

## 2022-08-09 ENCOUNTER — Emergency Department (HOSPITAL_BASED_OUTPATIENT_CLINIC_OR_DEPARTMENT_OTHER)
Admission: EM | Admit: 2022-08-09 | Discharge: 2022-08-09 | Disposition: A | Discharge status: Home / Self Care | Payer: Medicaid Other | Attending: Emergency Medicine | Admitting: Emergency Medicine

## 2022-08-09 DIAGNOSIS — T148XXA Other injury of unspecified body region, initial encounter: Secondary | ICD-10-CM

## 2022-08-09 DIAGNOSIS — W5501XA Bitten by cat, initial encounter: Secondary | ICD-10-CM | POA: Insufficient documentation

## 2022-08-09 DIAGNOSIS — S61051A Open bite of right thumb without damage to nail, initial encounter: Secondary | ICD-10-CM | POA: Diagnosis not present

## 2022-08-09 DIAGNOSIS — S61451A Open bite of right hand, initial encounter: Secondary | ICD-10-CM | POA: Diagnosis present

## 2022-08-09 DIAGNOSIS — Z7982 Long term (current) use of aspirin: Secondary | ICD-10-CM | POA: Diagnosis not present

## 2022-08-09 DIAGNOSIS — S6991XA Unspecified injury of right wrist, hand and finger(s), initial encounter: Secondary | ICD-10-CM

## 2022-08-09 MED ORDER — OXYCODONE-ACETAMINOPHEN 5-325 MG PO TABS: 1.0000 | ORAL_TABLET | Freq: Four times a day (QID) | ORAL | 0 refills | Status: DC | PRN

## 2022-08-09 MED ORDER — OXYCODONE-ACETAMINOPHEN 5-325 MG PO TABS
1.0000 | ORAL_TABLET | Freq: Once | ORAL | Status: AC
  Administered 2022-08-09: 1 via ORAL
  Filled 2022-08-09: qty 1

## 2022-08-09 NOTE — Discharge Instructions (Signed)
Please contact the orthopedic hand surgery doctor if you notice persistent difficulty with range of motion of your thumb.  Pain medicine prescribed -but please be aware, emergency room physicians are not allowed to treat long-lasting pain with narcotic medicine.  Please try to get your PCP soon as possible.

## 2022-08-09 NOTE — ED Provider Notes (Addendum)
[facility] EMERGENCY DEPT Provider Note   CSN: [id number] Arrival date & time: 08/09/22  5277     History  Chief Complaint  Patient presents with   Animal Bite    Vanessa Case is a 51 y.o. female.  HPI    51 year old female comes in with chief complaint of animal bite.  Patient was bit on her right hand by a rabid appearing cat on 10-4.  She has finished her dose of Augmentin.  She is also finished rabies shot.  She comes into the ER because she ran out of her pain medicine 3 days ago.  She has been taking Tylenol 1000 mg and also NSAIDs with transient and minimal relief.  She is unable to work because of pain.  Patient does not have a PCP as she switched from [facility] to [facility].  She also indicates that she is having difficulty with certain movement of the thumb.  Home Medications Prior to Admission medications   Medication Sig Start Date End Date Taking? Authorizing Provider  oxyCODONE-acetaminophen (PERCOCET/ROXICET) 5-325 MG tablet Take 1 tablet by mouth every 6 (six) hours as needed for severe pain. 08/09/22  Yes Derwood Kaplan, MD  acetaminophen (TYLENOL) 500 MG tablet Take 1,000 mg by mouth daily as needed for mild pain.    [provider]  aspirin EC 81 MG EC tablet Take 1 tablet (81 mg total) by mouth daily. 02/09/20   Arty Baumgartner, NP  atorvastatin (LIPITOR) 80 MG tablet Take 1 tablet (80 mg total) by mouth daily. 02/09/20   Arty Baumgartner, NP  benzonatate (TESSALON) 100 MG capsule Take 1 capsule (100 mg total) by mouth every 8 (eight) hours. Patient not taking: Reported on 11/08/2021 12/31/20   Tanda Rockers, PA-C  BRILINTA 90 MG TABS tablet Take 90 mg by mouth 2 (two) times daily. 07/06/21   [provider]  dicyclomine (BENTYL) 10 MG capsule Take 1 capsule (10 mg total) by mouth 4 (four) times daily -  before meals and at bedtime. 01/15/18   Meryl Dare, MD  DILT-XR 180 MG 24 hr capsule Take 180 mg by mouth  daily. 01/21/22   [provider]  diltiazem (CARDIZEM CD) 180 MG 24 hr capsule Take 1 capsule (180 mg total) by mouth daily. 02/10/21 11/08/21  Henderly, Britni A, PA-C  FLUoxetine (PROZAC) 40 MG capsule Take 40 mg by mouth daily. 12/22/19   [provider]  fluticasone (FLOVENT HFA) 44 MCG/ACT inhaler Inhale 2 puffs into the lungs 2 (two) times daily as needed (for shortness of breath). 03/14/16   Benjiman Core, MD  gabapentin (NEURONTIN) 300 MG capsule Take 300 mg by mouth 2 (two) times daily. 12/22/19   [provider]  hydrOXYzine (ATARAX/VISTARIL) 25 MG tablet Take 25 mg by mouth daily. 07/19/20   [provider]  isosorbide mononitrate (IMDUR) 30 MG 24 hr tablet Take 1 tablet (30 mg total) by mouth daily. Patient not taking: Reported on 11/08/2021 07/30/20   Alvira Monday, MD  metFORMIN (GLUCOPHAGE) 1000 MG tablet Take 1,000 mg by mouth 2 (two) times daily with a meal.    [provider]  Multiple Vitamins-Calcium (ONE-A-DAY WOMENS FORMULA PO) Take 1 tablet by mouth daily.    [provider]  nicotine polacrilex (NICORETTE) 4 MG gum Take 1 each (4 mg total) by mouth as needed for smoking cessation. Chew 1 piece every 2 hours as needed; no more than 24 pieces a day Patient not taking: Reported on  07/30/2020 02/18/20   Marjie Skiff E, PA-C  nitroGLYCERIN (NITROSTAT) 0.4 MG SL tablet Place 1 tablet (0.4 mg total) under the tongue every 5 (five) minutes x 3 doses as needed for chest pain. 02/08/20   Arty Baumgartner, NP  ondansetron (ZOFRAN ODT) 4 MG disintegrating tablet Take 1 tablet (4 mg total) by mouth every 8 (eight) hours as needed for nausea or vomiting. Patient not taking: Reported on 11/08/2021 12/31/20   Tanda Rockers, PA-C  ondansetron (ZOFRAN) 4 MG tablet Take 1 tablet (4 mg total) by mouth as needed for nausea or vomiting. Patient not taking: Reported on 11/08/2021 09/01/21   Al Decant, PA-C  oxyCODONE (ROXICODONE) 5 MG  immediate release tablet Take 1 tablet (5 mg total) by mouth every 6 (six) hours as needed for up to 10 doses for breakthrough pain. 07/25/22   Curatolo, Adam, DO  OZEMPIC, 0.25 OR 0.5 MG/DOSE, 2 MG/1.5ML SOPN Inject 0.5 mg into the skin once a week. 11/03/21   [provider]  prazosin (MINIPRESS) 1 MG capsule Take 1 capsule (1 mg total) by mouth at bedtime. To prevent nightmares 07/25/15   Withrow, Everardo All, FNP  QUEtiapine (SEROQUEL) 50 MG tablet Take 50 mg by mouth at bedtime.    [provider]  traZODone (DESYREL) 50 MG tablet Take 50 mg by mouth at bedtime. 01/16/20   [provider]      Allergies    Percocet [oxycodone-acetaminophen] and Ambien [zolpidem]    Review of Systems   Review of Systems  Physical Exam Updated Vital Signs BP (!) 185/98 (BP Location: Right Arm)   Pulse 97   Temp 97.8 F (36.6 C) (Temporal)   Resp 15   Ht 5\' 5"  (1.651 m)   Wt 71.2 kg   LMP 10/07/2011   SpO2 100%   BMI 26.13 kg/m  Physical Exam Vitals and nursing note reviewed.  Constitutional:      Appearance: She is well-developed.  HENT:     Head: Atraumatic.  Eyes:     Extraocular Movements: Extraocular movements intact.     Pupils: Pupils are equal, round, and reactive to light.  Cardiovascular:     Rate and Rhythm: Normal rate.  Pulmonary:     Effort: Pulmonary effort is normal.  Musculoskeletal:     Cervical back: Normal range of motion and neck supple.     Comments: Patient's right thumb is slightly edematous compared to the left side. No erythema, no warmth to touch. Patient is noted to have compromise with the following range of motion of the thumb: Opposition, extension and abduction  Skin:    General: Skin is warm and dry.  Neurological:     Mental Status: She is alert and oriented to person, place, and time.     ED Results / Procedures / Treatments   Labs (all labs ordered are listed, but only abnormal results are displayed) Labs Reviewed - No data  to display  EKG None  Radiology No results found.  Procedures Procedures    Medications Ordered in ED Medications  oxyCODONE-acetaminophen (PERCOCET/ROXICET) 5-325 MG per tablet 1 tablet (has no administration in time range)    ED Course/ Medical Decision Making/ A&P                           Medical Decision Making Problems Addressed: Animal bite: complicated acute illness or injury Hand injury, right, initial encounter: complicated acute illness or injury  Risk Prescription drug management.  51 year old female comes in with chief complaint of persistent pain and difficulty with range of motion of her right thumb.  Patient was bit by an animal on 10-4.  She was treated with Augmentin and given pain medicine.  She has finished her rabies course.  She has finished the antibiotics.  On exam, patient has difficulty with certain range of motion of the thumb.  She has no signs of infection.  Differential diagnosis considered includes deep space infection of the thumb, cellulitis, septic arthritis.  However clinical suspicion for all of those is extremely low.  Other possibilities include tendinopathy, tendinitis.  Patient does not have a PCP, we will have to send her to hand surgery.  She will likely need physical therapy/Occupational Therapy for hand range of motion.  She is right-handed dominant.  We will also give her some additional narcotic medicine.    [place] controlled substance database reviewed -low narcotic score. Patient's previous visit and previous imaging of the extremity reviewed.  No foreign body.  Final Clinical Impression(s) / ED Diagnoses Final diagnoses:  Hand injury, right, initial encounter  Animal bite    Rx / DC Orders ED Discharge Orders          Ordered    oxyCODONE-acetaminophen (PERCOCET/ROXICET) 5-325 MG tablet  Every 6 hours PRN        08/09/22 0859              Varney Biles, MD 08/09/22 3810    Varney Biles,  MD 08/09/22 1751

## 2022-08-09 NOTE — ED Triage Notes (Signed)
Pt arrives to ED with c/o right sided hand pain since getting bit by a cat. She has had her first and second rabies shot.

## 2022-08-13 ENCOUNTER — Telehealth (HOSPITAL_BASED_OUTPATIENT_CLINIC_OR_DEPARTMENT_OTHER): Payer: Self-pay | Admitting: Cardiology

## 2022-08-13 NOTE — Telephone Encounter (Signed)
Pt c/o BP issue: STAT if pt c/o blurred vision, one-sided weakness or slurred speech  1. What are your last 5 BP readings? 187/118  2. Are you having any other symptoms (ex. Dizziness, headache, blurred vision, passed out)? Headache dizziness, and blurry vision   3. What is your BP issue? To strong

## 2022-08-13 NOTE — Telephone Encounter (Signed)
Attempted to return call to patient, call could not be completed, will send follow up mychart message asking patient for more information.

## 2022-11-19 ENCOUNTER — Emergency Department (HOSPITAL_COMMUNITY)
Admission: EM | Admit: 2022-11-19 | Discharge: 2022-11-19 | Disposition: A | Discharge status: Home / Self Care | Payer: Medicaid Other | Attending: Emergency Medicine | Admitting: Emergency Medicine

## 2022-11-19 ENCOUNTER — Emergency Department (HOSPITAL_COMMUNITY): Payer: Medicaid Other

## 2022-11-19 ENCOUNTER — Encounter (HOSPITAL_COMMUNITY): Payer: Self-pay

## 2022-11-19 ENCOUNTER — Other Ambulatory Visit: Payer: Self-pay

## 2022-11-19 DIAGNOSIS — M545 Low back pain, unspecified: Secondary | ICD-10-CM | POA: Diagnosis not present

## 2022-11-19 DIAGNOSIS — M546 Pain in thoracic spine: Secondary | ICD-10-CM | POA: Insufficient documentation

## 2022-11-19 DIAGNOSIS — Z7982 Long term (current) use of aspirin: Secondary | ICD-10-CM | POA: Diagnosis not present

## 2022-11-19 DIAGNOSIS — R109 Unspecified abdominal pain: Secondary | ICD-10-CM | POA: Insufficient documentation

## 2022-11-19 LAB — CBC WITH DIFFERENTIAL/PLATELET
Abs Immature Granulocytes: 0.02 10*3/uL (ref 0.00–0.07)
Basophils Absolute: 0 10*3/uL (ref 0.0–0.1)
Basophils Relative: 1 %
Eosinophils Absolute: 0.1 10*3/uL (ref 0.0–0.5)
Eosinophils Relative: 1 %
HCT: 44.3 % (ref 36.0–46.0)
Hemoglobin: 14.1 g/dL (ref 12.0–15.0)
Immature Granulocytes: 0 %
Lymphocytes Relative: 31 %
Lymphs Abs: 2.3 10*3/uL (ref 0.7–4.0)
MCH: 28.7 pg (ref 26.0–34.0)
MCHC: 31.8 g/dL (ref 30.0–36.0)
MCV: 90 fL (ref 80.0–100.0)
Monocytes Absolute: 0.4 10*3/uL (ref 0.1–1.0)
Monocytes Relative: 6 %
Neutro Abs: 4.4 10*3/uL (ref 1.7–7.7)
Neutrophils Relative %: 61 %
Platelets: 258 10*3/uL (ref 150–400)
RBC: 4.92 MIL/uL (ref 3.87–5.11)
RDW: 12.8 % (ref 11.5–15.5)
WBC: 7.2 10*3/uL (ref 4.0–10.5)
nRBC: 0 % (ref 0.0–0.2)

## 2022-11-19 LAB — COMPREHENSIVE METABOLIC PANEL
ALT: 14 U/L (ref 0–44)
AST: 20 U/L (ref 15–41)
Albumin: 4.5 g/dL (ref 3.5–5.0)
Alkaline Phosphatase: 73 U/L (ref 38–126)
Anion gap: 11 (ref 5–15)
BUN: 8 mg/dL (ref 6–20)
CO2: 24 mmol/L (ref 22–32)
Calcium: 10.2 mg/dL (ref 8.9–10.3)
Chloride: 101 mmol/L (ref 98–111)
Creatinine, Ser: 0.76 mg/dL (ref 0.44–1.00)
GFR, Estimated: >60 mL/min (ref >60)
Glucose, Bld: 185 mg/dL — ABNORMAL HIGH (ref 70–99)
Potassium: 3.6 mmol/L (ref 3.5–5.1)
Sodium: 136 mmol/L (ref 135–145)
Total Bilirubin: 0.4 mg/dL (ref 0.3–1.2)
Total Protein: 8.3 g/dL — ABNORMAL HIGH (ref 6.5–8.1)

## 2022-11-19 LAB — URINALYSIS, ROUTINE W REFLEX MICROSCOPIC
Bilirubin Urine: NEGATIVE
Glucose, UA: NEGATIVE mg/dL
Hgb urine dipstick: NEGATIVE
Ketones, ur: 5 mg/dL — AB
Leukocytes,Ua: NEGATIVE
Nitrite: NEGATIVE
Protein, ur: NEGATIVE mg/dL
Specific Gravity, Urine: 1.019 (ref 1.005–1.030)
pH: 5 (ref 5.0–8.0)

## 2022-11-19 LAB — LIPASE, BLOOD: Lipase: 30 U/L (ref 11–51)

## 2022-11-19 MED ORDER — FENTANYL CITRATE PF 50 MCG/ML IJ SOSY
50.0000 ug | PREFILLED_SYRINGE | Freq: Once | INTRAMUSCULAR | Status: AC
  Administered 2022-11-19: 50 ug via INTRAMUSCULAR
  Filled 2022-11-19: qty 1

## 2022-11-19 MED ORDER — CYCLOBENZAPRINE HCL 10 MG PO TABS: 10.0000 mg | ORAL_TABLET | Freq: Two times a day (BID) | ORAL | 0 refills | Status: DC | PRN

## 2022-11-19 MED ORDER — ACETAMINOPHEN 325 MG PO TABS
650.0000 mg | ORAL_TABLET | Freq: Once | ORAL | Status: AC
  Administered 2022-11-19: 650 mg via ORAL
  Filled 2022-11-19: qty 2

## 2022-11-19 MED ORDER — KETOROLAC TROMETHAMINE 30 MG/ML IJ SOLN
60.0000 mg | Freq: Once | INTRAMUSCULAR | Status: AC
  Administered 2022-11-19: 60 mg via INTRAMUSCULAR
  Filled 2022-11-19: qty 2

## 2022-11-19 MED ORDER — DIAZEPAM 2 MG PO TABS
2.0000 mg | ORAL_TABLET | Freq: Once | ORAL | Status: AC
Start: 2022-11-19 — End: 2022-11-19
  Administered 2022-11-19: 2 mg via ORAL
  Filled 2022-11-19: qty 1

## 2022-11-19 NOTE — Discharge Instructions (Addendum)
You are seen in the emergency department today for low back pain.  Given that all your labs and imaging were reassuring at this time, you should plan to follow-up with her primary care provider for further evaluation if needed if symptoms not improving.  You should try to manage symptoms at home as best as you can with good hydration, over-the-counter pain medication such as Tylenol and ibuprofen, and gentle stretching of the back to ensure that symptoms did not recur once you are discharged.  If you experience worsening of the low back pain, blood in the urine, numbness or tingling in the legs, or weakness in the legs, you should plan to return back to emergency department for further evaluation.  I have sent a prescription for Flexeril to your pharmacy.  This medication is sedating so you should plan on taking at night initially to determine if it causes excessive sedation for you.  If you do find that it causes significant sedation, he should plan on only taking this medication at night as it will be unsafe for you to drive or operate any heavy machinery while on this medication.

## 2022-11-19 NOTE — ED Provider Notes (Signed)
 [facility] EMERGENCY DEPARTMENT AT [facility] Provider Note   CSN: [id number] Arrival date & time: 11/19/22  1145     History Chief Complaint  Patient presents with   Flank Pain   Back Pain    Vanessa Case is a 52 y.o. female.  Patient presents emergency room complaining of back and flank pain.  She reports that this pain has been present for about the last week.  She reports that the flank pain started and has been radiating towards the front of her abdomen on both sides.  She does not recall any specific injury that would have caused the symptoms.  Denies any hematuria, dysuria, nausea, vomiting, chest pain, shortness of breath.  Reports that it feels uncomfortable to lay flat on her back this typically will trigger her symptoms.  Patient denies any history of prior kidney stones.   Flank Pain Pertinent negatives include no headaches and no shortness of breath.  Back Pain Associated symptoms: no dysuria, no fever, no headaches and no weakness        Home Medications Prior to Admission medications   Medication Sig Start Date End Date Taking? Authorizing Provider  cyclobenzaprine (FLEXERIL) 10 MG tablet Take 1 tablet (10 mg total) by mouth 2 (two) times daily as needed for muscle spasms. 11/19/22  Yes Smitty Knudsen, PA-C  acetaminophen (TYLENOL) 500 MG tablet Take 1,000 mg by mouth daily as needed for mild pain.    [provider]  aspirin EC 81 MG EC tablet Take 1 tablet (81 mg total) by mouth daily. 02/09/20   Arty Baumgartner, NP  atorvastatin (LIPITOR) 80 MG tablet Take 1 tablet (80 mg total) by mouth daily. 02/09/20   Arty Baumgartner, NP  benzonatate (TESSALON) 100 MG capsule Take 1 capsule (100 mg total) by mouth every 8 (eight) hours. Patient not taking: Reported on 11/08/2021 12/31/20   Tanda Rockers, PA-C  BRILINTA 90 MG TABS tablet Take 90 mg by mouth 2 (two) times daily. 07/06/21   [provider]  dicyclomine (BENTYL) 10 MG  capsule Take 1 capsule (10 mg total) by mouth 4 (four) times daily -  before meals and at bedtime. 01/15/18   Meryl Dare, MD  DILT-XR 180 MG 24 hr capsule Take 180 mg by mouth daily. 01/21/22   [provider]  diltiazem (CARDIZEM CD) 180 MG 24 hr capsule Take 1 capsule (180 mg total) by mouth daily. 02/10/21 11/08/21  Henderly, Britni A, PA-C  FLUoxetine (PROZAC) 40 MG capsule Take 40 mg by mouth daily. 12/22/19   [provider]  fluticasone (FLOVENT HFA) 44 MCG/ACT inhaler Inhale 2 puffs into the lungs 2 (two) times daily as needed (for shortness of breath). 03/14/16   Benjiman Core, MD  gabapentin (NEURONTIN) 300 MG capsule Take 300 mg by mouth 2 (two) times daily. 12/22/19   [provider]  hydrOXYzine (ATARAX/VISTARIL) 25 MG tablet Take 25 mg by mouth daily. 07/19/20   [provider]  isosorbide mononitrate (IMDUR) 30 MG 24 hr tablet Take 1 tablet (30 mg total) by mouth daily. Patient not taking: Reported on 11/08/2021 07/30/20   Alvira Monday, MD  metFORMIN (GLUCOPHAGE) 1000 MG tablet Take 1,000 mg by mouth 2 (two) times daily with a meal.    [provider]  Multiple Vitamins-Calcium (ONE-A-DAY WOMENS FORMULA PO) Take 1 tablet by mouth daily.    [provider]  nicotine polacrilex (NICORETTE) 4 MG gum Take 1 each (4 mg total)  by mouth as needed for smoking cessation. Chew 1 piece every 2 hours as needed; no more than 24 pieces a day Patient not taking: Reported on 07/30/2020 02/18/20   Corrin Parker, PA-C  nitroGLYCERIN (NITROSTAT) 0.4 MG SL tablet Place 1 tablet (0.4 mg total) under the tongue every 5 (five) minutes x 3 doses as needed for chest pain. 02/08/20   Arty Baumgartner, NP  ondansetron (ZOFRAN ODT) 4 MG disintegrating tablet Take 1 tablet (4 mg total) by mouth every 8 (eight) hours as needed for nausea or vomiting. Patient not taking: Reported on 11/08/2021 12/31/20   Tanda Rockers, PA-C  ondansetron (ZOFRAN) 4 MG  tablet Take 1 tablet (4 mg total) by mouth as needed for nausea or vomiting. Patient not taking: Reported on 11/08/2021 09/01/21   Al Decant, PA-C  oxyCODONE (ROXICODONE) 5 MG immediate release tablet Take 1 tablet (5 mg total) by mouth every 6 (six) hours as needed for up to 10 doses for breakthrough pain. 07/25/22   Curatolo, Adam, DO  oxyCODONE-acetaminophen (PERCOCET/ROXICET) 5-325 MG tablet Take 1 tablet by mouth every 6 (six) hours as needed for severe pain. 08/09/22   Nanavati, Ankit, MD  OZEMPIC, 0.25 OR 0.5 MG/DOSE, 2 MG/1.5ML SOPN Inject 0.5 mg into the skin once a week. 11/03/21   [provider]  prazosin (MINIPRESS) 1 MG capsule Take 1 capsule (1 mg total) by mouth at bedtime. To prevent nightmares 07/25/15   Withrow, Everardo All, FNP  QUEtiapine (SEROQUEL) 50 MG tablet Take 50 mg by mouth at bedtime.    [provider]  traZODone (DESYREL) 50 MG tablet Take 50 mg by mouth at bedtime. 01/16/20   [provider]      Allergies    Percocet [oxycodone-acetaminophen] and Ambien [zolpidem]    Review of Systems   Review of Systems  Constitutional:  Negative for chills and fever.  Respiratory:  Negative for shortness of breath.   Genitourinary:  Positive for flank pain. Negative for difficulty urinating and dysuria.  Musculoskeletal:  Positive for back pain. Negative for joint swelling, neck pain and neck stiffness.  Neurological:  Negative for weakness and headaches.  All other systems reviewed and are negative.   Physical Exam Updated Vital Signs BP 129/79   Pulse 86   Temp 98.1 F (36.7 C) (Oral)   Resp 14   Ht 5\' 5"  (1.651 m)   Wt 78.9 kg   LMP 10/07/2011   SpO2 100%   BMI 28.96 kg/m  Physical Exam Vitals and nursing note reviewed.  Constitutional:      Appearance: Normal appearance.  HENT:     Head: Normocephalic and atraumatic.  Eyes:     Conjunctiva/sclera: Conjunctivae normal.  Cardiovascular:     Rate and Rhythm: Normal rate and  regular rhythm.  Pulmonary:     Effort: Pulmonary effort is normal. No respiratory distress.     Breath sounds: Normal breath sounds.  Abdominal:     General: Abdomen is flat.     Comments: No tenderness on palpation in any quadrant  Musculoskeletal:        General: No tenderness, deformity or signs of injury. Normal range of motion.     Comments: Negative straight leg raise.  Significant tenderness to palpation on the paraspinal muscles from mid thoracic down to lumbar spine.  Skin:    General: Skin is warm and dry.  Neurological:     Mental Status: She is alert.     ED Results /  Procedures / Treatments   Labs (all labs ordered are listed, but only abnormal results are displayed) Labs Reviewed  URINALYSIS, ROUTINE W REFLEX MICROSCOPIC - Abnormal; Notable for the following components:      Result Value   APPearance HAZY (*)    Ketones, ur 5 (*)    Bacteria, UA FEW (*)    All other components within normal limits  COMPREHENSIVE METABOLIC PANEL - Abnormal; Notable for the following components:   Glucose, Bld 185 (*)    Total Protein 8.3 (*)    All other components within normal limits  CBC WITH DIFFERENTIAL/PLATELET  LIPASE, BLOOD    EKG None  Radiology CT Renal Stone Study  Result Date: 11/19/2022 CLINICAL DATA:  Flank pain. EXAM: CT ABDOMEN AND PELVIS WITHOUT CONTRAST TECHNIQUE: Multidetector CT imaging of the abdomen and pelvis was performed following the standard protocol without IV contrast. RADIATION DOSE REDUCTION: This exam was performed according to the departmental dose-optimization program which includes automated exposure control, adjustment of the mA and/or kV according to patient size and/or use of iterative reconstruction technique. COMPARISON:  February 05, 2020. FINDINGS: Lower chest: No acute abnormality. Hepatobiliary: No focal liver abnormality is seen. Status post cholecystectomy. No biliary dilatation. Pancreas: Unremarkable. No pancreatic ductal dilatation  or surrounding inflammatory changes. Spleen: Normal in size without focal abnormality. Adrenals/Urinary Tract: Adrenal glands are unremarkable. Small nonobstructive left renal calculus. No hydronephrosis or renal obstruction is noted. Bladder is unremarkable. Stomach/Bowel: Stomach is within normal limits. Appendix appears normal. No evidence of bowel wall thickening, distention, or inflammatory changes. Vascular/Lymphatic: Aortic atherosclerosis. No enlarged abdominal or pelvic lymph nodes. Reproductive: Status post hysterectomy. No adnexal masses. Other: No abdominal wall hernia or abnormality. No abdominopelvic ascites. Musculoskeletal: No acute or significant osseous findings. IMPRESSION: Small nonobstructive left renal calculus. No hydronephrosis or obstruction is noted. No acute abnormality seen in abdomen or pelvis. Aortic Atherosclerosis (ICD10-I70.0). Electronically Signed   By: Lupita Raider M.D.   On: 11/19/2022 12:45    Procedures Procedures   Medications Ordered in ED Medications  ketorolac (TORADOL) 30 MG/ML injection 60 mg (60 mg Intramuscular Given 11/19/22 1516)  fentaNYL (SUBLIMAZE) injection 50 mcg (50 mcg Intramuscular Given 11/19/22 1516)  acetaminophen (TYLENOL) tablet 650 mg (650 mg Oral Given 11/19/22 1519)  diazepam (VALIUM) tablet 2 mg (2 mg Oral Given 11/19/22 1519)    ED Course/ Medical Decision Making/ A&P                           Medical Decision Making Amount and/or Complexity of Data Reviewed Labs: ordered. Radiology: ordered.  Risk OTC drugs. Prescription drug management.   This patient presents to the ED for concern of flank pain and low back pain.  Differential diagnosis includes pyelonephritis, UTI, constipation   Lab Tests:  I Ordered, and personally interpreted labs.  The pertinent results include: Normal CBC, CMP, lipase, urinalysis   Imaging Studies ordered:  I ordered imaging studies including CT renal stone study I agree with the  radiologist interpretation, which showed a small nonobstructing left renal stone   Medicines ordered and prescription drug management:  I ordered medication including fentanyl, Toradol, diazepam, Tylenol for pain Reevaluation of the patient after these medicines showed that the patient significantly improved I have reviewed the patients home medicines and have made adjustments as needed   Problem List / ED Course:  Patient presented emerged part with complaints of flank pain and back pain.  Symptoms have  been present for about 1 week prior to arrival to the emergency department.  Given patient's nonspecific complaints, in conjunction with persistent flank pain, CT renal stone study was ordered to rule out any abdominal pathology.  No specific cause for symptoms was seen on CT renal.  Combination of several medications were given to address patient's pain or symptoms significantly.  Advised patient that she should follow-up with her primary care provider in the outpatient setting for further evaluation if symptoms or not improving.  Prescription for Flexeril sent to patient's pharmacy.  Discussed all return precautions with patient in which she verbalized understanding.  Patient was agreeable with plan to discharge home and follow-up outpatient if needed.  All questions answered prior to discharge.  Final Clinical Impression(s) / ED Diagnoses Final diagnoses:  Acute bilateral low back pain without sciatica  Flank pain    Rx / DC Orders ED Discharge Orders          Ordered    cyclobenzaprine (FLEXERIL) 10 MG tablet  2 times daily PRN        11/19/22 1659              Smitty Knudsen, PA-C 11/19/22 1712    Melene Plan, DO 11/20/22 1413

## 2022-11-19 NOTE — ED Provider Triage Note (Signed)
Emergency Medicine Provider Triage Evaluation Note  Vanessa Case , a 52 y.o. female  was evaluated in triage.  Pt complains of back and flank pain.  Patient reports that symptoms began about 3 days ago been progressively worsening.  She reports that she has noticed her urine has become more cloudy and discolored.  Patient reports that is difficult to find any comfortable positions to sit.  Denies any fevers, chest pain, shortness of breath, nausea, vomiting, diarrhea.  Review of Systems  Positive: As above Negative: As above  Physical Exam  BP (!) 138/95 (BP Location: Left Arm)   Pulse (!) 123   Temp 99.3 F (37.4 C)   Resp 16   Ht 5\' 5"  (1.651 m)   Wt 78.9 kg   LMP 10/07/2011   SpO2 99%   BMI 28.96 kg/m  Gen:   Awake, no distress   Resp:  Normal effort  MSK:   Moves extremities without difficulty  Other:  Significant bilateral CVA tenderness, right worse than left  Medical Decision Making  Medically screening exam initiated at 11:54 AM.  Appropriate orders placed.  Vanessa Case was informed that the remainder of the evaluation will be completed by another provider, this initial triage assessment does not replace that evaluation, and the importance of remaining in the ED until their evaluation is complete.     Vanessa Heller, PA-C 11/19/22 1157

## 2022-11-19 NOTE — ED Triage Notes (Signed)
Pt BIB [facility] from home c/o back pain that is tender to touch that wraps around her left side to her flank area that started about a week ago but has gradually gotten worse over the last 3 days.

## 2022-11-19 NOTE — ED Notes (Signed)
Patient Alert and oriented to baseline. Stable and ambulatory to baseline. Patient verbalized understanding of the discharge instructions.  Patient belongings were taken by the patient.   

## 2022-11-22 ENCOUNTER — Encounter (HOSPITAL_COMMUNITY): Payer: Self-pay

## 2022-11-22 ENCOUNTER — Emergency Department (HOSPITAL_COMMUNITY)
Admission: EM | Admit: 2022-11-22 | Discharge: 2022-11-22 | Disposition: A | Discharge status: Home / Self Care | Payer: Medicaid Other | Attending: Emergency Medicine | Admitting: Emergency Medicine

## 2022-11-22 ENCOUNTER — Other Ambulatory Visit: Payer: Self-pay

## 2022-11-22 DIAGNOSIS — M549 Dorsalgia, unspecified: Secondary | ICD-10-CM | POA: Insufficient documentation

## 2022-11-22 DIAGNOSIS — R000 Tachycardia, unspecified: Secondary | ICD-10-CM | POA: Insufficient documentation

## 2022-11-22 DIAGNOSIS — B029 Zoster without complications: Secondary | ICD-10-CM | POA: Insufficient documentation

## 2022-11-22 DIAGNOSIS — Z7982 Long term (current) use of aspirin: Secondary | ICD-10-CM | POA: Diagnosis not present

## 2022-11-22 DIAGNOSIS — R21 Rash and other nonspecific skin eruption: Secondary | ICD-10-CM | POA: Diagnosis present

## 2022-11-22 HISTORY — DX: Atherosclerotic heart disease of native coronary artery without angina pectoris: I25.10

## 2022-11-22 MED ORDER — KETOROLAC TROMETHAMINE 15 MG/ML IJ SOLN
15.0000 mg | Freq: Once | INTRAMUSCULAR | Status: AC
  Administered 2022-11-22: 15 mg via INTRAMUSCULAR
  Filled 2022-11-22: qty 1

## 2022-11-22 MED ORDER — VALACYCLOVIR HCL 500 MG PO TABS
1000.0000 mg | ORAL_TABLET | Freq: Once | ORAL | Status: AC
  Administered 2022-11-22: 1000 mg via ORAL
  Filled 2022-11-22 (×2): qty 2

## 2022-11-22 MED ORDER — FENTANYL CITRATE PF 50 MCG/ML IJ SOSY
50.0000 ug | PREFILLED_SYRINGE | Freq: Once | INTRAMUSCULAR | Status: AC
  Administered 2022-11-22: 50 ug via INTRAVENOUS
  Filled 2022-11-22: qty 1

## 2022-11-22 MED ORDER — LIDOCAINE 5 % EX PTCH
1.0000 | MEDICATED_PATCH | CUTANEOUS | Status: DC
  Administered 2022-11-22: 1 via TRANSDERMAL
  Filled 2022-11-22: qty 1

## 2022-11-22 MED ORDER — VALACYCLOVIR HCL 1 G PO TABS: 1000.0000 mg | ORAL_TABLET | Freq: Three times a day (TID) | ORAL | 0 refills | Status: AC

## 2022-11-22 MED ORDER — PREDNISONE 20 MG PO TABS: 60.0000 mg | ORAL_TABLET | Freq: Every day | ORAL | 0 refills | Status: AC

## 2022-11-22 MED ORDER — LIDOCAINE 5 % EX PTCH: 1.0000 | MEDICATED_PATCH | CUTANEOUS | 0 refills | Status: DC

## 2022-11-22 MED ORDER — HYDROCODONE-ACETAMINOPHEN 5-325 MG PO TABS: 1.0000 | ORAL_TABLET | Freq: Four times a day (QID) | ORAL | 0 refills | Status: DC | PRN

## 2022-11-22 NOTE — Discharge Instructions (Signed)
Your exam today is consistent with shingles.  You received a few medications in the emergency department with improvement in your pain.  I have sent valacyclovir, prednisone, lidocaine patch, and Norco to the pharmacy for you.  Norco is a narcotic pain medication and can make you drowsy.  Do not drive after taking this medication.  For any concerning symptoms please return to the emergency department.  Otherwise please follow-up with your primary care provider.  If you do not have 1 appetizer information for [facility] that you can establish care with.

## 2022-11-22 NOTE — ED Notes (Signed)
Pt verbalizes understanding of discharge instructions. Opportunity for questions and answers were provided. Pt discharged from the ED.   ?

## 2022-11-22 NOTE — ED Triage Notes (Signed)
Pt BIB [facility] from home with c/o back pain. Pt was seen 2 days ago for back pain no present sciatica. Pt currently has a rash on the back unknown if it is shingles or not. Pt has hx of chicken pox. Pt also has hx of HTN.   EMS VS BP 210/110 P 133 O2 100% RA CBG 174

## 2022-11-22 NOTE — ED Provider Notes (Signed)
[facility] Provider Note   CSN: [id number] Arrival date & time: 11/22/22  1938     History  Chief Complaint  Patient presents with   Back Pain    Vanessa Case is a 52 y.o. female.  52 year old female presents today for evaluation of ongoing back pain.  She was recently evaluated in the emergency department and underwent thorough workup.  However she states today she also developed rash.  Denies fever or other complaints.  Does appear uncomfortable during exam.  Denies fever.  She is shivering however states she is shivering from the level of pain.  She is tachycardic.  The history is provided by the patient. No language interpreter was used.       Home Medications Prior to Admission medications   Medication Sig Start Date End Date Taking? Authorizing Provider  acetaminophen (TYLENOL) 500 MG tablet Take 1,000 mg by mouth daily as needed for mild pain.    [provider]  aspirin EC 81 MG EC tablet Take 1 tablet (81 mg total) by mouth daily. 02/09/20   Cheryln Manly, NP  atorvastatin (LIPITOR) 80 MG tablet Take 1 tablet (80 mg total) by mouth daily. 02/09/20   Cheryln Manly, NP  benzonatate (TESSALON) 100 MG capsule Take 1 capsule (100 mg total) by mouth every 8 (eight) hours. Patient not taking: Reported on 11/08/2021 12/31/20   Eustaquio Maize, PA-C  BRILINTA 90 MG TABS tablet Take 90 mg by mouth 2 (two) times daily. 07/06/21   [provider]  cyclobenzaprine (FLEXERIL) 10 MG tablet Take 1 tablet (10 mg total) by mouth 2 (two) times daily as needed for muscle spasms. 11/19/22   Luvenia Heller, PA-C  dicyclomine (BENTYL) 10 MG capsule Take 1 capsule (10 mg total) by mouth 4 (four) times daily -  before meals and at bedtime. 01/15/18   Ladene Artist, MD  DILT-XR 180 MG 24 hr capsule Take 180 mg by mouth daily. 01/21/22   [provider]  diltiazem (CARDIZEM CD) 180 MG 24 hr capsule Take 1 capsule  (180 mg total) by mouth daily. 02/10/21 11/08/21  Henderly, Britni A, PA-C  FLUoxetine (PROZAC) 40 MG capsule Take 40 mg by mouth daily. 12/22/19   [provider]  fluticasone (FLOVENT HFA) 44 MCG/ACT inhaler Inhale 2 puffs into the lungs 2 (two) times daily as needed (for shortness of breath). 03/14/16   Davonna Belling, MD  gabapentin (NEURONTIN) 300 MG capsule Take 300 mg by mouth 2 (two) times daily. 12/22/19   [provider]  hydrOXYzine (ATARAX/VISTARIL) 25 MG tablet Take 25 mg by mouth daily. 07/19/20   [provider]  isosorbide mononitrate (IMDUR) 30 MG 24 hr tablet Take 1 tablet (30 mg total) by mouth daily. Patient not taking: Reported on 11/08/2021 07/30/20   Gareth Morgan, MD  metFORMIN (GLUCOPHAGE) 1000 MG tablet Take 1,000 mg by mouth 2 (two) times daily with a meal.    [provider]  Multiple Vitamins-Calcium (ONE-A-DAY WOMENS FORMULA PO) Take 1 tablet by mouth daily.    [provider]  nicotine polacrilex (NICORETTE) 4 MG gum Take 1 each (4 mg total) by mouth as needed for smoking cessation. Chew 1 piece every 2 hours as needed; no more than 24 pieces a day Patient not taking: Reported on 07/30/2020 02/18/20   Darreld Mclean, PA-C  nitroGLYCERIN (NITROSTAT) 0.4 MG SL tablet Place 1 tablet (0.4 mg total) under the tongue every  5 (five) minutes x 3 doses as needed for chest pain. 02/08/20   Cheryln Manly, NP  ondansetron (ZOFRAN ODT) 4 MG disintegrating tablet Take 1 tablet (4 mg total) by mouth every 8 (eight) hours as needed for nausea or vomiting. Patient not taking: Reported on 11/08/2021 12/31/20   Eustaquio Maize, PA-C  ondansetron (ZOFRAN) 4 MG tablet Take 1 tablet (4 mg total) by mouth as needed for nausea or vomiting. Patient not taking: Reported on 11/08/2021 09/01/21   Azucena Cecil, PA-C  oxyCODONE (ROXICODONE) 5 MG immediate release tablet Take 1 tablet (5 mg total) by mouth every 6 (six) hours as needed for up to  10 doses for breakthrough pain. 07/25/22   Curatolo, Adam, DO  oxyCODONE-acetaminophen (PERCOCET/ROXICET) 5-325 MG tablet Take 1 tablet by mouth every 6 (six) hours as needed for severe pain. 08/09/22   Nanavati, Ankit, MD  OZEMPIC, 0.25 OR 0.5 MG/DOSE, 2 MG/1.5ML SOPN Inject 0.5 mg into the skin once a week. 11/03/21   [provider]  prazosin (MINIPRESS) 1 MG capsule Take 1 capsule (1 mg total) by mouth at bedtime. To prevent nightmares 07/25/15   Withrow, Elyse Jarvis, FNP  QUEtiapine (SEROQUEL) 50 MG tablet Take 50 mg by mouth at bedtime.    [provider]  traZODone (DESYREL) 50 MG tablet Take 50 mg by mouth at bedtime. 01/16/20   [provider]      Allergies    Percocet [oxycodone-acetaminophen] and Ambien [zolpidem]    Review of Systems   Review of Systems  Constitutional:  Negative for chills and fever.  Respiratory:  Negative for shortness of breath.   Cardiovascular:  Negative for chest pain.  Gastrointestinal:  Negative for abdominal pain and vomiting.  Genitourinary:  Negative for dysuria.  Musculoskeletal:  Positive for back pain.  Skin:  Positive for rash.  Neurological:  Negative for light-headedness.  All other systems reviewed and are negative.   Physical Exam Updated Vital Signs BP (!) 158/97   Pulse (!) 105   Temp 98.1 F (36.7 C) (Oral)   Resp 17   Ht 5\' 5"  (1.651 m)   Wt 77.1 kg   LMP 10/07/2011   SpO2 100%   BMI 28.29 kg/m  Physical Exam Vitals and nursing note reviewed.  Constitutional:      General: She is not in acute distress.    Appearance: Normal appearance. She is not ill-appearing.  HENT:     Head: Normocephalic and atraumatic.     Nose: Nose normal.  Eyes:     General: No scleral icterus.    Extraocular Movements: Extraocular movements intact.     Conjunctiva/sclera: Conjunctivae normal.  Cardiovascular:     Rate and Rhythm: Regular rhythm. Tachycardia present.     Pulses: Normal pulses.  Pulmonary:     Effort:  Pulmonary effort is normal. No respiratory distress.     Breath sounds: Normal breath sounds. No wheezing or rales.  Abdominal:     General: There is no distension.     Tenderness: There is no abdominal tenderness.  Musculoskeletal:        General: Normal range of motion.     Cervical back: Normal range of motion.     Comments: Full range of motion bilateral upper and lower extremities.  Cervical, thoracic, lumbar spine without tenderness to palpation.  Rash noted starting at midline and wrapping around the left side.  Follows the L1-L2 dermatomal pattern.  Consistent with herpes zoster.  2+ bilateral DP  pulses.  Skin:    General: Skin is warm and dry.  Neurological:     General: No focal deficit present.     Mental Status: She is alert. Mental status is at baseline.     ED Results / Procedures / Treatments   Labs (all labs ordered are listed, but only abnormal results are displayed) Labs Reviewed - No data to display  EKG None  Radiology No results found.  Procedures Procedures    Medications Ordered in ED Medications  lidocaine (LIDODERM) 5 % 1 patch (1 patch Transdermal Patch Applied 11/22/22 2056)  valACYclovir (VALTREX) tablet 1,000 mg (1,000 mg Oral Given 11/22/22 2053)  fentaNYL (SUBLIMAZE) injection 50 mcg (50 mcg Intravenous Given 11/22/22 2052)  ketorolac (TORADOL) 15 MG/ML injection 15 mg (15 mg Intramuscular Given 11/22/22 2051)    ED Course/ Medical Decision Making/ A&P                             Medical Decision Making Risk Prescription drug management.   52 year old female presents today for evaluation of ongoing back pain.  She was recently evaluated in this emergency department and underwent thorough workup.  She obtained blood work, CT renal stone study all of which did not show any acute findings.  However she developed rash today.  On exam it is consistent with herpes zoster.  She was given multimodal pain regimen with dose of Norco, Valtrex, lidocaine  patch, and Toradol.  On reevaluation she reports significant improvement.  Heart rate improved from 120s to 105.  No suspicion for cauda equina or spinal epidural abscess.  Without red flag signs or symptoms.  She is appropriate for discharge.  Discharged in stable condition.  Patient does voiced understanding and is in agreement with plan.   Final Clinical Impression(s) / ED Diagnoses Final diagnoses:  Herpes zoster without complication    Rx / DC Orders ED Discharge Orders          Ordered    valACYclovir (VALTREX) 1000 MG tablet  3 times daily        11/22/22 2318    lidocaine (LIDODERM) 5 %  Every 24 hours        11/22/22 2318    predniSONE (DELTASONE) 20 MG tablet  Daily with breakfast        11/22/22 2318    HYDROcodone-acetaminophen (NORCO/VICODIN) 5-325 MG tablet  Every 6 hours PRN        11/22/22 2318              Evlyn Courier, PA-C 11/22/22 2331    Tretha Sciara, MD 11/23/22 1443

## 2022-12-23 ENCOUNTER — Telehealth: Payer: Self-pay

## 2022-12-23 ENCOUNTER — Ambulatory Visit: Payer: Medicaid Other | Admitting: Student

## 2022-12-23 ENCOUNTER — Encounter: Payer: Self-pay | Admitting: Student

## 2022-12-23 VITALS — BP 134/72 | HR 100 | Temp 98.7°F | Wt 172.4 lb

## 2022-12-23 DIAGNOSIS — B0229 Other postherpetic nervous system involvement: Secondary | ICD-10-CM | POA: Diagnosis not present

## 2022-12-23 DIAGNOSIS — Z7985 Long-term (current) use of injectable non-insulin antidiabetic drugs: Secondary | ICD-10-CM | POA: Diagnosis not present

## 2022-12-23 DIAGNOSIS — F3181 Bipolar II disorder: Secondary | ICD-10-CM | POA: Diagnosis not present

## 2022-12-23 DIAGNOSIS — Z7984 Long term (current) use of oral hypoglycemic drugs: Secondary | ICD-10-CM | POA: Diagnosis not present

## 2022-12-23 DIAGNOSIS — E1142 Type 2 diabetes mellitus with diabetic polyneuropathy: Secondary | ICD-10-CM

## 2022-12-23 DIAGNOSIS — F332 Major depressive disorder, recurrent severe without psychotic features: Secondary | ICD-10-CM

## 2022-12-23 LAB — GLUCOSE, CAPILLARY: Glucose-Capillary: 263 mg/dL — ABNORMAL HIGH (ref 70–99)

## 2022-12-23 LAB — POCT GLYCOSYLATED HEMOGLOBIN (HGB A1C): Hemoglobin A1C: 11.7 % — AB (ref 4.0–5.6)

## 2022-12-23 MED ORDER — HYDROXYZINE HCL 25 MG PO TABS: 25.0000 mg | ORAL_TABLET | Freq: Every day | ORAL | 0 refills | Status: DC

## 2022-12-23 MED ORDER — EMPAGLIFLOZIN 10 MG PO TABS: 10.0000 mg | ORAL_TABLET | Freq: Every day | ORAL | 3 refills | Status: DC

## 2022-12-23 MED ORDER — GABAPENTIN 300 MG PO CAPS: ORAL_CAPSULE | ORAL | 3 refills | Status: DC

## 2022-12-23 MED ORDER — METFORMIN HCL 1000 MG PO TABS: 1000.0000 mg | ORAL_TABLET | Freq: Two times a day (BID) | ORAL | 3 refills | Status: DC

## 2022-12-23 MED ORDER — FLUOXETINE HCL 40 MG PO CAPS: 40.0000 mg | ORAL_CAPSULE | Freq: Every day | ORAL | 0 refills | Status: DC

## 2022-12-23 MED ORDER — QUETIAPINE FUMARATE 50 MG PO TABS: 50.0000 mg | ORAL_TABLET | Freq: Every day | ORAL | 0 refills | Status: DC

## 2022-12-23 MED ORDER — TRAZODONE HCL 50 MG PO TABS: 50.0000 mg | ORAL_TABLET | Freq: Every day | ORAL | 0 refills | Status: DC

## 2022-12-23 MED ORDER — SEMAGLUTIDE(0.25 OR 0.5MG/DOS) 2 MG/3ML ~~LOC~~ SOPN: 1.0000 mg | PEN_INJECTOR | SUBCUTANEOUS | 3 refills | Status: DC

## 2022-12-23 MED ORDER — PRAZOSIN HCL 1 MG PO CAPS: 1.0000 mg | ORAL_CAPSULE | Freq: Every day | ORAL | 0 refills | Status: DC

## 2022-12-23 NOTE — Telephone Encounter (Signed)
Prior Authorization for patient came through on cover my meds was submitted with last office notes awaiting approval or denial

## 2022-12-23 NOTE — Telephone Encounter (Signed)
Decision:Approved Joycelyn Das (Key: [id number]) PA Case ID #: [id number] Need Help? Call us at [phone] Outcome Approved today Approved. This drug has been approved. Approved quantity: 30 tablets per 30 day(s). You may fill up to a 34 day supply at a retail pharmacy. You may fill up to a 90 day supply for maintenance drugs, please refer to the formulary for details. Please call the pharmacy to process your prescription claim. Authorization Expiration Date: 12/23/2023 Drug QUEtiapine Fumarate '50MG'$  tablets ePA cloud logo Form [facility] Medicaid of [place] Prior Authorization Request Form [phone] NCPDP)

## 2022-12-23 NOTE — Patient Instructions (Addendum)
Thank you so much for coming to the clinic today!   For your depression/PTSD/BPD: I am supplying you with a one month refill of all your meds, as well as placing a referral to a psychiatrist who will then take over those medications  For your diabetes: We are going to increase your ozempic to '1mg'$  weekly, and continue the metformin. I am also sending in a medication called Jardiance. Remember the side effects we talked about like yeast infections, and if you do have them please stop taking the medications  For your Herpes - We are increasing your gabapentin, please take one pill in the AM, one pill in the afternoon, and two pills at night. I believe this will help with your pain greatly, as well as your neuropathy.   If you have any questions please feel free to the call the clinic at anytime at [phone]. It was a pleasure seeing you!  Best, Dr. Sanjuana Mae

## 2022-12-24 DIAGNOSIS — B0229 Other postherpetic nervous system involvement: Secondary | ICD-10-CM | POA: Insufficient documentation

## 2022-12-24 LAB — MICROALBUMIN / CREATININE URINE RATIO
Creatinine, Urine: 146.1 mg/dL
Microalb/Creat Ratio: 7 mg/g creat (ref 0–29)
Microalbumin, Urine: 10.2 ug/mL

## 2022-12-24 NOTE — Assessment & Plan Note (Signed)
Patient initially presented to the emergency department on February 2, was found to have herpes zoster infection which was treated with Valtrex.  Most of the rash is gone away, however the pain continues to persist.  It is located in the L1/L2 dermatome just on the left side.  Multiple vesicles are seen spread along that dermatome as well.  I believe this is most consistent with postherpetic neuralgia.  She was already taking gabapentin for her diabetic neuropathy, instructed patient to take gabapentin 300 - 300 - 600.  Will reevaluate use of gabapentin at next visit and if pain is persisting can increase if patient can tolerate.

## 2022-12-24 NOTE — Assessment & Plan Note (Signed)
Patient with long history of type 2 diabetes mellitus.  She is currently taking metformin at 1000 mg twice daily, and Ozempic 1 mg weekly.  A1c currently at 11.7 presents, increased from around 9.2 two years ago.  She has been out of these medications for quite some time now.  Plan: - Will start metformin 1000 mg twice daily - Will continue Ozempic 1 mg weekly - Will obtain urine microalbumin/creatinine - Will also start patient on Jardiance 10 mg

## 2022-12-24 NOTE — Assessment & Plan Note (Signed)
Patient's PHQ-9 today currently at 19.  She has severe history with psychiatric illness such as major depressive disorder, bipolar 2 disorder and night terrors.  She used to follow with [facility] for her psychiatric illnesses however due to short staffing they could not see her per patient.  Her previous regimen was Prozac, Atarax, Seroquel, trazodone, prazosin.  She has been out of these medications for a couple months now.  She currently denies any suicidal or homicidal ideations.  Plan: - Will restart Prozac, Atarax, Seroquel, trazodone, prazosin with 1 month supply until patient can see psychiatrist - Psychiatry referral placed

## 2022-12-24 NOTE — Progress Notes (Signed)
CC: Establishing care  HPI:  [name] is a 52 y.o. female living with a history stated below and presents today for establishing care.  [name] is quite an extensive psychiatric disorder history, and was previously being followed up by Vanessa Case for both her medical and psychiatric needs, but would like to establish care with [place].  She has been without her medications for quite some time now.  Please see problem based assessment and plan for additional details.  Past Medical History:  Diagnosis Date   Anemia    Anxiety    Arthritis    Cholelithiasis with acute cholecystitis 06/28/2013   Chronic abdominal pain    Chronic back pain    Chronic headache    Chronic neck pain    Coronary artery disease    Depression    Diabetes mellitus    adult onset dm-maintained on glipizide and metformin, pt states fasting glucose runs 110   Fibromyalgia    GERD (gastroesophageal reflux disease)    HLD (hyperlipidemia) 05/30/2015   Hyperlipidemia    Hypertension    maintained on lisinopril hct, metoprolol-134/86 at pat visit   Neuromuscular disorder ([place])    Neuropathy    NSTEMI (non-ST elevated myocardial infarction) ([place]) 02/05/2020   Obesity    PTSD (post-traumatic stress disorder)    Right arm weakness 03/05/2013   Substance abuse ([place])     Current Outpatient Medications on File Prior to Visit  Medication Sig Dispense Refill   aspirin EC 81 MG EC tablet Take 1 tablet (81 mg total) by mouth daily. 90 tablet 2   atorvastatin (LIPITOR) 80 MG tablet Take 1 tablet (80 mg total) by mouth daily. 90 tablet 1   benzonatate (TESSALON) 100 MG capsule Take 1 capsule (100 mg total) by mouth every 8 (eight) hours. (Patient not taking: Reported on 11/08/2021) 21 capsule 0   BRILINTA 90 MG TABS tablet Take 90 mg by mouth 2 (two) times daily.     dicyclomine (BENTYL) 10 MG capsule Take 1 capsule (10 mg total) by mouth 4 (four) times daily -  before meals and at  bedtime. 120 capsule 11   diltiazem (CARDIZEM CD) 180 MG 24 hr capsule Take 1 capsule (180 mg total) by mouth daily. 30 capsule 0   fluticasone (FLOVENT HFA) 44 MCG/ACT inhaler Inhale 2 puffs into the lungs 2 (two) times daily as needed (for shortness of breath). 1 Inhaler 0   isosorbide mononitrate (IMDUR) 30 MG 24 hr tablet Take 1 tablet (30 mg total) by mouth daily. (Patient not taking: Reported on 11/08/2021) 30 tablet 0   Multiple Vitamins-Calcium (ONE-A-DAY WOMENS FORMULA PO) Take 1 tablet by mouth daily.     nicotine polacrilex (NICORETTE) 4 MG gum Take 1 each (4 mg total) by mouth as needed for smoking cessation. Chew 1 piece every 2 hours as needed; no more than 24 pieces a day (Patient not taking: Reported on 07/30/2020) 150 each 2   nitroGLYCERIN (NITROSTAT) 0.4 MG SL tablet Place 1 tablet (0.4 mg total) under the tongue every 5 (five) minutes x 3 doses as needed for chest pain. 25 tablet 2   No current facility-administered medications on file prior to visit.    Family History  Problem Relation Age of Onset   Diabetes Mother    Atrial fibrillation Mother    Cystic fibrosis Father    Diabetes Maternal Grandmother    Heart disease Maternal Grandfather     Social History  Socioeconomic History   Marital status: Divorced    Spouse name: Not on file   Number of children: 1   Years of education: Not on file   Highest education level: Not on file  Occupational History   Not on file  Tobacco Use   Smoking status: Former    Types: Cigarettes    Quit date: 01/2020    Years since quitting: 2.9   Smokeless tobacco: Never   Tobacco comments:    has smoked for 14 years total - quit for 6 years at one point but has been smoking the last 3 years  Vaping Use   Vaping Use: Never used  Substance and Sexual Activity   Alcohol use: Not Currently    Alcohol/week: 0.0 standard drinks of alcohol   Drug use: Yes    Types: Cocaine, Marijuana, "Crack" cocaine    Comment: last used  cocaine 6/19 at 1800 and marijuana 6/20 0700   Sexual activity: Not on file  Other Topics Concern   Not on file  Social History Narrative   Not on file   Social Determinants of Health   Financial Resource Strain: Not on file  Food Insecurity: Not on file  Transportation Needs: Not on file  Physical Activity: Not on file  Stress: Not on file  Social Connections: Not on file  Intimate Partner Violence: Not on file    Review of Systems: ROS negative except for what is noted on the assessment and plan.  Vitals:   12/23/22 1011  BP: 134/72  Pulse: 100  Temp: 98.7 F (37.1 C)  TempSrc: Oral  SpO2: 100%  Weight: 172 lb 6.4 oz (78.2 kg)    Physical Exam: Constitutional: Well-appearing female, in no acute distress HENT: normocephalic atraumatic, mucous membranes moist Eyes: conjunctiva non-erythematous Neck: supple Cardiovascular: regular rate and rhythm, no m/r/g Pulmonary/Chest: normal work of breathing on room air, lungs clear to auscultation bilaterally Abdominal: soft, non-tender, non-distended MSK: normal bulk and tone Neurological: alert & oriented x 3, 5/5 strength in bilateral upper and lower extremities, normal gait Skin: warm and dry Psych: anxious mood and affect  Assessment & Plan:   Postherpetic neuralgia Patient initially presented to the emergency department on February 2, was found to have herpes zoster infection which was treated with Valtrex.  Most of the rash is gone away, however the pain continues to persist.  It is located in the L1/L2 dermatome just on the left side.  Multiple vesicles are seen spread along that dermatome as well.  I believe this is most consistent with postherpetic neuralgia.  She was already taking gabapentin for her diabetic neuropathy, instructed patient to take gabapentin 300 - 300 - 600.  Will reevaluate use of gabapentin at next visit and if pain is persisting can increase if patient can tolerate.  Major depressive disorder,  recurrent, severe without psychotic features ([place]) Patient's PHQ-9 today currently at 47.  She has severe history with psychiatric illness such as major depressive disorder, bipolar 2 disorder and night terrors.  She used to follow with [facility] for her psychiatric illnesses however due to short staffing they could not see her per patient.  Her previous regimen was Prozac, Atarax, Seroquel, trazodone, prazosin.  She has been out of these medications for a couple months now.  She currently denies any suicidal or homicidal ideations.  Plan: - Will restart Prozac, Atarax, Seroquel, trazodone, prazosin with 1 month supply until patient can see psychiatrist - Psychiatry referral placed  Type 2 diabetes  mellitus with diabetic polyneuropathy ([place]) Patient with long history of type 2 diabetes mellitus.  She is currently taking metformin at 1000 mg twice daily, and Ozempic 1 mg weekly.  A1c currently at 11.7 presents, increased from around 9.2 two years ago.  She has been out of these medications for quite some time now.  Plan: - Will start metformin 1000 mg twice daily - Will continue Ozempic 1 mg weekly - Will obtain urine microalbumin/creatinine - Will also start patient on Jardiance 10 mg  Patient discussed with Dr. Thomasene Ripple, M.D. [facility], PGY-1 Pager: [phone] Date 12/24/2022 Time 1:41 PM

## 2022-12-25 NOTE — Progress Notes (Signed)
Internal Medicine Clinic Attending  Case discussed with the resident at the time of the visit.  We reviewed the resident's history and exam and pertinent patient test results.  I agree with the assessment, diagnosis, and plan of care documented in the resident's note.  

## 2023-01-21 ENCOUNTER — Other Ambulatory Visit: Payer: Self-pay

## 2023-01-21 NOTE — Telephone Encounter (Signed)
Requesting a refill of her brilinta. Appears as though cardiology was consulted during an ED visit and they recommended she change to plavix and aspirin. Unclear if this was ever done. Will need to clarify before refilling brilinta. I've also reached out to her primary cardiologist.

## 2023-01-28 ENCOUNTER — Encounter: Payer: Medicaid Other | Admitting: Internal Medicine

## 2023-03-11 ENCOUNTER — Telehealth: Payer: Self-pay

## 2023-03-11 NOTE — Telephone Encounter (Signed)
Patient called she is requesting all of her meds to be refilled  Pharmacy  [id number] - [place], [place] - [address] AT [facility] OF [address], [place] [id number]

## 2023-03-14 ENCOUNTER — Other Ambulatory Visit: Payer: Self-pay | Admitting: Student

## 2023-03-14 DIAGNOSIS — E1142 Type 2 diabetes mellitus with diabetic polyneuropathy: Secondary | ICD-10-CM

## 2023-03-14 NOTE — Telephone Encounter (Signed)
Pt states she called on 03/11/2023 and she has checked with her pharamcy and it has stillnot been called in. Pt states she needs this medication so she will be able to work.  gabapentin (NEURONTIN) 300 MG capsule   [id number] - [place], [place] - [address] AT [facility] OF [address] & [address]

## 2023-03-19 ENCOUNTER — Encounter: Payer: Medicaid Other | Admitting: Student

## 2023-03-21 NOTE — Progress Notes (Deleted)
PMH HTN, NSTEMI, T2DM,BPD,MDD Postherpetic neuralgia  Diagnosed February 2nd Gaba 300-300-600  MDD Phq 9 from 20 to ** Seen psychiatry?  T2DM with polyneuropathyHLD Metformin 1g BID Ozempic 1 mg(up to 2mg ) Jardiance 10mg (up to 25mg ) A1c 11.7 to ** Lipid panel LDL 181 01/2020  HTN imdur  HCM HCV colonoscopy Foot exam optho

## 2023-03-31 ENCOUNTER — Encounter: Payer: Self-pay | Admitting: Student

## 2023-03-31 ENCOUNTER — Other Ambulatory Visit: Payer: Self-pay

## 2023-03-31 ENCOUNTER — Ambulatory Visit (INDEPENDENT_AMBULATORY_CARE_PROVIDER_SITE_OTHER): Payer: Medicaid Other | Admitting: Student

## 2023-03-31 VITALS — BP 138/83 | HR 83 | Temp 98.3°F | Resp 32 | Ht 65.0 in | Wt 174.9 lb

## 2023-03-31 DIAGNOSIS — E1142 Type 2 diabetes mellitus with diabetic polyneuropathy: Secondary | ICD-10-CM

## 2023-03-31 DIAGNOSIS — I252 Old myocardial infarction: Secondary | ICD-10-CM | POA: Diagnosis not present

## 2023-03-31 DIAGNOSIS — Z7984 Long term (current) use of oral hypoglycemic drugs: Secondary | ICD-10-CM

## 2023-03-31 DIAGNOSIS — F332 Major depressive disorder, recurrent severe without psychotic features: Secondary | ICD-10-CM

## 2023-03-31 DIAGNOSIS — Z7985 Long-term (current) use of injectable non-insulin antidiabetic drugs: Secondary | ICD-10-CM

## 2023-03-31 DIAGNOSIS — M5416 Radiculopathy, lumbar region: Secondary | ICD-10-CM

## 2023-03-31 DIAGNOSIS — I214 Non-ST elevation (NSTEMI) myocardial infarction: Secondary | ICD-10-CM

## 2023-03-31 LAB — POCT GLYCOSYLATED HEMOGLOBIN (HGB A1C): Hemoglobin A1C: 9.1 % — AB (ref 4.0–5.6)

## 2023-03-31 LAB — GLUCOSE, CAPILLARY: Glucose-Capillary: 287 mg/dL — ABNORMAL HIGH (ref 70–99)

## 2023-03-31 MED ORDER — SEMAGLUTIDE (1 MG/DOSE) 4 MG/3ML ~~LOC~~ SOPN: 1.0000 mg | PEN_INJECTOR | SUBCUTANEOUS | 3 refills | Status: DC

## 2023-03-31 MED ORDER — HYDROXYZINE HCL 25 MG PO TABS: 25.0000 mg | ORAL_TABLET | Freq: Every day | ORAL | 0 refills | Status: DC

## 2023-03-31 MED ORDER — GABAPENTIN 300 MG PO CAPS: 600.0000 mg | ORAL_CAPSULE | Freq: Three times a day (TID) | ORAL | 3 refills | Status: DC

## 2023-03-31 NOTE — Progress Notes (Signed)
CC: Diabetes follow-up  HPI:  [name] is a 52 y.o. female living with a history stated below and presents today for diabetes follow-up. Please see problem based assessment and plan for additional details.  Past Medical History:  Diagnosis Date   Anemia    Anxiety    Arthritis    Cholelithiasis with acute cholecystitis 06/28/2013   Chronic abdominal pain    Chronic back pain    Chronic headache    Chronic neck pain    Coronary artery disease    Depression    Diabetes mellitus    adult onset dm-maintained on glipizide and metformin, pt states fasting glucose runs 110   Fibromyalgia    GERD (gastroesophageal reflux disease)    HLD (hyperlipidemia) 05/30/2015   Hyperlipidemia    Hypertension    maintained on lisinopril hct, metoprolol-134/86 at pat visit   Neuromuscular disorder (HCC)    Neuropathy    NSTEMI (non-ST elevated myocardial infarction) (HCC) 02/05/2020   Obesity    PTSD (post-traumatic stress disorder)    Right arm weakness 03/05/2013   Substance abuse (HCC)     Current Outpatient Medications on File Prior to Visit  Medication Sig Dispense Refill   aspirin EC 81 MG EC tablet Take 1 tablet (81 mg total) by mouth daily. 90 tablet 2   atorvastatin (LIPITOR) 80 MG tablet Take 1 tablet (80 mg total) by mouth daily. 90 tablet 1   diltiazem (CARDIZEM CD) 180 MG 24 hr capsule Take 1 capsule (180 mg total) by mouth daily. 30 capsule 0   FLUoxetine (PROZAC) 40 MG capsule Take 1 capsule (40 mg total) by mouth daily. 30 capsule 0   fluticasone (FLOVENT HFA) 44 MCG/ACT inhaler Inhale 2 puffs into the lungs 2 (two) times daily as needed (for shortness of breath). 1 Inhaler 0   metFORMIN (GLUCOPHAGE) 1000 MG tablet Take 1 tablet (1,000 mg total) by mouth 2 (two) times daily with a meal. 90 tablet 3   Multiple Vitamins-Calcium (ONE-A-DAY WOMENS FORMULA PO) Take 1 tablet by mouth daily.     nicotine polacrilex (NICORETTE) 4 MG gum Take 1 each (4 mg total) by  mouth as needed for smoking cessation. Chew 1 piece every 2 hours as needed; no more than 24 pieces a day (Patient not taking: Reported on 07/30/2020) 150 each 2   nitroGLYCERIN (NITROSTAT) 0.4 MG SL tablet Place 1 tablet (0.4 mg total) under the tongue every 5 (five) minutes x 3 doses as needed for chest pain. 25 tablet 2   prazosin (MINIPRESS) 1 MG capsule Take 1 capsule (1 mg total) by mouth at bedtime. To prevent nightmares 30 capsule 0   QUEtiapine (SEROQUEL) 50 MG tablet Take 1 tablet (50 mg total) by mouth at bedtime. 30 tablet 0   traZODone (DESYREL) 50 MG tablet Take 1 tablet (50 mg total) by mouth at bedtime. 30 tablet 0   No current facility-administered medications on file prior to visit.    Family History  Problem Relation Age of Onset   Diabetes Mother    Atrial fibrillation Mother    Cystic fibrosis Father    Diabetes Maternal Grandmother    Heart disease Maternal Grandfather     Social History   Socioeconomic History   Marital status: Divorced    Spouse name: Not on file   Number of children: 1   Years of education: Not on file   Highest education level: Not on file  Occupational History   Not on  file  Tobacco Use   Smoking status: Former    Types: Cigarettes    Quit date: 01/2020    Years since quitting: 3.1   Smokeless tobacco: Never   Tobacco comments:    has smoked for 14 years total - quit for 6 years at one point but has been smoking the last 3 years  Vaping Use   Vaping Use: Never used  Substance and Sexual Activity   Alcohol use: Not Currently    Alcohol/week: 0.0 standard drinks of alcohol   Drug use: Yes    Types: Cocaine, Marijuana, "Crack" cocaine    Comment: last used cocaine 6/19 at 1800 and marijuana 6/20 0700   Sexual activity: Not on file  Other Topics Concern   Not on file  Social History Narrative   Not on file   Social Determinants of Health   Financial Resource Strain: Not on file  Food Insecurity: Not on file  Transportation  Needs: Not on file  Physical Activity: Not on file  Stress: Not on file  Social Connections: Not on file  Intimate Partner Violence: Not on file    Review of Systems: ROS negative except for what is noted on the assessment and plan.  Vitals:   03/31/23 0844 03/31/23 0854 03/31/23 0940  BP: (!) 144/87 (!) 164/92 138/83  Pulse: (!) 105 (!) 113 83  Resp: (!) 32    Temp: 98.3 F (36.8 C)    TempSrc: Oral    SpO2: 100%    Weight: 174 lb 14.4 oz (79.3 kg)    Height: 5\' 5"  (1.651 m)      Physical Exam: Constitutional: well-appearing female, intermittently tearful, in no acute distress Cardiovascular: regular rate and rhythm, no m/r/g Pulmonary/Chest: normal work of breathing on room air, lungs clear to auscultation bilaterally Abdominal: soft, non-tender, non-distended Neurological: alert & oriented x 3, 4-5 strength in the right lower extremity, straight leg test positive on the right side.  5 out of 5 strength in the left lower extremity  Skin: warm and dry, no vesicles or rashes visualized Psych: Anxious, and tearful mood intermittently.  Range of emotions from happy to sad very quickly  Assessment & Plan:   Type 2 diabetes mellitus with diabetic polyneuropathy [facility]) Patient presents for follow-up for type 2 diabetes mellitus.  A1c in March 2024 was 11.7, on today's visit is 9.1.  Her current regimen is metformin at 1000 mg twice a day, and Ozempic 1 mg.  Of note, patient stopped taking the Jardiance due to dysuria.  She has made much progress with her diet, and is eating much better.  She has also incorporated regular exercise into her routine, on top of her job.  Will continue current regimen.  Plan: - Ozempic 1 mg - Metformin 1000 mg twice a day - Can consider adding on Jardiance and next visit  Lumbar radiculopathy Patient was evaluated in clinic previously, with complaints of neuropathic pain in the L5 dermatome pattern on the left side.  She had been recently evaluated in  the emergency department before then, was diagnosed with postherpetic neuralgia.  She was started on gabapentin 300 - 300 - 600, she states had helped the pain.  She was also started on valacyclovir which also helped with the pain.  Now, patient has recurrence of this pain, however does not have rash associated.  She has not had a rash since her initial diagnosis.  She is also complaining of right-sided shooting, intermittent sharp pain that started  because she has to sleep on her right side.  On inspection, there are no rashes or vesicular lesions present on the left or right side the L4-L5 dermatome.  Right leg is 4 out of 5 strength, straight leg test is positive on the right side as well.  On the left side, there is 5 out of 5 strength.  She denies any red flag symptoms such as weight loss, incontinence fevers, chills.  Plan: - Less likely that this is postherpetic neuralgia as timeline would not make sense.  Given new onset right-sided weakness and radiculopathic pain I believe imaging is warranted.  Will start with x-ray of lumbar spine - Referral to physical therapy - Instructed patient to take 600 mg of gabapentin at 3 times a day  NSTEMI (non-ST elevated myocardial infarction) [facility]) Patient was initially diagnosed with this in 2021, and was supposed to be on aspirin and Brilinta for only a year.  However she is continue to take Brilinta.  She has not had good follow-up with her cardiologist.  Encourage patient to follow-up with them, and in the meanwhile we will stop Brilinta and encourage patient to continue aspirin.  Major depressive disorder, recurrent, severe without psychotic features (HCC) Refilled patient's hydroxyzine.  She is following up in the outpatient setting with a psychiatrist who is managing these medications.  Patient discussed with Dr. Rockey Situ, M.D. [facility] Internal Medicine, PGY-1 Pager: [phone] Date 03/31/2023 Time 3:41 PM

## 2023-03-31 NOTE — Assessment & Plan Note (Signed)
Patient was evaluated in clinic previously, with complaints of neuropathic pain in the L5 dermatome pattern on the left side.  She had been recently evaluated in the emergency department before then, was diagnosed with postherpetic neuralgia.  She was started on gabapentin 300 - 300 - 600, she states had helped the pain.  She was also started on valacyclovir which also helped with the pain.  Now, patient has recurrence of this pain, however does not have rash associated.  She has not had a rash since her initial diagnosis.  She is also complaining of right-sided shooting, intermittent sharp pain that started because she has to sleep on her right side.  On inspection, there are no rashes or vesicular lesions present on the left or right side the L4-L5 dermatome.  Right leg is 4 out of 5 strength, straight leg test is positive on the right side as well.  On the left side, there is 5 out of 5 strength.  She denies any red flag symptoms such as weight loss, incontinence fevers, chills.  Plan: - Less likely that this is postherpetic neuralgia as timeline would not make sense.  Given new onset right-sided weakness and radiculopathic pain I believe imaging is warranted.  Will start with x-ray of lumbar spine - Referral to physical therapy - Instructed patient to take 600 mg of gabapentin at 3 times a day

## 2023-03-31 NOTE — Assessment & Plan Note (Signed)
Patient was initially diagnosed with this in 2021, and was supposed to be on aspirin and Brilinta for only a year.  However she is continue to take Brilinta.  She has not had good follow-up with her cardiologist.  Encourage patient to follow-up with them, and in the meanwhile we will stop Brilinta and encourage patient to continue aspirin.

## 2023-03-31 NOTE — Patient Instructions (Signed)
Thank you so much for coming to the clinic today!   For your pain - I want you take Gabapentin 600mg  with breakfast, lunch, and dinner. We are going to get an X-Ray of your lower back, and I put in the referral for physical therapy.   For your diabetes- You're doing great, I want you to keep making good food decisions like you're doing! We are going to go up on your ozempic to 1mg  weekly, I have sent in a prescription for it.   For your blood pressure - Please make an appointment with your heart doctor, I don't think the Diltiazem is the best medication for you right now, but we will see you in a month to further evaluate. Please keep a log of your blood pressure at home, and bring it into your next appointment.   Also, please stop taking the Brillinta, and continue taking the Aspirin.   If you have any questions please feel free to the call the clinic at anytime at [phone]. It was a pleasure seeing you!  Best, Dr. Thomasene Ripple

## 2023-03-31 NOTE — Assessment & Plan Note (Signed)
Refilled patient's hydroxyzine.  She is following up in the outpatient setting with a psychiatrist who is managing these medications.

## 2023-03-31 NOTE — Assessment & Plan Note (Signed)
Patient presents for follow-up for type 2 diabetes mellitus.  A1c in March 2024 was 11.7, on today's visit is 9.1.  Her current regimen is metformin at 1000 mg twice a day, and Ozempic 1 mg.  Of note, patient stopped taking the Jardiance due to dysuria.  She has made much progress with her diet, and is eating much better.  She has also incorporated regular exercise into her routine, on top of her job.  Will continue current regimen.  Plan: - Ozempic 1 mg - Metformin 1000 mg twice a day - Can consider adding on Jardiance and next visit

## 2023-04-05 NOTE — Progress Notes (Signed)
Internal Medicine Clinic Attending  Case discussed with the resident at the time of the visit.  We reviewed the resident's history and exam and pertinent patient test results.  I agree with the assessment, diagnosis, and plan of care documented in the resident's note.  

## 2023-04-22 ENCOUNTER — Ambulatory Visit: Payer: Medicaid Other

## 2023-05-01 ENCOUNTER — Telehealth: Payer: Self-pay

## 2023-05-01 NOTE — Telephone Encounter (Signed)
DECISION :   Message from Plan  Approved.    This drug has been approved. Approved quantity: 3 units per 28 day(s).    The drug has been approved from 04/17/2023 to 04/30/2024.   Please call the pharmacy to process your prescription claim. Generic or biosimilar substitution may be required when available and preferred on the formulary..     Authorization Expiration Date: April 30, 2024.      ( COPY PLACED TO SCAN TO CHART AND ALSO FAXED TO PHARMACY )

## 2023-05-01 NOTE — Telephone Encounter (Signed)
Pa for pt ( OZEMPIC 1MG / DOSE )  came through on cover my meds was submitted with last office notes and labs .Marland Kitchen Awaiting approval or denial

## 2023-05-06 NOTE — Therapy (Signed)
OUTPATIENT PHYSICAL THERAPY THORACOLUMBAR EVALUATION   Patient Name: Vanessa Case MRN: [id number] DOB:11/23/1970, 52 y.o., female Today's Date: 05/08/2023  END OF SESSION:  PT End of Session - 05/08/23 1256     Visit Number 1    Number of Visits 5    Date for PT Re-Evaluation 07/09/23    Authorization Type MCD    PT Start Time 1215    PT Stop Time 1300    PT Time Calculation (min) 45 min    Activity Tolerance Patient tolerated treatment well    Behavior During Therapy [facility] for tasks assessed/performed             Past Medical History:  Diagnosis Date   Anemia    Anxiety    Arthritis    Cholelithiasis with acute cholecystitis 06/28/2013   Chronic abdominal pain    Chronic back pain    Chronic headache    Chronic neck pain    Coronary artery disease    Depression    Diabetes mellitus    adult onset dm-maintained on glipizide and metformin, pt states fasting glucose runs 110   Fibromyalgia    GERD (gastroesophageal reflux disease)    HLD (hyperlipidemia) 05/30/2015   Hyperlipidemia    Hypertension    maintained on lisinopril hct, metoprolol-134/86 at pat visit   Neuromuscular disorder (HCC)    Neuropathy    NSTEMI (non-ST elevated myocardial infarction) (HCC) 02/05/2020   Obesity    PTSD (post-traumatic stress disorder)    Right arm weakness 03/05/2013   Substance abuse (HCC)    Past Surgical History:  Procedure Laterality Date   ABDOMINAL HYSTERECTOMY  10/30/2011   Procedure: HYSTERECTOMY ABDOMINAL;  Surgeon: Bing Plume, MD;  Location: [facility] ORS;  Service: Gynecology;  Laterality: N/A;   CESAREAN SECTION  x3   CHOLECYSTECTOMY N/A 06/28/2013   Procedure: LAPAROSCOPIC CHOLECYSTECTOMY;  Surgeon: Shelly Rubenstein, MD;  Location: [facility] OR;  Service: General;  Laterality: N/A;   CORONARY STENT INTERVENTION N/A 02/07/2020   Procedure: CORONARY STENT INTERVENTION;  Surgeon: Marykay Lex, MD;  Location: [facility] INVASIVE CV LAB;  Service: Cardiovascular;   Laterality: N/A;   LEFT HEART CATH AND CORONARY ANGIOGRAPHY N/A 02/07/2020   Procedure: LEFT HEART CATH AND CORONARY ANGIOGRAPHY;  Surgeon: Marykay Lex, MD;  Location: [facility] INVASIVE CV LAB;  Service: Cardiovascular;  Laterality: N/A;   SALPINGOOPHORECTOMY  10/30/2011   Procedure: SALPINGO OOPHERECTOMY;  Surgeon: Bing Plume, MD;  Location: [facility] OR[facility];  Service: Gynecology;  Laterality: Right;   WRIST SURGERY     carpel tunnel and tendonitis   Patient Active Problem List   Diagnosis Date Noted   Lumbar radiculopathy 03/31/2023   Postherpetic neuralgia 12/24/2022   Substance abuse (HCC) 02/08/2020   NSTEMI (non-ST elevated myocardial infarction) (HCC) 02/05/2020   Major depressive disorder, recurrent, severe without psychotic features (HCC)    Type 2 diabetes mellitus with diabetic polyneuropathy (HCC) 05/30/2015   Essential hypertension 05/30/2015   Hyperlipidemia 05/30/2015   Bipolar 2 disorder, major depressive episode (HCC) 09/20/2014    PCP:  Olegario Messier, MD   REFERRING PROVIDER: Gust Rung, DO  REFERRING DIAG: M54.16 (ICD-10-CM) - Lumbar radiculopathy  Rationale for Evaluation and Treatment: Rehabilitation  THERAPY DIAG:  Other low back pain  Radiculopathy, lumbar region  Muscle weakness (generalized)  ONSET DATE: chronic  SUBJECTIVE:  SUBJECTIVE STATEMENT: Relates a history low back pain and R sided thigh pain.  Symptoms began in March and is worsening over time.  Had radiofrequency ablation 6 years ago which resolved pain.  Developed shingles in January which effects L side and is intolerant of pressure or L side lie.  PERTINENT HISTORY:  Lumbar radiculopathy Patient was evaluated in clinic previously, with complaints of neuropathic pain in the L5 dermatome pattern on the  left side.  She had been recently evaluated in the emergency department before then, was diagnosed with postherpetic neuralgia.  She was started on gabapentin 300 - 300 - 600, she states had helped the pain.  She was also started on valacyclovir which also helped with the pain.  Now, patient has recurrence of this pain, however does not have rash associated.  She has not had a rash since her initial diagnosis.  She is also complaining of right-sided shooting, intermittent sharp pain that started because she has to sleep on her right side.   On inspection, there are no rashes or vesicular lesions present on the left or right side the L4-L5 dermatome.  Right leg is 4 out of 5 strength, straight leg test is positive on the right side as well.  On the left side, there is 5 out of 5 strength.   She denies any red flag symptoms such as weight loss, incontinence fevers, chills.   Plan: - Less likely that this is postherpetic neuralgia as timeline would not make sense.  Given new onset right-sided weakness and radiculopathic pain I believe imaging is warranted.  Will start with x-ray of lumbar spine - Referral to physical therapy - Instructed patient to take 600 mg of gabapentin at 3 times a day  PAIN:  Are you having pain? Yes: NPRS scale: 8/10 Pain location: R low back and thigh region Pain description: ache, burning Aggravating factors: activity Relieving factors: meds and rest/position changes  PRECAUTIONS: None  RED FLAGS: Bowel or bladder incontinence: No   WEIGHT BEARING RESTRICTIONS: No  FALLS:  Has patient fallen in last 6 months? No  OCCUPATION: not working  PLOF: Independent  PATIENT GOALS: To reduce and manage my back pain  NEXT MD VISIT: 1 week  OBJECTIVE:   DIAGNOSTIC FINDINGS:  Imaging studies pending  PATIENT SURVEYS:  FOTO 25(41 predicted)  MUSCLE LENGTH: Hamstrings: Right 80 deg; Left 80 deg  POSTURE: deferred   PALPATION: Deferred due to shingles  pain  LUMBAR ROM:   AROM eval  Flexion   Extension   Right lateral flexion   Left lateral flexion   Right rotation   Left rotation    (Blank rows = not tested)  LOWER EXTREMITY ROM:   WFL   Active  Right eval Left eval  Hip flexion    Hip extension    Hip abduction    Hip adduction    Hip internal rotation    Hip external rotation    Knee flexion    Knee extension    Ankle dorsiflexion    Ankle plantarflexion    Ankle inversion    Ankle eversion     (Blank rows = not tested)  LOWER EXTREMITY MMT:    MMT Right eval Left eval  Hip flexion 4-   Hip extension 4-   Hip abduction 4-   Hip adduction    Hip internal rotation    Hip external rotation    Knee flexion    Knee extension    Ankle dorsiflexion  Ankle plantarflexion 4-   Ankle inversion    Ankle eversion     (Blank rows = not tested)  LUMBAR SPECIAL TESTS:  Straight leg raise test: Negative and Slump test: Negative  FUNCTIONAL TESTS:  30 seconds chair stand test  GAIT: Distance walked: 62ft x2 Assistive device utilized: None Level of assistance: Complete Independence Comments: slower cadence  TODAY'S TREATMENT:                                                                                                                              DATE: 05/08/23 Eval    PATIENT EDUCATION:  Education details: Discussed eval findings, rehab rationale and POC and patient is in agreement  Person educated: Patient Education method: Explanation Education comprehension: verbalized understanding and needs further education  HOME EXERCISE PROGRAM: Access Code: [email or web address] URL: [email or web address] Date: 05/08/2023 Prepared by: Gustavus Bryant  Exercises - Supine Bridge  - 2 x daily - 5 x weekly - 2 sets - 10 reps - Small Range Straight Leg Raise  - 2 x daily - 5 x weekly - 2 sets - 10 reps - Sit to Stand with Arms Crossed  - 2 x daily - 5 x weekly - 1 sets - 5 reps  ASSESSMENT:  CLINICAL  IMPRESSION: Patient is a 52 y.o. female who was seen today for physical therapy evaluation and treatment for R lumbar radiculopathy of unknown etiology.   OBJECTIVE IMPAIRMENTS: decreased activity tolerance, decreased endurance, decreased knowledge of condition, decreased mobility, difficulty walking, decreased ROM, decreased strength, and pain.   ACTIVITY LIMITATIONS: carrying, lifting, standing, and stairs  PERSONAL FACTORS: Behavior pattern, Past/current experiences, Time since onset of injury/illness/exacerbation, and 1 comorbidity: active shingles  are also affecting patient's functional outcome.   REHAB POTENTIAL: Good  CLINICAL DECISION MAKING: Stable/uncomplicated  EVALUATION COMPLEXITY: Low   GOALS: Goals reviewed with patient? No  SHORT TERM GOALS: Target date: 06/19/2023  Patient to demonstrate independence in HEP  Baseline: [email or web address] Goal status: INITIAL  2.  Increase reps on 30 sec stand test to 10 w/UE support Baseline: 8 w/UE support Goal status: INITIAL  3.  Increase FOTO score to 41 Baseline: 25 Goal status: INITIAL  4.  Increase RLE strength to 4/5 Baseline:  MMT Right eval Left eval  Hip flexion 4-   Hip extension 4-   Hip abduction 4-    Goal status: INITIAL   PLAN:  PT FREQUENCY: 1-2x/week  PT DURATION: 6 weeks  PLANNED INTERVENTIONS: Therapeutic exercises, Therapeutic activity, Neuromuscular re-education, Balance training, Gait training, Patient/Family education, Self Care, Joint mobilization, Stair training, Aquatic Therapy, Dry Needling, Electrical stimulation, Spinal mobilization, Cryotherapy, Moist heat, Manual therapy, and Re-evaluation.  PLAN FOR NEXT SESSION: HEP review and update, manual techniques as appropriate, aerobic tasks, ROM and flexibility activities, strengthening and PREs, TPDN, gait and balance training as needed    Check all possible CPT codes: 10626 - PT Re-evaluation, 97110- Therapeutic Exercise, [id number]- Neuro  Re-education, [phone] -  Gait Training, 16109 - Manual Therapy, [id number] - Therapeutic Activities, and [phone] - Self Care    Check all conditions that are expected to impact treatment: {Conditions expected to impact treatment:Diabetes mellitus and Neurological condition and/or seizures   If treatment provided at initial evaluation, no treatment charged due to lack of authorization.       Hildred Laser, PT 05/08/2023, 12:58 PM

## 2023-05-07 ENCOUNTER — Encounter: Payer: Medicaid Other | Admitting: Student

## 2023-05-08 ENCOUNTER — Other Ambulatory Visit: Payer: Self-pay

## 2023-05-08 ENCOUNTER — Ambulatory Visit: Payer: Medicaid Other | Attending: Internal Medicine

## 2023-05-08 DIAGNOSIS — M6281 Muscle weakness (generalized): Secondary | ICD-10-CM | POA: Insufficient documentation

## 2023-05-08 DIAGNOSIS — M5459 Other low back pain: Secondary | ICD-10-CM | POA: Insufficient documentation

## 2023-05-08 DIAGNOSIS — M5416 Radiculopathy, lumbar region: Secondary | ICD-10-CM | POA: Insufficient documentation

## 2023-05-08 NOTE — Addendum Note (Signed)
Addended by: Hildred Laser on: 05/08/2023 01:07 PM   Modules accepted: Orders

## 2023-05-12 NOTE — Therapy (Unsigned)
OUTPATIENT PHYSICAL THERAPY TREATMENT NOTE   Patient Name: Vanessa Case MRN: [id number] DOB:1970/11/13, 52 y.o., female Today's Date: 05/14/2023  END OF SESSION:  PT End of Session - 05/14/23 0912     Visit Number 2    Number of Visits 5    Date for PT Re-Evaluation 07/09/23    Authorization Type MCD    PT Start Time 0915    Activity Tolerance Patient tolerated treatment well              Past Medical History:  Diagnosis Date   Anemia    Anxiety    Arthritis    Cholelithiasis with acute cholecystitis 06/28/2013   Chronic abdominal pain    Chronic back pain    Chronic headache    Chronic neck pain    Coronary artery disease    Depression    Diabetes mellitus    adult onset dm-maintained on glipizide and metformin, pt states fasting glucose runs 110   Fibromyalgia    GERD (gastroesophageal reflux disease)    HLD (hyperlipidemia) 05/30/2015   Hyperlipidemia    Hypertension    maintained on lisinopril hct, metoprolol-134/86 at pat visit   Neuromuscular disorder (HCC)    Neuropathy    NSTEMI (non-ST elevated myocardial infarction) (HCC) 02/05/2020   Obesity    PTSD (post-traumatic stress disorder)    Right arm weakness 03/05/2013   Substance abuse (HCC)    Past Surgical History:  Procedure Laterality Date   ABDOMINAL HYSTERECTOMY  10/30/2011   Procedure: HYSTERECTOMY ABDOMINAL;  Surgeon: Bing Plume, MD;  Location: [facility];  Service: Gynecology;  Laterality: N/A;   CESAREAN SECTION  x3   CHOLECYSTECTOMY N/A 06/28/2013   Procedure: LAPAROSCOPIC CHOLECYSTECTOMY;  Surgeon: Shelly Rubenstein, MD;  Location: [facility];  Service: General;  Laterality: N/A;   CORONARY STENT INTERVENTION N/A 02/07/2020   Procedure: CORONARY STENT INTERVENTION;  Surgeon: Marykay Lex, MD;  Location: [facility] INVASIVE CV LAB;  Service: Cardiovascular;  Laterality: N/A;   LEFT HEART CATH AND CORONARY ANGIOGRAPHY N/A 02/07/2020   Procedure: LEFT HEART CATH AND CORONARY ANGIOGRAPHY;   Surgeon: Marykay Lex, MD;  [facility] INVASIVE CV LAB;  Service: Cardiovascular;  Laterality: N/A;   SALPINGOOPHORECTOMY  10/30/2011   Procedure: SALPINGO OOPHERECTOMY;  Surgeon: Bing Plume, MD;  Location: [facility];  Service: Gynecology;  Laterality: Right;   WRIST SURGERY     carpel tunnel and tendonitis   Patient Active Problem List   Diagnosis Date Noted   Lumbar radiculopathy 03/31/2023   Postherpetic neuralgia 12/24/2022   Substance abuse (HCC) 02/08/2020   NSTEMI (non-ST elevated myocardial infarction) (HCC) 02/05/2020   Major depressive disorder, recurrent, severe without psychotic features (HCC)    Type 2 diabetes mellitus with diabetic polyneuropathy (HCC) 05/30/2015   Essential hypertension 05/30/2015   Hyperlipidemia 05/30/2015   Bipolar 2 disorder, major depressive episode (HCC) 09/20/2014    PCP:  Olegario Messier, MD   REFERRING PROVIDER: Gust Rung, DO  REFERRING DIAG: M54.16 (ICD-10-CM) - Lumbar radiculopathy  Rationale for Evaluation and Treatment: Rehabilitation  THERAPY DIAG:  Other low back pain  Radiculopathy, lumbar region  Muscle weakness (generalized)  ONSET DATE: chronic  SUBJECTIVE:  SUBJECTIVE STATEMENT: No changes since last session.  Still awaiting imaging studies/x-ray.  MD visit postponed until next week.  PERTINENT HISTORY:  Lumbar radiculopathy Patient was evaluated in clinic previously, with complaints of neuropathic pain in the L5 dermatome pattern on the left side.  She had been recently evaluated in the emergency department before then, was diagnosed with postherpetic neuralgia.  She was started on gabapentin 300 - 300 - 600, she states had helped the pain.  She was also started on valacyclovir which also helped with the pain.  Now, patient has  recurrence of this pain, however does not have rash associated.  She has not had a rash since her initial diagnosis.  She is also complaining of right-sided shooting, intermittent sharp pain that started because she has to sleep on her right side.   On inspection, there are no rashes or vesicular lesions present on the left or right side the L4-L5 dermatome.  Right leg is 4 out of 5 strength, straight leg test is positive on the right side as well.  On the left side, there is 5 out of 5 strength.   She denies any red flag symptoms such as weight loss, incontinence fevers, chills.   Plan: - Less likely that this is postherpetic neuralgia as timeline would not make sense.  Given new onset right-sided weakness and radiculopathic pain I believe imaging is warranted.  Will start with x-ray of lumbar spine - Referral to physical therapy - Instructed patient to take 600 mg of gabapentin at 3 times a day  PAIN:  Are you having pain? Yes: NPRS scale: 8/10 Pain location: R low back and thigh region Pain description: ache, burning Aggravating factors: activity Relieving factors: meds and rest/position changes  PRECAUTIONS: None  RED FLAGS: Bowel or bladder incontinence: No   WEIGHT BEARING RESTRICTIONS: No  FALLS:  Has patient fallen in last 6 months? No  OCCUPATION: not working  PLOF: Independent  PATIENT GOALS: To reduce and manage my back pain  NEXT MD VISIT: 1 week  OBJECTIVE:   DIAGNOSTIC FINDINGS:  Imaging studies pending  PATIENT SURVEYS:  FOTO 25(41 predicted)  MUSCLE LENGTH: Hamstrings: Right 80 deg; Left 80 deg  POSTURE: deferred   PALPATION: Deferred due to shingles pain  LUMBAR ROM:   AROM eval  Flexion   Extension   Right lateral flexion   Left lateral flexion   Right rotation   Left rotation    (Blank rows = not tested)  LOWER EXTREMITY ROM:   WFL   Active  Right eval Left eval  Hip flexion    Hip extension    Hip abduction    Hip adduction     Hip internal rotation    Hip external rotation    Knee flexion    Knee extension    Ankle dorsiflexion    Ankle plantarflexion    Ankle inversion    Ankle eversion     (Blank rows = not tested)  LOWER EXTREMITY MMT:    MMT Right eval Left eval  Hip flexion 4-   Hip extension 4-   Hip abduction 4-   Hip adduction    Hip internal rotation    Hip external rotation    Knee flexion    Knee extension    Ankle dorsiflexion    Ankle plantarflexion 4-   Ankle inversion    Ankle eversion     (Blank rows = not tested)  LUMBAR SPECIAL TESTS:  Straight leg raise test:  Negative and Slump test: Negative  FUNCTIONAL TESTS:  30 seconds chair stand test  GAIT: Distance walked: 29ft x2 Assistive device utilized: None Level of assistance: Complete Independence Comments: slower cadence  TODAY'S TREATMENT:     [facility] Adult PT Treatment:                                                DATE: 05/14/23 Therapeutic Exercise: Nustep L2  Seated hamstring stretch 30s x2 B Seated sciatic nerve glide 10x B SLR 15/15 rest breaks need on R PPT 3s hold 10x PPT with march 10/10 QL stretch 30s x2 B                                                                                                                         DATE: 05/08/23 Eval    PATIENT EDUCATION:  Education details: Discussed eval findings, rehab rationale and POC and patient is in agreement  Person educated: Patient Education method: Explanation Education comprehension: verbalized understanding and needs further education  HOME EXERCISE PROGRAM: Access Code: [email or web address] URL: [email or web address] Date: 05/08/2023 Prepared by: Gustavus Bryant  Exercises - Supine Bridge  - 2 x daily - 5 x weekly - 2 sets - 10 reps - Small Range Straight Leg Raise  - 2 x daily - 5 x weekly - 2 sets - 10 reps - Sit to Stand with Arms Crossed  - 2 x daily - 5 x weekly - 1 sets - 5 reps  ASSESSMENT:  CLINICAL IMPRESSION: Patient  returns for first f/u session with no changes to note. Today's session focused on stretching, core and trunk strengthening, aerobic conditioning as well as HEP review.  Struggled with R SLR due to fatigue not weakness.  OBJECTIVE IMPAIRMENTS: decreased activity tolerance, decreased endurance, decreased knowledge of condition, decreased mobility, difficulty walking, decreased ROM, decreased strength, and pain.   ACTIVITY LIMITATIONS: carrying, lifting, standing, and stairs  PERSONAL FACTORS: Behavior pattern, Past/current experiences, Time since onset of injury/illness/exacerbation, and 1 comorbidity: active shingles  are also affecting patient's functional outcome.   REHAB POTENTIAL: Good  CLINICAL DECISION MAKING: Stable/uncomplicated  EVALUATION COMPLEXITY: Low   GOALS: Goals reviewed with patient? No  SHORT TERM GOALS=LONG TERM GOALS: Target date: 06/19/2023  Patient to demonstrate independence in HEP  Baseline: [id number] Goal status: INITIAL  2.  Increase reps on 30 sec stand test to 10 w/UE support Baseline: 8 w/UE support Goal status: INITIAL  3.  Increase FOTO score to 41 Baseline: 25 Goal status: INITIAL  4.  Increase RLE strength to 4/5 Baseline:  MMT Right eval Left eval  Hip flexion 4-   Hip extension 4-   Hip abduction 4-    Goal status: INITIAL   PLAN:  PT FREQUENCY: 1-2x/week  PT DURATION: 6 weeks  PLANNED INTERVENTIONS: Therapeutic exercises, Therapeutic activity, Neuromuscular re-education, Balance training, Gait  training, Patient/Family education, Self Care, Joint mobilization, Stair training, Aquatic Therapy, Dry Needling, Electrical stimulation, Spinal mobilization, Cryotherapy, Moist heat, Manual therapy, and Re-evaluation.  PLAN FOR NEXT SESSION: HEP review and update, manual techniques as appropriate, aerobic tasks, ROM and flexibility activities, strengthening and PREs, TPDN, gait and balance training as needed    Check all possible CPT  codes: 96295 - PT Re-evaluation, 97110- Therapeutic Exercise, [phone]- Neuro Re-education, [phone] - Gait Training, [phone] - Manual Therapy, 97530 - Therapeutic Activities, and [phone] - Self Care    Check all conditions that are expected to impact treatment: {Conditions expected to impact treatment:Diabetes mellitus and Neurological condition and/or seizures   If treatment provided at initial evaluation, no treatment charged due to lack of authorization.       Hildred Laser, PT 05/14/2023, 9:54 AM

## 2023-05-13 ENCOUNTER — Ambulatory Visit (HOSPITAL_COMMUNITY): Payer: Medicaid Other | Admitting: Physical Therapy

## 2023-05-14 ENCOUNTER — Ambulatory Visit: Payer: Medicaid Other

## 2023-05-14 DIAGNOSIS — M5459 Other low back pain: Secondary | ICD-10-CM

## 2023-05-14 DIAGNOSIS — M5416 Radiculopathy, lumbar region: Secondary | ICD-10-CM

## 2023-05-14 DIAGNOSIS — M6281 Muscle weakness (generalized): Secondary | ICD-10-CM

## 2023-05-19 ENCOUNTER — Emergency Department (HOSPITAL_COMMUNITY)
Admission: EM | Admit: 2023-05-19 | Discharge: 2023-05-20 | Disposition: A | Discharge status: Home / Self Care | Payer: Medicaid Other | Attending: Emergency Medicine | Admitting: Emergency Medicine

## 2023-05-19 ENCOUNTER — Encounter (HOSPITAL_COMMUNITY): Payer: Self-pay | Admitting: Emergency Medicine

## 2023-05-19 ENCOUNTER — Other Ambulatory Visit: Payer: Self-pay

## 2023-05-19 DIAGNOSIS — M7918 Myalgia, other site: Secondary | ICD-10-CM | POA: Insufficient documentation

## 2023-05-19 DIAGNOSIS — Z7982 Long term (current) use of aspirin: Secondary | ICD-10-CM | POA: Insufficient documentation

## 2023-05-19 DIAGNOSIS — R112 Nausea with vomiting, unspecified: Secondary | ICD-10-CM | POA: Insufficient documentation

## 2023-05-19 DIAGNOSIS — F332 Major depressive disorder, recurrent severe without psychotic features: Secondary | ICD-10-CM | POA: Insufficient documentation

## 2023-05-19 DIAGNOSIS — B029 Zoster without complications: Secondary | ICD-10-CM | POA: Insufficient documentation

## 2023-05-19 DIAGNOSIS — F419 Anxiety disorder, unspecified: Secondary | ICD-10-CM | POA: Diagnosis not present

## 2023-05-19 LAB — COMPREHENSIVE METABOLIC PANEL
ALT: 21 U/L (ref 0–44)
AST: 17 U/L (ref 15–41)
Albumin: 4.2 g/dL (ref 3.5–5.0)
Alkaline Phosphatase: 65 U/L (ref 38–126)
Anion gap: 13 (ref 5–15)
BUN: 11 mg/dL (ref 6–20)
CO2: 22 mmol/L (ref 22–32)
Calcium: 9.9 mg/dL (ref 8.9–10.3)
Chloride: 103 mmol/L (ref 98–111)
Creatinine, Ser: 0.76 mg/dL (ref 0.44–1.00)
GFR, Estimated: >60 mL/min (ref >60)
Glucose, Bld: 125 mg/dL — ABNORMAL HIGH (ref 70–99)
Potassium: 3.3 mmol/L — ABNORMAL LOW (ref 3.5–5.1)
Sodium: 138 mmol/L (ref 135–145)
Total Bilirubin: 0.7 mg/dL (ref 0.3–1.2)
Total Protein: 7.8 g/dL (ref 6.5–8.1)

## 2023-05-19 LAB — CBC WITH DIFFERENTIAL/PLATELET
Abs Immature Granulocytes: 0.02 10*3/uL (ref 0.00–0.07)
Basophils Absolute: 0 10*3/uL (ref 0.0–0.1)
Basophils Relative: 1 %
Eosinophils Absolute: 0.1 10*3/uL (ref 0.0–0.5)
Eosinophils Relative: 1 %
HCT: 37.7 % (ref 36.0–46.0)
Hemoglobin: 12.4 g/dL (ref 12.0–15.0)
Immature Granulocytes: 0 %
Lymphocytes Relative: 42 %
Lymphs Abs: 3 10*3/uL (ref 0.7–4.0)
MCH: 28.9 pg (ref 26.0–34.0)
MCHC: 32.9 g/dL (ref 30.0–36.0)
MCV: 87.9 fL (ref 80.0–100.0)
Monocytes Absolute: 0.4 10*3/uL (ref 0.1–1.0)
Monocytes Relative: 5 %
Neutro Abs: 3.7 10*3/uL (ref 1.7–7.7)
Neutrophils Relative %: 51 %
Platelets: 258 10*3/uL (ref 150–400)
RBC: 4.29 MIL/uL (ref 3.87–5.11)
RDW: 13.6 % (ref 11.5–15.5)
WBC: 7.3 10*3/uL (ref 4.0–10.5)
nRBC: 0 % (ref 0.0–0.2)

## 2023-05-19 MED ORDER — GABAPENTIN 300 MG PO CAPS
600.0000 mg | ORAL_CAPSULE | Freq: Once | ORAL | Status: AC
  Administered 2023-05-19: 600 mg via ORAL
  Filled 2023-05-19: qty 2

## 2023-05-19 MED ORDER — ONDANSETRON 4 MG PO TBDP
4.0000 mg | ORAL_TABLET | Freq: Once | ORAL | Status: AC
  Administered 2023-05-19: 4 mg via ORAL
  Filled 2023-05-19: qty 1

## 2023-05-19 NOTE — ED Provider Notes (Signed)
MSE:  Pt presents to the ED today via EMS with pain all over as well as n/v.  The pt said the pharmacy would not let her pick up her neurontin because it was not yet time.  She has an appt tomorrow with her pcp and did not think she could wait.  Shingles have healed.  She has post-shingles neuropathy.  She can't keep anything down.  She is very anxious.  Labs ordered.  Neurontin and zofran ordered.  She is aware she is going to the lobby, but that we will get things started.   Jacalyn Lefevre, MD 05/19/23 1650

## 2023-05-19 NOTE — ED Triage Notes (Signed)
Pt arrives via EMS from home with shingles. Pt went to pharmacy to get gabapentin and was told she can't get this til 2nd week of August. Reports pain all over.

## 2023-05-20 ENCOUNTER — Ambulatory Visit: Payer: Medicaid Other | Admitting: Internal Medicine

## 2023-05-20 VITALS — BP 126/85 | HR 105 | Temp 98.4°F | Ht 65.0 in | Wt 173.7 lb

## 2023-05-20 DIAGNOSIS — E1142 Type 2 diabetes mellitus with diabetic polyneuropathy: Secondary | ICD-10-CM

## 2023-05-20 DIAGNOSIS — B0229 Other postherpetic nervous system involvement: Secondary | ICD-10-CM | POA: Diagnosis not present

## 2023-05-20 MED ORDER — FLUOXETINE HCL 40 MG PO CAPS: 40.0000 mg | ORAL_CAPSULE | Freq: Every day | ORAL | 0 refills | Status: DC

## 2023-05-20 MED ORDER — GABAPENTIN 300 MG PO CAPS: 600.0000 mg | ORAL_CAPSULE | Freq: Three times a day (TID) | ORAL | 2 refills | Status: DC

## 2023-05-20 MED ORDER — TRAZODONE HCL 50 MG PO TABS: 50.0000 mg | ORAL_TABLET | Freq: Every day | ORAL | 0 refills | Status: DC

## 2023-05-20 MED ORDER — PRAZOSIN HCL 1 MG PO CAPS: 1.0000 mg | ORAL_CAPSULE | Freq: Every day | ORAL | 0 refills | Status: DC

## 2023-05-20 MED ORDER — SODIUM CHLORIDE 0.9 % IV BOLUS
1000.0000 mL | Freq: Once | INTRAVENOUS | Status: AC
  Administered 2023-05-20: 1000 mL via INTRAVENOUS

## 2023-05-20 MED ORDER — QUETIAPINE FUMARATE 50 MG PO TABS: 50.0000 mg | ORAL_TABLET | Freq: Every day | ORAL | 0 refills | Status: DC

## 2023-05-20 MED ORDER — METOCLOPRAMIDE HCL 5 MG/ML IJ SOLN
10.0000 mg | Freq: Once | INTRAMUSCULAR | Status: AC
  Administered 2023-05-20: 10 mg via INTRAVENOUS
  Filled 2023-05-20: qty 2

## 2023-05-20 MED ORDER — KETOROLAC TROMETHAMINE 30 MG/ML IJ SOLN
15.0000 mg | Freq: Once | INTRAMUSCULAR | Status: AC
  Administered 2023-05-20: 15 mg via INTRAVENOUS
  Filled 2023-05-20: qty 1

## 2023-05-20 NOTE — Therapy (Deleted)
OUTPATIENT PHYSICAL THERAPY TREATMENT NOTE   Patient Name: Vanessa Case MRN: [id number] DOB:1971/03/16, 52 y.o., female Today's Date: 05/20/2023  END OF SESSION:     Past Medical History:  Diagnosis Date   Anemia    Anxiety    Arthritis    Cholelithiasis with acute cholecystitis 06/28/2013   Chronic abdominal pain    Chronic back pain    Chronic headache    Chronic neck pain    Coronary artery disease    Depression    Diabetes mellitus    adult onset dm-maintained on glipizide and metformin, pt states fasting glucose runs 110   Fibromyalgia    GERD (gastroesophageal reflux disease)    HLD (hyperlipidemia) 05/30/2015   Hyperlipidemia    Hypertension    maintained on lisinopril hct, metoprolol-134/86 at pat visit   Neuromuscular disorder (HCC)    Neuropathy    NSTEMI (non-ST elevated myocardial infarction) (HCC) 02/05/2020   Obesity    PTSD (post-traumatic stress disorder)    Right arm weakness 03/05/2013   Substance abuse (HCC)    Past Surgical History:  Procedure Laterality Date   ABDOMINAL HYSTERECTOMY  10/30/2011   Procedure: HYSTERECTOMY ABDOMINAL;  Surgeon: Bing Plume, MD;  Location: [facility];  Service: Gynecology;  Laterality: N/A;   CESAREAN SECTION  x3   CHOLECYSTECTOMY N/A 06/28/2013   Procedure: LAPAROSCOPIC CHOLECYSTECTOMY;  Surgeon: Shelly Rubenstein, MD;  Location: [facility];  Service: General;  Laterality: N/A;   CORONARY STENT INTERVENTION N/A 02/07/2020   Procedure: CORONARY STENT INTERVENTION;  Surgeon: Marykay Lex, MD;  Location: [facility] INVASIVE CV LAB;  Service: Cardiovascular;  Laterality: N/A;   LEFT HEART CATH AND CORONARY ANGIOGRAPHY N/A 02/07/2020   Procedure: LEFT HEART CATH AND CORONARY ANGIOGRAPHY;  Surgeon: Marykay Lex, MD;  Location: [facility] INVASIVE CV LAB;  Service: Cardiovascular;  Laterality: N/A;   SALPINGOOPHORECTOMY  10/30/2011   Procedure: SALPINGO OOPHERECTOMY;  Surgeon: Bing Plume, MD;  Location: [facility];  Service:  Gynecology;  Laterality: Right;   WRIST SURGERY     carpel tunnel and tendonitis   Patient Active Problem List   Diagnosis Date Noted   Lumbar radiculopathy 03/31/2023   Postherpetic neuralgia 12/24/2022   Substance abuse (HCC) 02/08/2020   NSTEMI (non-ST elevated myocardial infarction) (HCC) 02/05/2020   Major depressive disorder, recurrent, severe without psychotic features (HCC)    Type 2 diabetes mellitus with diabetic polyneuropathy (HCC) 05/30/2015   Essential hypertension 05/30/2015   Hyperlipidemia 05/30/2015   Bipolar 2 disorder, major depressive episode (HCC) 09/20/2014    PCP:  Olegario Messier, MD   REFERRING PROVIDER: Gust Rung, DO  REFERRING DIAG: M54.16 (ICD-10-CM) - Lumbar radiculopathy  Rationale for Evaluation and Treatment: Rehabilitation  THERAPY DIAG:  No diagnosis found.  ONSET DATE: chronic  SUBJECTIVE:  SUBJECTIVE STATEMENT: No changes since last session.  Still awaiting imaging studies/x-ray.  MD visit postponed until next week.  PERTINENT HISTORY:  Lumbar radiculopathy Patient was evaluated in clinic previously, with complaints of neuropathic pain in the L5 dermatome pattern on the left side.  She had been recently evaluated in the emergency department before then, was diagnosed with postherpetic neuralgia.  She was started on gabapentin 300 - 300 - 600, she states had helped the pain.  She was also started on valacyclovir which also helped with the pain.  Now, patient has recurrence of this pain, however does not have rash associated.  She has not had a rash since her initial diagnosis.  She is also complaining of right-sided shooting, intermittent sharp pain that started because she has to sleep on her right side.   On inspection, there are no rashes or vesicular  lesions present on the left or right side the L4-L5 dermatome.  Right leg is 4 out of 5 strength, straight leg test is positive on the right side as well.  On the left side, there is 5 out of 5 strength.   She denies any red flag symptoms such as weight loss, incontinence fevers, chills.   Plan: - Less likely that this is postherpetic neuralgia as timeline would not make sense.  Given new onset right-sided weakness and radiculopathic pain I believe imaging is warranted.  Will start with x-ray of lumbar spine - Referral to physical therapy - Instructed patient to take 600 mg of gabapentin at 3 times a day  PAIN:  Are you having pain? Yes: NPRS scale: 8/10 Pain location: R low back and thigh region Pain description: ache, burning Aggravating factors: activity Relieving factors: meds and rest/position changes  PRECAUTIONS: None  RED FLAGS: Bowel or bladder incontinence: No   WEIGHT BEARING RESTRICTIONS: No  FALLS:  Has patient fallen in last 6 months? No  OCCUPATION: not working  PLOF: Independent  PATIENT GOALS: To reduce and manage my back pain  NEXT MD VISIT: 1 week  OBJECTIVE:   DIAGNOSTIC FINDINGS:  Imaging studies pending  PATIENT SURVEYS:  FOTO 25(41 predicted)  MUSCLE LENGTH: Hamstrings: Right 80 deg; Left 80 deg  POSTURE: deferred   PALPATION: Deferred due to shingles pain  LUMBAR ROM:   AROM eval  Flexion   Extension   Right lateral flexion   Left lateral flexion   Right rotation   Left rotation    (Blank rows = not tested)  LOWER EXTREMITY ROM:   WFL   Active  Right eval Left eval  Hip flexion    Hip extension    Hip abduction    Hip adduction    Hip internal rotation    Hip external rotation    Knee flexion    Knee extension    Ankle dorsiflexion    Ankle plantarflexion    Ankle inversion    Ankle eversion     (Blank rows = not tested)  LOWER EXTREMITY MMT:    MMT Right eval Left eval  Hip flexion 4-   Hip extension 4-    Hip abduction 4-   Hip adduction    Hip internal rotation    Hip external rotation    Knee flexion    Knee extension    Ankle dorsiflexion    Ankle plantarflexion 4-   Ankle inversion    Ankle eversion     (Blank rows = not tested)  LUMBAR SPECIAL TESTS:  Straight leg raise test:  Negative and Slump test: Negative  FUNCTIONAL TESTS:  30 seconds chair stand test  GAIT: Distance walked: 3ft x2 Assistive device utilized: None Level of assistance: Complete Independence Comments: slower cadence  TODAY'S TREATMENT:     [facility] Adult PT Treatment:                                                DATE: 05/14/23 Therapeutic Exercise: Nustep L2  Seated hamstring stretch 30s x2 B Seated sciatic nerve glide 10x B SLR 15/15 rest breaks need on R PPT 3s hold 10x PPT with march 10/10 QL stretch 30s x2 B                                                                                                                         DATE: 05/08/23 Eval    PATIENT EDUCATION:  Education details: Discussed eval findings, rehab rationale and POC and patient is in agreement  Person educated: Patient Education method: Explanation Education comprehension: verbalized understanding and needs further education  HOME EXERCISE PROGRAM: Access Code: [email or web address] URL: [email or web address] Date: 05/08/2023 Prepared by: Gustavus Bryant  Exercises - Supine Bridge  - 2 x daily - 5 x weekly - 2 sets - 10 reps - Small Range Straight Leg Raise  - 2 x daily - 5 x weekly - 2 sets - 10 reps - Sit to Stand with Arms Crossed  - 2 x daily - 5 x weekly - 1 sets - 5 reps  ASSESSMENT:  CLINICAL IMPRESSION: Patient returns for first f/u session with no changes to note. Today's session focused on stretching, core and trunk strengthening, aerobic conditioning as well as HEP review.  Struggled with R SLR due to fatigue not weakness.  OBJECTIVE IMPAIRMENTS: decreased activity tolerance, decreased endurance,  decreased knowledge of condition, decreased mobility, difficulty walking, decreased ROM, decreased strength, and pain.   ACTIVITY LIMITATIONS: carrying, lifting, standing, and stairs  PERSONAL FACTORS: Behavior pattern, Past/current experiences, Time since onset of injury/illness/exacerbation, and 1 comorbidity: active shingles  are also affecting patient's functional outcome.   REHAB POTENTIAL: Good  CLINICAL DECISION MAKING: Stable/uncomplicated  EVALUATION COMPLEXITY: Low   GOALS: Goals reviewed with patient? No  SHORT TERM GOALS=LONG TERM GOALS: Target date: 06/19/2023  Patient to demonstrate independence in HEP  Baseline: [id number] Goal status: INITIAL  2.  Increase reps on 30 sec stand test to 10 w/UE support Baseline: 8 w/UE support Goal status: INITIAL  3.  Increase FOTO score to 41 Baseline: 25 Goal status: INITIAL  4.  Increase RLE strength to 4/5 Baseline:  MMT Right eval Left eval  Hip flexion 4-   Hip extension 4-   Hip abduction 4-    Goal status: INITIAL   PLAN:  PT FREQUENCY: 1-2x/week  PT DURATION: 6 weeks  PLANNED INTERVENTIONS: Therapeutic exercises, Therapeutic activity, Neuromuscular re-education, Balance training, Gait  training, Patient/Family education, Self Care, Joint mobilization, Stair training, Aquatic Therapy, Dry Needling, Electrical stimulation, Spinal mobilization, Cryotherapy, Moist heat, Manual therapy, and Re-evaluation.  PLAN FOR NEXT SESSION: HEP review and update, manual techniques as appropriate, aerobic tasks, ROM and flexibility activities, strengthening and PREs, TPDN, gait and balance training as needed    Check all possible CPT codes: 82956 - PT Re-evaluation, 97110- Therapeutic Exercise, [phone]- Neuro Re-education, [phone] - Gait Training, [phone] - Manual Therapy, 97530 - Therapeutic Activities, and [phone] - Self Care    Check all conditions that are expected to impact treatment: {Conditions expected to impact  treatment:Diabetes mellitus and Neurological condition and/or seizures   If treatment provided at initial evaluation, no treatment charged due to lack of authorization.       Hildred Laser, PT 05/20/2023, 12:40 PM

## 2023-05-20 NOTE — Patient Instructions (Signed)
Vanessa Case,  It was nice seeing you today! Thank you for choosing [facility] for your Primary Care.    I have refilled your gabapentin for you. I would like you to follow up in about 2 months for your diabetes.  My best, Dr. August Saucer

## 2023-05-20 NOTE — ED Notes (Signed)
Discharge instructions discussed with pt. Verbalized understanding. VSS. No questions or concerns regarding discharge  

## 2023-05-20 NOTE — ED Provider Notes (Signed)
[place] EMERGENCY DEPARTMENT AT [facility] Provider Note   CSN: [id number] Arrival date & time: 05/19/23  1631     History  Chief Complaint  Patient presents with   Herpes Zoster    Vanessa Case is a 52 y.o. female.  Presents to the emergency department for evaluation of nausea and vomiting.  Patient reports that she had shingles in February and has a postherpetic neuralgia.  She has been maintained fairly well on Neurontin but ran out and the pharmacy reported that they could not refill it until August 8.  She has been experiencing nausea, vomiting, generalized aches and pains for 24 hours since she ran out of the medication.  She does not take any narcotic medication.       Home Medications Prior to Admission medications   Medication Sig Start Date End Date Taking? Authorizing Provider  aspirin EC 81 MG EC tablet Take 1 tablet (81 mg total) by mouth daily. 02/09/20   Arty Baumgartner, NP  atorvastatin (LIPITOR) 80 MG tablet Take 1 tablet (80 mg total) by mouth daily. 02/09/20   Arty Baumgartner, NP  diltiazem (CARDIZEM CD) 180 MG 24 hr capsule Take 1 capsule (180 mg total) by mouth daily. 02/10/21 11/08/21  Henderly, Britni A, PA-C  FLUoxetine (PROZAC) 40 MG capsule Take 1 capsule (40 mg total) by mouth daily. 05/20/23   Gilda Crease, MD  fluticasone (FLOVENT HFA) 44 MCG/ACT inhaler Inhale 2 puffs into the lungs 2 (two) times daily as needed (for shortness of breath). 03/14/16   Benjiman Core, MD  gabapentin (NEURONTIN) 300 MG capsule Take 2 capsules (600 mg total) by mouth 3 (three) times daily. TAKE 2 CAPSULE BY MOUTH IN THE MORNING, 2 CAPSULE IN THE EVENING, AND 2 CAPSULES AT NIGHT 03/31/23   Nooruddin, Jason Fila, MD  hydrOXYzine (ATARAX) 25 MG tablet Take 1 tablet (25 mg total) by mouth daily. 03/31/23   Nooruddin, Jason Fila, MD  metFORMIN (GLUCOPHAGE) 1000 MG tablet Take 1 tablet (1,000 mg total) by mouth 2 (two) times daily with a meal. 12/23/22    Nooruddin, Jason Fila, MD  Multiple Vitamins-Calcium (ONE-A-DAY WOMENS FORMULA PO) Take 1 tablet by mouth daily.    [provider]  nicotine polacrilex (NICORETTE) 4 MG gum Take 1 each (4 mg total) by mouth as needed for smoking cessation. Chew 1 piece every 2 hours as needed; no more than 24 pieces a day Patient not taking: Reported on 07/30/2020 02/18/20   Corrin Parker, PA-C  nitroGLYCERIN (NITROSTAT) 0.4 MG SL tablet Place 1 tablet (0.4 mg total) under the tongue every 5 (five) minutes x 3 doses as needed for chest pain. 02/08/20   Arty Baumgartner, NP  prazosin (MINIPRESS) 1 MG capsule Take 1 capsule (1 mg total) by mouth at bedtime. To prevent nightmares 05/20/23   Gilda Crease, MD  QUEtiapine (SEROQUEL) 50 MG tablet Take 1 tablet (50 mg total) by mouth at bedtime. 05/20/23   Gilda Crease, MD  Semaglutide, 1 MG/DOSE, 4 MG/3ML SOPN Inject 1 mg into the skin once a week. 03/31/23   Nooruddin, Jason Fila, MD  traZODone (DESYREL) 50 MG tablet Take 1 tablet (50 mg total) by mouth at bedtime. 05/20/23   Gilda Crease, MD      Allergies    Percocet [oxycodone-acetaminophen] and Ambien [zolpidem]    Review of Systems   Review of Systems  Physical Exam Updated Vital Signs BP (!) 140/80   Pulse (!) 102  Temp 98.2 F (36.8 C) (Oral)   Resp 17   Ht 5\' 5"  (1.651 m)   Wt 79 kg   LMP 10/07/2011   SpO2 100%   BMI 28.98 kg/m  Physical Exam Vitals and nursing note reviewed.  Constitutional:      General: She is not in acute distress.    Appearance: She is well-developed.  HENT:     Head: Normocephalic and atraumatic.     Mouth/Throat:     Mouth: Mucous membranes are moist.  Eyes:     General: Vision grossly intact. Gaze aligned appropriately.     Extraocular Movements: Extraocular movements intact.     Conjunctiva/sclera: Conjunctivae normal.  Cardiovascular:     Rate and Rhythm: Normal rate and regular rhythm.     Pulses: Normal pulses.     Heart  sounds: Normal heart sounds, S1 normal and S2 normal. No murmur heard.    No friction rub. No gallop.  Pulmonary:     Effort: Pulmonary effort is normal. No respiratory distress.     Breath sounds: Normal breath sounds.  Abdominal:     General: Bowel sounds are normal.     Palpations: Abdomen is soft.     Tenderness: There is no abdominal tenderness. There is no guarding or rebound.     Hernia: No hernia is present.  Musculoskeletal:        General: No swelling.     Cervical back: Full passive range of motion without pain, normal range of motion and neck supple. No spinous process tenderness or muscular tenderness. Normal range of motion.     Right lower leg: No edema.     Left lower leg: No edema.  Skin:    General: Skin is warm and dry.     Capillary Refill: Capillary refill takes less than 2 seconds.     Findings: No ecchymosis, erythema, rash or wound.  Neurological:     General: No focal deficit present.     Mental Status: She is alert and oriented to person, place, and time.     GCS: GCS eye subscore is 4. GCS verbal subscore is 5. GCS motor subscore is 6.     Cranial Nerves: Cranial nerves 2-12 are intact.     Sensory: Sensation is intact.     Motor: Motor function is intact.     Coordination: Coordination is intact.  Psychiatric:        Attention and Perception: Attention normal.        Mood and Affect: Mood normal.        Speech: Speech normal.        Behavior: Behavior normal.     ED Results / Procedures / Treatments   Labs (all labs ordered are listed, but only abnormal results are displayed) Labs Reviewed  COMPREHENSIVE METABOLIC PANEL - Abnormal; Notable for the following components:      Result Value   Potassium 3.3 (*)    Glucose, Bld 125 (*)    All other components within normal limits  CBC WITH DIFFERENTIAL/PLATELET  URINALYSIS, ROUTINE W REFLEX MICROSCOPIC    EKG None  Radiology No results found.  Procedures Procedures    Medications Ordered  in ED Medications  ondansetron (ZOFRAN-ODT) disintegrating tablet 4 mg (4 mg Oral Given 05/19/23 1654)  gabapentin (NEURONTIN) capsule 600 mg (600 mg Oral Given 05/19/23 1653)  sodium chloride 0.9 % bolus 1,000 mL (0 mLs Intravenous Stopped 05/20/23 0223)  ketorolac (TORADOL) 30 MG/ML injection 15 mg (  15 mg Intravenous Given 05/20/23 0033)  metoCLOPramide (REGLAN) injection 10 mg (10 mg Intravenous Given 05/20/23 0034)    ED Course/ Medical Decision Making/ A&P                             Medical Decision Making Risk Prescription drug management.   Presents to the emergency department for evaluation of pain all over.  Patient with history of postherpetic neuralgia, ran out of her gabapentin and has not had it for 24 hours.  Patient appears well.  She has had nausea and vomiting, does appear to be clinically mildly dehydrated.  Given IV fluids.  Nausea improved after Zofran.        Final Clinical Impression(s) / ED Diagnoses Final diagnoses:  Major depressive disorder, recurrent, severe without psychotic features (HCC)    Rx / DC Orders ED Discharge Orders          Ordered    QUEtiapine (SEROQUEL) 50 MG tablet  Daily at bedtime        05/20/23 0306    prazosin (MINIPRESS) 1 MG capsule  Daily at bedtime        05/20/23 0306    traZODone (DESYREL) 50 MG tablet  Daily at bedtime        05/20/23 0306    FLUoxetine (PROZAC) 40 MG capsule  Daily        05/20/23 0306              Gilda Crease, MD 05/20/23 [phone]

## 2023-05-20 NOTE — Progress Notes (Unsigned)
   CC: skin pain  HPI:  [name] is a 52 y.o. female with past medical history as detailed below who presents today with ongoing pain associated with postherpetic neuralgia. Please see problem based charting for detailed assessment and plan.  Past Medical History:  Diagnosis Date   Anemia    Anxiety    Arthritis    Cholelithiasis with acute cholecystitis 06/28/2013   Chronic abdominal pain    Chronic back pain    Chronic headache    Chronic neck pain    Coronary artery disease    Depression    Diabetes mellitus    adult onset dm-maintained on glipizide and metformin, pt states fasting glucose runs 110   Fibromyalgia    GERD (gastroesophageal reflux disease)    HLD (hyperlipidemia) 05/30/2015   Hyperlipidemia    Hypertension    maintained on lisinopril hct, metoprolol-134/86 at pat visit   Neuromuscular disorder (HCC)    Neuropathy    NSTEMI (non-ST elevated myocardial infarction) (HCC) 02/05/2020   Obesity    PTSD (post-traumatic stress disorder)    Right arm weakness 03/05/2013   Substance abuse (HCC)    Review of Systems:  Negative unless otherwise stated.  Physical Exam:  Vitals:   05/20/23 1448  BP: 126/85  Pulse: (!) 105  Temp: 98.4 F (36.9 C)  TempSrc: Oral  SpO2: 100%  Weight: 173 lb 11.2 oz (78.8 kg)  Height: 5\' 5"  (1.651 m)   Constitutional: In no acute distress. Cardio:Regular rate and rhythm.  Pulm:Clear to auscultation bilaterally. Normal work of breathing on room air. IZT:[id number] for extremity edema. Skin:Warm and dry. Increased pigmentation over L L5 dermatome where now-healed zoster rash previously was. Neuro:Alert and oriented x3. No focal deficit noted. Psych:Pleasant mood and affect.  Assessment & Plan:   See Encounters Tab for problem based charting.  Postherpetic neuralgia Previously evaluated for neuropathic pain in the L5 dermatome distribution on the L. She was prescribed gabapentin 600 mg TID at her last OV,  increased from 300-300-600 mg. Increasing to this dose has maintained good control of her pain. On skin exam, she does have scar from previous shingles rash over L L5 lumbar distribution. No active rash now. She did go to [facility] ED yesterday because she had ran out of her gabapentin and had difficulty picking up a refill at the pharmacy, and was not able to tolerate her pain until this OV.  Assessment: No signs of active shingles infection. Symptoms consistent with ongoing postherpetic neuralgia. Plan:Continue gabapentin 600 mg TID.  Patient discussed with Dr. Antony Contras

## 2023-05-21 ENCOUNTER — Ambulatory Visit: Payer: Medicaid Other

## 2023-05-21 NOTE — Assessment & Plan Note (Signed)
Previously evaluated for neuropathic pain in the L5 dermatome distribution on the L. She was prescribed gabapentin 600 mg TID at her last OV, increased from 300-300-600 mg. Increasing to this dose has maintained good control of her pain. On skin exam, she does have scar from previous shingles rash over L L5 lumbar distribution. No active rash now. She did go to [facility] ED yesterday because she had ran out of her gabapentin and had difficulty picking up a refill at the pharmacy, and was not able to tolerate her pain until this OV.  Assessment: No signs of active shingles infection. Symptoms consistent with ongoing postherpetic neuralgia. Plan:Continue gabapentin 600 mg TID.

## 2023-05-23 NOTE — Addendum Note (Signed)
Addended by: Burnell Blanks on: 05/23/2023 09:50 AM   Modules accepted: Level of Service

## 2023-05-23 NOTE — Progress Notes (Signed)
Internal Medicine Clinic Attending  Case discussed with the resident at the time of the visit.  We reviewed the resident's history and exam and pertinent patient test results.  I agree with the assessment, diagnosis, and plan of care documented in the resident's note.  

## 2023-05-28 NOTE — Therapy (Deleted)
OUTPATIENT PHYSICAL THERAPY TREATMENT NOTE   Patient Name: Vanessa Case MRN: [id number] DOB:04-03-1971, 52 y.o., female Today's Date: 05/28/2023  END OF SESSION:     Past Medical History:  Diagnosis Date   Anemia    Anxiety    Arthritis    Cholelithiasis with acute cholecystitis 06/28/2013   Chronic abdominal pain    Chronic back pain    Chronic headache    Chronic neck pain    Coronary artery disease    Depression    Diabetes mellitus    adult onset dm-maintained on glipizide and metformin, pt states fasting glucose runs 110   Fibromyalgia    GERD (gastroesophageal reflux disease)    HLD (hyperlipidemia) 05/30/2015   Hyperlipidemia    Hypertension    maintained on lisinopril hct, metoprolol-134/86 at pat visit   Neuromuscular disorder (HCC)    Neuropathy    NSTEMI (non-ST elevated myocardial infarction) (HCC) 02/05/2020   Obesity    PTSD (post-traumatic stress disorder)    Right arm weakness 03/05/2013   Substance abuse (HCC)    Past Surgical History:  Procedure Laterality Date   ABDOMINAL HYSTERECTOMY  10/30/2011   Procedure: HYSTERECTOMY ABDOMINAL;  Surgeon: Bing Plume, MD;  Location: [facility] ORS;  Service: Gynecology;  Laterality: N/A;   CESAREAN SECTION  x3   CHOLECYSTECTOMY N/A 06/28/2013   Procedure: LAPAROSCOPIC CHOLECYSTECTOMY;  Surgeon: Shelly Rubenstein, MD;  Location: [facility] OR;  Service: General;  Laterality: N/A;   CORONARY STENT INTERVENTION N/A 02/07/2020   Procedure: CORONARY STENT INTERVENTION;  Surgeon: Marykay Lex, MD;  Location: [facility] INVASIVE CV LAB;  Service: Cardiovascular;  Laterality: N/A;   LEFT HEART CATH AND CORONARY ANGIOGRAPHY N/A 02/07/2020   Procedure: LEFT HEART CATH AND CORONARY ANGIOGRAPHY;  Surgeon: Marykay Lex, MD;  Location: [facility] INVASIVE CV LAB;  Service: Cardiovascular;  Laterality: N/A;   SALPINGOOPHORECTOMY  10/30/2011   Procedure: SALPINGO OOPHERECTOMY;  Surgeon: Bing Plume, MD;  Location: [facility] ORS;  Service:  Gynecology;  Laterality: Right;   WRIST SURGERY     carpel tunnel and tendonitis   Patient Active Problem List   Diagnosis Date Noted   Lumbar radiculopathy 03/31/2023   Postherpetic neuralgia 12/24/2022   Substance abuse (HCC) 02/08/2020   NSTEMI (non-ST elevated myocardial infarction) (HCC) 02/05/2020   Major depressive disorder, recurrent, severe without psychotic features (HCC)    Type 2 diabetes mellitus with diabetic polyneuropathy (HCC) 05/30/2015   Essential hypertension 05/30/2015   Hyperlipidemia 05/30/2015   Bipolar 2 disorder, major depressive episode (HCC) 09/20/2014    PCP:  Olegario Messier, MD   REFERRING PROVIDER: Gust Rung, DO  REFERRING DIAG: M54.16 (ICD-10-CM) - Lumbar radiculopathy  Rationale for Evaluation and Treatment: Rehabilitation  THERAPY DIAG:  No diagnosis found.  ONSET DATE: chronic  SUBJECTIVE:  SUBJECTIVE STATEMENT: No changes since last session.  Still awaiting imaging studies/x-ray.  MD visit postponed until next week.  PERTINENT HISTORY:  Lumbar radiculopathy Patient was evaluated in clinic previously, with complaints of neuropathic pain in the L5 dermatome pattern on the left side.  She had been recently evaluated in the emergency department before then, was diagnosed with postherpetic neuralgia.  She was started on gabapentin 300 - 300 - 600, she states had helped the pain.  She was also started on valacyclovir which also helped with the pain.  Now, patient has recurrence of this pain, however does not have rash associated.  She has not had a rash since her initial diagnosis.  She is also complaining of right-sided shooting, intermittent sharp pain that started because she has to sleep on her right side.   On inspection, there are no rashes or vesicular  lesions present on the left or right side the L4-L5 dermatome.  Right leg is 4 out of 5 strength, straight leg test is positive on the right side as well.  On the left side, there is 5 out of 5 strength.   She denies any red flag symptoms such as weight loss, incontinence fevers, chills.   Plan: - Less likely that this is postherpetic neuralgia as timeline would not make sense.  Given new onset right-sided weakness and radiculopathic pain I believe imaging is warranted.  Will start with x-ray of lumbar spine - Referral to physical therapy - Instructed patient to take 600 mg of gabapentin at 3 times a day  PAIN:  Are you having pain? Yes: NPRS scale: 8/10 Pain location: R low back and thigh region Pain description: ache, burning Aggravating factors: activity Relieving factors: meds and rest/position changes  PRECAUTIONS: None  RED FLAGS: Bowel or bladder incontinence: No   WEIGHT BEARING RESTRICTIONS: No  FALLS:  Has patient fallen in last 6 months? No  OCCUPATION: not working  PLOF: Independent  PATIENT GOALS: To reduce and manage my back pain  NEXT MD VISIT: 1 week  OBJECTIVE:   DIAGNOSTIC FINDINGS:  Imaging studies pending  PATIENT SURVEYS:  FOTO 25(41 predicted)  MUSCLE LENGTH: Hamstrings: Right 80 deg; Left 80 deg  POSTURE: deferred   PALPATION: Deferred due to shingles pain  LUMBAR ROM:   AROM eval  Flexion   Extension   Right lateral flexion   Left lateral flexion   Right rotation   Left rotation    (Blank rows = not tested)  LOWER EXTREMITY ROM:   WFL   Active  Right eval Left eval  Hip flexion    Hip extension    Hip abduction    Hip adduction    Hip internal rotation    Hip external rotation    Knee flexion    Knee extension    Ankle dorsiflexion    Ankle plantarflexion    Ankle inversion    Ankle eversion     (Blank rows = not tested)  LOWER EXTREMITY MMT:    MMT Right eval Left eval  Hip flexion 4-   Hip extension 4-    Hip abduction 4-   Hip adduction    Hip internal rotation    Hip external rotation    Knee flexion    Knee extension    Ankle dorsiflexion    Ankle plantarflexion 4-   Ankle inversion    Ankle eversion     (Blank rows = not tested)  LUMBAR SPECIAL TESTS:  Straight leg raise test:  Negative and Slump test: Negative  FUNCTIONAL TESTS:  30 seconds chair stand test  GAIT: Distance walked: 27ft x2 Assistive device utilized: None Level of assistance: Complete Independence Comments: slower cadence  TODAY'S TREATMENT:     [facility] Adult PT Treatment:                                                DATE: 05/14/23 Therapeutic Exercise: Nustep L2  Seated hamstring stretch 30s x2 B Seated sciatic nerve glide 10x B SLR 15/15 rest breaks need on R PPT 3s hold 10x PPT with march 10/10 QL stretch 30s x2 B                                                                                                                         DATE: 05/08/23 Eval    PATIENT EDUCATION:  Education details: Discussed eval findings, rehab rationale and POC and patient is in agreement  Person educated: Patient Education method: Explanation Education comprehension: verbalized understanding and needs further education  HOME EXERCISE PROGRAM: Access Code: [email or web address] URL: [email or web address] Date: 05/08/2023 Prepared by: Gustavus Bryant  Exercises - Supine Bridge  - 2 x daily - 5 x weekly - 2 sets - 10 reps - Small Range Straight Leg Raise  - 2 x daily - 5 x weekly - 2 sets - 10 reps - Sit to Stand with Arms Crossed  - 2 x daily - 5 x weekly - 1 sets - 5 reps  ASSESSMENT:  CLINICAL IMPRESSION: Patient returns for first f/u session with no changes to note. Today's session focused on stretching, core and trunk strengthening, aerobic conditioning as well as HEP review.  Struggled with R SLR due to fatigue not weakness.  OBJECTIVE IMPAIRMENTS: decreased activity tolerance, decreased endurance,  decreased knowledge of condition, decreased mobility, difficulty walking, decreased ROM, decreased strength, and pain.   ACTIVITY LIMITATIONS: carrying, lifting, standing, and stairs  PERSONAL FACTORS: Behavior pattern, Past/current experiences, Time since onset of injury/illness/exacerbation, and 1 comorbidity: active shingles  are also affecting patient's functional outcome.   REHAB POTENTIAL: Good  CLINICAL DECISION MAKING: Stable/uncomplicated  EVALUATION COMPLEXITY: Low   GOALS: Goals reviewed with patient? No  SHORT TERM GOALS=LONG TERM GOALS: Target date: 06/19/2023  Patient to demonstrate independence in HEP  Baseline: [id number] Goal status: INITIAL  2.  Increase reps on 30 sec stand test to 10 w/UE support Baseline: 8 w/UE support Goal status: INITIAL  3.  Increase FOTO score to 41 Baseline: 25 Goal status: INITIAL  4.  Increase RLE strength to 4/5 Baseline:  MMT Right eval Left eval  Hip flexion 4-   Hip extension 4-   Hip abduction 4-    Goal status: INITIAL   PLAN:  PT FREQUENCY: 1-2x/week  PT DURATION: 6 weeks  PLANNED INTERVENTIONS: Therapeutic exercises, Therapeutic activity, Neuromuscular re-education, Balance training, Gait  training, Patient/Family education, Self Care, Joint mobilization, Stair training, Aquatic Therapy, Dry Needling, Electrical stimulation, Spinal mobilization, Cryotherapy, Moist heat, Manual therapy, and Re-evaluation.  PLAN FOR NEXT SESSION: HEP review and update, manual techniques as appropriate, aerobic tasks, ROM and flexibility activities, strengthening and PREs, TPDN, gait and balance training as needed    Check all possible CPT codes: 16109 - PT Re-evaluation, 97110- Therapeutic Exercise, [phone]- Neuro Re-education, [phone] - Gait Training, [phone] - Manual Therapy, 97530 - Therapeutic Activities, and [phone] - Self Care    Check all conditions that are expected to impact treatment: {Conditions expected to impact  treatment:Diabetes mellitus and Neurological condition and/or seizures   If treatment provided at initial evaluation, no treatment charged due to lack of authorization.       Hildred Laser, PT 05/28/2023, 9:20 AM

## 2023-05-29 ENCOUNTER — Ambulatory Visit: Payer: Medicaid Other

## 2023-06-02 NOTE — Therapy (Deleted)
OUTPATIENT PHYSICAL THERAPY TREATMENT NOTE   Patient Name: Vanessa Case MRN: [id number] DOB:July 13, 1971, 52 y.o., female Today's Date: 06/02/2023  END OF SESSION:     Past Medical History:  Diagnosis Date   Anemia    Anxiety    Arthritis    Cholelithiasis with acute cholecystitis 06/28/2013   Chronic abdominal pain    Chronic back pain    Chronic headache    Chronic neck pain    Coronary artery disease    Depression    Diabetes mellitus    adult onset dm-maintained on glipizide and metformin, pt states fasting glucose runs 110   Fibromyalgia    GERD (gastroesophageal reflux disease)    HLD (hyperlipidemia) 05/30/2015   Hyperlipidemia    Hypertension    maintained on lisinopril hct, metoprolol-134/86 at pat visit   Neuromuscular disorder (HCC)    Neuropathy    NSTEMI (non-ST elevated myocardial infarction) (HCC) 02/05/2020   Obesity    PTSD (post-traumatic stress disorder)    Right arm weakness 03/05/2013   Substance abuse (HCC)    Past Surgical History:  Procedure Laterality Date   ABDOMINAL HYSTERECTOMY  10/30/2011   Procedure: HYSTERECTOMY ABDOMINAL;  Surgeon: Bing Plume, MD;  Location: [facility];  Service: Gynecology;  Laterality: N/A;   CESAREAN SECTION  x3   CHOLECYSTECTOMY N/A 06/28/2013   Procedure: LAPAROSCOPIC CHOLECYSTECTOMY;  Surgeon: Shelly Rubenstein, MD;  Location: [facility];  Service: General;  Laterality: N/A;   CORONARY STENT INTERVENTION N/A 02/07/2020   Procedure: CORONARY STENT INTERVENTION;  Surgeon: Marykay Lex, MD;  Location: [facility] INVASIVE CV LAB;  Service: Cardiovascular;  Laterality: N/A;   LEFT HEART CATH AND CORONARY ANGIOGRAPHY N/A 02/07/2020   Procedure: LEFT HEART CATH AND CORONARY ANGIOGRAPHY;  Surgeon: Marykay Lex, MD;  Location: [facility] INVASIVE CV LAB;  Service: Cardiovascular;  Laterality: N/A;   SALPINGOOPHORECTOMY  10/30/2011   Procedure: SALPINGO OOPHERECTOMY;  Surgeon: Bing Plume, MD;  Location: [facility];  Service:  Gynecology;  Laterality: Right;   WRIST SURGERY     carpel tunnel and tendonitis   Patient Active Problem List   Diagnosis Date Noted   Lumbar radiculopathy 03/31/2023   Postherpetic neuralgia 12/24/2022   Substance abuse (HCC) 02/08/2020   NSTEMI (non-ST elevated myocardial infarction) (HCC) 02/05/2020   Major depressive disorder, recurrent, severe without psychotic features (HCC)    Type 2 diabetes mellitus with diabetic polyneuropathy (HCC) 05/30/2015   Essential hypertension 05/30/2015   Hyperlipidemia 05/30/2015   Bipolar 2 disorder, major depressive episode (HCC) 09/20/2014    PCP:  Olegario Messier, MD   REFERRING PROVIDER: Gust Rung, DO  REFERRING DIAG: M54.16 (ICD-10-CM) - Lumbar radiculopathy  Rationale for Evaluation and Treatment: Rehabilitation  THERAPY DIAG:  No diagnosis found.  ONSET DATE: chronic  SUBJECTIVE:  SUBJECTIVE STATEMENT: No changes since last session.  Still awaiting imaging studies/x-ray.  MD visit postponed until next week.  PERTINENT HISTORY:  Lumbar radiculopathy Patient was evaluated in clinic previously, with complaints of neuropathic pain in the L5 dermatome pattern on the left side.  She had been recently evaluated in the emergency department before then, was diagnosed with postherpetic neuralgia.  She was started on gabapentin 300 - 300 - 600, she states had helped the pain.  She was also started on valacyclovir which also helped with the pain.  Now, patient has recurrence of this pain, however does not have rash associated.  She has not had a rash since her initial diagnosis.  She is also complaining of right-sided shooting, intermittent sharp pain that started because she has to sleep on her right side.   On inspection, there are no rashes or vesicular  lesions present on the left or right side the L4-L5 dermatome.  Right leg is 4 out of 5 strength, straight leg test is positive on the right side as well.  On the left side, there is 5 out of 5 strength.   She denies any red flag symptoms such as weight loss, incontinence fevers, chills.   Plan: - Less likely that this is postherpetic neuralgia as timeline would not make sense.  Given new onset right-sided weakness and radiculopathic pain I believe imaging is warranted.  Will start with x-ray of lumbar spine - Referral to physical therapy - Instructed patient to take 600 mg of gabapentin at 3 times a day  PAIN:  Are you having pain? Yes: NPRS scale: 8/10 Pain location: R low back and thigh region Pain description: ache, burning Aggravating factors: activity Relieving factors: meds and rest/position changes  PRECAUTIONS: None  RED FLAGS: Bowel or bladder incontinence: No   WEIGHT BEARING RESTRICTIONS: No  FALLS:  Has patient fallen in last 6 months? No  OCCUPATION: not working  PLOF: Independent  PATIENT GOALS: To reduce and manage my back pain  NEXT MD VISIT: 1 week  OBJECTIVE:   DIAGNOSTIC FINDINGS:  Imaging studies pending  PATIENT SURVEYS:  FOTO 25(41 predicted)  MUSCLE LENGTH: Hamstrings: Right 80 deg; Left 80 deg  POSTURE: deferred   PALPATION: Deferred due to shingles pain  LUMBAR ROM:   AROM eval  Flexion   Extension   Right lateral flexion   Left lateral flexion   Right rotation   Left rotation    (Blank rows = not tested)  LOWER EXTREMITY ROM:   WFL   Active  Right eval Left eval  Hip flexion    Hip extension    Hip abduction    Hip adduction    Hip internal rotation    Hip external rotation    Knee flexion    Knee extension    Ankle dorsiflexion    Ankle plantarflexion    Ankle inversion    Ankle eversion     (Blank rows = not tested)  LOWER EXTREMITY MMT:    MMT Right eval Left eval  Hip flexion 4-   Hip extension 4-    Hip abduction 4-   Hip adduction    Hip internal rotation    Hip external rotation    Knee flexion    Knee extension    Ankle dorsiflexion    Ankle plantarflexion 4-   Ankle inversion    Ankle eversion     (Blank rows = not tested)  LUMBAR SPECIAL TESTS:  Straight leg raise test:  Negative and Slump test: Negative  FUNCTIONAL TESTS:  30 seconds chair stand test  GAIT: Distance walked: 63ft x2 Assistive device utilized: None Level of assistance: Complete Independence Comments: slower cadence  TODAY'S TREATMENT:     [facility] Adult PT Treatment:                                                DATE: 05/14/23 Therapeutic Exercise: Nustep L2  Seated hamstring stretch 30s x2 B Seated sciatic nerve glide 10x B SLR 15/15 rest breaks need on R PPT 3s hold 10x PPT with march 10/10 QL stretch 30s x2 B                                                                                                                         DATE: 05/08/23 Eval    PATIENT EDUCATION:  Education details: Discussed eval findings, rehab rationale and POC and patient is in agreement  Person educated: Patient Education method: Explanation Education comprehension: verbalized understanding and needs further education  HOME EXERCISE PROGRAM: Access Code: [email or web address] URL: [email or web address] Date: 05/08/2023 Prepared by: Gustavus Bryant  Exercises - Supine Bridge  - 2 x daily - 5 x weekly - 2 sets - 10 reps - Small Range Straight Leg Raise  - 2 x daily - 5 x weekly - 2 sets - 10 reps - Sit to Stand with Arms Crossed  - 2 x daily - 5 x weekly - 1 sets - 5 reps  ASSESSMENT:  CLINICAL IMPRESSION: Patient returns for first f/u session with no changes to note. Today's session focused on stretching, core and trunk strengthening, aerobic conditioning as well as HEP review.  Struggled with R SLR due to fatigue not weakness.  OBJECTIVE IMPAIRMENTS: decreased activity tolerance, decreased endurance,  decreased knowledge of condition, decreased mobility, difficulty walking, decreased ROM, decreased strength, and pain.   ACTIVITY LIMITATIONS: carrying, lifting, standing, and stairs  PERSONAL FACTORS: Behavior pattern, Past/current experiences, Time since onset of injury/illness/exacerbation, and 1 comorbidity: active shingles  are also affecting patient's functional outcome.   REHAB POTENTIAL: Good  CLINICAL DECISION MAKING: Stable/uncomplicated  EVALUATION COMPLEXITY: Low   GOALS: Goals reviewed with patient? No  SHORT TERM GOALS=LONG TERM GOALS: Target date: 06/19/2023  Patient to demonstrate independence in HEP  Baseline: [email or web address] Goal status: INITIAL  2.  Increase reps on 30 sec stand test to 10 w/UE support Baseline: 8 w/UE support Goal status: INITIAL  3.  Increase FOTO score to 41 Baseline: 25 Goal status: INITIAL  4.  Increase RLE strength to 4/5 Baseline:  MMT Right eval Left eval  Hip flexion 4-   Hip extension 4-   Hip abduction 4-    Goal status: INITIAL   PLAN:  PT FREQUENCY: 1-2x/week  PT DURATION: 6 weeks  PLANNED INTERVENTIONS: Therapeutic exercises, Therapeutic activity, Neuromuscular re-education, Balance training, Gait  training, Patient/Family education, Self Care, Joint mobilization, Stair training, Aquatic Therapy, Dry Needling, Electrical stimulation, Spinal mobilization, Cryotherapy, Moist heat, Manual therapy, and Re-evaluation.  PLAN FOR NEXT SESSION: HEP review and update, manual techniques as appropriate, aerobic tasks, ROM and flexibility activities, strengthening and PREs, TPDN, gait and balance training as needed    Check all possible CPT codes: 16109 - PT Re-evaluation, 97110- Therapeutic Exercise, [phone]- Neuro Re-education, [phone] - Gait Training, [phone] - Manual Therapy, 97530 - Therapeutic Activities, and [phone] - Self Care    Check all conditions that are expected to impact treatment: {Conditions expected to impact  treatment:Diabetes mellitus and Neurological condition and/or seizures   If treatment provided at initial evaluation, no treatment charged due to lack of authorization.       Hildred Laser, PT 06/02/2023, 12:03 PM

## 2023-06-04 ENCOUNTER — Ambulatory Visit (HOSPITAL_COMMUNITY): Payer: Self-pay | Admitting: Psychiatry

## 2023-06-04 ENCOUNTER — Ambulatory Visit: Payer: Medicaid Other

## 2023-06-04 ENCOUNTER — Ambulatory Visit: Payer: Medicaid Other | Attending: Cardiovascular Disease | Admitting: Cardiovascular Disease

## 2023-06-04 ENCOUNTER — Encounter: Payer: Self-pay | Admitting: Cardiovascular Disease

## 2023-06-04 VITALS — BP 149/75 | HR 90 | Ht 65.0 in | Wt 175.2 lb

## 2023-06-04 DIAGNOSIS — I10 Essential (primary) hypertension: Secondary | ICD-10-CM

## 2023-06-04 DIAGNOSIS — E785 Hyperlipidemia, unspecified: Secondary | ICD-10-CM

## 2023-06-04 DIAGNOSIS — I214 Non-ST elevation (NSTEMI) myocardial infarction: Secondary | ICD-10-CM

## 2023-06-04 MED ORDER — BLOOD PRESSURE CUFF MISC: 0 refills | Status: DC

## 2023-06-04 MED ORDER — IRBESARTAN 150 MG PO TABS: 150.0000 mg | ORAL_TABLET | Freq: Every day | ORAL | 3 refills | Status: DC

## 2023-06-04 NOTE — Patient Instructions (Addendum)
Medication Instructions:  Irbesartan 150 mg daily *If you need a refill on your cardiac medications before your next appointment, please call your pharmacy*   Lab Work: Lipid panel- Please return for Blood Work in this week. No appointment needed, lab here at the office is open Monday-Friday from 8AM to 4PM and closed daily for lunch from 12:45-1:45.   If you have labs (blood work) drawn today and your tests are completely normal, you will receive your results only by: MyChart Message (if you have MyChart) OR A paper copy in the mail If you have any lab test that is abnormal or we need to change your treatment, we will call you to review the results.   Testing/Procedures: Keep a log of your BP for 1-2 weeks and send in your readings.   Go to [facility]   [address] [place], [place] [address]   Follow-Up: At [facility], you and your health needs are our priority.  As part of our continuing mission to provide you with exceptional heart care, we have created designated Provider Care Teams.  These Care Teams include your primary Cardiologist (physician) and Advanced Practice Providers (APPs -  Physician Assistants and Nurse Practitioners) who all work together to provide you with the care you need, when you need it.  We recommend signing up for the patient portal called "MyChart".  Sign up information is provided on this After Visit Summary.  MyChart is used to connect with patients for Virtual Visits (Telemedicine).  Patients are able to view lab/test results, encounter notes, upcoming appointments, etc.  Non-urgent messages can be sent to your provider as well.   To learn more about what you can do with MyChart, go to [email or web address].    Your next appointment:    Follow up in 6 months  Provider:   Dr Royann Shivers
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
## 2023-06-06 NOTE — Therapy (Deleted)
OUTPATIENT PHYSICAL THERAPY TREATMENT NOTE   Patient Name: Vanessa Case MRN: [id number] DOB:[birth year], 52 y.o., female Today's Date: 06/06/2023  END OF SESSION:     Past Medical History:  Diagnosis Date   Anemia    Anxiety    Arthritis    Cholelithiasis with acute cholecystitis 06/28/2013   Chronic abdominal pain    Chronic back pain    Chronic headache    Chronic neck pain    Coronary artery disease    Depression    Diabetes mellitus    adult onset dm-maintained on glipizide and metformin, pt states fasting glucose runs 110   Fibromyalgia    GERD (gastroesophageal reflux disease)    HLD (hyperlipidemia) 05/30/2015   Hyperlipidemia    Hypertension    maintained on lisinopril hct, metoprolol-134/86 at pat visit   Neuromuscular disorder (HCC)    Neuropathy    NSTEMI (non-ST elevated myocardial infarction) (HCC) 02/05/2020   Obesity    PTSD (post-traumatic stress disorder)    Right arm weakness 03/05/2013   Substance abuse (HCC)    Past Surgical History:  Procedure Laterality Date   ABDOMINAL HYSTERECTOMY  10/30/2011   Procedure: HYSTERECTOMY ABDOMINAL;  Surgeon: Bing Plume, MD;  Location: [facility];  Service: Gynecology;  Laterality: N/A;   CESAREAN SECTION  x3   CHOLECYSTECTOMY N/A 06/28/2013   Procedure: LAPAROSCOPIC CHOLECYSTECTOMY;  Surgeon: Shelly Rubenstein, MD;  Location: [facility];  Service: General;  Laterality: N/A;   CORONARY STENT INTERVENTION N/A 02/07/2020   Procedure: CORONARY STENT INTERVENTION;  Surgeon: Marykay Lex, MD;  Location: [facility];  Service: Cardiovascular;  Laterality: N/A;   LEFT HEART CATH AND CORONARY ANGIOGRAPHY N/A 02/07/2020   Procedure: LEFT HEART CATH AND CORONARY ANGIOGRAPHY;  Surgeon: Marykay Lex, MD;  Location: [facility] CV [facility];  Service: Cardiovascular;  Laterality: N/A;   SALPINGOOPHORECTOMY  10/30/2011   Procedure: SALPINGO OOPHERECTOMY;  Surgeon: Bing Plume, MD;  Location: [facility];  Service:  Gynecology;  Laterality: Right;   WRIST SURGERY     carpel tunnel and tendonitis   Patient Active Problem List   Diagnosis Date Noted   Lumbar radiculopathy 03/31/2023   Postherpetic neuralgia 12/24/2022   Substance abuse (HCC) 02/08/2020   NSTEMI (non-ST elevated myocardial infarction) (HCC) 02/05/2020   Major depressive disorder, recurrent, severe without psychotic features (HCC)    Type 2 diabetes mellitus with diabetic polyneuropathy (HCC) 05/30/2015   Essential hypertension 05/30/2015   Hyperlipidemia 05/30/2015   Bipolar 2 disorder, major depressive episode (HCC) 09/20/2014    PCP:  Olegario Messier, MD   REFERRING PROVIDER: Gust Rung, DO  REFERRING DIAG: M54.16 (ICD-10-CM) - Lumbar radiculopathy  Rationale for Evaluation and Treatment: Rehabilitation  THERAPY DIAG:  No diagnosis found.  ONSET DATE: chronic  SUBJECTIVE:  SUBJECTIVE STATEMENT: No changes since last session.  Still awaiting imaging studies/x-ray.  MD visit postponed until next week.  PERTINENT HISTORY:  Lumbar radiculopathy Patient was evaluated in clinic previously, with complaints of neuropathic pain in the L5 dermatome pattern on the left side.  She had been recently evaluated in the emergency department before then, was diagnosed with postherpetic neuralgia.  She was started on gabapentin 300 - 300 - 600, she states had helped the pain.  She was also started on valacyclovir which also helped with the pain.  Now, patient has recurrence of this pain, however does not have rash associated.  She has not had a rash since her initial diagnosis.  She is also complaining of right-sided shooting, intermittent sharp pain that started because she has to sleep on her right side.   On inspection, there are no rashes or vesicular  lesions present on the left or right side the L4-L5 dermatome.  Right leg is 4 out of 5 strength, straight leg test is positive on the right side as well.  On the left side, there is 5 out of 5 strength.   She denies any red flag symptoms such as weight loss, incontinence fevers, chills.   Plan: - Less likely that this is postherpetic neuralgia as timeline would not make sense.  Given new onset right-sided weakness and radiculopathic pain I believe imaging is warranted.  Will start with x-ray of lumbar spine - Referral to physical therapy - Instructed patient to take 600 mg of gabapentin at 3 times a day  PAIN:  Are you having pain? Yes: NPRS scale: 8/10 Pain location: R low back and thigh region Pain description: ache, burning Aggravating factors: activity Relieving factors: meds and rest/position changes  PRECAUTIONS: None  RED FLAGS: Bowel or bladder incontinence: No   WEIGHT BEARING RESTRICTIONS: No  FALLS:  Has patient fallen in last 6 months? No  OCCUPATION: not working  PLOF: Independent  PATIENT GOALS: To reduce and manage my back pain  NEXT MD VISIT: 1 week  OBJECTIVE:   DIAGNOSTIC FINDINGS:  Imaging studies pending  PATIENT SURVEYS:  FOTO 25(41 predicted)  MUSCLE LENGTH: Hamstrings: Right 80 deg; Left 80 deg  POSTURE: deferred   PALPATION: Deferred due to shingles pain  LUMBAR ROM:   AROM eval  Flexion   Extension   Right lateral flexion   Left lateral flexion   Right rotation   Left rotation    (Blank rows = not tested)  LOWER EXTREMITY ROM:   WFL   Active  Right eval Left eval  Hip flexion    Hip extension    Hip abduction    Hip adduction    Hip internal rotation    Hip external rotation    Knee flexion    Knee extension    Ankle dorsiflexion    Ankle plantarflexion    Ankle inversion    Ankle eversion     (Blank rows = not tested)  LOWER EXTREMITY MMT:    MMT Right eval Left eval  Hip flexion 4-   Hip extension 4-    Hip abduction 4-   Hip adduction    Hip internal rotation    Hip external rotation    Knee flexion    Knee extension    Ankle dorsiflexion    Ankle plantarflexion 4-   Ankle inversion    Ankle eversion     (Blank rows = not tested)  LUMBAR SPECIAL TESTS:  Straight leg raise test:  Negative and Slump test: Negative  FUNCTIONAL TESTS:  30 seconds chair stand test  GAIT: Distance walked: 45ft x2 Assistive device utilized: None Level of assistance: Complete Independence Comments: slower cadence  TODAY'S TREATMENT:     [facility] Adult PT Treatment:                                                DATE: 05/14/23 Therapeutic Exercise: Nustep L2  Seated hamstring stretch 30s x2 B Seated sciatic nerve glide 10x B SLR 15/15 rest breaks need on R PPT 3s hold 10x PPT with march 10/10 QL stretch 30s x2 B                                                                                                                         DATE: 05/08/23 Eval    PATIENT EDUCATION:  Education details: Discussed eval findings, rehab rationale and POC and patient is in agreement  Person educated: Patient Education method: Explanation Education comprehension: verbalized understanding and needs further education  HOME EXERCISE PROGRAM: Access Code: [email or web address] URL: [email or web address] Date: 05/08/2023 Prepared by: Gustavus Bryant  Exercises - Supine Bridge  - 2 x daily - 5 x weekly - 2 sets - 10 reps - Small Range Straight Leg Raise  - 2 x daily - 5 x weekly - 2 sets - 10 reps - Sit to Stand with Arms Crossed  - 2 x daily - 5 x weekly - 1 sets - 5 reps  ASSESSMENT:  CLINICAL IMPRESSION: Patient returns for first f/u session with no changes to note. Today's session focused on stretching, core and trunk strengthening, aerobic conditioning as well as HEP review.  Struggled with R SLR due to fatigue not weakness.  OBJECTIVE IMPAIRMENTS: decreased activity tolerance, decreased endurance,  decreased knowledge of condition, decreased mobility, difficulty walking, decreased ROM, decreased strength, and pain.   ACTIVITY LIMITATIONS: carrying, lifting, standing, and stairs  PERSONAL FACTORS: Behavior pattern, Past/current experiences, Time since onset of injury/illness/exacerbation, and 1 comorbidity: active shingles  are also affecting patient's functional outcome.   REHAB POTENTIAL: Good  CLINICAL DECISION MAKING: Stable/uncomplicated  EVALUATION COMPLEXITY: Low   GOALS: Goals reviewed with patient? No  SHORT TERM GOALS=LONG TERM GOALS: Target date: 06/19/2023  Patient to demonstrate independence in HEP  Baseline: [email or web address] Goal status: INITIAL  2.  Increase reps on 30 sec stand test to 10 w/UE support Baseline: 8 w/UE support Goal status: INITIAL  3.  Increase FOTO score to 41 Baseline: 25 Goal status: INITIAL  4.  Increase RLE strength to 4/5 Baseline:  MMT Right eval Left eval  Hip flexion 4-   Hip extension 4-   Hip abduction 4-    Goal status: INITIAL   PLAN:  PT FREQUENCY: 1-2x/week  PT DURATION: 6 weeks  PLANNED INTERVENTIONS: Therapeutic exercises, Therapeutic activity, Neuromuscular re-education, Balance training, Gait  training, Patient/Family education, Self Care, Joint mobilization, Stair training, Aquatic Therapy, Dry Needling, Electrical stimulation, Spinal mobilization, Cryotherapy, Moist heat, Manual therapy, and Re-evaluation.  PLAN FOR NEXT SESSION: HEP review and update, manual techniques as appropriate, aerobic tasks, ROM and flexibility activities, strengthening and PREs, TPDN, gait and balance training as needed    Check all possible CPT codes: 53664 - PT Re-evaluation, 97110- Therapeutic Exercise, [phone]- Neuro Re-education, [phone] - Gait Training, [phone] - Manual Therapy, 97530 - Therapeutic Activities, and [phone] - Self Care    Check all conditions that are expected to impact treatment: {Conditions expected to impact  treatment:Diabetes mellitus and Neurological condition and/or seizures   If treatment provided at initial evaluation, no treatment charged due to lack of authorization.       Hildred Laser, PT 06/06/2023, 1:40 PM

## 2023-06-10 ENCOUNTER — Ambulatory Visit: Payer: Medicaid Other | Attending: Internal Medicine

## 2023-06-10 ENCOUNTER — Telehealth: Payer: Self-pay

## 2023-06-10 NOTE — Telephone Encounter (Signed)
TC due to missed visit.  VM left and informed patient no additional visits are scheduled.  Requested to call front office to schedule additional visits as needed.

## 2023-06-13 ENCOUNTER — Telehealth: Payer: Self-pay | Admitting: Cardiovascular Disease

## 2023-06-13 NOTE — Telephone Encounter (Signed)
Number was fax number, so recalled to correct number.  Advised of ICD 10 code I10.Spoke with one staff member who put me on hold afor pharmacist, then another staff member answers.  She states they do not do that, it needs to go to medical supply store.  Advised we do send them to pharmacy and based on insurance is what patient picks up.  I also advised that they had called [place] for the IC 10 code. She still will not take code and advised we would wait until they call back.

## 2023-06-13 NOTE — Telephone Encounter (Signed)
Pharmacy calling needs a diagnose code for BP cuff. Please advise

## 2023-06-14 LAB — LIPID PANEL
Chol/HDL Ratio: 3.2 ratio (ref 0.0–4.4)
Cholesterol, Total: 164 mg/dL (ref 100–199)
HDL: 51 mg/dL (ref >39)
LDL Chol Calc (NIH): 93 mg/dL (ref 0–99)
Triglycerides: 114 mg/dL (ref 0–149)
VLDL Cholesterol Cal: 20 mg/dL (ref 5–40)

## 2023-06-16 ENCOUNTER — Other Ambulatory Visit: Payer: Self-pay | Admitting: Emergency Medicine

## 2023-06-16 ENCOUNTER — Telehealth: Payer: Self-pay | Admitting: Emergency Medicine

## 2023-06-16 DIAGNOSIS — E785 Hyperlipidemia, unspecified: Secondary | ICD-10-CM

## 2023-06-16 MED ORDER — EZETIMIBE 10 MG PO TABS: 10.0000 mg | ORAL_TABLET | Freq: Every day | ORAL | 3 refills | Status: DC

## 2023-06-16 NOTE — Telephone Encounter (Signed)
Croitoru, Mihai, MD  Scheryl Marten, RN Despite maximum dose atorvastatin the LDL cholesterol is too high. Please add ezetimibe 10 mg daily and recheck lipids in 3 months.   Left message and call back number.  Called pt to go over the information above and to let her know that the prescription for Ezetimibe 10 mg daily was sent to her pharmacy. Also, she will need to come back in 3 months for FASTING lipid panel.   (Orders placed- under results management/reflex order today 06/16/23)

## 2023-06-16 NOTE — Telephone Encounter (Signed)
Pt returned call and we went over all instructions and results. She verbalized understanding.

## 2023-06-26 ENCOUNTER — Telehealth (HOSPITAL_COMMUNITY): Payer: Self-pay | Admitting: Clinical

## 2023-06-26 NOTE — Telephone Encounter (Signed)
PT confirmed appt for 06/27/23 at 11am. Sounded very excited ;-)

## 2023-06-27 ENCOUNTER — Encounter: Payer: Self-pay | Admitting: Pharmacist

## 2023-06-30 ENCOUNTER — Ambulatory Visit (HOSPITAL_COMMUNITY): Payer: Self-pay | Admitting: Clinical

## 2023-07-07 ENCOUNTER — Encounter: Payer: Medicaid Other | Admitting: Student

## 2023-07-11 ENCOUNTER — Telehealth: Payer: Self-pay | Admitting: Student

## 2023-07-11 NOTE — Telephone Encounter (Signed)
gabapentin (NEURONTIN) 300 MG capsule   [id number] - [place], [place] - [address] AT [facility] OF [address]
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
## 2023-08-13 ENCOUNTER — Encounter (HOSPITAL_COMMUNITY): Payer: Self-pay | Admitting: Psychiatry

## 2023-08-13 ENCOUNTER — Telehealth (HOSPITAL_COMMUNITY): Payer: Self-pay

## 2023-08-13 ENCOUNTER — Ambulatory Visit (INDEPENDENT_AMBULATORY_CARE_PROVIDER_SITE_OTHER): Payer: MEDICAID | Admitting: Psychiatry

## 2023-08-13 VITALS — BP 158/92 | HR 112 | Temp 98.6°F | Ht 65.0 in | Wt 174.0 lb

## 2023-08-13 DIAGNOSIS — F1220 Cannabis dependence, uncomplicated: Secondary | ICD-10-CM | POA: Diagnosis not present

## 2023-08-13 DIAGNOSIS — F332 Major depressive disorder, recurrent severe without psychotic features: Secondary | ICD-10-CM

## 2023-08-13 DIAGNOSIS — F4310 Post-traumatic stress disorder, unspecified: Secondary | ICD-10-CM | POA: Diagnosis not present

## 2023-08-13 DIAGNOSIS — F411 Generalized anxiety disorder: Secondary | ICD-10-CM | POA: Insufficient documentation

## 2023-08-13 MED ORDER — PRAZOSIN HCL 1 MG PO CAPS
1.0000 mg | ORAL_CAPSULE | Freq: Every day | ORAL | 1 refills | Status: AC
Start: 2023-08-13 — End: ?

## 2023-08-13 MED ORDER — FLUOXETINE HCL 20 MG PO CAPS: 60.0000 mg | ORAL_CAPSULE | Freq: Every day | ORAL | 1 refills | Status: AC

## 2023-08-13 MED ORDER — MIRTAZAPINE 15 MG PO TABS
15.0000 mg | ORAL_TABLET | Freq: Every day | ORAL | 1 refills | Status: DC
Start: 2023-08-13 — End: 2024-03-27

## 2023-08-13 MED ORDER — HYDROXYZINE HCL 25 MG PO TABS: 25.0000 mg | ORAL_TABLET | Freq: Every day | ORAL | 1 refills | Status: DC

## 2023-08-13 NOTE — Patient Instructions (Signed)
Take 60 mg Prozac for 3-5 days and you can keep taking trazodone during this time if needed  In 5 days, start Remeron 15 mg and stop trazodone.

## 2023-08-13 NOTE — Telephone Encounter (Signed)
Vanessa Case calls and leaves a message that she would like therapy.  She leaves her number as [phone].  This therapist returns the call at 04:16 pm and she answers the phone. Therapist confirms her identity by obtaining two verifiers. Therapist explains she got Vanessa Case's message and inquired as to what type of therapy she is needing. She explains she is having trouble trying to stop using marijuana.  She said the last time she used crack/cocaine was in April 2024.  Vanessa Case says she wants to be part of the group and  she saw online that [facility] offers this type of group. Therapist communicates she will offer an appointment for a CCA on 08-18-23 at 1:00 pm.  Vanessa Eisenmenger, MS, LMFT, LCAS

## 2023-08-14 NOTE — Addendum Note (Signed)
Addended by: Tia Masker on: 08/14/2023 04:33 PM   Modules accepted: Level of Service

## 2023-08-15 ENCOUNTER — Ambulatory Visit (INDEPENDENT_AMBULATORY_CARE_PROVIDER_SITE_OTHER): Payer: MEDICAID | Admitting: Student

## 2023-08-15 VITALS — BP 148/87 | HR 106 | Temp 98.2°F | Ht 65.0 in | Wt 174.3 lb

## 2023-08-15 DIAGNOSIS — M7501 Adhesive capsulitis of right shoulder: Secondary | ICD-10-CM

## 2023-08-15 DIAGNOSIS — B0229 Other postherpetic nervous system involvement: Secondary | ICD-10-CM | POA: Diagnosis not present

## 2023-08-15 DIAGNOSIS — F411 Generalized anxiety disorder: Secondary | ICD-10-CM

## 2023-08-15 DIAGNOSIS — E785 Hyperlipidemia, unspecified: Secondary | ICD-10-CM

## 2023-08-15 DIAGNOSIS — I10 Essential (primary) hypertension: Secondary | ICD-10-CM

## 2023-08-15 DIAGNOSIS — E1142 Type 2 diabetes mellitus with diabetic polyneuropathy: Secondary | ICD-10-CM | POA: Diagnosis not present

## 2023-08-15 DIAGNOSIS — M25511 Pain in right shoulder: Secondary | ICD-10-CM

## 2023-08-15 LAB — POCT GLYCOSYLATED HEMOGLOBIN (HGB A1C): Hemoglobin A1C: 8 % — AB (ref 4.0–5.6)

## 2023-08-15 LAB — GLUCOSE, CAPILLARY: Glucose-Capillary: 140 mg/dL — ABNORMAL HIGH (ref 70–99)

## 2023-08-15 MED ORDER — IRBESARTAN 300 MG PO TABS: 300.0000 mg | ORAL_TABLET | Freq: Every day | ORAL | 3 refills | Status: DC

## 2023-08-15 MED ORDER — METHOCARBAMOL 500 MG PO TABS: 500.0000 mg | ORAL_TABLET | Freq: Every day | ORAL | 0 refills | Status: DC

## 2023-08-15 MED ORDER — SEMAGLUTIDE (1 MG/DOSE) 4 MG/3ML ~~LOC~~ SOPN: 1.5000 mg | PEN_INJECTOR | SUBCUTANEOUS | 3 refills | Status: DC

## 2023-08-15 NOTE — Patient Instructions (Signed)
Thank you so much for coming to the clinic today!   For your blood pressure: I have increased your irbesartan to 300mg    For your diabetes, we have increased your ozempic to 1.5mg    For your shoulder pain, I have sent in a muscle relaxer and we will be getting imaging of it as well.   If you have any questions please feel free to the call the clinic at anytime at [phone]. It was a pleasure seeing you!  Best, Dr. Thomasene Ripple

## 2023-08-16 DIAGNOSIS — M75110 Incomplete rotator cuff tear or rupture of unspecified shoulder, not specified as traumatic: Secondary | ICD-10-CM | POA: Insufficient documentation

## 2023-08-16 DIAGNOSIS — M25511 Pain in right shoulder: Secondary | ICD-10-CM | POA: Insufficient documentation

## 2023-08-16 NOTE — Assessment & Plan Note (Signed)
Patient presents for follow-up regarding her type 2 diabetes.  Her last A1c 4 months ago was 9.1, it is now decreased to 8.0.  Her current regimen is Ozempic 1 mg and metformin at 1000 mg twice a day.  She states she has been working hard to eat better and also exercise.  Will increase her Ozempic to 1.5 mg, continue metformin.  Plan: - Increase Ozempic to 1.5 mg - Continue metformin at 1000 mg twice a day

## 2023-08-16 NOTE — Assessment & Plan Note (Signed)
Follows with psychiatry, Prozac 60mg , and hydroxyzine 25mg  TID PRN for anxiety. She states this regimen has been working for her.
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
## 2023-08-16 NOTE — Assessment & Plan Note (Signed)
Stable, no acute complaints at this time. On Gabapentin for this working effectively.

## 2023-08-16 NOTE — Assessment & Plan Note (Signed)
On Zetia, last lipid panel in August showed an LDL of 93.

## 2023-08-16 NOTE — Assessment & Plan Note (Addendum)
Patient states that she fell about 10 years ago, and damaged her rotator cuff.  In June of this year she fell again and landed right on her elbow.  She describes the pain as dull and aching in her elbow and also her shoulder.  She has tried ibuprofen and Tylenol but this is not helping.  On my exam, she has very limited mobility of the shoulder on flexion, extension, and external rotation.  It is concerning that she has very limited mobility of the shoulder, as it is very painful to even move her arm because of it.  She denies any fevers, chills, or systemic symptoms that would make me concerned for septic arthritis, and no erythema or warmth of the joints that I would expect with gout.  Will obtain MRI to further evaluate.  Plan: - MRI shoulder - Short course of Robaxin for pain control

## 2023-08-16 NOTE — Assessment & Plan Note (Signed)
Blood pressure elevated today in clinic at 147/94, on recheck 141/85.  She does follow with cardiology for history of NSTEMI, and they initiated her on irbesartan 150 mg.  Will increase to 300 mg as she is close to her goal and check BMP.

## 2023-08-17 LAB — BMP8+ANION GAP
Anion Gap: 15 mmol/L (ref 10.0–18.0)
BUN/Creatinine Ratio: 11 (ref 9–23)
BUN: 8 mg/dL (ref 6–24)
CO2: 20 mmol/L (ref 20–29)
Calcium: 10.4 mg/dL — ABNORMAL HIGH (ref 8.7–10.2)
Chloride: 104 mmol/L (ref 96–106)
Creatinine, Ser: 0.74 mg/dL (ref 0.57–1.00)
Glucose: 104 mg/dL — ABNORMAL HIGH (ref 70–99)
Potassium: 4.1 mmol/L (ref 3.5–5.2)
Sodium: 139 mmol/L (ref 134–144)
eGFR: 99 mL/min/{1.73_m2} (ref >59)

## 2023-08-18 ENCOUNTER — Ambulatory Visit (HOSPITAL_COMMUNITY): Payer: Self-pay

## 2023-08-18 ENCOUNTER — Ambulatory Visit (INDEPENDENT_AMBULATORY_CARE_PROVIDER_SITE_OTHER): Payer: MEDICAID

## 2023-08-18 DIAGNOSIS — F1220 Cannabis dependence, uncomplicated: Secondary | ICD-10-CM | POA: Diagnosis not present

## 2023-08-18 DIAGNOSIS — F1021 Alcohol dependence, in remission: Secondary | ICD-10-CM

## 2023-08-18 DIAGNOSIS — F4310 Post-traumatic stress disorder, unspecified: Secondary | ICD-10-CM | POA: Diagnosis not present

## 2023-08-18 DIAGNOSIS — F1490 Cocaine use, unspecified, uncomplicated: Secondary | ICD-10-CM

## 2023-08-18 DIAGNOSIS — F1421 Cocaine dependence, in remission: Secondary | ICD-10-CM | POA: Diagnosis not present

## 2023-08-18 DIAGNOSIS — F332 Major depressive disorder, recurrent severe without psychotic features: Secondary | ICD-10-CM | POA: Diagnosis not present
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
## 2023-08-18 NOTE — Progress Notes (Signed)
Internal Medicine Clinic Attending  Case discussed with the resident at the time of the visit.  We reviewed the resident's history and exam and pertinent patient test results.  I agree with the assessment, diagnosis, and plan of care documented in the resident's note.  

## 2023-08-20 ENCOUNTER — Ambulatory Visit (INDEPENDENT_AMBULATORY_CARE_PROVIDER_SITE_OTHER): Payer: MEDICAID

## 2023-08-20 DIAGNOSIS — F1220 Cannabis dependence, uncomplicated: Secondary | ICD-10-CM

## 2023-08-20 DIAGNOSIS — F332 Major depressive disorder, recurrent severe without psychotic features: Secondary | ICD-10-CM

## 2023-08-20 DIAGNOSIS — F4310 Post-traumatic stress disorder, unspecified: Secondary | ICD-10-CM

## 2023-08-20 DIAGNOSIS — F1421 Cocaine dependence, in remission: Secondary | ICD-10-CM

## 2023-08-20 DIAGNOSIS — F1021 Alcohol dependence, in remission: Secondary | ICD-10-CM
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
## 2023-08-21 ENCOUNTER — Telehealth: Payer: Self-pay

## 2023-08-21 NOTE — Telephone Encounter (Signed)
Prior Authorization for patient (Ozempic 1 MG) came through on cover my meds was submitted with last office notes and labs awaiting approval or denial.  KEY:[id number]

## 2023-08-22 ENCOUNTER — Ambulatory Visit (INDEPENDENT_AMBULATORY_CARE_PROVIDER_SITE_OTHER): Payer: MEDICAID

## 2023-08-22 DIAGNOSIS — F1220 Cannabis dependence, uncomplicated: Secondary | ICD-10-CM

## 2023-08-22 DIAGNOSIS — F1421 Cocaine dependence, in remission: Secondary | ICD-10-CM

## 2023-08-22 DIAGNOSIS — F332 Major depressive disorder, recurrent severe without psychotic features: Secondary | ICD-10-CM

## 2023-08-22 DIAGNOSIS — F1021 Alcohol dependence, in remission: Secondary | ICD-10-CM

## 2023-08-22 DIAGNOSIS — F4310 Post-traumatic stress disorder, unspecified: Secondary | ICD-10-CM
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
## 2023-08-22 NOTE — Telephone Encounter (Signed)
Decision:Approved  Justeen Trudie Reed (Key: [id number]) Rx #: [phone] Ozempic (1 MG/DOSE) 4MG Ronny Bacon pen-injectors Form [employer] Medicaid Electronic Prior Authorization Form Message from Plan Approved. OZEMPIC (1 MG/DOSE) 4MG /3ML Soln Pen-inj is approved from 08/21/2023 to 08/20/2024. All strengths of the drug are approved.. Authorization Expiration Date: August 20, 2024.

## 2023-08-25 ENCOUNTER — Telehealth (HOSPITAL_COMMUNITY): Payer: Self-pay | Admitting: Licensed Clinical Social Worker

## 2023-08-25 ENCOUNTER — Ambulatory Visit (HOSPITAL_COMMUNITY): Payer: MEDICAID

## 2023-08-25 ENCOUNTER — Encounter (HOSPITAL_COMMUNITY): Payer: Self-pay

## 2023-08-25 NOTE — Telephone Encounter (Signed)
The therapist calls Vanessa Case confirming her identity via two identifiers. She says that she was not in group due to a toothache.   She says that her dentist cannot do anything about it until January and that she plans on being back in group on Wednesday.  Myrna Blazer, MA, LCSW, [facility], LCAS 08/25/2023

## 2023-08-27 ENCOUNTER — Telehealth (HOSPITAL_COMMUNITY): Payer: Self-pay | Admitting: Licensed Clinical Social Worker

## 2023-08-27 ENCOUNTER — Ambulatory Visit (INDEPENDENT_AMBULATORY_CARE_PROVIDER_SITE_OTHER): Payer: MEDICAID

## 2023-08-27 ENCOUNTER — Other Ambulatory Visit (HOSPITAL_COMMUNITY): Payer: Self-pay | Admitting: Medical

## 2023-08-27 DIAGNOSIS — F1490 Cocaine use, unspecified, uncomplicated: Secondary | ICD-10-CM

## 2023-08-27 DIAGNOSIS — F4310 Post-traumatic stress disorder, unspecified: Secondary | ICD-10-CM

## 2023-08-27 DIAGNOSIS — F1220 Cannabis dependence, uncomplicated: Secondary | ICD-10-CM | POA: Diagnosis not present

## 2023-08-27 DIAGNOSIS — E1142 Type 2 diabetes mellitus with diabetic polyneuropathy: Secondary | ICD-10-CM

## 2023-08-27 DIAGNOSIS — F332 Major depressive disorder, recurrent severe without psychotic features: Secondary | ICD-10-CM

## 2023-08-27 MED ORDER — QUETIAPINE FUMARATE 50 MG PO TABS: 50.0000 mg | ORAL_TABLET | Freq: Every day | ORAL | 0 refills | Status: AC

## 2023-08-27 MED ORDER — GABAPENTIN 300 MG PO CAPS: 600.0000 mg | ORAL_CAPSULE | Freq: Three times a day (TID) | ORAL | 2 refills | Status: DC

## 2023-08-27 NOTE — Progress Notes (Signed)
Medications reviewed Initial assessment 11/18
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
## 2023-08-27 NOTE — Telephone Encounter (Signed)
The therapist calls Vanessa Case confirming her identity via two identifiers. He informs her that her psychiatrist has requested that she get a new EKG done before starting the Seroquel as she had an abnormal EKG back in August which could have been related to the Seroquel.  [name] says that her Cardiologist attributed the abnormal EKG to the Brilinta she was taking which was stopped in September; however, she says that she will comply and get a new EKG done.  She calls back informing the therapist that she has a 10:30 a.m. appointment tomorrow to get the EKG done with her Internist.  Myrna Blazer, MA, LCSW, [facility], LCAS 08/27/2023

## 2023-08-28 ENCOUNTER — Encounter: Payer: MEDICAID | Admitting: Internal Medicine

## 2023-08-29 ENCOUNTER — Ambulatory Visit (INDEPENDENT_AMBULATORY_CARE_PROVIDER_SITE_OTHER): Payer: MEDICAID

## 2023-08-29 DIAGNOSIS — F1021 Alcohol dependence, in remission: Secondary | ICD-10-CM

## 2023-08-29 DIAGNOSIS — F332 Major depressive disorder, recurrent severe without psychotic features: Secondary | ICD-10-CM

## 2023-08-29 DIAGNOSIS — F1220 Cannabis dependence, uncomplicated: Secondary | ICD-10-CM | POA: Diagnosis not present

## 2023-08-29 DIAGNOSIS — F1421 Cocaine dependence, in remission: Secondary | ICD-10-CM

## 2023-08-29 DIAGNOSIS — F4310 Post-traumatic stress disorder, unspecified: Secondary | ICD-10-CM
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
## 2023-09-01 ENCOUNTER — Other Ambulatory Visit: Payer: Self-pay | Admitting: Student

## 2023-09-01 ENCOUNTER — Telehealth: Payer: Self-pay

## 2023-09-01 ENCOUNTER — Ambulatory Visit (HOSPITAL_COMMUNITY): Payer: MEDICAID

## 2023-09-01 ENCOUNTER — Telehealth (HOSPITAL_COMMUNITY): Payer: Self-pay | Admitting: Licensed Clinical Social Worker

## 2023-09-01 ENCOUNTER — Encounter (HOSPITAL_COMMUNITY): Payer: Self-pay

## 2023-09-01 DIAGNOSIS — E1142 Type 2 diabetes mellitus with diabetic polyneuropathy: Secondary | ICD-10-CM

## 2023-09-01 MED ORDER — SEMAGLUTIDE (1 MG/DOSE) 4 MG/3ML ~~LOC~~ SOPN: 1.0000 mg | PEN_INJECTOR | SUBCUTANEOUS | 3 refills | Status: DC

## 2023-09-01 NOTE — Telephone Encounter (Signed)
Vanessa Case leaves a voicemail saying that she will not be in group today; however, gives no reason for not attending.   Myrna Blazer, MA, LCSW, [facility], LCAS 09/01/2023

## 2023-09-01 NOTE — Telephone Encounter (Signed)
Yes 1mg  is fine!

## 2023-09-01 NOTE — Telephone Encounter (Signed)
Can you please send a new rx to the pharmacy and verify the instructions.

## 2023-09-01 NOTE — Telephone Encounter (Signed)
Received a fax from the pharmacy regarding a rx for ozempic, the pharmacy is asking for specific directions per pharmacists "Please verify ozempic dose,did you mean 1 mg weekly?, preset dosing can't do 1.5 mg."

## 2023-09-03 ENCOUNTER — Other Ambulatory Visit (HOSPITAL_COMMUNITY): Payer: Self-pay | Admitting: Medical

## 2023-09-03 ENCOUNTER — Ambulatory Visit (INDEPENDENT_AMBULATORY_CARE_PROVIDER_SITE_OTHER): Payer: MEDICAID | Admitting: Licensed Clinical Social Worker

## 2023-09-03 DIAGNOSIS — F1021 Alcohol dependence, in remission: Secondary | ICD-10-CM

## 2023-09-03 DIAGNOSIS — F1421 Cocaine dependence, in remission: Secondary | ICD-10-CM

## 2023-09-03 DIAGNOSIS — F4310 Post-traumatic stress disorder, unspecified: Secondary | ICD-10-CM

## 2023-09-03 DIAGNOSIS — F332 Major depressive disorder, recurrent severe without psychotic features: Secondary | ICD-10-CM

## 2023-09-03 DIAGNOSIS — F1220 Cannabis dependence, uncomplicated: Secondary | ICD-10-CM

## 2023-09-03 NOTE — Progress Notes (Signed)
Daily Group Progress Note   Program: CD IOP     Group Time: 9:00  am-12 pm      Type of Therapy: Process and Psychoeducational    Topic: The therapists check in with group members, assess for SI/HI/psychosis and overall level of functioning. The therapists inquire about sobriety date and number of community support meetings attended since last session.    The therapists continue to present information on self-esteem discussing how people can sometimes internalize negative messages that they received from their family of origin during childhood.  The therapists observe that people must examine beliefs and assumptions that they inherited from their family of origin and do what the 12-step program recommends in terms of taking what they need but leaving the rest.  The therapists pointed out that irrational thinking can be changed by avoiding people who continue to indoctrinate them with irrational thinking, associate with people who are supportive and perpetuate rational thinking, and by disputing this irrational self talk via a series of questions.  The therapists provide group members with a handout on techniques for disputing irrational beliefs which they are encouraged to review and to be prepared to discuss at the next group meeting.  The therapists explain the role that attending 12-step meetings plays in terms of helping people to maintain rational self talk and thus better self-esteem and in turn leading to a reduction in relapse.   Summary: Vanessa Case presents today rating her depression and anxiety both as a "6."  She describes her mood as being "grateful" and "thankful."  During the discussion of peoples family of origins, Vanessa Case recounts that her mother had paranoid schizophrenia and once left when [name] wrote on the school report that she wanted to be a Careers adviser.  Her mother told Vanessa Case that she was stupid and dumb so Vanessa Case then decided that she was going to act stupid and dumb.  She says that  it was not until she was in prison as she realized that intelligent people such as herself could go to present also.  Vanessa Case says that she has concluded that in order to improve her mood and her self-esteem that she cannot be around her family.  She says that she recently received a check $425 and is proud that she did not take this money to get high.  She has attended meetings; however, she has yet to get a sponsor.   Progress Towards Goals: No change in sobriety date   UDS collected: No  Results: Yes, positive for [facility]   AA/NA attended?: Yes   Sponsor?: No    [name], MA, [place], [facility], LCAS Remigio Eisenmenger, MS, LMFT, LCAS 09/03/2023

## 2023-09-05 ENCOUNTER — Ambulatory Visit (INDEPENDENT_AMBULATORY_CARE_PROVIDER_SITE_OTHER): Payer: MEDICAID

## 2023-09-05 DIAGNOSIS — F411 Generalized anxiety disorder: Secondary | ICD-10-CM

## 2023-09-05 DIAGNOSIS — F332 Major depressive disorder, recurrent severe without psychotic features: Secondary | ICD-10-CM

## 2023-09-05 DIAGNOSIS — F1220 Cannabis dependence, uncomplicated: Secondary | ICD-10-CM | POA: Diagnosis not present

## 2023-09-05 DIAGNOSIS — F1421 Cocaine dependence, in remission: Secondary | ICD-10-CM

## 2023-09-05 NOTE — Addendum Note (Signed)
Addended by: Verlan Friends on: 09/05/2023 05:41 PM   Modules accepted: Orders

## 2023-09-05 NOTE — Progress Notes (Addendum)
   Daily Group Progress Note   Program: CD IOP     Group Time: 9:00  am-12:00 pm     Type of Therapy: Process and Psychoeducational    Topic: The therapists check in with group members, assess for SI/HI/psychosis and overall level of functioning. The therapists inquire about sobriety date and number of community support meetings attended since last session.     Therapists discussed the importance of taking time to evaluate where persons are  in the recovery process, so that therapists would know issues to focus on so that members will know what they need to work on to further make progress on goals.   Therapists passed out the recovery checklist from the [facility] that has a list of issues that are important to address in the recovery process. Asked group members to check off the issues that feel they have dealt with and which ones they need to address,  Summary: Vanessa Case rates her depression as a "7" and her anxiety as a "7". She identifies  her emotions as "gratitude and appreciation" Vanessa Case reports the same sobriety date.  She says she is attending meetings but does not have a sponsor.  Vanessa Case responds to the activity of addressing the recovery list by discussing the progress she feels she has thus far made. She notes she has been able to stay away from some triggers, particularly her family. She also notes how difficult it is not having her mother in her life and how she wishes things could be different.  Vanessa Case says she feels like a different person since starting SAIOP and having the commitment to her sobriety.  She says she looks forward to coming to learn and have support in her recovery. Vanessa Case says she wants to do the things that promote recovery. Vanessa Case notes that  having a car was a trigger for her and how she does not have a car. She says she sometimes drives her son's car on limited basis.     Progress Towards Goals:  same sobriety date    UDS collected: no  Results: None    AA/NA attended?: yes   Sponsor?: No   Remigio Eisenmenger, MS, LMFT, LCAS [address], [place], [place], [facility], LCAS 09-06-23

## 2023-09-08 ENCOUNTER — Ambulatory Visit (INDEPENDENT_AMBULATORY_CARE_PROVIDER_SITE_OTHER): Payer: MEDICAID

## 2023-09-08 DIAGNOSIS — F1220 Cannabis dependence, uncomplicated: Secondary | ICD-10-CM | POA: Diagnosis not present

## 2023-09-08 DIAGNOSIS — F4310 Post-traumatic stress disorder, unspecified: Secondary | ICD-10-CM

## 2023-09-08 DIAGNOSIS — F1021 Alcohol dependence, in remission: Secondary | ICD-10-CM

## 2023-09-08 DIAGNOSIS — F1490 Cocaine use, unspecified, uncomplicated: Secondary | ICD-10-CM

## 2023-09-08 NOTE — Telephone Encounter (Signed)
Pending review of EKG

## 2023-09-08 NOTE — Progress Notes (Signed)
   Daily Group Progress Note   Program: CD IOP     Group Time: 9:00  am-12:00 pm     Type of Therapy: Process and Psychoeducational    Topic: The therapists check in with group members, assess for SI/HI/psychosis and overall level of functioning. The therapists inquire about sobriety date and number of community support meetings attended since last session.    The therapists introduce the new group member today. The therapists discuss the following issues today: The first three of the Five Common Challenges in Early Recovery per The [employer] Model. The first common challenge is friends and associates who use and the important of finding new friends and associates who do not use. One may start with these approaches: attending 12 step or mutual support groups, participate in activities or hobbies that will increase changes of meeting abstinent people.  The second common challenge is emotions. Reminding group members that healing brain chemistry which involved dealing with strong, unpredictable emotions. The third common challenge in early recovery is dealing with substances in the home. Therapists prompt group members to discuss struggles with family members who may still be using and how to go through one's home removing drugs from hiding places.   Therapist discuss awareness as being necessary to effect change in recovery. Therapist discuss the characteristics of addiction, including addiction being cunning, smart, chronic, progressive and a liar to the person suffering from the disease.  Therapist discuss the brain chemistry of addiction.    Summary: Sheri rates her depression as a "3" and her anxiety as a "3".  She identifies her emotions as "hopeful and good".  Lorilee reports she is attending meetings on line but does not have a sponsor. She says she plans to get a sponsor. Reisa shares she has a new sobriety date and that is 09-08-23 (today. Sherrishares she last used crack/cocaine on  09-04-23.  Roxanna shares the her birthday is Christmas Eve and she has previously used this as an excuse to get high. Lakethia says that last year she decided to change things up and she got a duck to cook for Christmas.  Karolynn speaks of using as having the devil on one shoulder and goodness on the other.  Therapist discusses addiction as a brain disease, rather than an issue of good and evil.  In response to the second step in Common Challenges of Early Recovery, Cordelia Pen says she did not deal with emotions the last time she was in early recovery, rather she relapsed to avoid the emotions.   Progress Towards Goals:  reports using THC on 09-07-23 so new sobriety date is today, 09-08-23.   UDS collected: no  Results: None   AA/NA attended?: yes   Sponsor?: No   Remigio Eisenmenger, MS, LMFT, LCAS [address] [place], [place], [facility], LCAS 09-08-23

## 2023-09-10 ENCOUNTER — Encounter (HOSPITAL_COMMUNITY): Payer: Self-pay

## 2023-09-10 ENCOUNTER — Ambulatory Visit (HOSPITAL_COMMUNITY): Payer: MEDICAID

## 2023-09-10 ENCOUNTER — Telehealth (HOSPITAL_COMMUNITY): Payer: Self-pay | Admitting: Licensed Clinical Social Worker

## 2023-09-10 NOTE — Telephone Encounter (Signed)
The therapist attempts to reach [facility] when she no show/no calls for IOP today. He leaves a Architect.  Myrna Blazer, MA, LCSW, [facility], LCAS 09/10/2023

## 2023-09-12 ENCOUNTER — Encounter (HOSPITAL_COMMUNITY): Payer: Self-pay

## 2023-09-12 ENCOUNTER — Telehealth (HOSPITAL_COMMUNITY): Payer: Self-pay | Admitting: Licensed Clinical Social Worker

## 2023-09-12 ENCOUNTER — Ambulatory Visit (HOSPITAL_COMMUNITY): Payer: MEDICAID

## 2023-09-12 NOTE — Telephone Encounter (Signed)
The therapist again attempts to reach [facility] leaving another HIPAA compliant voicemail.  Myrna Blazer, MA, LCSW, [facility], LCAS 09/12/2023

## 2023-09-15 ENCOUNTER — Encounter (HOSPITAL_COMMUNITY): Payer: Self-pay | Admitting: Licensed Clinical Social Worker

## 2023-09-15 ENCOUNTER — Encounter (HOSPITAL_COMMUNITY): Payer: Self-pay

## 2023-09-15 ENCOUNTER — Ambulatory Visit (HOSPITAL_COMMUNITY): Payer: MEDICAID
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
## 2023-09-17 ENCOUNTER — Encounter (HOSPITAL_COMMUNITY): Payer: MEDICAID | Admitting: Psychiatry

## 2023-09-17 ENCOUNTER — Ambulatory Visit (HOSPITAL_COMMUNITY): Payer: MEDICAID

## 2023-09-19 ENCOUNTER — Ambulatory Visit (HOSPITAL_COMMUNITY): Payer: MEDICAID

## 2023-09-22 ENCOUNTER — Other Ambulatory Visit: Payer: Self-pay

## 2023-09-22 ENCOUNTER — Ambulatory Visit (HOSPITAL_COMMUNITY): Payer: MEDICAID

## 2023-09-22 DIAGNOSIS — E1142 Type 2 diabetes mellitus with diabetic polyneuropathy: Secondary | ICD-10-CM

## 2023-09-22 MED ORDER — METFORMIN HCL 1000 MG PO TABS: 1000.0000 mg | ORAL_TABLET | Freq: Two times a day (BID) | ORAL | 3 refills | Status: DC

## 2023-09-29 ENCOUNTER — Ambulatory Visit (HOSPITAL_COMMUNITY)
Admission: RE | Admit: 2023-09-29 | Discharge: 2023-09-29 | Disposition: A | Discharge status: Home / Self Care | Payer: MEDICAID | Source: Ambulatory Visit | Attending: Internal Medicine | Admitting: Internal Medicine

## 2023-09-29 DIAGNOSIS — M7501 Adhesive capsulitis of right shoulder: Secondary | ICD-10-CM | POA: Insufficient documentation

## 2023-09-30 ENCOUNTER — Telehealth: Payer: Self-pay | Admitting: *Deleted

## 2023-09-30 NOTE — Telephone Encounter (Signed)
From: Vanessa Case  Sent: 09/29/2023   7:04 PM EST  To: Imp Front Desk Pool  Subject: Appointment Request                              Appointment Request From: Vanessa Case    With Provider: [facility] Of [place]. [facility]]    Preferred Date Range: 10/07/2023 - 10/10/2023    Preferred Times: Any Time    Reason for visit: Office Visit    Comments:  R- shoulder pain R-elbow pain With arm weakness,numbnees,pain Unable to sleep due to R-arm pain, unable to reach behind car seat,have to put shirts and coat on the bad side first; do to pain, unable to wash dishes,put dishes up in cabinet,unable to use my right arm in shower, unable to grip or hold items in right hand, unable to sleep good at night even when taking mental health meds, affecting my shingles pain,it's up to an eight instead of the normal three; ADL's is bearly %50; limited use

## 2023-09-30 NOTE — Telephone Encounter (Signed)
I called pt to see if she could come in today for an appt - stated she has appts all week along with her daughter. Requesting an appt next week. Stated she had a MRI yesterday. C/o right arm weakness, pain, and numbness. Requesting something for pain. Appt schedule Friday 12/20 @ 0945AM.

## 2023-10-06 ENCOUNTER — Encounter: Payer: Self-pay | Admitting: Medical

## 2023-10-09 NOTE — Progress Notes (Unsigned)
CC: right arm weakness and numbness  HPI:  [name] is a 52 y.o. with medical history of HTN, HLD, DMII,  presenting to [facility] for right arm weakness and numbness.   Please see problem-based list for further details, assessments, and plans.  Past Medical History:  Diagnosis Date   Anemia    Anxiety    Arthritis    Cholelithiasis with acute cholecystitis 06/28/2013   Chronic abdominal pain    Chronic back pain    Chronic headache    Chronic neck pain    Coronary artery disease    Depression    Diabetes mellitus    adult onset dm-maintained on glipizide and metformin, pt states fasting glucose runs 110   Fibromyalgia    GERD (gastroesophageal reflux disease)    HLD (hyperlipidemia) 05/30/2015   Hyperlipidemia    Hypertension    maintained on lisinopril hct, metoprolol-134/86 at pat visit   Neuromuscular disorder (HCC)    Neuropathy    NSTEMI (non-ST elevated myocardial infarction) (HCC) 02/05/2020   Obesity    PTSD (post-traumatic stress disorder)    Right arm weakness 03/05/2013   Substance abuse (HCC)     Current Outpatient Medications (Endocrine & Metabolic):    metFORMIN (GLUCOPHAGE) 1000 MG tablet, Take 1 tablet (1,000 mg total) by mouth 2 (two) times daily with a meal.   Semaglutide, 1 MG/DOSE, 4 MG/3ML SOPN, Inject 1 mg into the skin once a week.  Current Outpatient Medications (Cardiovascular):    atorvastatin (LIPITOR) 80 MG tablet, Take 1 tablet (80 mg total) by mouth daily.   ezetimibe (ZETIA) 10 MG tablet, Take 1 tablet (10 mg total) by mouth daily.   irbesartan (AVAPRO) 300 MG tablet, Take 1 tablet (300 mg total) by mouth daily.   prazosin (MINIPRESS) 1 MG capsule, Take 1 capsule (1 mg total) by mouth at bedtime. To prevent nightmares  Current Outpatient Medications (Respiratory):    fluticasone (FLOVENT HFA) 44 MCG/ACT inhaler, Inhale 2 puffs into the lungs 2 (two) times daily as needed (for shortness of breath).  Current Outpatient  Medications (Analgesics):    aspirin EC 81 MG EC tablet, Take 1 tablet (81 mg total) by mouth daily.   Ibuprofen (ADVIL PO), Take 2 capsules by mouth as needed.   IBUPROFEN PO, Take 2 capsules by mouth as needed.   Current Outpatient Medications (Other):    Blood Pressure Monitoring (BLOOD PRESSURE CUFF) MISC, Blood pressure cuff   FLUoxetine (PROZAC) 20 MG capsule, Take 3 capsules (60 mg total) by mouth daily.   gabapentin (NEURONTIN) 300 MG capsule, Take 2 capsules (600 mg total) by mouth 3 (three) times daily. TAKE 2 CAPSULE BY MOUTH IN THE MORNING, 2 CAPSULE IN THE EVENING, AND 2 CAPSULES AT NIGHT   hydrOXYzine (ATARAX) 25 MG tablet, Take 1 tablet (25 mg total) by mouth daily.   methocarbamol (ROBAXIN) 500 MG tablet, Take 1 tablet (500 mg total) by mouth daily.   mirtazapine (REMERON) 15 MG tablet, Take 1 tablet (15 mg total) by mouth at bedtime.   Multiple Vitamins-Calcium (ONE-A-DAY WOMENS FORMULA PO), Take 1 tablet by mouth daily. (Patient not taking: Reported on 06/04/2023)   QUEtiapine (SEROQUEL) 50 MG tablet, Take 1 tablet (50 mg total) by mouth at bedtime.  Review of Systems:  Review of system negative unless stated in the problem list or HPI.    Physical Exam:  Vitals:   10/10/23 0934 10/10/23 0940  BP: (!) 165/99 (!) 157/78  Pulse: (!) 109 96  Temp:  98.6 F (37 C)  TempSrc: Oral Oral  Weight: 184 lb 8 oz (83.7 kg)   Height: 5\' 5"  (1.651 m)    Physical Exam General: NAD HENT: NCAT Lungs: CTAB, no wheeze, rhonchi or rales.  Cardiovascular: Normal heart sounds, no r/m/g, 2+ pulses in all extremities. No LE edema Abdomen: No TTP, normal bowel sounds MSK: No asymmetry or muscle atrophy. Right shoulder without erythema or swelling, pt with TTP of right shoulder and right elbow. Pain with passive range of motion limiting the motion. Specialty shoulder testing deferred given MRI results.  Skin: no lesions noted on exposed skin Neuro: Alert and oriented x4. CN grossly  intact Psych: Normal mood and normal affect   Assessment & Plan:   Essential hypertension Pt blood pressure elevated today but pt is significant pain from her right shoulder. Will continue current regimen until pain is better controlled.   Partial thickness rotator cuff tear Patient presented for a follow up on right arm pain/weakness. She states this has been going on for a couple of months. She had MRI done recently of right shoulder that shows mild supraspinatus and infraspinatus tendinosis. Mild subscapularis tendinosis with partial thickness tear. She states conservative measures are not helping and she needs something strong for pain. Discussed multiple treatment options. Given pt's exam of limited range of motion, with concern of developing adhesive capsulitis, steroid injection was chosen. Pt tolerated this well and noted marked improvement in her ROM and her pain. Will have pt take 3000 g every 6 hours, gabapentin 600 TID and use thermal therapy. Advised her to avoid NSAIDs given her CAD s/p stent placement. Will refer her to orthopedic surgery and physical therapy. She will likely respond to non-operative management.    See Encounters Tab for problem based charting.  Patient Discussed with Dr. Karrie Meres, MD [name]. [facility] Internal Medicine Residency, PGY-3     Procedure: Steroid injection of right shoulder Indication: rotator cuff tear Performing provider: Gwenevere Abbot, MD Supervising Physician: Dr. Mikey Bussing Consent obtained.  Procedure: The lateral side of shoulder was palpated and best area for injection was identified. The area was prepped in the usual sterile manner. Then a 25 g needle was inserted into the marked insertion point and advanced to a depth of ~3cm. Then a combination of 2cc's of 1% lidocaine and 1cc of 40mg /cc of kenalog was injected without resistance or patient discomfort. The needle was then removed and a bandaid applied over the  insertion site.    Follow-up: Patient tolerated the procedure well without complications. Standard post-procedure care and return precautions were provided.

## 2023-10-10 ENCOUNTER — Ambulatory Visit (INDEPENDENT_AMBULATORY_CARE_PROVIDER_SITE_OTHER): Payer: MEDICAID | Admitting: Internal Medicine

## 2023-10-10 VITALS — BP 157/78 | HR 96 | Temp 98.6°F | Ht 65.0 in | Wt 184.5 lb

## 2023-10-10 DIAGNOSIS — M7501 Adhesive capsulitis of right shoulder: Secondary | ICD-10-CM

## 2023-10-10 DIAGNOSIS — I10 Essential (primary) hypertension: Secondary | ICD-10-CM | POA: Diagnosis not present

## 2023-10-10 DIAGNOSIS — M25511 Pain in right shoulder: Secondary | ICD-10-CM

## 2023-10-10 DIAGNOSIS — S46011D Strain of muscle(s) and tendon(s) of the rotator cuff of right shoulder, subsequent encounter: Secondary | ICD-10-CM

## 2023-10-10 MED ORDER — METHOCARBAMOL 500 MG PO TABS: 500.0000 mg | ORAL_TABLET | Freq: Every day | ORAL | 0 refills | Status: DC

## 2023-10-10 NOTE — Patient Instructions (Addendum)
[name], it was a pleasure seeing you today! You endorsed feeling well today. Below are some of the things we talked about this visit. We look forward to seeing you in the follow up appointment!  Today we discussed: For your shoulder pain, I gave you an injection this visit, we will refer you to physical therapy and orthopedics.   Avoid NSAID use and use maximum dose of tylenol which is 3000 mg daily. I will refill your robaxin which can help with the pain as well.     I have ordered the following labs today:  Lab Orders  No laboratory test(s) ordered today      Referrals ordered today:   Referral Orders         Ambulatory referral to Physical Therapy         Ambulatory referral to Orthopedic Surgery       I have ordered the following medication/changed the following medications:   Stop the following medications: Medications Discontinued During This Encounter  Medication Reason   methocarbamol (ROBAXIN) 500 MG tablet Reorder     Start the following medications: Meds ordered this encounter  Medications   methocarbamol (ROBAXIN) 500 MG tablet    Sig: Take 1 tablet (500 mg total) by mouth daily.    Dispense:  30 tablet    Refill:  0     Follow-up: 3-4 week follow up   Please make sure to arrive 15 minutes prior to your next appointment. If you arrive late, you may be asked to reschedule.   We look forward to seeing you next time. Please call our clinic at [phone] if you have any questions or concerns. The best time to call is Monday-Friday from 9am-4pm, but there is someone available 24/7. If after hours or the weekend, call the main hospital number and ask for the Internal Medicine Resident On-Call. If you need medication refills, please notify your pharmacy one week in advance and they will send [place] a request.  Thank you for letting us take part in your care. Wishing you the best!  Thank you, Gwenevere Abbot, MD

## 2023-10-11 NOTE — Assessment & Plan Note (Addendum)
Patient presented for a follow up on right arm pain/weakness. She states this has been going on for a couple of months. She had MRI done recently of right shoulder that shows mild supraspinatus and infraspinatus tendinosis. Mild subscapularis tendinosis with partial thickness tear. She states conservative measures are not helping and she needs something strong for pain. Discussed multiple treatment options. Given pt's exam of limited range of motion, with concern of developing adhesive capsulitis, steroid injection was chosen. Pt tolerated this well and noted marked improvement in her ROM and her pain. Will have pt take 3000 g every 6 hours, gabapentin 600 TID, robaxin and use thermal therapy. Advised her to avoid NSAIDs given her CAD s/p stent placement. Will refer her to orthopedic surgery and physical therapy. She will likely respond to non-operative management.   Addendum: Pt able to get appointment with orthopedic surgery for 10/28/2023.

## 2023-10-11 NOTE — Assessment & Plan Note (Signed)
Pt blood pressure elevated today but pt is significant pain from her right shoulder. Will continue current regimen until pain is better controlled.

## 2023-10-13 ENCOUNTER — Emergency Department (HOSPITAL_COMMUNITY): Payer: MEDICAID

## 2023-10-13 ENCOUNTER — Encounter (HOSPITAL_COMMUNITY): Payer: Self-pay

## 2023-10-13 ENCOUNTER — Other Ambulatory Visit: Payer: Self-pay

## 2023-10-13 ENCOUNTER — Emergency Department (HOSPITAL_COMMUNITY)
Admission: EM | Admit: 2023-10-13 | Discharge: 2023-10-13 | Disposition: A | Discharge status: Home / Self Care | Payer: MEDICAID | Attending: Emergency Medicine | Admitting: Emergency Medicine

## 2023-10-13 DIAGNOSIS — F419 Anxiety disorder, unspecified: Secondary | ICD-10-CM | POA: Diagnosis not present

## 2023-10-13 DIAGNOSIS — I10 Essential (primary) hypertension: Secondary | ICD-10-CM | POA: Insufficient documentation

## 2023-10-13 DIAGNOSIS — I2510 Atherosclerotic heart disease of native coronary artery without angina pectoris: Secondary | ICD-10-CM | POA: Insufficient documentation

## 2023-10-13 DIAGNOSIS — Z955 Presence of coronary angioplasty implant and graft: Secondary | ICD-10-CM | POA: Insufficient documentation

## 2023-10-13 DIAGNOSIS — Z7982 Long term (current) use of aspirin: Secondary | ICD-10-CM | POA: Diagnosis not present

## 2023-10-13 DIAGNOSIS — Z79899 Other long term (current) drug therapy: Secondary | ICD-10-CM | POA: Diagnosis not present

## 2023-10-13 DIAGNOSIS — R079 Chest pain, unspecified: Secondary | ICD-10-CM

## 2023-10-13 DIAGNOSIS — R072 Precordial pain: Secondary | ICD-10-CM | POA: Diagnosis present

## 2023-10-13 LAB — CBC
HCT: 38.7 % (ref 36.0–46.0)
Hemoglobin: 12.7 g/dL (ref 12.0–15.0)
MCH: 29.1 pg (ref 26.0–34.0)
MCHC: 32.8 g/dL (ref 30.0–36.0)
MCV: 88.6 fL (ref 80.0–100.0)
Platelets: 337 10*3/uL (ref 150–400)
RBC: 4.37 MIL/uL (ref 3.87–5.11)
RDW: 13.3 % (ref 11.5–15.5)
WBC: 10.3 10*3/uL (ref 4.0–10.5)
nRBC: 0 % (ref 0.0–0.2)

## 2023-10-13 LAB — BASIC METABOLIC PANEL
Anion gap: 12 (ref 5–15)
BUN: 7 mg/dL (ref 6–20)
CO2: 23 mmol/L (ref 22–32)
Calcium: 10.1 mg/dL (ref 8.9–10.3)
Chloride: 103 mmol/L (ref 98–111)
Creatinine, Ser: 0.73 mg/dL (ref 0.44–1.00)
GFR, Estimated: >60 mL/min (ref >60)
Glucose, Bld: 200 mg/dL — ABNORMAL HIGH (ref 70–99)
Potassium: 3.7 mmol/L (ref 3.5–5.1)
Sodium: 138 mmol/L (ref 135–145)

## 2023-10-13 LAB — TROPONIN I (HIGH SENSITIVITY)
Troponin I (High Sensitivity): 4 ng/L (ref <18)
Troponin I (High Sensitivity): 4 ng/L (ref <18)

## 2023-10-13 MED ORDER — HYDRALAZINE HCL 20 MG/ML IJ SOLN
10.0000 mg | Freq: Once | INTRAMUSCULAR | Status: AC
Start: 2023-10-13 — End: 2023-10-13
  Administered 2023-10-13: 10 mg via INTRAVENOUS
  Filled 2023-10-13: qty 1

## 2023-10-13 MED ORDER — GABAPENTIN 300 MG PO CAPS
600.0000 mg | ORAL_CAPSULE | Freq: Once | ORAL | Status: AC
  Administered 2023-10-13: 600 mg via ORAL
  Filled 2023-10-13: qty 2

## 2023-10-13 MED ORDER — ASPIRIN 325 MG PO TABS
325.0000 mg | ORAL_TABLET | Freq: Once | ORAL | Status: AC
  Administered 2023-10-13: 325 mg via ORAL
  Filled 2023-10-13: qty 1

## 2023-10-13 NOTE — ED Provider Notes (Signed)
[place] EMERGENCY DEPARTMENT AT [facility] Provider Note   CSN: [id number] Arrival date & time: 10/13/23  1923     History  Chief Complaint  Patient presents with   Chest Pain    Vanessa Case is a 52 y.o. female history of CAD status post stent, PTSD, here presenting with chest pain and back pain.  Patient has chronic back pain and is on gabapentin.  Patient has been having substernal chest pain going on for the last several days.  She states that she had a steroid injection to the right shoulder 3 days ago.  She states that the pain in her shoulder has improved but her blood pressure becomes more elevated.  She states that she checked it today and was over 180.  She states that she is worried that she has a heart attack.  She denies sudden onset of chest pain radiated to the back but states that it has been gradual.  The history is provided by the patient.       Home Medications Prior to Admission medications   Medication Sig Start Date End Date Taking? Authorizing Provider  aspirin EC 81 MG EC tablet Take 1 tablet (81 mg total) by mouth daily. 02/09/20   Arty Baumgartner, NP  atorvastatin (LIPITOR) 80 MG tablet Take 1 tablet (80 mg total) by mouth daily. 02/09/20   Arty Baumgartner, NP  Blood Pressure Monitoring (BLOOD PRESSURE CUFF) MISC Blood pressure cuff 06/04/23   Croitoru, Mihai, MD  ezetimibe (ZETIA) 10 MG tablet Take 1 tablet (10 mg total) by mouth daily. 06/16/23   Croitoru, Mihai, MD  FLUoxetine (PROZAC) 20 MG capsule Take 3 capsules (60 mg total) by mouth daily. 08/13/23   Lance Muss, MD  fluticasone (FLOVENT HFA) 44 MCG/ACT inhaler Inhale 2 puffs into the lungs 2 (two) times daily as needed (for shortness of breath). 03/14/16   Benjiman Core, MD  gabapentin (NEURONTIN) 300 MG capsule Take 2 capsules (600 mg total) by mouth 3 (three) times daily. TAKE 2 CAPSULE BY MOUTH IN THE MORNING, 2 CAPSULE IN THE EVENING, AND 2 CAPSULES AT NIGHT  08/27/23   Court Joy, PA-C  hydrOXYzine (ATARAX) 25 MG tablet Take 1 tablet (25 mg total) by mouth daily. 08/13/23   Lance Muss, MD  Ibuprofen (ADVIL PO) Take 2 capsules by mouth as needed.    [provider]  IBUPROFEN PO Take 2 capsules by mouth as needed.    [provider]  irbesartan (AVAPRO) 300 MG tablet Take 1 tablet (300 mg total) by mouth daily. 08/15/23 08/14/24  Nooruddin, Jason Fila, MD  metFORMIN (GLUCOPHAGE) 1000 MG tablet Take 1 tablet (1,000 mg total) by mouth 2 (two) times daily with a meal. 09/22/23   Nooruddin, Jason Fila, MD  methocarbamol (ROBAXIN) 500 MG tablet Take 1 tablet (500 mg total) by mouth daily. 10/10/23   Gwenevere Abbot, MD  mirtazapine (REMERON) 15 MG tablet Take 1 tablet (15 mg total) by mouth at bedtime. 08/13/23   Lance Muss, MD  Multiple Vitamins-Calcium (ONE-A-DAY WOMENS FORMULA PO) Take 1 tablet by mouth daily. Patient not taking: Reported on 06/04/2023    [provider]  prazosin (MINIPRESS) 1 MG capsule Take 1 capsule (1 mg total) by mouth at bedtime. To prevent nightmares 08/13/23   Lance Muss, MD  QUEtiapine (SEROQUEL) 50 MG tablet Take 1 tablet (50 mg total) by mouth at bedtime. 08/27/23   Court Joy, PA-C  Semaglutide, 1 MG/DOSE,  4 MG/3ML SOPN Inject 1 mg into the skin once a week. 09/01/23   NooruddinJason Fila, MD      Allergies    Percocet [oxycodone-acetaminophen] and Ambien [zolpidem]    Review of Systems   Review of Systems  Cardiovascular:  Positive for chest pain.  All other systems reviewed and are negative.   Physical Exam Updated Vital Signs BP (!) 191/99 (BP Location: Right Arm)   Pulse (!) 104   Temp 98.5 F (36.9 C)   Resp 18   Ht 5\' 5"  (1.651 m)   Wt 83.5 kg   LMP 10/07/2011   SpO2 100%   BMI 30.62 kg/m  Physical Exam Vitals and nursing note reviewed.  Constitutional:      Comments: Anxious  HENT:     Head: Normocephalic.  Eyes:     Extraocular Movements: Extraocular  movements intact.     Pupils: Pupils are equal, round, and reactive to light.  Cardiovascular:     Rate and Rhythm: Normal rate and regular rhythm.     Heart sounds: Normal heart sounds.  Pulmonary:     Effort: Pulmonary effort is normal.     Breath sounds: Normal breath sounds.  Abdominal:     General: Bowel sounds are normal.     Palpations: Abdomen is soft.  Musculoskeletal:        General: Normal range of motion.     Cervical back: Normal range of motion and neck supple.  Skin:    General: Skin is warm.     Capillary Refill: Capillary refill takes less than 2 seconds.  Neurological:     General: No focal deficit present.     Mental Status: She is oriented to person, place, and time.  Psychiatric:        Mood and Affect: Mood normal.     ED Results / Procedures / Treatments   Labs (all labs ordered are listed, but only abnormal results are displayed) Labs Reviewed  CBC  BASIC METABOLIC PANEL  TROPONIN I (HIGH SENSITIVITY)    EKG None  Radiology No results found.  Procedures Procedures    Medications Ordered in ED Medications  hydrALAZINE (APRESOLINE) injection 10 mg (has no administration in time range)  aspirin tablet 325 mg (has no administration in time range)  gabapentin (NEURONTIN) capsule 600 mg (has no administration in time range)    ED Course/ Medical Decision Making/ A&P                                 Medical Decision Making Vanessa Case is a 52 y.o. female who presented with chest pain and back pain and hypertension.  Patient just received steroid injection to the right shoulder recently.  I wonder if she had hypertension as a side effect of steroid injection and she has symptomatic hypertension.  I have low suspicion for dissection.  Also consider ACS as well.  Plan to get CBC and CMP and troponin x 2 and chest x-ray.  Will give IV hydralazine and reassess.  11:17 PM Patient's blood pressure down to 170s.  Troponin negative x 2.   Patient states that her pain is better.  Offered admission given history of CAD but patient states that she wants to go home.  Encouraged her to follow-up with her cardiologist and internal medicine doctor     Problems Addressed: Hypertension, unspecified type: chronic illness or injury Nonspecific chest pain: acute  illness or injury  Amount and/or Complexity of Data Reviewed Labs: ordered. Decision-making details documented in ED Course. Radiology: ordered and independent interpretation performed. Decision-making details documented in ED Course. ECG/medicine tests: ordered and independent interpretation performed. Decision-making details documented in ED Course.  Risk OTC drugs. Prescription drug management.    Final Clinical Impression(s) / ED Diagnoses Final diagnoses:  None    Rx / DC Orders ED Discharge Orders     None         Charlynne Pander, MD 10/13/23 2317

## 2023-10-13 NOTE — ED Triage Notes (Signed)
Pt arrived from home via POV c/o chest pain that began on Friday and has continued to get worse. 7/10, pressure to central chest. Accompanied with SOB, n/v. Pt very hypertensive in triage 191/99. Pt has significant cardiac hx

## 2023-10-13 NOTE — Discharge Instructions (Addendum)
As we discussed, your troponin is normal today.  Your blood pressure is elevated and may be a side effect of your steroid shot.  Please keep a detailed record of your blood pressure and follow-up with your doctor next week  Return to ER if you have worse chest pain or shortness of breath

## 2023-10-13 NOTE — Progress Notes (Signed)
Internal Medicine Clinic Attending  I was physically present during the key portions of the resident provided service and participated in the medical decision making of patient's management care. I reviewed pertinent patient test results.  The assessment, diagnosis, and plan were formulated together and I agree with the documentation in the resident's note. I was presetn for the entire procedure. Gust Rung, DO

## 2023-10-13 NOTE — Addendum Note (Signed)
Addended by: Carlynn Purl C on: 10/13/2023 04:11 PM   Modules accepted: Level of Service

## 2023-10-17 ENCOUNTER — Other Ambulatory Visit: Payer: Self-pay | Admitting: *Deleted

## 2023-10-17 DIAGNOSIS — E1142 Type 2 diabetes mellitus with diabetic polyneuropathy: Secondary | ICD-10-CM

## 2023-10-17 MED ORDER — GABAPENTIN 300 MG PO CAPS: 600.0000 mg | ORAL_CAPSULE | Freq: Three times a day (TID) | ORAL | 1 refills | Status: DC

## 2023-10-21 ENCOUNTER — Other Ambulatory Visit: Payer: Self-pay | Admitting: Internal Medicine

## 2023-10-21 ENCOUNTER — Other Ambulatory Visit: Payer: Self-pay | Admitting: Student

## 2023-10-21 DIAGNOSIS — Z1231 Encounter for screening mammogram for malignant neoplasm of breast: Secondary | ICD-10-CM

## 2023-10-27 ENCOUNTER — Telehealth (HOSPITAL_COMMUNITY): Payer: Self-pay

## 2023-10-27 NOTE — Telephone Encounter (Signed)
 Pt requested refill, patient has missed multiple appointment will have to come in as a walk in.

## 2023-10-28 ENCOUNTER — Other Ambulatory Visit: Payer: Self-pay

## 2023-10-28 ENCOUNTER — Ambulatory Visit (INDEPENDENT_AMBULATORY_CARE_PROVIDER_SITE_OTHER): Payer: MEDICAID | Admitting: Orthopaedic Surgery

## 2023-10-28 ENCOUNTER — Ambulatory Visit (INDEPENDENT_AMBULATORY_CARE_PROVIDER_SITE_OTHER): Payer: MEDICAID | Admitting: Sports Medicine

## 2023-10-28 ENCOUNTER — Encounter: Payer: Self-pay | Admitting: Sports Medicine

## 2023-10-28 ENCOUNTER — Encounter: Payer: Self-pay | Admitting: Orthopaedic Surgery

## 2023-10-28 DIAGNOSIS — M12811 Other specific arthropathies, not elsewhere classified, right shoulder: Secondary | ICD-10-CM | POA: Diagnosis not present

## 2023-10-28 DIAGNOSIS — M25511 Pain in right shoulder: Secondary | ICD-10-CM

## 2023-10-28 DIAGNOSIS — G8929 Other chronic pain: Secondary | ICD-10-CM

## 2023-10-28 MED ORDER — LIDOCAINE HCL 1 % IJ SOLN
2.0000 mL | INTRAMUSCULAR | Status: AC | PRN
  Administered 2023-10-28: 2 mL

## 2023-10-28 MED ORDER — BUPIVACAINE HCL 0.25 % IJ SOLN
2.0000 mL | INTRAMUSCULAR | Status: AC | PRN
  Administered 2023-10-28: 2 mL via INTRA_ARTICULAR

## 2023-10-28 MED ORDER — METHYLPREDNISOLONE ACETATE 40 MG/ML IJ SUSP
40.0000 mg | INTRAMUSCULAR | Status: AC | PRN
  Administered 2023-10-28: 40 mg via INTRA_ARTICULAR

## 2023-10-28 NOTE — Progress Notes (Signed)
 Office Visit Note   Patient: Vanessa Case           Date of Birth: 1971/07/04           MRN: [id number] Visit Date: 10/28/2023              Requested by: Rosan Dayton BROCKS, DO [address]  [place],  [place] [address] PCP: Nooruddin, Saad, MD   Assessment & Plan: Visit Diagnoses:  1. Chronic right shoulder pain     Plan: Patient is a 53 year old female with right shoulder pain impression is adhesive capsulitis.  She is diabetic with A1c of 8.0.  Recommend tighter control of the diabetes.  MRI reviewed and does not show any structural problems or surgical problems at this time.  Recommend intra-articular steroid injection and outpatient physical therapy which has already been set up by her PCP.  We will send her to Dr. Burnetta for the injection.  Follow-up as needed.  Follow-Up Instructions: No follow-ups on file.   Orders:  No orders of the defined types were placed in this encounter.  No orders of the defined types were placed in this encounter.     Procedures: No procedures performed   Clinical Data: No additional findings.   Subjective: Chief Complaint  Patient presents with   Right Shoulder - Pain    HPI Patient is a 53 year old female here for evaluation of chronic right shoulder pain.  Had a fall in June of this year while going downstairs.  She has had 2 injections in her shoulder by PCP but sounds like they were intramuscular.  Each injection helped for about 8 hours.  She is diabetic. Review of Systems  Constitutional: Negative.   HENT: Negative.    Eyes: Negative.   Respiratory: Negative.    Cardiovascular: Negative.   Endocrine: Negative.   Musculoskeletal: Negative.   Neurological: Negative.   Hematological: Negative.   Psychiatric/Behavioral: Negative.    All other systems reviewed and are negative.    Objective: Vital Signs: LMP 10/07/2011   Physical Exam Vitals and nursing note reviewed.  Constitutional:      Appearance: She is  well-developed.  HENT:     Head: Atraumatic.     Nose: Nose normal.  Eyes:     Extraocular Movements: Extraocular movements intact.  Cardiovascular:     Pulses: Normal pulses.  Pulmonary:     Effort: Pulmonary effort is normal.  Abdominal:     Palpations: Abdomen is soft.  Musculoskeletal:     Cervical back: Neck supple.  Skin:    General: Skin is warm.     Capillary Refill: Capillary refill takes less than 2 seconds.  Neurological:     Mental Status: She is alert. Mental status is at baseline.  Psychiatric:        Behavior: Behavior normal.        Thought Content: Thought content normal.        Judgment: Judgment normal.     Ortho Exam Exam of the right shoulder shows pain with flexion to 115 degrees.  External rotation to 45 degrees with pain.  Abduction to 90 degrees with pain.  Manual muscle testing of the rotator cuff is normal with slight pain. Specialty Comments:  No specialty comments available.  Imaging: No results found.   PMFS History: Patient Active Problem List   Diagnosis Date Noted   Partial thickness rotator cuff tear 08/16/2023   GAD (generalized anxiety disorder) 08/13/2023   PTSD (post-traumatic stress  disorder) 08/13/2023   Cannabis use disorder, severe, dependence (HCC) 08/13/2023   Lumbar radiculopathy 03/31/2023   Postherpetic neuralgia 12/24/2022   Substance abuse (HCC) 02/08/2020   NSTEMI (non-ST elevated myocardial infarction) (HCC) 02/05/2020   Major depressive disorder, recurrent, severe without psychotic features (HCC)    Type 2 diabetes mellitus with diabetic polyneuropathy (HCC) 05/30/2015   Essential hypertension 05/30/2015   Hyperlipidemia 05/30/2015   Bipolar 2 disorder, major depressive episode (HCC) 09/20/2014   Past Medical History:  Diagnosis Date   Anemia    Anxiety    Arthritis    Cholelithiasis with acute cholecystitis 06/28/2013   Chronic abdominal pain    Chronic back pain    Chronic headache    Chronic neck pain     Coronary artery disease    Depression    Diabetes mellitus    adult onset dm-maintained on glipizide  and metformin , pt states fasting glucose runs 110   Fibromyalgia    GERD (gastroesophageal reflux disease)    HLD (hyperlipidemia) 05/30/2015   Hyperlipidemia    Hypertension    maintained on lisinopril  hct, metoprolol -134/86 at pat visit   Neuromuscular disorder (HCC)    Neuropathy    NSTEMI (non-ST elevated myocardial infarction) (HCC) 02/05/2020   Obesity    PTSD (post-traumatic stress disorder)    Right arm weakness 03/05/2013   Substance abuse (HCC)     Family History  Problem Relation Age of Onset   Diabetes Mother    Atrial fibrillation Mother    Cystic fibrosis Father    Diabetes Maternal Grandmother    Heart disease Maternal Grandfather     Past Surgical History:  Procedure Laterality Date   ABDOMINAL HYSTERECTOMY  10/30/2011   Procedure: HYSTERECTOMY ABDOMINAL;  Surgeon: Debby JULIANNA Lares, MD;  Location: [facility] ORS;  Service: Gynecology;  Laterality: N/A;   CESAREAN SECTION  x3   CHOLECYSTECTOMY N/A 06/28/2013   Procedure: LAPAROSCOPIC CHOLECYSTECTOMY;  Surgeon: Vicenta DELENA Poli, MD;  Location: [facility] OR;  Service: General;  Laterality: N/A;   CORONARY STENT INTERVENTION N/A 02/07/2020   Procedure: CORONARY STENT INTERVENTION;  Surgeon: Anner Alm ORN, MD;  Location: [facility] INVASIVE CV LAB;  Service: Cardiovascular;  Laterality: N/A;   LEFT HEART CATH AND CORONARY ANGIOGRAPHY N/A 02/07/2020   Procedure: LEFT HEART CATH AND CORONARY ANGIOGRAPHY;  Surgeon: Anner Alm ORN, MD;  Location[facility]VASIVE CV LAB;  Service: Cardiovascular;  Laterality: N/A;   SALPINGOOPHORECTOMY  10/30/2011   Procedure: SALPINGO OOPHERECTOMY;  Surgeon: Debby JULIANNA Lares, MD;  Location: [facility];  Service: Gynecology;  Laterality: Right;   WRIST SURGERY     carpel tunnel and tendonitis   Social History   Occupational History   Not on file  Tobacco Use   Smoking status: Former    Current packs/day: 0.00     Types: Cigarettes    Quit date: 01/2020    Years since quitting: 3.7   Smokeless tobacco: Never   Tobacco comments:    has smoked for 14 years total - quit for 6 years at one point but has been smoking the last 3 years  Vaping Use   Vaping status: Never Used  Substance and Sexual Activity   Alcohol  use: Not Currently    Alcohol /week: 0.0 standard drinks of alcohol    Drug use: Yes    Types: Cocaine, Marijuana, Crack cocaine    Comment: last used cocaine 6/19 at 1800 and marijuana 6/20 0700   Sexual activity: Not on file

## 2023-10-28 NOTE — Progress Notes (Signed)
   Procedure Note  Patient: Vanessa Case             Date of Birth: 11-Oct-1971           MRN: [id number]             Visit Date: 10/28/2023  She is a type-II diabetic.  Lab Results  Component Value Date   HGBA1C 8.0 (A) 08/15/2023   Procedures: Visit Diagnoses:  1. Chronic right shoulder pain   2. Rotator cuff arthropathy of right shoulder    Large Joint Inj: R glenohumeral on 10/28/2023 9:47 AM Indications: pain Details: 22 G 3.5 in needle, ultrasound-guided posterior approach Medications: 2 mL lidocaine  1 %; 2 mL bupivacaine  0.25 %; 40 mg methylPREDNISolone  acetate 40 MG/ML Outcome: tolerated well, no immediate complications  US -guided glenohumeral joint injection, right shoulder After discussion on risks/benefits/indications, informed verbal consent was obtained. A timeout was then performed. The patient was positioned lying lateral recumbent on examination table. The patient's shoulder was prepped with betadine and multiple alcohol  swabs and utilizing ultrasound guidance, the patient's glenohumeral joint was identified on ultrasound. Using ultrasound guidance a 22-gauge, 3.5 inch needle with a mixture of 2:2:1 cc's lidocaine :bupivicaine:depomedrol was directed from a lateral to medial direction via in-plane technique into the glenohumeral joint with visualization of appropriate spread of injectate into the joint. Patient tolerated the procedure well without immediate complications.      Procedure, treatment alternatives, risks and benefits explained, specific risks discussed. Consent was given by the patient. Immediately prior to procedure a time out was called to verify the correct patient, procedure, equipment, support staff and site/side marked as required. Patient was prepped and draped in the usual sterile fashion.     - evaluated her 5 mins post-injection and had some relief of pain and function already - follow-up with Dr. Jerri as indicated; I am happy to see them  as needed  Lonell Sprang, DO Primary Care Sports Medicine Physician  [facility]  This note was dictated using Dragon naturally speaking software and may contain errors in syntax, spelling, or content which have not been identified prior to signing this note.

## 2023-10-30 ENCOUNTER — Encounter: Payer: MEDICAID | Admitting: Internal Medicine

## 2023-10-30 ENCOUNTER — Encounter: Payer: Self-pay | Admitting: Psychiatry

## 2023-10-31 ENCOUNTER — Encounter: Payer: MEDICAID | Admitting: Student

## 2023-11-03 ENCOUNTER — Ambulatory Visit
Admission: RE | Admit: 2023-11-03 | Discharge: 2023-11-03 | Disposition: A | Discharge status: Home / Self Care | Payer: MEDICAID | Source: Ambulatory Visit

## 2023-11-03 DIAGNOSIS — Z1231 Encounter for screening mammogram for malignant neoplasm of breast: Secondary | ICD-10-CM
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
## 2023-11-04 ENCOUNTER — Ambulatory Visit: Payer: MEDICAID | Attending: Internal Medicine | Admitting: Physical Therapy

## 2023-11-04 ENCOUNTER — Encounter: Payer: Self-pay | Admitting: Physical Therapy

## 2023-11-04 DIAGNOSIS — M25511 Pain in right shoulder: Secondary | ICD-10-CM | POA: Insufficient documentation

## 2023-11-04 DIAGNOSIS — G8929 Other chronic pain: Secondary | ICD-10-CM | POA: Diagnosis present

## 2023-11-04 DIAGNOSIS — M6281 Muscle weakness (generalized): Secondary | ICD-10-CM | POA: Diagnosis present

## 2023-11-11 ENCOUNTER — Ambulatory Visit: Payer: MEDICAID | Admitting: Physical Therapy

## 2023-11-11 ENCOUNTER — Encounter: Payer: Self-pay | Admitting: Physical Therapy

## 2023-11-11 DIAGNOSIS — M6281 Muscle weakness (generalized): Secondary | ICD-10-CM

## 2023-11-11 DIAGNOSIS — M25511 Pain in right shoulder: Secondary | ICD-10-CM | POA: Diagnosis not present

## 2023-11-11 DIAGNOSIS — G8929 Other chronic pain: Secondary | ICD-10-CM
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
## 2023-11-13 ENCOUNTER — Ambulatory Visit: Payer: MEDICAID

## 2023-11-17 ENCOUNTER — Ambulatory Visit: Payer: MEDICAID | Admitting: Student

## 2023-11-17 ENCOUNTER — Encounter: Payer: Self-pay | Admitting: Student

## 2023-11-17 ENCOUNTER — Other Ambulatory Visit: Payer: Self-pay

## 2023-11-17 VITALS — BP 148/80 | HR 98 | Temp 97.9°F | Ht 65.0 in | Wt 183.5 lb

## 2023-11-17 DIAGNOSIS — F332 Major depressive disorder, recurrent severe without psychotic features: Secondary | ICD-10-CM

## 2023-11-17 DIAGNOSIS — Z7984 Long term (current) use of oral hypoglycemic drugs: Secondary | ICD-10-CM

## 2023-11-17 DIAGNOSIS — B0229 Other postherpetic nervous system involvement: Secondary | ICD-10-CM

## 2023-11-17 DIAGNOSIS — I10 Essential (primary) hypertension: Secondary | ICD-10-CM | POA: Diagnosis not present

## 2023-11-17 DIAGNOSIS — R300 Dysuria: Secondary | ICD-10-CM

## 2023-11-17 DIAGNOSIS — R350 Frequency of micturition: Secondary | ICD-10-CM | POA: Diagnosis not present

## 2023-11-17 DIAGNOSIS — E1142 Type 2 diabetes mellitus with diabetic polyneuropathy: Secondary | ICD-10-CM

## 2023-11-17 LAB — POCT URINALYSIS DIPSTICK
Bilirubin, UA: NEGATIVE
Blood, UA: NEGATIVE
Glucose, UA: NEGATIVE
Ketones, UA: NEGATIVE
Nitrite, UA: NEGATIVE
Protein, UA: POSITIVE — AB
Spec Grav, UA: 1.025 (ref 1.010–1.025)
Urobilinogen, UA: 0.2 U/dL
pH, UA: 6 (ref 5.0–8.0)

## 2023-11-17 LAB — POCT GLYCOSYLATED HEMOGLOBIN (HGB A1C): Hemoglobin A1C: 10.1 % — AB (ref 4.0–5.6)

## 2023-11-17 LAB — GLUCOSE, CAPILLARY: Glucose-Capillary: 130 mg/dL — ABNORMAL HIGH (ref 70–99)

## 2023-11-17 MED ORDER — EMPAGLIFLOZIN 10 MG PO TABS: 10.0000 mg | ORAL_TABLET | Freq: Every day | ORAL | 2 refills | Status: DC

## 2023-11-17 MED ORDER — AMLODIPINE BESYLATE 5 MG PO TABS: 5.0000 mg | ORAL_TABLET | Freq: Every day | ORAL | 11 refills | Status: DC
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
## 2023-11-17 NOTE — Patient Instructions (Signed)
Thank you, [name] for allowing [place] to provide your care today. Today we discussed blood pressure, diabetes, mood and, urination pain .    I have ordered the following labs for you:   Lab Orders         Glucose, capillary         POC Hbg A1C         POCT Urinalysis Dip Manual        I have ordered the following medication/changed the following medications:   Start the following medications: Meds ordered this encounter  Medications   amLODipine (NORVASC) 5 MG tablet    Sig: Take 1 tablet (5 mg total) by mouth daily.    Dispense:  30 tablet    Refill:  11     Follow up: 2 weeks for BP check    Should you have any questions or concerns please call the internal medicine clinic at [phone].     Please note that our late policy has changed.  If you are more than 15 minutes late to your appointment, you may be asked to reschedule your appointment.  Dr. Hessie Diener, D.O. [facility]

## 2023-11-17 NOTE — Progress Notes (Signed)
Established Patient Office Visit  Subjective   Patient ID: Vanessa Case, female    DOB: 1971-07-28  Age: 53 y.o. MRN: [id number]  Chief Complaint  Patient presents with   Follow-up   Hypertension   Diabetes   Hyperlipidemia    Vanessa Case is a 53 y.o. who presents to the clinic for follow-up of hypertension and diabetes. Please see problem based assessment and plan for additional details.  Patient Active Problem List   Diagnosis Date Noted   Urinary frequency 11/17/2023   Partial thickness rotator cuff tear 08/16/2023   GAD (generalized anxiety disorder) 08/13/2023   PTSD (post-traumatic stress disorder) 08/13/2023   Cannabis use disorder, severe, dependence (HCC) 08/13/2023   Lumbar radiculopathy 03/31/2023   Postherpetic neuralgia 12/24/2022   Substance abuse (HCC) 02/08/2020   NSTEMI (non-ST elevated myocardial infarction) (HCC) 02/05/2020   Major depressive disorder, recurrent, severe without psychotic features (HCC)    Type 2 diabetes mellitus with diabetic polyneuropathy (HCC) 05/30/2015   Essential hypertension 05/30/2015   Hyperlipidemia 05/30/2015   Bipolar 2 disorder, major depressive episode (HCC) 09/20/2014      Objective:    BP (!) 148/80 (BP Location: Left Arm, Cuff Size: Large)   Pulse 98   Temp 97.9 F (36.6 C) (Oral)   Ht 5\' 5"  (1.651 m)   Wt 183 lb 8 oz (83.2 kg)   LMP 10/07/2011   SpO2 100%   BMI 30.54 kg/m  BP Readings from Last 3 Encounters:  11/17/23 (!) 148/80  10/13/23 (!) 179/98  10/10/23 (!) 157/78   Wt Readings from Last 3 Encounters:  11/17/23 183 lb 8 oz (83.2 kg)  10/13/23 184 lb (83.5 kg)  10/10/23 184 lb 8 oz (83.7 kg)      Physical Exam Vitals reviewed.  Constitutional:      General: She is not in acute distress.    Appearance: She is not ill-appearing, toxic-appearing or diaphoretic.  Cardiovascular:     Rate and Rhythm: Normal rate and regular rhythm.     Heart sounds: No murmur  heard. Pulmonary:     Effort: Pulmonary effort is normal. No respiratory distress.     Breath sounds: Normal breath sounds. No stridor. No wheezing or rhonchi.  Skin:    General: Skin is warm and dry.  Neurological:     Mental Status: She is alert.  Psychiatric:        Mood and Affect: Affect normal.        Thought Content: Thought content does not include homicidal or suicidal ideation. Thought content does not include suicidal plan.     Comments: Depressed mood.      Results for orders placed or performed in visit on 11/17/23  Glucose, capillary  Result Value Ref Range   Glucose-Capillary 130 (H) 70 - 99 mg/dL  POC Hbg W2N  Result Value Ref Range   Hemoglobin A1C 10.1 (A) 4.0 - 5.6 %   HbA1c POC (<> result, manual entry)     HbA1c, POC (prediabetic range)     HbA1c, POC (controlled diabetic range)    POCT Urinalysis Dipstick (81002)  Result Value Ref Range   Color, UA Yellow    Clarity, UA Clear    Glucose, UA Negative Negative   Bilirubin, UA Negative    Ketones, UA Negative    Spec Grav, UA 1.025 1.010 - 1.025   Blood, UA Negative    pH, UA 6.0 5.0 - 8.0   Protein, UA Positive (A) Negative  Urobilinogen, UA 0.2 0.2 or 1.0 E.U./dL   Nitrite, UA Negative    Leukocytes, UA Small (1+) (A) Negative    Last CBC Lab Results  Component Value Date   WBC 10.3 10/13/2023   HGB 12.7 10/13/2023   HCT 38.7 10/13/2023   MCV 88.6 10/13/2023   MCH 29.1 10/13/2023   RDW 13.3 10/13/2023   PLT 337 10/13/2023   Last metabolic panel Lab Results  Component Value Date   GLUCOSE 200 (H) 10/13/2023   NA 138 10/13/2023   K 3.7 10/13/2023   CL 103 10/13/2023   CO2 23 10/13/2023   BUN 7 10/13/2023   CREATININE 0.73 10/13/2023   GFRNONAA >60 10/13/2023   CALCIUM 10.1 10/13/2023   PROT 7.8 05/19/2023   ALBUMIN 4.2 05/19/2023   BILITOT 0.7 05/19/2023   ALKPHOS 65 05/19/2023   AST 17 05/19/2023   ALT 21 05/19/2023   ANIONGAP 12 10/13/2023   Last lipids Lab Results   Component Value Date   CHOL 164 06/13/2023   HDL 51 06/13/2023   LDLCALC 93 06/13/2023   TRIG 114 06/13/2023   CHOLHDL 3.2 06/13/2023   Last hemoglobin A1c Lab Results  Component Value Date   HGBA1C 10.1 (A) 11/17/2023      The ASCVD Risk score (Arnett DK, et al., 2019) failed to calculate for the following reasons:   Risk score cannot be calculated because patient has a medical history suggesting prior/existing ASCVD     11/17/2023    1:22 PM 08/18/2023    1:10 PM 08/13/2023    1:54 PM  Depression screen PHQ 2/9  Decreased Interest 3 3 3   Down, Depressed, Hopeless 3 3 3   PHQ - 2 Score 6 6 6   Altered sleeping 3 3 3   Tired, decreased energy 3 3 3   Change in appetite 3 3 3   Feeling bad or failure about yourself  3 3 3   Trouble concentrating 3 3 3   Moving slowly or fidgety/restless 1 0 3  Suicidal thoughts 3 0 0  PHQ-9 Score 25 21 24   Difficult doing work/chores Somewhat difficult  Extremely dIfficult       Assessment & Plan:   Problem List Items Addressed This Visit       Cardiovascular and Mediastinum   Essential hypertension   Patient presents with a history of hypertension with a blood pressure today of 170/90 with a recheck of 162/90 and 148/80. Their hypertension is uncontrolled on a regimen of irbesartan 300 mg. Prior BMP in 09/2023, SCr was 0.73. They check their blood pressure at home with an average of 140s/90s.  Patient reports symptoms of hypertension urgency such as headaches and dizziness that started one month ago and has persisted.  Her hypertension became so severe that she was in the emergency department where her troponins and BMP were within normal limits and she was treated as hypertension urgency with IV hydralazine.  Patient was safely discharged home without need for hospitalization.  Plan: -Continue current regimen of irbesartan 300 mg -Start amlodipine 5 mg      Relevant Medications   amLODipine (NORVASC) 5 MG tablet     Endocrine   Type  2 diabetes mellitus with diabetic polyneuropathy (HCC) - Primary   Patient presents with a history of uncontrolled T2Dm with a prior A1c of 8.0 in October 2024.  Patient's A1c today is 10.1.  They are on a regimen of metformin 1000mg  BID,ozempic 1.5 mg weekly.  Patient denies a change in recent diet.  She does report source of infection of gingivitis and oral abscesses that she needs treated with oral surgery.  I suspect that her infection is possibly causing hyperglycemia.  Patient was also recently prescribed Remeron for appetite stimulation because of "the side effect of Ozempic causing me to not have an appetite".  Plan: -Will continue the metformin 1000 mg twice daily and Ozempic 1.5 mg weekly -A1c today -Ophthalmology referral order placed last visit -Will begin Jardiance 10 mg -Will need his A1c in 3 months -I recommended that patient speak to his psychiatrist about discontinue Remeron, and provided education on how GLP-1 agonist affect the GI system. Patient is okay with continuing Ozempic at the moment.      Relevant Medications   empagliflozin (JARDIANCE) 10 MG TABS tablet   Other Relevant Orders   POC Hbg A1C (Completed)     Nervous and Auditory   Postherpetic neuralgia   Patient reports worsening of her postherpetic neuralgia within this past week.  Patient is willing to continue gabapentin 600 mg 3 times daily and try Tylenol for pain regimen.   Plan -Follow-up pain at next visit -Will continue regimen of gabapentin 600 mg 3 times daily         Other   Major depressive disorder, recurrent, severe without psychotic features (HCC)   Patient has a history of MDD which is recurrent and severe.  Her PHQ-9 today was elevated at 25 with a prior PHQ-9 of 21 in October.  She is currently following with psychiatry.  She denies any SI/HI at this moment.  Patient also denies plans of suicide. Plan: -Encourage patient to continue follow-up with psychiatry -Does appear that patient is  on numerous psychiatric medications through her psychiatry group.  She is currently on Prozac 60 mg, Atarax 25 mg, prazosin 1 mg, trazodone 50 mg, Seroquel 50 mg. -Patient was also recently prescribed Remeron 15 mg from her psychiatrist for appetite stimulation, I recommended she talk to her psychiatrist about discontinuing Remeron and that her decreased appetite from her Ozempic is one of the mechanisms of how Ozempic helps to control her blood glucose.      Urinary frequency   Patient reported symptoms of increasing urinary frequency and "dysuria".  Upon further questioning, patient reports that her pain with urination is not a burning sensation or urethral  but is pain and discomfort from her postherpetic neuralgia of her lumbar spine.  Urine dipstick did show small leukocytes but was negative for nitrites.  I suspect that the patient's increase in urinary frequency is due to hyperglycemia especially since she has been having an increase in urinary frequency for greater than 1 week now.  She is afebrile and does not have chills.  Plan: -Will continue to treat patient's hyperglycemia -Patient was instructed to call the office or be evaluated if she develops pain with urination, fever, chills, worsening urinary frequency, or other symptoms consistent with urinary tract infection.      Other Visit Diagnoses       Dysuria       Relevant Orders   POCT Urinalysis Dipstick (60454) (Completed)       No follow-ups on file.    Faith Rogue, DO

## 2023-11-17 NOTE — Assessment & Plan Note (Signed)
Patient presents with a history of hypertension with a blood pressure today of 170/90 with a recheck of 162/90 and 148/80. Their hypertension is uncontrolled on a regimen of irbesartan 300 mg. Prior BMP in 09/2023, SCr was 0.73. They check their blood pressure at home with an average of 140s/90s.  Patient reports symptoms of hypertension urgency such as headaches and dizziness that started one month ago and has persisted.  Her hypertension became so severe that she was in the emergency department where her troponins and BMP were within normal limits and she was treated as hypertension urgency with IV hydralazine.  Patient was safely discharged home without need for hospitalization.  Plan: -Continue current regimen of irbesartan 300 mg -Start amlodipine 5 mg

## 2023-11-17 NOTE — Assessment & Plan Note (Addendum)
Patient presents with a history of uncontrolled T2Dm with a prior A1c of 8.0 in October 2024.  Patient's A1c today is 10.1.  They are on a regimen of metformin 1000mg  BID,ozempic 1.5 mg weekly.  Patient denies a change in recent diet.  She does report source of infection of gingivitis and oral abscesses that she needs treated with oral surgery.  I suspect that her infection is possibly causing hyperglycemia.  Patient was also recently prescribed Remeron for appetite stimulation because of "the side effect of Ozempic causing me to not have an appetite".  Plan: -Will continue the metformin 1000 mg twice daily and Ozempic 1.5 mg weekly -A1c today -Ophthalmology referral order placed last visit -Will begin Jardiance 10 mg -Will need his A1c in 3 months -I recommended that patient speak to his psychiatrist about discontinue Remeron, and provided education on how GLP-1 agonist affect the GI system. Patient is okay with continuing Ozempic at the moment.

## 2023-11-17 NOTE — Assessment & Plan Note (Addendum)
Patient reported symptoms of increasing urinary frequency and "dysuria".  Upon further questioning, patient reports that her pain with urination is not a burning sensation or urethral  but is pain and discomfort from her postherpetic neuralgia of her lumbar spine.  Urine dipstick did show small leukocytes but was negative for nitrites.  I suspect that the patient's increase in urinary frequency is due to hyperglycemia especially since she has been having an increase in urinary frequency for greater than 1 week now.  She is afebrile and does not have chills.  Plan: -Will continue to treat patient's hyperglycemia -Patient was instructed to call the office or be evaluated if she develops pain with urination, fever, chills, worsening urinary frequency, or other symptoms consistent with urinary tract infection.

## 2023-11-17 NOTE — Assessment & Plan Note (Signed)
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  Her PHQ-9 today was elevated at 25 with a prior PHQ-9 of 21 in October.  She is currently following with psychiatry.  She denies any SI/HI at this moment.  Patient also denies plans of suicide. Plan: -Encourage patient to continue follow-up with psychiatry -Does appear that patient is on numerous psychiatric medications through her psychiatry group.  She is currently on Prozac 60 mg, Atarax 25 mg, prazosin 1 mg, trazodone 50 mg, Seroquel 50 mg. -Patient was also recently prescribed Remeron 15 mg from her psychiatrist for appetite stimulation, I recommended she talk to her psychiatrist about discontinuing Remeron and that her decreased appetite from her Ozempic is one of the mechanisms of how Ozempic helps to control her blood glucose.

## 2023-11-17 NOTE — Assessment & Plan Note (Signed)
Patient reports worsening of her postherpetic neuralgia within this past week.  Patient is willing to continue gabapentin 600 mg 3 times daily and try Tylenol for pain regimen.   Plan -Follow-up pain at next visit -Will continue regimen of gabapentin 600 mg 3 times daily

## 2023-11-18 ENCOUNTER — Ambulatory Visit: Payer: MEDICAID | Admitting: Physical Therapy

## 2023-11-18 ENCOUNTER — Encounter: Payer: Self-pay | Admitting: Physical Therapy

## 2023-11-18 ENCOUNTER — Telehealth: Payer: Self-pay

## 2023-11-18 DIAGNOSIS — M25511 Pain in right shoulder: Secondary | ICD-10-CM | POA: Diagnosis not present

## 2023-11-18 DIAGNOSIS — M6281 Muscle weakness (generalized): Secondary | ICD-10-CM

## 2023-11-18 DIAGNOSIS — G8929 Other chronic pain: Secondary | ICD-10-CM

## 2023-11-18 NOTE — Telephone Encounter (Signed)
Decision:Approved  [name] (Key: [id number]) PA Case ID #: [id number] Need Help? Call us at [phone] Outcome Approved today by [employer] 2017 Approved. JARDIANCE 10MG  Tablet is approved from 11/18/2023 to 11/17/2024. All strengths of the drug are approved. Authorization Expiration Date: 11/17/2024 Drug Jardiance 10MG  tablets ePA cloud logo Form [employer] Electronic Prior Authorization Form

## 2023-11-18 NOTE — Telephone Encounter (Signed)
Prior Authorization for patient (Jardiance 10MG  tablets) came through on cover my meds was submitted with last office notes and labs awaiting approval or denial.  ZOX:[id number]

## 2023-11-20 ENCOUNTER — Ambulatory Visit: Payer: MEDICAID | Admitting: Physical Therapy

## 2023-11-20 ENCOUNTER — Encounter: Payer: Self-pay | Admitting: Physical Therapy

## 2023-11-20 DIAGNOSIS — M6281 Muscle weakness (generalized): Secondary | ICD-10-CM

## 2023-11-20 DIAGNOSIS — G8929 Other chronic pain: Secondary | ICD-10-CM

## 2023-11-20 DIAGNOSIS — M25511 Pain in right shoulder: Secondary | ICD-10-CM | POA: Diagnosis not present
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
## 2023-11-20 NOTE — Progress Notes (Signed)
Internal Medicine Clinic Attending  Case discussed with the resident at the time of the visit.  We reviewed the resident's history and exam and pertinent patient test results.  I agree with the assessment, diagnosis, and plan of care documented in the resident's note. Agree with inappropriateness of mirtazapine and advice to discontinue.

## 2023-11-25 ENCOUNTER — Ambulatory Visit: Payer: MEDICAID | Attending: Internal Medicine

## 2023-11-25 DIAGNOSIS — M5416 Radiculopathy, lumbar region: Secondary | ICD-10-CM | POA: Diagnosis present

## 2023-11-25 DIAGNOSIS — G8929 Other chronic pain: Secondary | ICD-10-CM | POA: Insufficient documentation

## 2023-11-25 DIAGNOSIS — M5459 Other low back pain: Secondary | ICD-10-CM | POA: Insufficient documentation

## 2023-11-25 DIAGNOSIS — M6281 Muscle weakness (generalized): Secondary | ICD-10-CM | POA: Insufficient documentation

## 2023-11-25 DIAGNOSIS — M25511 Pain in right shoulder: Secondary | ICD-10-CM | POA: Diagnosis present
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
## 2023-11-26 ENCOUNTER — Encounter (HOSPITAL_COMMUNITY): Payer: Self-pay

## 2023-11-26 ENCOUNTER — Emergency Department (HOSPITAL_COMMUNITY)
Admission: EM | Admit: 2023-11-26 | Discharge: 2023-11-26 | Discharge status: Against Medical Advice | Payer: MEDICAID | Attending: Emergency Medicine | Admitting: Emergency Medicine

## 2023-11-26 ENCOUNTER — Emergency Department (HOSPITAL_COMMUNITY): Payer: MEDICAID

## 2023-11-26 ENCOUNTER — Other Ambulatory Visit: Payer: Self-pay

## 2023-11-26 DIAGNOSIS — I2510 Atherosclerotic heart disease of native coronary artery without angina pectoris: Secondary | ICD-10-CM | POA: Insufficient documentation

## 2023-11-26 DIAGNOSIS — Z79899 Other long term (current) drug therapy: Secondary | ICD-10-CM | POA: Insufficient documentation

## 2023-11-26 DIAGNOSIS — R531 Weakness: Secondary | ICD-10-CM | POA: Diagnosis present

## 2023-11-26 DIAGNOSIS — Z7984 Long term (current) use of oral hypoglycemic drugs: Secondary | ICD-10-CM | POA: Insufficient documentation

## 2023-11-26 DIAGNOSIS — Y900 Blood alcohol level of less than 20 mg/100 ml: Secondary | ICD-10-CM | POA: Diagnosis not present

## 2023-11-26 DIAGNOSIS — R000 Tachycardia, unspecified: Secondary | ICD-10-CM | POA: Insufficient documentation

## 2023-11-26 DIAGNOSIS — Z794 Long term (current) use of insulin: Secondary | ICD-10-CM | POA: Diagnosis not present

## 2023-11-26 DIAGNOSIS — E1165 Type 2 diabetes mellitus with hyperglycemia: Secondary | ICD-10-CM | POA: Insufficient documentation

## 2023-11-26 DIAGNOSIS — R471 Dysarthria and anarthria: Secondary | ICD-10-CM | POA: Insufficient documentation

## 2023-11-26 DIAGNOSIS — Z955 Presence of coronary angioplasty implant and graft: Secondary | ICD-10-CM | POA: Insufficient documentation

## 2023-11-26 DIAGNOSIS — Z5329 Procedure and treatment not carried out because of patient's decision for other reasons: Secondary | ICD-10-CM | POA: Diagnosis not present

## 2023-11-26 DIAGNOSIS — Z7982 Long term (current) use of aspirin: Secondary | ICD-10-CM | POA: Insufficient documentation

## 2023-11-26 DIAGNOSIS — I10 Essential (primary) hypertension: Secondary | ICD-10-CM | POA: Insufficient documentation

## 2023-11-26 LAB — APTT: aPTT: 29 s (ref 24–36)

## 2023-11-26 LAB — DIFFERENTIAL
Abs Immature Granulocytes: 0.04 10*3/uL (ref 0.00–0.07)
Basophils Absolute: 0 10*3/uL (ref 0.0–0.1)
Basophils Relative: 1 %
Eosinophils Absolute: 0.1 10*3/uL (ref 0.0–0.5)
Eosinophils Relative: 1 %
Immature Granulocytes: 1 %
Lymphocytes Relative: 33 %
Lymphs Abs: 2.5 10*3/uL (ref 0.7–4.0)
Monocytes Absolute: 0.5 10*3/uL (ref 0.1–1.0)
Monocytes Relative: 7 %
Neutro Abs: 4.5 10*3/uL (ref 1.7–7.7)
Neutrophils Relative %: 57 %

## 2023-11-26 LAB — CBC
HCT: 36.4 % (ref 36.0–46.0)
Hemoglobin: 11.9 g/dL — ABNORMAL LOW (ref 12.0–15.0)
MCH: 29.2 pg (ref 26.0–34.0)
MCHC: 32.7 g/dL (ref 30.0–36.0)
MCV: 89.4 fL (ref 80.0–100.0)
Platelets: 276 10*3/uL (ref 150–400)
RBC: 4.07 MIL/uL (ref 3.87–5.11)
RDW: 14.3 % (ref 11.5–15.5)
WBC: 7.6 10*3/uL (ref 4.0–10.5)
nRBC: 0 % (ref 0.0–0.2)

## 2023-11-26 LAB — COMPREHENSIVE METABOLIC PANEL
ALT: 21 U/L (ref 0–44)
AST: 21 U/L (ref 15–41)
Albumin: 3.9 g/dL (ref 3.5–5.0)
Alkaline Phosphatase: 58 U/L (ref 38–126)
Anion gap: 13 (ref 5–15)
BUN: 5 mg/dL — ABNORMAL LOW (ref 6–20)
CO2: 21 mmol/L — ABNORMAL LOW (ref 22–32)
Calcium: 9.7 mg/dL (ref 8.9–10.3)
Chloride: 104 mmol/L (ref 98–111)
Creatinine, Ser: 0.81 mg/dL (ref 0.44–1.00)
GFR, Estimated: >60 mL/min (ref >60)
Glucose, Bld: 191 mg/dL — ABNORMAL HIGH (ref 70–99)
Potassium: 3.7 mmol/L (ref 3.5–5.1)
Sodium: 138 mmol/L (ref 135–145)
Total Bilirubin: 0.4 mg/dL (ref 0.0–1.2)
Total Protein: 7.2 g/dL (ref 6.5–8.1)

## 2023-11-26 LAB — I-STAT CHEM 8, ED
BUN: 5 mg/dL — ABNORMAL LOW (ref 6–20)
Calcium, Ion: 1.21 mmol/L (ref 1.15–1.40)
Chloride: 107 mmol/L (ref 98–111)
Creatinine, Ser: 0.7 mg/dL (ref 0.44–1.00)
Glucose, Bld: 187 mg/dL — ABNORMAL HIGH (ref 70–99)
HCT: 36 % (ref 36.0–46.0)
Hemoglobin: 12.2 g/dL (ref 12.0–15.0)
Potassium: 3.8 mmol/L (ref 3.5–5.1)
Sodium: 142 mmol/L (ref 135–145)
TCO2: 22 mmol/L (ref 22–32)

## 2023-11-26 LAB — ETHANOL: Alcohol, Ethyl (B): <10 mg/dL (ref <10)

## 2023-11-26 LAB — PROTIME-INR
INR: 1 (ref 0.8–1.2)
Prothrombin Time: 13.4 s (ref 11.4–15.2)

## 2023-11-26 LAB — TROPONIN I (HIGH SENSITIVITY): Troponin I (High Sensitivity): 3 ng/L (ref <18)

## 2023-11-26 LAB — CBG MONITORING, ED: Glucose-Capillary: 173 mg/dL — ABNORMAL HIGH (ref 70–99)

## 2023-11-26 MED ORDER — ACETAMINOPHEN 500 MG PO TABS
1000.0000 mg | ORAL_TABLET | Freq: Once | ORAL | Status: DC
  Filled 2023-11-26: qty 2

## 2023-11-26 MED ORDER — GABAPENTIN 300 MG PO CAPS: 300.0000 mg | ORAL_CAPSULE | Freq: Once | ORAL | Status: DC

## 2023-11-26 MED ORDER — LORAZEPAM 1 MG PO TABS: 1.0000 mg | ORAL_TABLET | Freq: Once | ORAL | Status: DC

## 2023-11-26 MED ORDER — METOCLOPRAMIDE HCL 5 MG/ML IJ SOLN
10.0000 mg | Freq: Once | INTRAMUSCULAR | Status: AC
  Administered 2023-11-26: 10 mg via INTRAVENOUS
  Filled 2023-11-26: qty 2

## 2023-11-26 MED ORDER — KETOROLAC TROMETHAMINE 15 MG/ML IJ SOLN
15.0000 mg | Freq: Once | INTRAMUSCULAR | Status: AC
Start: 2023-11-26 — End: 2023-11-26
  Administered 2023-11-26: 15 mg via INTRAVENOUS
  Filled 2023-11-26: qty 1

## 2023-11-26 MED ORDER — SODIUM CHLORIDE 0.9% FLUSH: 3.0000 mL | Freq: Once | INTRAVENOUS | Status: DC

## 2023-11-26 MED ORDER — IOHEXOL 350 MG/ML SOLN
100.0000 mL | Freq: Once | INTRAVENOUS | Status: AC | PRN
  Administered 2023-11-26: 100 mL via INTRAVENOUS

## 2023-11-26 NOTE — Evaluation (Signed)
 Clinical/Bedside Swallow Evaluation Patient Details  Name: Vanessa Case MRN: [id number] Date of Birth: May 20, 1971  Today's Date: 11/26/2023 Time: SLP Start Time (ACUTE ONLY): 1240 SLP Stop Time (ACUTE ONLY): 1311 SLP Time Calculation (min) (ACUTE ONLY): 31 min  Past Medical History:  Past Medical History:  Diagnosis Date   Anemia    Anxiety    Arthritis    Cholelithiasis with acute cholecystitis 06/28/2013   Chronic abdominal pain    Chronic back pain    Chronic headache    Chronic neck pain    Coronary artery disease    Depression    Diabetes mellitus    adult onset dm-maintained on glipizide  and metformin , pt states fasting glucose runs 110   Fibromyalgia    GERD (gastroesophageal reflux disease)    HLD (hyperlipidemia) 05/30/2015   Hyperlipidemia    Hypertension    maintained on lisinopril  hct, metoprolol -134/86 at pat visit   Neuromuscular disorder (HCC)    Neuropathy    NSTEMI (non-ST elevated myocardial infarction) (HCC) 02/05/2020   Obesity    PTSD (post-traumatic stress disorder)    Right arm weakness 03/05/2013   Substance abuse (HCC)    Past Surgical History:  Past Surgical History:  Procedure Laterality Date   ABDOMINAL HYSTERECTOMY  10/30/2011   Procedure: HYSTERECTOMY ABDOMINAL;  Surgeon: Debby JULIANNA Lares, MD;  Location: [facility];  Service: Gynecology;  Laterality: N/A;   CESAREAN SECTION  x3   CHOLECYSTECTOMY N/A 06/28/2013   Procedure: LAPAROSCOPIC CHOLECYSTECTOMY;  Surgeon: Vicenta DELENA Poli, MD;  Location: [facility];  Service: General;  Laterality: N/A;   CORONARY STENT INTERVENTION N/A 02/07/2020   Procedure: CORONARY STENT INTERVENTION;  Surgeon: Anner Alm ORN, MD;  Location: [facility] INVASIVE CV LAB;  Service: Cardiovascular;  Laterality: N/A;   LEFT HEART CATH AND CORONARY ANGIOGRAPHY N/A 02/07/2020   Procedure: LEFT HEART CATH AND CORONARY ANGIOGRAPHY;  Surgeon: Anner Alm ORN, MD;  Location: [facility] INVASIVE CV LAB;  Service: Cardiovascular;  Laterality:  N/A;   SALPINGOOPHORECTOMY  10/30/2011   Procedure: SALPINGO OOPHERECTOMY;  Surgeon: Debby JULIANNA Lares, MD;  Location: [facility];  Service: Gynecology;  Laterality: Right;   WRIST SURGERY     carpel tunnel and tendonitis   HPI:  Pt is a 53 yo female presenting 2/5 with CP and subsequent acute onset L weakness, dizziness, slurred speech. CT Head negative; MRI pending. PMH includes: GERD, anxiety, DM, neuromuscular disorder, NSTEMI, PTSD, substance abuse, HTN, CAD, bipolar disorder, fibromyalgia    Assessment / Plan / Recommendation  Clinical Impression  Pt has reduced facial ROM bilaterally during oral motor exam, and although she protrudes her tongue symmetrically, she cannot elevate the tip of it. She reports this to be her baseline though, and she feels like her acute symptoms have resolved overall. She does endorse a h/o dysphagia that sounds esophageal in nature, with symptoms that include gagging, regurgitation, and heartburn that has been exacerbated over the last few weeks. She says that it has gotten to the point that she feels like she cannot get her pills down and she cannot use applesauce the way she normally would to crush her pills, because this gives her such bad heartburn. She describes frequent vomiting after meals as well as a lot of abdominal bloating. From an oropharyngeal standpoint, she seems appropriate to start a regular diet and thin liquids. Given concern for acute worsening of more chronic esophageal symptoms that are interfering with her ability to take her medications, recommend considering GI follow up. SLP  Visit Diagnosis: Dysphagia, unspecified (R13.10)    Aspiration Risk  Mild aspiration risk    Diet Recommendation Regular;Thin liquid    Liquid Administration via: Cup;Straw Medication Administration: Other (Comment) (per pt preference) Supervision: Patient able to self feed;Intermittent supervision to cue for compensatory strategies Compensations: Slow rate;Small  sips/bites;Follow solids with liquid Postural Changes: Seated upright at 90 degrees;Remain upright for at least 30 minutes after po intake    Other  Recommendations Recommended Consults: Consider GI evaluation;Consider esophageal assessment Oral Care Recommendations: Oral care BID    Recommendations for follow up therapy are one component of a multi-disciplinary discharge planning process, led by the attending physician.  Recommendations may be updated based on patient status, additional functional criteria and insurance authorization.  Follow up Recommendations No SLP follow up      Assistance Recommended at Discharge    Functional Status Assessment Patient has had a recent decline in their functional status and demonstrates the ability to make significant improvements in function in a reasonable and predictable amount of time.  Frequency and Duration min 1 x/week  1 week       Prognosis        Swallow Study   General HPI: Pt is a 53 yo female presenting 2/5 with CP and subsequent acute onset L weakness, dizziness, slurred speech. CT Head negative; MRI pending. PMH includes: GERD, anxiety, DM, neuromuscular disorder, NSTEMI, PTSD, substance abuse, HTN, CAD, bipolar disorder, fibromyalgia Type of Study: Bedside Swallow Evaluation Previous Swallow Assessment: none in chart Diet Prior to this Study: NPO Temperature Spikes Noted: No Respiratory Status: Room air History of Recent Intubation: No Behavior/Cognition: Alert;Cooperative;Pleasant mood Oral Cavity Assessment: Within Functional Limits Oral Care Completed by SLP: No Oral Cavity - Dentition: Adequate natural dentition Vision: Functional for self-feeding Self-Feeding Abilities: Able to feed self Patient Positioning: Upright in bed Baseline Vocal Quality: Normal Volitional Cough: Strong Volitional Swallow: Able to elicit    Oral/Motor/Sensory Function Overall Oral Motor/Sensory Function: Other (comment) (Pt with reduced ROM  bilaterally at her mouth but without overt facial droop. She protrudes her tongue symmetrically but cannot elevate the tip of her tongue. She says that this is normal for her.)   Ice Chips Ice chips: Not tested   Thin Liquid Thin Liquid: Within functional limits Presentation: Cup;Self Fed;Straw    Nectar Thick Nectar Thick Liquid: Not tested   Honey Thick Honey Thick Liquid: Not tested   Puree Puree: Within functional limits Presentation: Self Fed;Spoon   Solid     Solid: Within functional limits Presentation: Self Fed      Leita SAILOR., M.A. CCC-SLP Acute Rehabilitation Services Office [phone]  Secure chat preferred  11/26/2023,1:33 PM

## 2023-11-26 NOTE — ED Triage Notes (Addendum)
 Pt arrived via [facility] from home as a code stroke. Pt states she woke up at 0200 today with chest pain, but was not having stroke sx. Pt states she went back to sleep at 0430 then woke up again at 0700 and sat up in bed and fell out of bed. Pt states she felt dizzy and had a syncopal episode. Pt states her daughter came in her room when she heard the pt fell and noticed pt had slurred speech and pt states her face felt heavy on left side at that time. Pt states she had int dizziness yesterday. Pt c/o left sided weakness, left facial droop and slurred speech. Pt's LKW 0200 today. Pt is A&Ox4. Pt c/o left hip pain from fall this morning

## 2023-11-26 NOTE — ED Provider Notes (Signed)
 [place] [facility]PARTMENT AT [facility] Provider Note   CSN: [id number] Arrival date & time: 11/26/23  9086  An emergency department physician performed an initial assessment on this suspected stroke patient at 0913.  History  Chief Complaint  Patient presents with   Code Stroke    Vanessa Case is a 53 y.o. female.  Patient is a 53 year old female with a past medical history of hypertension, CAD, diabetes, bipolar disorder, fibromyalgia presenting to the emergency department with as a code stroke.  Patient states that she woke up around 2 AM to get a glass of water  and felt off but states that she did not feel any weakness.  She states that she did have some chest tightness and had some pain with taking deep breaths but denies feeling short of breath.  She states that she went back to bed and then woke up to her alarm this morning to get her daughter ready for school and when she called out to her daughter her daughter noted that her speech was slurred.  She states that she tried to get out of bed and fell onto her left side because her left side was weak as it was when her daughter called 911.  She states that she has had chest pain still on and off this morning.  She denies any recent fever or cough or lower extremity swelling.  Patient also reports that she has had on and off headaches over the last few weeks.  She states this started after getting a cortisone injection in her shoulder for rotator cuff injury.  She states that her blood pressures have been running high as well and her primary doctor did make some medication changes but she states that her blood pressure has still been high which she thinks may be contributing to the headaches.  She states that she does have a headache this morning.  The history is provided by the patient and the EMS personnel.       Home Medications Prior to Admission medications   Medication Sig Start Date End Date Taking?  Authorizing Provider  amLODipine  (NORVASC ) 5 MG tablet Take 1 tablet (5 mg total) by mouth daily. 11/17/23 11/16/24 Yes Bender, Damien, DO  aspirin  EC 81 MG EC tablet Take 1 tablet (81 mg total) by mouth daily. 02/09/20  Yes Henry Manuelita NOVAK, NP  empagliflozin  (JARDIANCE ) 10 MG TABS tablet Take 1 tablet (10 mg total) by mouth daily before breakfast. 11/17/23  Yes Bender, Damien, DO  ezetimibe  (ZETIA ) 10 MG tablet Take 1 tablet (10 mg total) by mouth daily. 06/16/23  Yes Croitoru, Mihai, MD  FLUoxetine  (PROZAC ) 20 MG capsule Take 3 capsules (60 mg total) by mouth daily. 08/13/23  Yes Izella Ismael NOVAK, MD  fluticasone  (FLOVENT  HFA) 44 MCG/ACT inhaler Inhale 2 puffs into the lungs 2 (two) times daily as needed (for shortness of breath). 03/14/16  Yes Patsey Lot, MD  gabapentin  (NEURONTIN ) 300 MG capsule Take 2 capsules (600 mg total) by mouth 3 (three) times daily. TAKE 2 CAPSULE BY MOUTH IN THE MORNING, 2 CAPSULE IN THE EVENING, AND 2 CAPSULES AT NIGHT 10/17/23 01/15/24 Yes Fernand Prost, MD  hydrOXYzine  (ATARAX ) 25 MG tablet Take 1 tablet (25 mg total) by mouth daily. Patient taking differently: Take 25 mg by mouth daily as needed. 08/13/23  Yes Izella Ismael NOVAK, MD  irbesartan  (AVAPRO ) 300 MG tablet Take 1 tablet (300 mg total) by mouth daily. 08/15/23 08/14/24 Yes Nooruddin, Saad, MD  metFORMIN  (GLUCOPHAGE ) 1000 MG tablet Take 1 tablet (1,000 mg total) by mouth 2 (two) times daily with a meal. 09/22/23  Yes Nooruddin, Roetta, MD  methocarbamol  (ROBAXIN ) 500 MG tablet Take 1 tablet (500 mg total) by mouth daily. 10/10/23  Yes Fernand Prost, MD  Multiple Vitamins-Calcium  (ONE-A-DAY WOMENS FORMULA PO) Take 1 tablet by mouth daily.   Yes [provider]  naltrexone  (DEPADE) 50 MG tablet Take 50 mg by mouth daily. For urges and cravings   Yes [provider]  prazosin  (MINIPRESS ) 1 MG capsule Take 1 capsule (1 mg total) by mouth at bedtime. To prevent nightmares 08/13/23  Yes Izella Ismael NOVAK, MD  QUEtiapine  (SEROQUEL ) 50 MG tablet Take 1 tablet (50 mg total) by mouth at bedtime. 08/27/23  Yes Kober, Charles E, PA-C  Semaglutide , 1 MG/DOSE, 4 MG/3ML SOPN Inject 1 mg into the skin once a week. 09/01/23  Yes Nooruddin, Saad, MD  traZODone  (DESYREL ) 50 MG tablet Take 50 mg by mouth at bedtime as needed for sleep.   Yes [provider]  Blood Pressure Monitoring (BLOOD PRESSURE CUFF) MISC Blood pressure cuff 06/04/23   Croitoru, Mihai, MD  mirtazapine  (REMERON ) 15 MG tablet Take 1 tablet (15 mg total) by mouth at bedtime. Patient not taking: Reported on 11/26/2023 08/13/23   Izella Ismael NOVAK, MD      Allergies    Percocet [oxycodone -acetaminophen ] and Ambien  [zolpidem ]    Review of Systems   Review of Systems  Physical Exam Updated Vital Signs BP (!) 143/92   Pulse (!) 107   Temp 100 F (37.8 C) (Oral)   Resp (!) 23   Wt 81.6 kg   LMP 10/07/2011   SpO2 100%   BMI 29.94 kg/m  Physical Exam Vitals and nursing note reviewed.  Constitutional:      General: She is not in acute distress.    Appearance: Normal appearance.  HENT:     Head: Normocephalic and atraumatic.     Nose: Nose normal.     Mouth/Throat:     Mouth: Mucous membranes are moist.     Pharynx: Oropharynx is clear.  Eyes:     Extraocular Movements: Extraocular movements intact.     Conjunctiva/sclera: Conjunctivae normal.     Pupils: Pupils are equal, round, and reactive to light.  Cardiovascular:     Rate and Rhythm: Regular rhythm. Tachycardia present.     Heart sounds: Normal heart sounds.  Pulmonary:     Effort: Pulmonary effort is normal.     Breath sounds: Normal breath sounds.  Abdominal:     General: Abdomen is flat.     Palpations: Abdomen is soft.     Tenderness: There is no abdominal tenderness.  Musculoskeletal:        General: No tenderness. Normal range of motion.     Cervical back: Normal range of motion.     Right lower leg: No edema.     Left lower leg: No edema.   Skin:    General: Skin is warm and dry.  Neurological:     Mental Status: She is alert and oriented to person, place, and time.     Comments: Mild dysarthria Mild L-sided facial droop, though does appear somewhat limited due to effort Mild drift in LUE and LLE Subjectively decreased sensation on L-side of face, LUE and LLE Normal finger to nose bilaterally  Psychiatric:        Mood and Affect: Mood normal.  Behavior: Behavior normal.     ED Results / Procedures / Treatments   Labs (all labs ordered are listed, but only abnormal results are displayed) Labs Reviewed  CBC - Abnormal; Notable for the following components:      Result Value   Hemoglobin 11.9 (*)    All other components within normal limits  COMPREHENSIVE METABOLIC PANEL - Abnormal; Notable for the following components:   CO2 21 (*)    Glucose, Bld 191 (*)    BUN 5 (*)    All other components within normal limits  I-STAT CHEM 8, ED - Abnormal; Notable for the following components:   BUN 5 (*)    Glucose, Bld 187 (*)    All other components within normal limits  CBG MONITORING, ED - Abnormal; Notable for the following components:   Glucose-Capillary 173 (*)    All other components within normal limits  PROTIME-INR  APTT  DIFFERENTIAL  ETHANOL  TROPONIN I (HIGH SENSITIVITY)    EKG EKG Interpretation Date/Time:  Wednesday November 26 2023 09:49:12 EST Ventricular Rate:  108 PR Interval:  135 QRS Duration:  80 QT Interval:  337 QTC Calculation: 452 R Axis:   56  Text Interpretation: Sinus tachycardia Borderline repolarization abnormality Borderline ST elevation, lateral leads Artifact Otherwise no significant change Confirmed by Ellouise Fine ([id number]) on 11/26/2023 9:55:01 AM  Radiology DG Chest Port 1 View Result Date: 11/26/2023 CLINICAL DATA:  Chest pain. EXAM: PORTABLE CHEST 1 VIEW COMPARISON:  10/13/2023 FINDINGS: The heart size and mediastinal contours are within normal limits. Both lungs  are clear. The visualized skeletal structures are unremarkable. IMPRESSION: No active disease. Electronically Signed   By: Elspeth Bathe M.D.   On: 11/26/2023 11:48   CT ANGIO HEAD NECK W WO CM W PERF (CODE STROKE) Result Date: 11/26/2023 CLINICAL DATA:  Provided history: Neuro deficit, acute, stroke suspected. Left-sided weakness. Left-sided facial droop. Slurred speech. EXAM: CT ANGIOGRAPHY HEAD AND NECK CT PERFUSION BRAIN TECHNIQUE: Multidetector CT imaging of the head and neck was performed using the standard protocol during bolus administration of intravenous contrast. Multiplanar CT image reconstructions and MIPs were obtained to evaluate the vascular anatomy. Carotid stenosis measurements (when applicable) are obtained utilizing NASCET criteria, using the distal internal carotid diameter as the denominator. Multiphase CT imaging of the brain was performed following IV bolus contrast injection. Subsequent parametric perfusion maps were calculated using RAPID software. RADIATION DOSE REDUCTION: This exam was performed according to the departmental dose-optimization program which includes automated exposure control, adjustment of the mA and/or kV according to patient size and/or use of iterative reconstruction technique. CONTRAST:  OMNIPAQUE  IOHEXOL  350 MG/ML SOLN COMPARISON:  Noncontrast head CT performed earlier today 11/26/2023. FINDINGS: CTA NECK FINDINGS Aortic arch: Standard aortic branching. The visualized thoracic aorta is normal in caliber. Streak/beam hardening artifact arising from a dense left-sided contrast bolus partially obscures the left subclavian artery. Within this limitation, there is no appreciable hemodynamically significant innominate or proximal subclavian artery stenosis. Right carotid system: CCA and ICA patent within the neck without hemodynamically significant stenosis (50% or greater). Mild atherosclerotic plaque within the proximal ICA. Left carotid system: CCA and ICA patent  within the neck without stenosis or significant atherosclerotic disease. Vertebral arteries: Codominant and patent within the neck without stenosis or significant atherosclerotic disease. Skeleton: Poor dentition. No acute fracture or aggressive osseous lesion elsewhere. Other neck: Unremarkable. Upper chest: No consolidation within the imaged lung apices. Review of the MIP images confirms the above findings CTA  HEAD FINDINGS Anterior circulation: The intracranial internal carotid arteries are patent. Atherosclerotic plaque within the cavernous segment on the right with mild stenosis. The M1 middle cerebral arteries are patent. No M2 proximal branch occlusion or high-grade proximal stenosis. The anterior cerebral arteries are patent. No intracranial aneurysm is identified. Posterior circulation: The intracranial vertebral arteries are patent. The basilar artery is patent. The posterior cerebral arteries are patent. Posterior communicating arteries are diminutive or absent, bilaterally. Venous sinuses: Within the limitations of contrast timing, no convincing thrombus. Anatomic variants: As described. Review of the MIP images confirms the above findings CT Brain Perfusion Findings: CBF (<30%) Volume: 0mL Perfusion (Tmax>6.0s) volume: 0mL Mismatch Volume: 0mL Infarction Location:None identified. No emergent large vessel occlusion identified. These results were called by telephone at the time of interpretation on 11/26/2023 at 9:54 am to provider ERIC [facility] , who verbally acknowledged these results. IMPRESSION: CTA neck: The common carotid, internal carotid and vertebral are patent within the neck without hemodynamically significant stenosis. Mild atherosclerotic plaque within the proximal right ICA. CTA head: 1. No proximal intracranial large vessel occlusion identified. 2. Atherosclerotic plaque within the cavernous right internal carotid artery with mild stenosis. CT perfusion head: The perfusion software identifies  no core infarct. The perfusion software identifies no critically hypoperfused parenchyma (utilizing the Tmax>6 seconds threshold). No mismatch volume reported. Electronically Signed   By: Rockey Childs D.O.   On: 11/26/2023 10:13   CT HEAD CODE STROKE WO CONTRAST Result Date: 11/26/2023 CLINICAL DATA:  Code stroke. Neuro deficit, acute, stroke suspected. EXAM: CT HEAD WITHOUT CONTRAST TECHNIQUE: Contiguous axial images were obtained from the base of the skull through the vertex without intravenous contrast. RADIATION DOSE REDUCTION: This exam was performed according to the departmental dose-optimization program which includes automated exposure control, adjustment of the mA and/or kV according to patient size and/or use of iterative reconstruction technique. COMPARISON:  Noncontrast head CT 04/09/2022. FINDINGS: Brain: Cerebral volume is normal. Partially empty sella turcica. There is no acute intracranial hemorrhage. No demarcated cortical infarct. No extra-axial fluid collection. No evidence of an intracranial mass. No midline shift. Vascular: No hyperdense vessel. Skull: No calvarial fracture or aggressive osseous lesion. Sinuses/Orbits: No mass or acute finding within the imaged orbits. Mild left ethmoid sinusitis. ASPECTS [facility] Stroke Program Early CT Score) - Ganglionic level infarction (caudate, lentiform nuclei, internal capsule, insula, M1-M3 cortex): 7 - Supraganglionic infarction (M4-M6 cortex): 3 Total score (0-10 with 10 being normal): 10 No evidence of an acute intracranial abnormality. These results were called by telephone at the time of interpretation on 11/26/2023 at 9:54 am to provider Dr. Merrianne, who verbally acknowledged these results. IMPRESSION: 1.  No evidence of an acute intracranial abnormality. ASPECTS is 10. 2. Partially empty sella turcica. This finding can reflect incidental anatomic variation, or alternatively, it can be associated with chronic idiopathic intracranial hypertension  (pseudotumor cerebri). Electronically Signed   By: Rockey Childs D.O.   On: 11/26/2023 09:54    Procedures Procedures    Medications Ordered in ED Medications  sodium chloride  flush (NS) 0.9 % injection 3 mL (has no administration in time range)  acetaminophen  (TYLENOL ) tablet 1,000 mg (0 mg Oral Hold 11/26/23 1115)  LORazepam  (ATIVAN ) tablet 1 mg (has no administration in time range)  gabapentin  (NEURONTIN ) capsule 300 mg (0 mg Oral Hold 11/26/23 1115)  iohexol  (OMNIPAQUE ) 350 MG/ML injection 100 mL (100 mLs Intravenous Contrast Given 11/26/23 0941)  ketorolac  (TORADOL ) 15 MG/ML injection 15 mg (15 mg Intravenous Given 11/26/23 1108)  metoCLOPramide  (REGLAN ) injection 10 mg (10 mg Intravenous Given 11/26/23 1108)    ED Course/ Medical Decision Making/ A&P Clinical Course as of 11/26/23 1507  Wed Nov 26, 2023  1045 I spoke with Dr. Merrianne who suspects likely conversion disorder. She is outside of the window for TNK and no LVO seen on CT for thrombectomy. If patient's symptoms resolved, could be discharged otherwise recommends admission. Patient is having headaches and will be given migraine cocktail for possible complex migraine.  [VK]  1203 Initial troponin negative. Symptoms ongoing since 2AM so single troponin is sufficient. [VK]  1327 Patient standing in the doorway, ambulating in the room. Waiting on MRI. [VK]  1400 RN notified me patient would like to leave against medical advice. RN explained risk to patient, patient left prior to my conversation. [VK]    Clinical Course User Index [VK] Kingsley, Graysen Woodyard K, DO                                 Medical Decision Making This patient presents to the ED with chief complaint(s) of L=sided weakness, dysarthria with pertinent past medical history of HTN, DM, CAD, bipolar disorder, fibromyalgia which further complicates the presenting complaint. The complaint involves an extensive differential diagnosis and also carries with it a high risk of  complications and morbidity.    The differential diagnosis includes CVA, TIA, ICH, mass effect, complex migraine, conversion disorder, hypo or hyperglycemia, ACS, arrhythmia, pneumonia, pneumothorax, pulmonary edema, pleural effusion, infection  Additional history obtained: Additional history obtained from EMS  Records reviewed Primary Care Documents  ED Course and Reassessment: Patient was made a prehospital arrival code stroke and was immediately evaluated by neurology and myself at arrival.  Patient's airway was intact.  Initial Accu-Chek was normal and she was immediately transported to CT scanner.  With patient's associated chest pain will additionally have troponin and chest x-ray in addition to her stroke workup and will be closely reassessed.  Independent labs interpretation:  The following labs were independently interpreted: within normal range  Independent visualization of imaging: - I independently visualized the following imaging with scope of interpretation limited to determining acute life threatening conditions related to emergency care: CTH/CTA, which revealed no acute disease  Consultation: - Consulted or discussed management/test interpretation w/ external professional: neurology  Consideration for admission or further workup: considered MRI however patient left AMA Social Determinants of health: N/A    Amount and/or Complexity of Data Reviewed Labs: ordered. Radiology: ordered.  Risk OTC drugs. Prescription drug management.          Final Clinical Impression(s) / ED Diagnoses Final diagnoses:  Left-sided weakness  Left against medical advice    Rx / DC Orders ED Discharge Orders     None         Kingsley, Suheyb Raucci K, DO 11/26/23 1507

## 2023-11-26 NOTE — Code Documentation (Signed)
 Stroke Response Nurse Documentation Code Documentation  Dalayah Lucila Klecka is a 53 y.o. female arriving to [facility]  via [facility] on 2/5 with past medical hx of Htn, bipolar, MDD, NSTEMI, PTSD. On No antithrombotic. Code stroke was activated by EMS.   Patient from home where she was LKW at 2300 before bed last night and now complaining of L sided weakness, slurred speech, facial droop. She woke up at 0200 with chest pain, but went back to sleep and woke up at 0700 with dizziness and had a syncopal episode, causing her to fall out of bed onto the floor. She was found by the daughter at that time who called EMS.    Stroke team at the bedside on patient arrival. Labs drawn and patient cleared for CT by Dr. Ellouise. Patient to CT with team. NIHSS 5, see documentation for details and code stroke times. Patient with left hemianopia, left facial droop, left decreased sensation, and dysarthria  on exam. The following imaging was completed:  CT Head, CTA, and CTP. Patient is not a candidate for IV Thrombolytic due to LKW 2300 yesterday. Patient is not a candidate for IR due to no LVO on imaging per MD.   Care Plan: q2h NIHSS and vitals x 12 hours, then q4.   Bedside handoff with ED RN Alana.    Lauraine LITTIE Searle  Stroke Response RN

## 2023-11-26 NOTE — Consult Note (Signed)
 NEUROLOGY CONSULT NOTE   Date of service: November 26, 2023 Patient Name: Vanessa Case MRN:  [id number] DOB:  Jan 04, 1971 Chief Complaint: Left sided weakness Requesting Provider: Ellouise Richerd POUR, DO  History of Present Illness  Vanessa Case is a 53 y.o. female with a PMHx of anemia, CAD s/p stenting, anxiety, chronic pain (abdominal, back and headaches), depression, DM, fibromyalgia, HLD, GERD, HTN, neuromuscular disorder, neuropathy, obesity, PTSD and substance abuse (history of cocaine and MJ use) who presents from home to the ED via EMS as a Code Stroke. LKN was 11:00 PM yesterday night. She then started having chest pain and did not feel normal, but is unable to further specify. She then went to bed at 2 AM. On awakening at 0700, she had slurred speech. She then syncopized at 0720. On coming to, she had left sided weakness, continued slurred speech and left facial droop. She is taking ASA regularly. Formerly was on Brilinta  for prior MI.   LKW: 11:00 PM Modified rankin score: 0-Completely asymptomatic and back to baseline post- stroke IV Thrombolysis: No: Out of the time window EVT:  Not a thrombectomy candidate due to no LVO   NIHSS components Score: Comment  1a Level of Conscious 0[x]  1[]  2[]  3[]      1b LOC Questions 0[x]  1[]  2[]       1c LOC Commands 0[x]  1[]  2[]       2 Best Gaze 0[x]  1[]  2[]       3 Visual 0[]  1[]  2[x]  3[]      4 Facial Palsy 0[]  1[x]  2[]  3[]      5a Motor Arm - left 0[x]  1[]  2[]  3[]  4[]  UN[]    5b Motor Arm - Right 0[x]  1[]  2[]  3[]  4[]  UN[]    6a Motor Leg - Left 0[x]  1[]  2[]  3[]  4[]  UN[]    6b Motor Leg - Right 0[x]  1[]  2[]  3[]  4[]  UN[]    7 Limb Ataxia 0[x]  1[]  2[]  3[]  UN[]     8 Sensory 0[]  1[x]  2[]  UN[]      9 Best Language 0[x]  1[]  2[]  3[]      10 Dysarthria 0[]  1[x]  2[]  UN[]      11 Extinct. and Inattention 0[x]  1[]  2[]       TOTAL:    5      ROS  Complains of a headache. Other symptoms as per HPI. Detailed ROS deferred due to acuity of  presentation.    Past History   Past Medical History:  Diagnosis Date   Anemia    Anxiety    Arthritis    Cholelithiasis with acute cholecystitis 06/28/2013   Chronic abdominal pain    Chronic back pain    Chronic headache    Chronic neck pain    Coronary artery disease    Depression    Diabetes mellitus    adult onset dm-maintained on glipizide  and metformin , pt states fasting glucose runs 110   Fibromyalgia    GERD (gastroesophageal reflux disease)    HLD (hyperlipidemia) 05/30/2015   Hyperlipidemia    Hypertension    maintained on lisinopril  hct, metoprolol -134/86 at pat visit   Neuromuscular disorder [facility])    Neuropathy    NSTEMI (non-ST elevated myocardial infarction) (HCC) 02/05/2020   Obesity    PTSD (post-traumatic stress disorder)    Right arm weakness 03/05/2013   Substance abuse (HCC)    Past Surgical History:  Procedure Laterality Date   ABDOMINAL HYSTERECTOMY  10/30/2011   Procedure: HYSTERECTOMY ABDOMINAL;  Surgeon: Debby JULIANNA Lares, MD;  Location: [facility];  Service: Gynecology;  Laterality: N/A;   CESAREAN SECTION  x3   CHOLECYSTECTOMY N/A 06/28/2013   Procedure: LAPAROSCOPIC CHOLECYSTECTOMY;  Surgeon: Vicenta DELENA Poli, MD;  Location: [facility] OR;  Service: General;  Laterality: N/A;   CORONARY STENT INTERVENTION N/A 02/07/2020   Procedure: CORONARY STENT INTERVENTION;  Surgeon: Anner Alm ORN, MD;  Location: [facility] INVASIVE CV LAB;  Service: Cardiovascular;  Laterality: N/A;   LEFT HEART CATH AND CORONARY ANGIOGRAPHY N/A 02/07/2020   Procedure: LEFT HEART CATH AND CORONARY ANGIOGRAPHY;  Surgeon: Anner Alm ORN, MD;  Location: [facility] INVASIVE CV LAB;  Service: Cardiovascular;  Laterality: N/A;   SALPINGOOPHORECTOMY  10/30/2011   Procedure: SALPINGO OOPHERECTOMY;  Surgeon: Debby JULIANNA Lares, MD;  Location: [facility];  Service: Gynecology;  Laterality: Right;   WRIST SURGERY     carpel tunnel and tendonitis    Family History: Family History  Problem Relation Age of Onset    Diabetes Mother    Atrial fibrillation Mother    Cystic fibrosis Father    Diabetes Maternal Grandmother    Heart disease Maternal Grandfather     Social History  reports that she quit smoking about 3 years ago. Her smoking use included cigarettes. She has never used smokeless tobacco. She reports that she does not currently use alcohol . She reports current drug use. Drugs: Cocaine, Marijuana, and Crack cocaine.  Allergies  Allergen Reactions   Percocet [Oxycodone -Acetaminophen ] Hives, Itching, Palpitations and Other (See Comments)    nightmares   Ambien  [Zolpidem ] Other (See Comments)    Sleepwalking episodes    Medications   Current Facility-Administered Medications:    sodium chloride  flush (NS) 0.9 % injection 3 mL, 3 mL, Intravenous, Once, Kingsley, Victoria K, DO  Current Outpatient Medications:    amLODipine  (NORVASC ) 5 MG tablet, Take 1 tablet (5 mg total) by mouth daily., Disp: 30 tablet, Rfl: 11   aspirin  EC 81 MG EC tablet, Take 1 tablet (81 mg total) by mouth daily., Disp: 90 tablet, Rfl: 2   Blood Pressure Monitoring (BLOOD PRESSURE CUFF) MISC, Blood pressure cuff, Disp: 1 each, Rfl: 0   empagliflozin  (JARDIANCE ) 10 MG TABS tablet, Take 1 tablet (10 mg total) by mouth daily before breakfast., Disp: 30 tablet, Rfl: 2   ezetimibe  (ZETIA ) 10 MG tablet, Take 1 tablet (10 mg total) by mouth daily., Disp: 90 tablet, Rfl: 3   FLUoxetine  (PROZAC ) 20 MG capsule, Take 3 capsules (60 mg total) by mouth daily., Disp: 90 capsule, Rfl: 1   fluticasone  (FLOVENT  HFA) 44 MCG/ACT inhaler, Inhale 2 puffs into the lungs 2 (two) times daily as needed (for shortness of breath)., Disp: 1 Inhaler, Rfl: 0   gabapentin  (NEURONTIN ) 300 MG capsule, Take 2 capsules (600 mg total) by mouth 3 (three) times daily. TAKE 2 CAPSULE BY MOUTH IN THE MORNING, 2 CAPSULE IN THE EVENING, AND 2 CAPSULES AT NIGHT, Disp: 270 capsule, Rfl: 1   hydrOXYzine  (ATARAX ) 25 MG tablet, Take 1 tablet (25 mg total) by mouth  daily., Disp: 30 tablet, Rfl: 1   irbesartan  (AVAPRO ) 300 MG tablet, Take 1 tablet (300 mg total) by mouth daily., Disp: 90 tablet, Rfl: 3   metFORMIN  (GLUCOPHAGE ) 1000 MG tablet, Take 1 tablet (1,000 mg total) by mouth 2 (two) times daily with a meal., Disp: 90 tablet, Rfl: 3   methocarbamol  (ROBAXIN ) 500 MG tablet, Take 1 tablet (500 mg total) by mouth daily., Disp: 30 tablet, Rfl: 0   mirtazapine  (REMERON ) 15 MG tablet, Take 1 tablet (15 mg total)  by mouth at bedtime., Disp: 30 tablet, Rfl: 1   Multiple Vitamins-Calcium  (ONE-A-DAY WOMENS FORMULA PO), Take 1 tablet by mouth daily., Disp: , Rfl:    prazosin  (MINIPRESS ) 1 MG capsule, Take 1 capsule (1 mg total) by mouth at bedtime. To prevent nightmares, Disp: 30 capsule, Rfl: 1   QUEtiapine  (SEROQUEL ) 50 MG tablet, Take 1 tablet (50 mg total) by mouth at bedtime., Disp: 30 tablet, Rfl: 0   Semaglutide , 1 MG/DOSE, 4 MG/3ML SOPN, Inject 1 mg into the skin once a week., Disp: 3 mL, Rfl: 3  Vitals  There were no vitals filed for this visit.  There is no height or weight on file to calculate BMI.  Physical Exam   Physical Exam  HEENT:  [place]/AT Lungs: Respirations unlabored Extremities: Warm and well perfused.   Neurological Examination Mental Status: Awake and alert. Oriented x 5. Speech fluent with intact naming and comprehension. Dysarthria noted, which has some qualities of embellishment.  Cranial Nerves: II: Left homonymous hemianopsia. PERRL.    III,IV, VI: No ptosis. EOMI. No nystagmus.  V: Temp sensation decreased on the left.  VII: Quivers and tenses up lips when asked to smile. The facial contractions are asymmetric but there is no definite facial weakness seen. She does not comply when asked to puff out cheeks VIII: Hearing intact to conversation IX,X: No hoarseness or hypophonia XI: Head is midline XII: Tongue protrudes about 1/2 cm from right side of mouth when asked to stick it out Motor: RUE with rotator cuff pain limiting  assessment. 5/5 distally. No drift LUE with inconsistencies across separate trials. Can elevate with slow drift when coached. Can grip with 4/5 strength. Giveway weakness more proximally RLE 5/5 with no drift LLE with giveway weakness and increased tone (muscles are tensed up proximally and distally, with foot pointed downwards). Drift is noted.  Sensory: Decreased left sided sensation varies between trials. Insensate with stroke RN, but can feel with decreased sensation to LUE and LLE on attending exam Deep Tendon Reflexes: Normoactive RUE, LUE and RLE. LLE tensed up and cannot reliably obtain reflexes.  Cerebellar: No ataxia with FNF or H-S bilaterally Gait: Deferred   Labs/Imaging/Neurodiagnostic studies   CBC: No results for input(s): WBC, NEUTROABS, HGB, HCT, MCV, PLT in the last 168 hours. Basic Metabolic Panel:  Lab Results  Component Value Date   NA 138 10/13/2023   K 3.7 10/13/2023   CO2 23 10/13/2023   GLUCOSE 200 (H) 10/13/2023   BUN 7 10/13/2023   CREATININE 0.73 10/13/2023   CALCIUM  10.1 10/13/2023   GFRNONAA >60 10/13/2023   GFRAA >60 05/12/2020   Lipid Panel:  Lab Results  Component Value Date   LDLCALC 93 06/13/2023   HgbA1c:  Lab Results  Component Value Date   HGBA1C 10.1 (A) 11/17/2023   Urine Drug Screen:     Component Value Date/Time   LABOPIA NONE DETECTED 02/05/2020 0810   COCAINSCRNUR POSITIVE (A) 02/05/2020 0810   LABBENZ NONE DETECTED 02/05/2020 0810   AMPHETMU NONE DETECTED 02/05/2020 0810   THCU POSITIVE (A) 02/05/2020 0810   LABBARB NONE DETECTED 02/05/2020 0810    Alcohol  Level     Component Value Date/Time   ETH <10 09/02/2017 0743   INR  Lab Results  Component Value Date   INR 1.01 03/05/2013   APTT  Lab Results  Component Value Date   APTT 31 03/05/2013     ASSESSMENT  53 year old female with a PMHx of anemia, CAD s/p stenting,  anxiety, chronic pain (abdominal, back and headaches), depression, DM,  fibromyalgia, HLD, GERD, HTN, neuromuscular disorder, neuropathy, obesity, PTSD and substance abuse (history of cocaine and MJ use) who presents from home to the ED via EMS as a Code Stroke. LKN was 11:00 PM yesterday night. She then started having chest pain and did not feel normal, but is unable to further specify. She then went to bed at 2 AM. On awakening at 0700, she had slurred speech. She then syncopized at 0720. On coming to, she had left sided weakness, continued slurred speech and left facial droop. She is taking ASA regularly. Formerly was on Brilinta  for prior MI.  - Exam reveals multiple functional/inconsistent components suggestive of embellishment.  - CT head:  No evidence of an acute intracranial abnormality. ASPECTS is 10. Partially empty sella turcica. This finding can reflect incidental anatomic variation, or alternatively, it can be associated with chronic idiopathic intracranial hypertension (pseudotumor cerebri). - CTA neck: The common carotid, internal carotid and vertebral arteries are patent within the neck without hemodynamically significant stenosis. Mild atherosclerotic plaque within the proximal right ICA. - CTA head: No proximal intracranial large vessel occlusion identified. Atherosclerotic plaque within the cavernous right internal carotid artery with mild stenosis. - CT perfusion head: The perfusion software identifies no core infarct. The perfusion software identifies no critically hypoperfused parenchyma (utilizing the Tmax>6 seconds threshold). No mismatch volume reported. 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   RECOMMENDATIONS  - MRI brain (ordered) - Further recommendations following MRI - Outpatient Neurology follow  up ______________________________________________________________________    Bonney SHARK, Damarian Priola, MD [facility]

## 2023-11-27 ENCOUNTER — Encounter: Payer: MEDICAID | Admitting: Internal Medicine

## 2023-11-29 ENCOUNTER — Ambulatory Visit: Payer: MEDICAID

## 2023-11-29 DIAGNOSIS — M5416 Radiculopathy, lumbar region: Secondary | ICD-10-CM

## 2023-11-29 DIAGNOSIS — M6281 Muscle weakness (generalized): Secondary | ICD-10-CM

## 2023-11-29 DIAGNOSIS — G8929 Other chronic pain: Secondary | ICD-10-CM

## 2023-11-29 DIAGNOSIS — M25511 Pain in right shoulder: Secondary | ICD-10-CM | POA: Diagnosis not present

## 2023-11-29 DIAGNOSIS — M5459 Other low back pain: Secondary | ICD-10-CM
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
## 2023-12-02 ENCOUNTER — Ambulatory Visit: Payer: MEDICAID

## 2023-12-04 ENCOUNTER — Ambulatory Visit: Payer: MEDICAID | Admitting: Physical Therapy

## 2023-12-08 NOTE — H&P (Signed)
  Patient: Salli Bodin  PID: [id number]  DOB: Jan 25, 1971  SEX: Female   Patient referred by DDS for extraction all upper teeth  CC: Bad teeth. Pain lower left.  Past Medical History:  High Blood Pressure, Chest Pain or Angina, Heart Attack, Heart Stent, Irregular Heart Beat, Hay Fever or Sinus Problems, Snoring, Alcoholism, in remission, marijuana use, crack abuse, Anemia, gallbladder removed, Diabetes type 2, High Cholesterol, Arthritis, Acid Reflux, PTSD, Borderline Personality disorder, Bipolar 2, suicide attempt, OCD, Hypertension, Hyperlipidemia, CAD, NSTEMI, Drug Abuse/Alcohol Abuse, Shingles, cholelithiasis, Peripheral Neuropathy    Medications: Aspirin, Ezetimibe, Prozac, Neurontin, Atarax, Irbesartan, Metformin, Mirtazapine, Minipress, Seroquel, Semaglutide    Allergies:     Epinephrine, Ambien    Surgeries:   Stent placed, Hysterectomy, carpal tunnel, tendonitis     Social History       Smoking:            Alcohol: Drug use:                             Exam: BMI 30. Residual roots # 2, 11, 13, erupted 6, 7, 8, 9, 14. Large decay # 19.  No purulence, edema, fluctuance, trismus. Oral cancer screening negative. Pharynx clear. No lymphadenopathy.  Panorex:Residual roots # 2, 11, 13, erupted 6, 7, 8, 9, 14. Large decay # 19.   Assessment: ASA 3. Non-restorable 2, 6, 7, 8, 9, 11, 13, 15, 19              Plan: 1. MD Clearance 2. Extraction Teeth #   2, 6, 7, 8, 9, 11, 13, 15, 19, Alveoloplasty.      Hospital Day surgery.                 Rx: n               Risks and complications explained. Questions answered.   Georgia Lopes, DMD

## 2023-12-09 ENCOUNTER — Encounter (HOSPITAL_COMMUNITY): Payer: Self-pay | Admitting: Oral Surgery

## 2023-12-09 ENCOUNTER — Other Ambulatory Visit: Payer: Self-pay

## 2023-12-09 NOTE — Progress Notes (Signed)
SDW CALL  Patient was given pre-op instructions over the phone. The opportunity was given for the patient to ask questions. No further questions asked. Patient verbalized understanding of instructions given.   PCP - Dr. Jason Fila Cardiologist - Dr. Rachelle Hora Croitoru  PPM/ICD - denies Device Orders - n/a Rep Notified - n/a  Chest x-ray - 10/13/23 EKG - 11/26/23 Stress Test - 03/16/2011 ECHO - 02/06/20 Cardiac Cath - 02/07/20  Sleep Study - denies CPAP - n/a  Patient does not check blood sugars at home  Last dose of GLP1 agonist-  Saturday, 2/15 GLP1 instructions: patient aware not to take Ozempic and Jardiance prior to procedure on 2/19 or 2/20  Blood Thinner Instructions: n/a Aspirin Instructions: patient states that she has not taken Aspirin in months because she has been out  ERAS Protcol - NPO PRE-SURGERY Ensure or G2-  n/a  COVID TEST- no   Anesthesia review: yes - cardiac history, GLP1  Patient denies shortness of breath, fever, cough and chest pain over the phone call   All instructions explained to the patient, with a verbal understanding of the material. Patient agrees to go over the instructions while at home for a better understanding.

## 2023-12-10 NOTE — Progress Notes (Signed)
Anesthesia Chart Review: SAME DAY WORK-UP  Case: [id number] Date/Time: 12/11/23 1100   Procedure: DENTAL RESTORATION/EXTRACTIONS   Anesthesia type: General   Pre-op diagnosis: NON RESTORABLE   [facility]: [facility] ROOM 02 / [facility]   Surgeons: Ocie Doyne, DMD       DISCUSSION: Patient is a 53 year old female scheduled for the above procedure. Per H&P: Extraction Teeth # 2, 6, 7, 8, 9, 11, 13, 15, 19, Alveoloplasty.   History includes former smoker (quit 01/20/20), post-operative N/V, CAD (NSTEMI, 1V CAD s/p DES mLCX 02/07/20, + UDS cocaine), DM2, HTN, HLD, substance abuse ("crack" cocaine, marijuana), PTSD, fibromyalgia,   Last visit with cardiologist Dr. Royann Shivers was on 06/04/23 (seen after a nearly 3 year lapse). She was asymptomatic at that time. On ASA, atorvastatin. She was not on b-blocker due to previous cocaine use. Irbesartan started for better HTN control. Glycemic control had been improving but still suboptimal and thought she would benefit from dietician counseling. One year cardiology follow-up planned.   She had primary care visit with Faith Rogue, DO at the [facility] on 11/17/23 for follow-up of chronic medical conditions and ED follow-up from 10/13/23 visit for chest pain, headaches, and elevated BP for several days. Troponins negative x2. Hydralazine IV given for BP 191/99.  Amdolipine added to irbesartan regimen at 11/17/23 visit. A1c 10.1 (up from 8.0% on 08/15/23) . She had had a recent steroid injection to her shoulder and also had been treated for gingivitis and oral abscesses recently by oral surgery. Jardiance added to metformin and Ozempid regimen.   More recently he had ED visit on 11/26/23. Per notes, her chest hurt before bed and work up with slurred speech. She felt her left side was weak with facial droop and thought she passed out. A CODE Stroke was called but head CT without evidence of acute intracranial abnormality, ASPECTS was 10, partially empty sella turcica which can  be associated with could reflect anatomic variation or chronic idiopathic intracranial hypertension. CTA of the head and neck showed mild right ICA stenosis, no proximal intracranial large vessel occlusion, no critically hypoperfused parenchyma. By neurologist exam she had "multiple functional/inconsistent components suggestive of embellishment." MRI considered but patient left AMA. Ethanol < 10. HS Troponin x1 was normal at 3. CBC normal. Glucose 191. Creatinine 0.81.   Last Ozempic 12/06/23. Last Jardiance 12/09/23.    Reviewed with anesthesiologist Anice Paganini, DO. Recent CT imaging did not show evidence of infarct. Neurology felt exam findings inconsistent and suggestive of embellishment. She did not have a brain MRI. Definitive anesthesia plan following anesthesia team evaluation on the day of surgery.     VS: LMP 10/07/2011  Wt Readings from Last 3 Encounters:  11/26/23 81.6 kg  11/17/23 83.2 kg  10/13/23 83.5 kg   Temp Readings from Last 3 Encounters:  11/26/23 37.8 C (Oral)  11/17/23 36.6 C (Oral)  10/13/23 36.9 C   BP Readings from Last 3 Encounters:  11/26/23 (!) 143/92  11/17/23 (!) 148/80  10/13/23 (!) 179/98   Pulse Readings from Last 3 Encounters:  11/26/23 (!) 107  11/17/23 98  10/13/23 70     PROVIDERS: Olegario Messier, MD is listed as PCP    LABS: Most recent labs in [facility] include: Lab Results  Component Value Date   WBC 7.6 11/26/2023   HGB 11.9 (L) 11/26/2023   HCT 36.4 11/26/2023   PLT 276 11/26/2023   GLUCOSE 191 (H) 11/26/2023   CHOL 164 06/13/2023  TRIG 114 06/13/2023   HDL 51 06/13/2023   LDLCALC 93 06/13/2023   ALT 21 11/26/2023   AST 21 11/26/2023   NA 138 11/26/2023   K 3.7 11/26/2023   CL 104 11/26/2023   CREATININE 0.81 11/26/2023   BUN 5 (L) 11/26/2023   CO2 21 (L) 11/26/2023   TSH 0.556 02/05/2020   INR 1.0 11/26/2023   HGBA1C 10.1 (A) 11/17/2023   MICROALBUR 0.2 05/30/2015     IMAGES: 1V PCXR 11/26/23: FINDINGS: The heart  size and mediastinal contours are within normal limits. Both lungs are clear. The visualized skeletal structures are unremarkable. IMPRESSION: No active disease.   CT Head Perfusion/CTA Head/Neck 11/26/23: IMPRESSION: CTA neck: The common carotid, internal carotid and vertebral are patent within the neck without hemodynamically significant stenosis. Mild atherosclerotic plaque within the proximal right ICA.   CTA head: 1. No proximal intracranial large vessel occlusion identified. 2. Atherosclerotic plaque within the cavernous right internal carotid artery with mild stenosis.   CT perfusion head: The perfusion software identifies no core infarct. The perfusion software identifies no critically hypoperfused parenchyma (utilizing the Tmax>6 seconds threshold). No mismatch volume reported.   CT Head (Code Stroke) 11/26/23: IMPRESSION: 1.  No evidence of an acute intracranial abnormality. ASPECTS is 10. 2. Partially empty sella turcica. This finding can reflect incidental anatomic variation, or alternatively, it can be associated with chronic idiopathic intracranial hypertension (pseudotumor cerebri).    EKG: EKG 11/26/23:  Sinus tachycardia at 108 bpm Borderline repolarization abnormality Borderline ST elevation, lateral leads Artifact Otherwise no significant change Confirmed by Elayne Snare ([id number]) on 11/26/2023 9:55:01 AM  CV: Cardiac cath 02/07/20: SUMMARY Severe Single Vessel CAD - CULPRIT LESION mLCx 99% subtotalled / thrombotic stenosis Successful DES PCI of mCx - Resolute Onyx DES 2.5 mm x 12 mm (2.7 mm) Moderate m-distal LAD focal 60% in bend. Otherwise minmal CAD elsewhere. Normal LVEDP   Echo 02/06/20: IMPRESSIONS   1. Left ventricular ejection fraction, by estimation, is 55 to 60%. The  left ventricle has normal function. There is mild left ventricular  hypertrophy. Left ventricular diastolic parameters are indeterminate.   2. Right ventricular systolic  function is normal. The right ventricular  size is normal. Tricuspid regurgitation signal is inadequate for assessing  PA pressure.   3. Left atrial size was mildly dilated.   4. The mitral valve is normal in structure. No evidence of mitral valve  regurgitation.   5. The aortic valve is tricuspid. Aortic valve regurgitation is not  visualized. Mild to moderate aortic valve sclerosis/calcification is  present, without any evidence of aortic stenosis.   6. The inferior vena cava is normal in size with greater than 50%  respiratory variability, suggesting right atrial pressure of 3 mmHg.    Past Medical History:  Diagnosis Date   Anemia    Anxiety    Arthritis    Cholelithiasis with acute cholecystitis 06/28/2013   Chronic abdominal pain    Chronic back pain    Chronic headache    Chronic neck pain    Coronary artery disease    Depression    Diabetes mellitus    adult onset dm-maintained on glipizide and metformin, pt states fasting glucose runs 110   Fibromyalgia    GERD (gastroesophageal reflux disease)    HLD (hyperlipidemia) 05/30/2015   Hyperlipidemia    Hypertension    maintained on lisinopril hct, metoprolol-134/86 at pat visit   Neuromuscular disorder [facility])    Neuropathy    NSTEMI (non-ST  elevated myocardial infarction) (HCC) 02/05/2020   Obesity    PONV (postoperative nausea and vomiting)    PTSD (post-traumatic stress disorder)    Right arm weakness 03/05/2013   Substance abuse (HCC)     Past Surgical History:  Procedure Laterality Date   ABDOMINAL HYSTERECTOMY  10/30/2011   Procedure: HYSTERECTOMY ABDOMINAL;  Surgeon: Bing Plume, MD;  Location: [facility] ORS;  Service: Gynecology;  Laterality: N/A;   CESAREAN SECTION  x3   CHOLECYSTECTOMY N/A 06/28/2013   Procedure: LAPAROSCOPIC CHOLECYSTECTOMY;  Surgeon: Shelly Rubenstein, MD;  Location: [facility] OR;  Service: General;  Laterality: N/A;   CORONARY STENT INTERVENTION N/A 02/07/2020   Procedure: CORONARY STENT  INTERVENTION;  Surgeon: Marykay Lex, MD;  Location: [facility] INVASIVE CV LAB;  Service: Cardiovascular;  Laterality: N/A;   LEFT HEART CATH AND CORONARY ANGIOGRAPHY N/A 02/07/2020   Procedure: LEFT HEART CATH AND CORONARY ANGIOGRAPHY;  Surgeon: Marykay Lex, MD;  Location: [facility] INVASIVE CV LAB;  Service: Cardiovascular;  Laterality: N/A;   SALPINGOOPHORECTOMY  10/30/2011   Procedure: SALPINGO OOPHERECTOMY;  Surgeon: Bing Plume, MD;  Location: [facility] ORS;  Service: Gynecology;  Laterality: Right;   WRIST SURGERY Left    carpel tunnel and tendonitis    MEDICATIONS: No current facility-administered medications for this encounter.    amLODipine (NORVASC) 5 MG tablet   aspirin EC 81 MG EC tablet   empagliflozin (JARDIANCE) 10 MG TABS tablet   ezetimibe (ZETIA) 10 MG tablet   FLUoxetine (PROZAC) 20 MG capsule   fluticasone (FLOVENT HFA) 44 MCG/ACT inhaler   gabapentin (NEURONTIN) 300 MG capsule   hydrOXYzine (ATARAX) 25 MG tablet   ibuprofen (ADVIL) 200 MG tablet   irbesartan (AVAPRO) 300 MG tablet   metFORMIN (GLUCOPHAGE) 1000 MG tablet   mirtazapine (REMERON) 15 MG tablet   Multiple Vitamins-Calcium (ONE-A-DAY WOMENS FORMULA PO)   naltrexone (DEPADE) 50 MG tablet   prazosin (MINIPRESS) 1 MG capsule   QUEtiapine (SEROQUEL) 50 MG tablet   Semaglutide, 1 MG/DOSE, 4 MG/3ML SOPN   traZODone (DESYREL) 50 MG tablet   Blood Pressure Monitoring (BLOOD PRESSURE CUFF) MISC    Shonna Chock, PA-C Surgical Short Stay/Anesthesiology [facility] Phone [phone] [facility] Phone [phone] 12/10/2023 3:38 PM

## 2023-12-10 NOTE — Anesthesia Preprocedure Evaluation (Signed)
Anesthesia Evaluation  Patient identified by MRN, date of birth, ID band Patient awake    Reviewed: Allergy & Precautions, NPO status , Patient's Chart, lab work & pertinent test results  History of Anesthesia Complications (+) PONV and history of anesthetic complications  Airway Mallampati: III  TM Distance: >3 FB Neck ROM: Full    Dental  (+) Dental Advisory Given, Poor Dentition, Chipped   Pulmonary former smoker   Pulmonary exam normal breath sounds clear to auscultation       Cardiovascular hypertension, Pt. on medications + CAD, + Past MI and + Cardiac Stents  (-) DOE  Rhythm:Regular Rate:Tachycardia  LHC 01/2020  Severe Single Vessel CAD - CULPRIT LESION mLCx 99% subtotalled / thrombotic stenosis  Successful DES PCI of mCx - Resolute Onyx DES 2.5 mm x 12 mm (2.7 mm)  Moderate m-distal LAD focal 60% in bend.  Otherwise minmal CAD elsewhere.  Normal LVEDP   Echo 01/2020  1. Left ventricular ejection fraction, by estimation, is 55 to 60%. The left ventricle has normal function. There is mild left ventricular hypertrophy. Left ventricular diastolic parameters are indeterminate.   2. Right ventricular systolic function is normal. The right ventricular size is normal. Tricuspid regurgitation signal is inadequate for assessing PA pressure.   3. Left atrial size was mildly dilated.   4. The mitral valve is normal in structure. No evidence of mitral valve regurgitation.   5. The aortic valve is tricuspid. Aortic valve regurgitation is not visualized. Mild to moderate aortic valve sclerosis/calcification is present, without any evidence of aortic stenosis.   6. The inferior vena cava is normal in size with greater than 50% respiratory variability, suggesting right atrial pressure of 3 mmHg.      Neuro/Psych  Headaches PSYCHIATRIC DISORDERS Anxiety Depression Bipolar Disorder    Neuromuscular disease    GI/Hepatic Neg liver  ROS,GERD  ,,  Endo/Other  diabetes    Renal/GU negative Renal ROS     Musculoskeletal  (+) Arthritis ,  Fibromyalgia -  Abdominal  (+) + obese  Peds  Hematology  (+) Blood dyscrasia, anemia   Anesthesia Other Findings   Reproductive/Obstetrics                              Anesthesia Physical Anesthesia Plan  ASA: 4  Anesthesia Plan: General   Post-op Pain Management: Tylenol PO (pre-op)*   Induction: Intravenous  PONV Risk Score and Plan: 4 or greater and Ondansetron, Treatment may vary due to age or medical condition, Dexamethasone and Midazolam  Airway Management Planned: Nasal ETT and Video Laryngoscope Planned  Additional Equipment:   Intra-op Plan:   Post-operative Plan: Extubation in OR  Informed Consent: I have reviewed the patients History and Physical, chart, labs and discussed the procedure including the risks, benefits and alternatives for the proposed anesthesia with the patient or authorized representative who has indicated his/her understanding and acceptance.     Dental advisory given  Plan Discussed with: CRNA  Anesthesia Plan Comments:          Anesthesia Quick Evaluation

## 2023-12-11 ENCOUNTER — Other Ambulatory Visit: Payer: Self-pay

## 2023-12-11 ENCOUNTER — Ambulatory Visit (HOSPITAL_COMMUNITY): Payer: MEDICAID | Admitting: Vascular Surgery

## 2023-12-11 ENCOUNTER — Observation Stay (HOSPITAL_COMMUNITY)
Admission: RE | Admit: 2023-12-11 | Discharge: 2023-12-12 | Disposition: A | Discharge status: Home / Self Care | Payer: MEDICAID | Attending: Oral Surgery | Admitting: Oral Surgery

## 2023-12-11 ENCOUNTER — Encounter (HOSPITAL_COMMUNITY)
Admission: RE | Disposition: A | Discharge status: Home / Self Care | Payer: Self-pay | Source: Home / Self Care | Attending: Oral Surgery

## 2023-12-11 ENCOUNTER — Ambulatory Visit (HOSPITAL_BASED_OUTPATIENT_CLINIC_OR_DEPARTMENT_OTHER): Payer: MEDICAID | Admitting: Vascular Surgery

## 2023-12-11 ENCOUNTER — Encounter (HOSPITAL_COMMUNITY): Payer: Self-pay | Admitting: Oral Surgery

## 2023-12-11 DIAGNOSIS — Z7984 Long term (current) use of oral hypoglycemic drugs: Secondary | ICD-10-CM | POA: Insufficient documentation

## 2023-12-11 DIAGNOSIS — K0859 Other unsatisfactory restoration of tooth: Secondary | ICD-10-CM

## 2023-12-11 DIAGNOSIS — I2510 Atherosclerotic heart disease of native coronary artery without angina pectoris: Secondary | ICD-10-CM | POA: Diagnosis not present

## 2023-12-11 DIAGNOSIS — I10 Essential (primary) hypertension: Secondary | ICD-10-CM | POA: Diagnosis not present

## 2023-12-11 DIAGNOSIS — F418 Other specified anxiety disorders: Secondary | ICD-10-CM

## 2023-12-11 DIAGNOSIS — K0889 Other specified disorders of teeth and supporting structures: Secondary | ICD-10-CM | POA: Diagnosis present

## 2023-12-11 DIAGNOSIS — E119 Type 2 diabetes mellitus without complications: Secondary | ICD-10-CM | POA: Diagnosis not present

## 2023-12-11 DIAGNOSIS — Z7982 Long term (current) use of aspirin: Secondary | ICD-10-CM | POA: Diagnosis not present

## 2023-12-11 DIAGNOSIS — Z79899 Other long term (current) drug therapy: Secondary | ICD-10-CM | POA: Insufficient documentation

## 2023-12-11 DIAGNOSIS — Z9889 Other specified postprocedural states: Principal | ICD-10-CM

## 2023-12-11 HISTORY — PX: TOOTH EXTRACTION: SHX859

## 2023-12-11 HISTORY — DX: Other specified postprocedural states: Z98.890

## 2023-12-11 HISTORY — DX: Other specified postprocedural states: R11.2

## 2023-12-11 LAB — HIV ANTIBODY (ROUTINE TESTING W REFLEX): HIV Screen 4th Generation wRfx: NONREACTIVE

## 2023-12-11 LAB — GLUCOSE, CAPILLARY
Glucose-Capillary: 175 mg/dL — ABNORMAL HIGH (ref 70–99)
Glucose-Capillary: 169 mg/dL — ABNORMAL HIGH (ref 70–99)
Glucose-Capillary: 211 mg/dL — ABNORMAL HIGH (ref 70–99)

## 2023-12-11 SURGERY — DENTAL RESTORATION/EXTRACTIONS
Anesthesia: General | Site: Mouth

## 2023-12-11 MED ORDER — PHENYLEPHRINE 80 MCG/ML (10ML) SYRINGE FOR IV PUSH (FOR BLOOD PRESSURE SUPPORT)
PREFILLED_SYRINGE | INTRAVENOUS | Status: DC | PRN
  Administered 2023-12-11 (×2): 80 ug via INTRAVENOUS
  Administered 2023-12-11: 160 ug via INTRAVENOUS

## 2023-12-11 MED ORDER — HYDROMORPHONE HCL 1 MG/ML IJ SOLN
0.5000 mg | INTRAMUSCULAR | Status: DC | PRN
  Administered 2023-12-11: 0.5 mg via INTRAVENOUS
  Filled 2023-12-11: qty 0.5

## 2023-12-11 MED ORDER — MEPERIDINE HCL 25 MG/ML IJ SOLN: 6.2500 mg | INTRAMUSCULAR | Status: DC | PRN

## 2023-12-11 MED ORDER — ONDANSETRON 4 MG PO TBDP: 4.0000 mg | ORAL_TABLET | Freq: Four times a day (QID) | ORAL | Status: DC | PRN

## 2023-12-11 MED ORDER — HYDROCODONE-ACETAMINOPHEN 5-325 MG PO TABS
1.0000 | ORAL_TABLET | ORAL | Status: DC | PRN
  Administered 2023-12-11: 2 via ORAL
  Filled 2023-12-11: qty 2

## 2023-12-11 MED ORDER — DROPERIDOL 2.5 MG/ML IJ SOLN: 0.6250 mg | Freq: Once | INTRAMUSCULAR | Status: DC | PRN

## 2023-12-11 MED ORDER — INSULIN ASPART 100 UNIT/ML IJ SOLN
0.0000 [IU] | INTRAMUSCULAR | Status: DC | PRN
  Administered 2023-12-11: 2 [IU] via SUBCUTANEOUS

## 2023-12-11 MED ORDER — FLUOXETINE HCL 20 MG PO CAPS
60.0000 mg | ORAL_CAPSULE | Freq: Every day | ORAL | Status: DC
Start: 2023-12-12 — End: 2023-12-12
  Administered 2023-12-12: 60 mg via ORAL
  Filled 2023-12-11: qty 3

## 2023-12-11 MED ORDER — LIDOCAINE 2% (20 MG/ML) 5 ML SYRINGE
INTRAMUSCULAR | Status: AC
  Filled 2023-12-11: qty 5

## 2023-12-11 MED ORDER — SODIUM CHLORIDE 0.9% FLUSH: 3.0000 mL | INTRAVENOUS | Status: DC | PRN

## 2023-12-11 MED ORDER — CEFAZOLIN SODIUM-DEXTROSE 2-4 GM/100ML-% IV SOLN
2.0000 g | INTRAVENOUS | Status: AC
  Administered 2023-12-11: 2 g via INTRAVENOUS
  Filled 2023-12-11: qty 100

## 2023-12-11 MED ORDER — OXYMETAZOLINE HCL 0.05 % NA SOLN
NASAL | Status: DC | PRN
  Administered 2023-12-11: 2 via NASAL

## 2023-12-11 MED ORDER — DIPHENHYDRAMINE HCL 25 MG PO CAPS: 25.0000 mg | ORAL_CAPSULE | Freq: Four times a day (QID) | ORAL | Status: DC | PRN

## 2023-12-11 MED ORDER — ONDANSETRON HCL 4 MG/2ML IJ SOLN
INTRAMUSCULAR | Status: DC | PRN
  Administered 2023-12-11: 4 mg via INTRAVENOUS

## 2023-12-11 MED ORDER — DEXAMETHASONE SODIUM PHOSPHATE 10 MG/ML IJ SOLN
INTRAMUSCULAR | Status: AC
  Filled 2023-12-11: qty 1

## 2023-12-11 MED ORDER — GABAPENTIN 300 MG PO CAPS
600.0000 mg | ORAL_CAPSULE | Freq: Three times a day (TID) | ORAL | Status: DC
Start: 2023-12-11 — End: 2023-12-12
  Administered 2023-12-11 – 2023-12-12 (×3): 600 mg via ORAL
  Filled 2023-12-11 (×3): qty 2

## 2023-12-11 MED ORDER — INSULIN ASPART 100 UNIT/ML IJ SOLN
INTRAMUSCULAR | Status: AC
  Filled 2023-12-11: qty 1

## 2023-12-11 MED ORDER — LIDOCAINE-EPINEPHRINE 2 %-1:100000 IJ SOLN
INTRAMUSCULAR | Status: AC
  Filled 2023-12-11: qty 1

## 2023-12-11 MED ORDER — FENTANYL CITRATE (PF) 100 MCG/2ML IJ SOLN
INTRAMUSCULAR | Status: AC
  Filled 2023-12-11: qty 2

## 2023-12-11 MED ORDER — ONDANSETRON HCL 4 MG/2ML IJ SOLN
INTRAMUSCULAR | Status: AC
  Filled 2023-12-11: qty 2

## 2023-12-11 MED ORDER — ONDANSETRON HCL 4 MG/2ML IJ SOLN: 4.0000 mg | Freq: Four times a day (QID) | INTRAMUSCULAR | Status: DC | PRN

## 2023-12-11 MED ORDER — DEXMEDETOMIDINE HCL IN NACL 80 MCG/20ML IV SOLN
INTRAVENOUS | Status: AC
  Filled 2023-12-11: qty 20

## 2023-12-11 MED ORDER — METOPROLOL TARTRATE 5 MG/5ML IV SOLN: 5.0000 mg | Freq: Four times a day (QID) | INTRAVENOUS | Status: DC | PRN

## 2023-12-11 MED ORDER — ROCURONIUM BROMIDE 10 MG/ML (PF) SYRINGE
PREFILLED_SYRINGE | INTRAVENOUS | Status: DC | PRN
  Administered 2023-12-11: 50 mg via INTRAVENOUS

## 2023-12-11 MED ORDER — ORAL CARE MOUTH RINSE: 15.0000 mL | Freq: Once | OROMUCOSAL | Status: AC

## 2023-12-11 MED ORDER — ESMOLOL HCL 100 MG/10ML IV SOLN
INTRAVENOUS | Status: DC | PRN
  Administered 2023-12-11 (×2): 30 mg via INTRAVENOUS
  Administered 2023-12-11: 20 mg via INTRAVENOUS

## 2023-12-11 MED ORDER — LACTATED RINGERS IV SOLN: INTRAVENOUS | Status: DC

## 2023-12-11 MED ORDER — PROPOFOL 10 MG/ML IV BOLUS
INTRAVENOUS | Status: AC
  Filled 2023-12-11: qty 20

## 2023-12-11 MED ORDER — ROCURONIUM BROMIDE 10 MG/ML (PF) SYRINGE
PREFILLED_SYRINGE | INTRAVENOUS | Status: AC
  Filled 2023-12-11: qty 10

## 2023-12-11 MED ORDER — AMLODIPINE BESYLATE 5 MG PO TABS
5.0000 mg | ORAL_TABLET | Freq: Every day | ORAL | Status: DC
  Administered 2023-12-12: 5 mg via ORAL
  Filled 2023-12-11: qty 1

## 2023-12-11 MED ORDER — LIDOCAINE 2% (20 MG/ML) 5 ML SYRINGE
INTRAMUSCULAR | Status: DC | PRN
  Administered 2023-12-11: 100 mg via INTRAVENOUS

## 2023-12-11 MED ORDER — MIDAZOLAM HCL 2 MG/2ML IJ SOLN
INTRAMUSCULAR | Status: DC | PRN
  Administered 2023-12-11: 2 mg via INTRAVENOUS

## 2023-12-11 MED ORDER — CHLORHEXIDINE GLUCONATE 0.12 % MT SOLN
15.0000 mL | Freq: Once | OROMUCOSAL | Status: AC
  Administered 2023-12-11: 15 mL via OROMUCOSAL

## 2023-12-11 MED ORDER — PROPOFOL 10 MG/ML IV BOLUS
INTRAVENOUS | Status: DC | PRN
  Administered 2023-12-11: 50 mg via INTRAVENOUS
  Administered 2023-12-11: 150 mg via INTRAVENOUS

## 2023-12-11 MED ORDER — METFORMIN HCL 500 MG PO TABS
1000.0000 mg | ORAL_TABLET | Freq: Two times a day (BID) | ORAL | Status: DC
  Administered 2023-12-11 – 2023-12-12 (×2): 1000 mg via ORAL
  Filled 2023-12-11 (×2): qty 2

## 2023-12-11 MED ORDER — ASPIRIN 81 MG PO TBEC
81.0000 mg | DELAYED_RELEASE_TABLET | Freq: Every day | ORAL | Status: DC
  Administered 2023-12-11 – 2023-12-12 (×2): 81 mg via ORAL
  Filled 2023-12-11 (×2): qty 1

## 2023-12-11 MED ORDER — HYDROXYZINE HCL 25 MG PO TABS: 25.0000 mg | ORAL_TABLET | Freq: Every day | ORAL | Status: DC | PRN

## 2023-12-11 MED ORDER — EZETIMIBE 10 MG PO TABS
10.0000 mg | ORAL_TABLET | Freq: Every day | ORAL | Status: DC
  Administered 2023-12-12: 10 mg via ORAL
  Filled 2023-12-11: qty 1

## 2023-12-11 MED ORDER — TRAZODONE HCL 50 MG PO TABS: 50.0000 mg | ORAL_TABLET | Freq: Every evening | ORAL | Status: DC | PRN

## 2023-12-11 MED ORDER — EMPAGLIFLOZIN 10 MG PO TABS
10.0000 mg | ORAL_TABLET | Freq: Every day | ORAL | Status: DC
Start: 2023-12-12 — End: 2023-12-12
  Administered 2023-12-12: 10 mg via ORAL
  Filled 2023-12-11: qty 1

## 2023-12-11 MED ORDER — SUGAMMADEX SODIUM 200 MG/2ML IV SOLN
INTRAVENOUS | Status: DC | PRN
  Administered 2023-12-11: 200 mg via INTRAVENOUS

## 2023-12-11 MED ORDER — PRAZOSIN HCL 1 MG PO CAPS
1.0000 mg | ORAL_CAPSULE | Freq: Every day | ORAL | Status: DC
  Administered 2023-12-11: 1 mg via ORAL
  Filled 2023-12-11 (×2): qty 1

## 2023-12-11 MED ORDER — IRBESARTAN 300 MG PO TABS
300.0000 mg | ORAL_TABLET | Freq: Every day | ORAL | Status: DC
  Administered 2023-12-12: 300 mg via ORAL
  Filled 2023-12-11: qty 1

## 2023-12-11 MED ORDER — FENTANYL CITRATE (PF) 100 MCG/2ML IJ SOLN
25.0000 ug | INTRAMUSCULAR | Status: DC | PRN
Start: 2023-12-11 — End: 2023-12-11
  Administered 2023-12-11 (×3): 50 ug via INTRAVENOUS

## 2023-12-11 MED ORDER — 0.9 % SODIUM CHLORIDE (POUR BTL) OPTIME
TOPICAL | Status: DC | PRN
  Administered 2023-12-11: 1000 mL

## 2023-12-11 MED ORDER — MIRTAZAPINE 15 MG PO TABS
15.0000 mg | ORAL_TABLET | Freq: Every day | ORAL | Status: DC
Start: 2023-12-11 — End: 2023-12-12
  Administered 2023-12-11: 15 mg via ORAL
  Filled 2023-12-11: qty 1

## 2023-12-11 MED ORDER — BUDESONIDE 0.25 MG/2ML IN SUSP
0.2500 mg | Freq: Two times a day (BID) | RESPIRATORY_TRACT | Status: DC
  Administered 2023-12-11 – 2023-12-12 (×2): 0.25 mg via RESPIRATORY_TRACT
  Filled 2023-12-11 (×2): qty 2

## 2023-12-11 MED ORDER — IBUPROFEN 800 MG PO TABS
800.0000 mg | ORAL_TABLET | Freq: Four times a day (QID) | ORAL | Status: DC | PRN
  Administered 2023-12-12: 800 mg via ORAL
  Filled 2023-12-11: qty 1

## 2023-12-11 MED ORDER — DIPHENHYDRAMINE HCL 50 MG/ML IJ SOLN: 25.0000 mg | Freq: Four times a day (QID) | INTRAMUSCULAR | Status: DC | PRN

## 2023-12-11 MED ORDER — FENTANYL CITRATE (PF) 250 MCG/5ML IJ SOLN
INTRAMUSCULAR | Status: DC | PRN
  Administered 2023-12-11: 100 ug via INTRAVENOUS
  Administered 2023-12-11: 50 ug via INTRAVENOUS

## 2023-12-11 MED ORDER — SODIUM CHLORIDE 0.9 % IR SOLN
Status: DC | PRN
  Administered 2023-12-11: 1

## 2023-12-11 MED ORDER — DEXAMETHASONE SODIUM PHOSPHATE 10 MG/ML IJ SOLN
INTRAMUSCULAR | Status: DC | PRN
  Administered 2023-12-11: 10 mg via INTRAVENOUS

## 2023-12-11 MED ORDER — OXYMETAZOLINE HCL 0.05 % NA SOLN
NASAL | Status: AC
  Filled 2023-12-11: qty 30

## 2023-12-11 MED ORDER — QUETIAPINE FUMARATE 50 MG PO TABS
50.0000 mg | ORAL_TABLET | Freq: Every day | ORAL | Status: DC
  Administered 2023-12-11: 50 mg via ORAL
  Filled 2023-12-11: qty 1

## 2023-12-11 MED ORDER — NALTREXONE HCL 50 MG PO TABS: 50.0000 mg | ORAL_TABLET | Freq: Every day | ORAL | Status: DC | PRN

## 2023-12-11 MED ORDER — FENTANYL CITRATE (PF) 250 MCG/5ML IJ SOLN
INTRAMUSCULAR | Status: AC
Start: 2023-12-11 — End: ?
  Filled 2023-12-11: qty 5

## 2023-12-11 MED ORDER — ACETAMINOPHEN 500 MG PO TABS
1000.0000 mg | ORAL_TABLET | Freq: Once | ORAL | Status: AC
  Administered 2023-12-11: 1000 mg via ORAL
  Filled 2023-12-11: qty 2

## 2023-12-11 MED ORDER — LIDOCAINE-EPINEPHRINE 2 %-1:100000 IJ SOLN
INTRAMUSCULAR | Status: DC | PRN
  Administered 2023-12-11: 15 mL

## 2023-12-11 MED ORDER — MIDAZOLAM HCL 2 MG/2ML IJ SOLN
INTRAMUSCULAR | Status: AC
  Filled 2023-12-11: qty 2

## 2023-12-11 MED ORDER — SODIUM CHLORIDE 0.9% FLUSH
3.0000 mL | Freq: Two times a day (BID) | INTRAVENOUS | Status: DC
  Administered 2023-12-11 – 2023-12-12 (×3): 10 mL via INTRAVENOUS

## 2023-12-11 SURGICAL SUPPLY — 24 items
BLADE SURG 15 STRL LF DISP TIS (BLADE) ×2 IMPLANT
BUR CROSS CUT FISSURE 1.6 (BURR) ×2 IMPLANT
BUR EGG ELITE 4.0 (BURR) ×2 IMPLANT
CANISTER SUCT 3000ML PPV (MISCELLANEOUS) ×2 IMPLANT
COVER SURGICAL LIGHT HANDLE (MISCELLANEOUS) ×2 IMPLANT
GAUZE PACKING FOLDED 2 STR (GAUZE/BANDAGES/DRESSINGS) ×2 IMPLANT
GLOVE BIO SURGEON STRL SZ8 (GLOVE) ×2 IMPLANT
GOWN STRL REUS W/ TWL LRG LVL3 (GOWN DISPOSABLE) ×2 IMPLANT
GOWN STRL REUS W/ TWL XL LVL3 (GOWN DISPOSABLE) ×2 IMPLANT
IV NS 1000ML BAXH (IV SOLUTION) ×2 IMPLANT
KIT BASIN OR (CUSTOM PROCEDURE TRAY) ×2 IMPLANT
KIT TURNOVER KIT B (KITS) ×2 IMPLANT
NDL HYPO 25GX1X1/2 BEV (NEEDLE) ×4 IMPLANT
NEEDLE HYPO 25GX1X1/2 BEV (NEEDLE) ×2 IMPLANT
NS IRRIG 1000ML POUR BTL (IV SOLUTION) ×2 IMPLANT
PAD ARMBOARD 7.5X6 YLW CONV (MISCELLANEOUS) ×2 IMPLANT
SLEEVE IRRIGATION ELITE 7 (MISCELLANEOUS) ×2 IMPLANT
SUT MNCRL 4-0 27 PS-2 XMFL (SUTURE) ×2
SUTURE MNCRL 4-0 27XMF (SUTURE) IMPLANT
SYR BULB IRRIG 60ML STRL (SYRINGE) ×2 IMPLANT
SYR CONTROL 10ML LL (SYRINGE) ×2 IMPLANT
TRAY ENT MC OR (CUSTOM PROCEDURE TRAY) ×2 IMPLANT
TUBING IRRIGATION (MISCELLANEOUS) ×2 IMPLANT
YANKAUER SUCT BULB TIP NO VENT (SUCTIONS) ×2 IMPLANT

## 2023-12-11 NOTE — Plan of Care (Signed)
  Problem: Education: Goal: Knowledge of General Education information will improve Description: Including pain rating scale, medication(s)/side effects and non-pharmacologic comfort measures 12/11/2023 1954 by Kelli Hope, RN Outcome: Progressing 12/11/2023 1954 by Kelli Hope, RN Outcome: Progressing   Problem: Health Behavior/Discharge Planning: Goal: Ability to manage health-related needs will improve 12/11/2023 1954 by Kelli Hope, RN Outcome: Progressing 12/11/2023 1954 by Kelli Hope, RN Outcome: Progressing   Problem: Clinical Measurements: Goal: Ability to maintain clinical measurements within normal limits will improve 12/11/2023 1954 by Kelli Hope, RN Outcome: Progressing 12/11/2023 1954 by Kelli Hope, RN Outcome: Progressing Goal: Will remain free from infection 12/11/2023 1954 by Kelli Hope, RN Outcome: Progressing 12/11/2023 1954 by Kelli Hope, RN Outcome: Progressing Goal: Diagnostic test results will improve 12/11/2023 1954 by Kelli Hope, RN Outcome: Progressing 12/11/2023 1954 by Kelli Hope, RN Outcome: Progressing Goal: Respiratory complications will improve 12/11/2023 1954 by Kelli Hope, RN Outcome: Progressing 12/11/2023 1954 by Kelli Hope, RN Outcome: Progressing Goal: Cardiovascular complication will be avoided 12/11/2023 1954 by Kelli Hope, RN Outcome: Progressing 12/11/2023 1954 by Kelli Hope, RN Outcome: Progressing   Problem: Activity: Goal: Risk for activity intolerance will decrease 12/11/2023 1954 by Kelli Hope, RN Outcome: Progressing 12/11/2023 1954 by Kelli Hope, RN Outcome: Progressing   Problem: Nutrition: Goal: Adequate nutrition will be maintained 12/11/2023 1954 by Kelli Hope, RN Outcome: Progressing 12/11/2023 1954 by Kelli Hope, RN Outcome: Progressing   Problem: Coping: Goal: Level of anxiety will decrease 12/11/2023 1954 by Kelli Hope, RN Outcome: Progressing 12/11/2023 1954 by Kelli Hope, RN Outcome: Progressing   Problem: Elimination: Goal: Will not experience complications related to bowel motility 12/11/2023 1954 by Kelli Hope, RN Outcome: Progressing 12/11/2023 1954 by Kelli Hope, RN Outcome: Progressing Goal: Will not experience complications related to urinary retention 12/11/2023 1954 by Kelli Hope, RN Outcome: Progressing 12/11/2023 1954 by Kelli Hope, RN Outcome: Progressing   Problem: Pain Managment: Goal: General experience of comfort will improve and/or be controlled 12/11/2023 1954 by Kelli Hope, RN Outcome: Progressing 12/11/2023 1954 by Kelli Hope, RN Outcome: Progressing   Problem: Safety: Goal: Ability to remain free from injury will improve 12/11/2023 1954 by Kelli Hope, RN Outcome: Progressing 12/11/2023 1954 by Kelli Hope, RN Outcome: Progressing   Problem: Skin Integrity: Goal: Risk for impaired skin integrity will decrease 12/11/2023 1954 by Kelli Hope, RN Outcome: Progressing 12/11/2023 1954 by Kelli Hope, RN Outcome: Progressing

## 2023-12-11 NOTE — Anesthesia Postprocedure Evaluation (Signed)
Anesthesia Post Note  Patient: Administrator, arts  Procedure(s) Performed: DENTAL EXTRACTIONS # TWO, SIX, SEVEN, EIGHT, NINE, ELEVEN, THIRTEEN, FIFTEEN, NINETEEN & ALVEOLOPLASTY (Mouth)     Patient location during evaluation: PACU Anesthesia Type: General Level of consciousness: sedated and patient cooperative Pain management: pain level controlled Vital Signs Assessment: post-procedure vital signs reviewed and stable Respiratory status: spontaneous breathing Cardiovascular status: stable Anesthetic complications: no   No notable events documented.  Last Vitals:  Vitals:   12/11/23 1230 12/11/23 1245  BP: (!) 159/86 (!) 153/82  Pulse: (!) 137 (!) 129  Resp: 18 17  Temp:    SpO2: 98% 97%    Last Pain:  Vitals:   12/11/23 1215  TempSrc:   PainSc: Asleep                 Lewie Loron

## 2023-12-11 NOTE — Plan of Care (Signed)

## 2023-12-11 NOTE — Transfer of Care (Signed)
Immediate Anesthesia Transfer of Care Note  Patient: Vanessa Case  Procedure(s) Performed: DENTAL EXTRACTIONS # TWO, SIX, SEVEN, EIGHT, NINE, ELEVEN, THIRTEEN, FIFTEEN, NINETEEN & ALVEOLOPLASTY (Mouth)  Patient Location: PACU  Anesthesia Type:General  Level of Consciousness: awake, drowsy, and patient cooperative  Airway & Oxygen Therapy: Patient Spontanous Breathing and Patient connected to face mask oxygen  Post-op Assessment: Report given to RN and Post -op Vital signs reviewed and stable  Post vital signs: Reviewed and stable  Last Vitals:  Vitals Value Taken Time  BP 145/87 12/11/23 1215  Temp    Pulse 120 12/11/23 1219  Resp 18 12/11/23 1219  SpO2 100 % 12/11/23 1219  Vitals shown include unfiled device data.  Last Pain:  Vitals:   12/11/23 0849  TempSrc:   PainSc: 7       Patients Stated Pain Goal: 2 (12/11/23 0849)  Complications: No notable events documented.

## 2023-12-11 NOTE — Op Note (Signed)
[name], Case MEDICAL RECORD NO: [id number] ACCOUNT NO: [id number] DATE OF BIRTH: 1971-09-26 FACILITY: [facility] LOCATION: [facility] PHYSICIAN: Georgia Lopes, DDS  Operative Report   DATE OF PROCEDURE: 12/11/2023  PREOPERATIVE DIAGNOSIS:  Nonrestorable teeth numbers 2, 6, 7, 8, 9, 11, 13, 15, and 19.  POSTOPERATIVE DIAGNOSIS:  Nonrestorable teeth numbers 2, 6, 7, 8, 9, 11, 13, 15, and 19.  PROCEDURE:  Extraction of teeth numbers 2, 6, 7, 8, 9, 11, 13, 15, and 19, alveoloplasty, left maxilla.  SURGEON:  Georgia Lopes, DDS  ANESTHESIA:  Nasal intubation.  General, Dr. Renold Don, attending.  DESCRIPTION OF PROCEDURE:  The patient was taken to the operating room and placed on the table in the supine position.  A nasal endotracheal tube was placed and secured.  The eyes were protected.  The patient was draped for surgery and a timeout was  performed.  The posterior pharynx was suctioned and a throat pack was placed.  2% lidocaine 1:100,000 epinephrine was infiltrated in an inferior alveolar block on the left side in the mandible and in buccal and palatal infiltration around the maxillary  teeth.  A total of 15 mL was utilized.  A bite block was placed on the right side of the mouth.  The left side was operated first.  A #15 blade was used to make an incision around tooth #19 and around teeth numbers 15, 13, 11, 9, 8, and 7.  The  periosteum was reflected from around these teeth.  Tooth #19 was elevated with a 301 elevator and removed in pieces with the dental forceps.  The socket was curetted, irrigated, and closed with 3-0 chromic.  Then, tooth #15 was elevated, but could not be  removed with the forceps.  The tooth fractured upon attempted removal necessitating the use of the Stryker handpiece with the fissure bur under irrigation to section the tooth and to remove the roots one at a time.  Bone was removed around the residual  roots of number 11 and 13 and then these were elevated and  removed with the 301 elevator.  Then, teeth numbers 9, 8, and 7 were removed after elevating with the 301 elevator by using the dental forceps.  The sockets were then curetted.  The periosteum  was reflected to expose the alveolar crest, which was irregular in contour.  Alveoloplasty was performed using an egg burr and the Stryker handpiece under irrigation.  Then, the left maxilla was irrigated and closed with 3-0 chromic.  Then, the bite  block was repositioned.  Attention was turned to the right side.  Tooth numbers 2 and 6 were extracted using the 15 blade for a full-thickness flap around #6 and an envelope incision overlying tooth #2, which was unexposed to the oral cavity.  The  periosteum was reflected around tooth #6.  It was elevated with a 301 elevator and removed from the mouth with the dental forceps.  Then, the periosteal flap was reflected over tooth #2.  The Stryker handpiece was used to remove bone from around the  remaining roots and the roots were sectioned and removed in pieces with the 301 elevator and the rongeurs.  Then, the sockets were curetted, irrigated, and closed with 3-0 chromic.  The oral cavity was then irrigated and suctioned.  The throat pack was  removed.  The patient was left in care of anesthesia for extubation and transport to recovery and 24-hour Obs.  The patient tolerated the procedure well.  There were no  specimens, drains.  Counts were correct.   PUS D: 12/11/2023 12:01:35 pm T: 12/11/2023 12:32:00 pm  JOB: [id number]/ [id number]

## 2023-12-11 NOTE — Op Note (Signed)
12/11/2023  11:57 AM  PATIENT:  [name]  53 y.o. female  PRE-OPERATIVE DIAGNOSIS:  NON RESTORABLE TEETH #'S 2, 6, 7, 8, 9, 11, 13, 15, 19.  POST-OPERATIVE DIAGNOSIS:  SAME  PROCEDURE:  Procedure(s): EXTRACTIONS TEETH #'S 2, 6, 7, 8, 9, 11, 13, 15, 19. ALVEOLOPLASTY LEFT MAXILLA.  SURGEON:  Surgeon(s): Ocie Doyne, DMD  ANESTHESIA:   local and general  EBL:  minimal  DRAINS: none   SPECIMEN:  No Specimen  COUNTS:  YES  PLAN OF CARE: Discharge to home after PACU  PATIENT DISPOSITION:  PACU - hemodynamically stable.   PROCEDURE DETAILS: Dictation # [id number]  Georgia Lopes, DMD 12/11/2023 11:57 AM

## 2023-12-11 NOTE — Anesthesia Procedure Notes (Signed)
Procedure Name: Intubation Date/Time: 12/11/2023 11:11 AM  Performed by: Eulah Pont, CRNAPre-anesthesia Checklist: Patient identified, Emergency Drugs available, Suction available and Patient being monitored Patient Re-evaluated:Patient Re-evaluated prior to induction Oxygen Delivery Method: Circle System Utilized Preoxygenation: Pre-oxygenation with 100% oxygen Induction Type: IV induction Ventilation: Mask ventilation without difficulty Laryngoscope Size: Glidescope and 3 Grade View: Grade II Nasal Tubes: Nasal Rae, Nasal prep performed, Magill forceps - small, utilized and Right Tube size: 7.0 mm Number of attempts: 1 Airway Equipment and Method: Video-laryngoscopy (12 Fr red rubber catheter) Placement Confirmation: ETT inserted through vocal cords under direct vision, positive ETCO2 and breath sounds checked- equal and bilateral Secured at: 25 cm Tube secured with: Tape Dental Injury: Teeth and Oropharynx as per pre-operative assessment

## 2023-12-11 NOTE — H&P (Signed)
 H&P documentation  -History and Physical Reviewed  -Patient has been re-examined  -No change in the plan of care  Vanessa Case

## 2023-12-11 NOTE — Progress Notes (Signed)
Dr. Renold Don made aware of patient's elevated heart rate upon arrival to Short Stay- 132 at 0810. Patient denies any complaints.  Heart Rate at 0853= 115. No new orders received.

## 2023-12-12 ENCOUNTER — Other Ambulatory Visit (HOSPITAL_COMMUNITY): Payer: Self-pay

## 2023-12-12 ENCOUNTER — Encounter (HOSPITAL_COMMUNITY): Payer: Self-pay | Admitting: Oral Surgery

## 2023-12-12 DIAGNOSIS — K0889 Other specified disorders of teeth and supporting structures: Secondary | ICD-10-CM | POA: Diagnosis not present

## 2023-12-12 MED ORDER — AMOXICILLIN 500 MG PO CAPS
500.0000 mg | ORAL_CAPSULE | Freq: Three times a day (TID) | ORAL | 0 refills | Status: DC
  Filled 2023-12-12: qty 21, 7d supply, fill #0

## 2023-12-12 MED ORDER — HYDROCODONE-ACETAMINOPHEN 5-325 MG PO TABS
1.0000 | ORAL_TABLET | ORAL | 0 refills | Status: AC | PRN
Start: 2023-12-12 — End: 2023-12-15
  Filled 2023-12-12: qty 24, 2d supply, fill #0

## 2023-12-12 NOTE — Progress Notes (Signed)
Pt transportation has been called and will pick her up at entrance A around 1105. Pt receive confirmation number.

## 2023-12-12 NOTE — Discharge Summary (Signed)
Patient ID: Vanessa Case MRN: [id number] DOB/AGE: 24-Mar-1971 53 y.o.  Admit date: 12/11/2023 Discharge date: 12/12/2023  Admission Diagnoses: Post operative state  Discharge Diagnoses:  Principal Problem:   Post-operative state   Discharged Condition: good  Hospital Course: Admitted for 23 hour observation after surgery  Consults: None  Significant Diagnostic Studies: labs: Pre-op labs  Treatments: analgesia: Vicodin and surgery: Dental extractions  Discharge Exam: Blood pressure (!) 140/87, pulse (!) 107, temperature (!) 97.5 F (36.4 C), temperature source Oral, resp. rate 18, height 5\' 5"  (1.651 m), weight 83.5 kg, last menstrual period 10/07/2011, SpO2 100%. Discharge Instructions     Call MD for:  difficulty breathing, headache or visual disturbances   Complete by: As directed    Call MD for:  persistant nausea and vomiting   Complete by: As directed    Call MD for:  severe uncontrolled pain   Complete by: As directed    Call MD for:  temperature >100.4   Complete by: As directed    Diet - low sodium heart healthy   Complete by: As directed    Discharge instructions   Complete by: As directed    Take prescription pain medicine as needed. May add 1 Tylenol and/or 2 ibuprofen if additional pain relief needed.  Ice to affected area for 2-3 days. Off/on every 30 minutes while awake Some bleeding is normal. Bite on moistened gauze with firm pressure for 30 minutes and change as needed. When bleeding has stopped, remove gauze from mouth.  Soft diet, advance as tolerated. Warm salt water mouth rinses 4-5 times per day starting the day after surgery. No smoking or vaping for 2 weeks. For problems, call [phone].   Increase activity slowly   Complete by: As directed    No wound care   Complete by: As directed        Disposition: Discharge disposition: 01-Home or Self Care       Discharge Instructions     Call MD for:  difficulty breathing,  headache or visual disturbances   Complete by: As directed    Call MD for:  persistant nausea and vomiting   Complete by: As directed    Call MD for:  severe uncontrolled pain   Complete by: As directed    Call MD for:  temperature >100.4   Complete by: As directed    Diet - low sodium heart healthy   Complete by: As directed    Discharge instructions   Complete by: As directed    Take prescription pain medicine as needed. May add 1 Tylenol and/or 2 ibuprofen if additional pain relief needed.  Ice to affected area for 2-3 days. Off/on every 30 minutes while awake Some bleeding is normal. Bite on moistened gauze with firm pressure for 30 minutes and change as needed. When bleeding has stopped, remove gauze from mouth.  Soft diet, advance as tolerated. Warm salt water mouth rinses 4-5 times per day starting the day after surgery. No smoking or vaping for 2 weeks. For problems, call [phone].   Increase activity slowly   Complete by: As directed    No wound care   Complete by: As directed       Allergies as of 12/12/2023       Reactions   Percocet [oxycodone-acetaminophen] Hives, Itching, Palpitations, Other (See Comments)   nightmares   Ambien [zolpidem] Other (See Comments)   Sleepwalking episodes        Medication List  TAKE these medications    amLODipine 5 MG tablet Commonly known as: NORVASC Take 1 tablet (5 mg total) by mouth daily.   amoxicillin 500 MG capsule Commonly known as: AMOXIL Take 1 capsule (500 mg total) by mouth 3 (three) times daily.   aspirin EC 81 MG tablet Take 1 tablet (81 mg total) by mouth daily.   empagliflozin 10 MG Tabs tablet Commonly known as: JARDIANCE Take 1 tablet (10 mg total) by mouth daily before breakfast.   ezetimibe 10 MG tablet Commonly known as: ZETIA Take 1 tablet (10 mg total) by mouth daily.   FLUoxetine 20 MG capsule Commonly known as: PROzac Take 3 capsules (60 mg total) by mouth daily.   fluticasone  44 MCG/ACT inhaler Commonly known as: FLOVENT HFA Inhale 2 puffs into the lungs 2 (two) times daily as needed (for shortness of breath).   gabapentin 300 MG capsule Commonly known as: NEURONTIN Take 2 capsules (600 mg total) by mouth 3 (three) times daily. TAKE 2 CAPSULE BY MOUTH IN THE MORNING, 2 CAPSULE IN THE EVENING, AND 2 CAPSULES AT NIGHT   HYDROcodone-acetaminophen 5-325 MG tablet Commonly known as: NORCO/VICODIN Take 1-2 tablets by mouth every 4 (four) hours as needed for up to 3 days for severe pain (pain score 7-10).   hydrOXYzine 25 MG tablet Commonly known as: ATARAX Take 1 tablet (25 mg total) by mouth daily. What changed:  when to take this reasons to take this   ibuprofen 200 MG tablet Commonly known as: ADVIL Take 1,000 mg by mouth every 8 (eight) hours as needed for moderate pain (pain score 4-6).   irbesartan 300 MG tablet Commonly known as: Avapro Take 1 tablet (300 mg total) by mouth daily.   metFORMIN 1000 MG tablet Commonly known as: GLUCOPHAGE Take 1 tablet (1,000 mg total) by mouth 2 (two) times daily with a meal.   mirtazapine 15 MG tablet Commonly known as: REMERON Take 1 tablet (15 mg total) by mouth at bedtime.   naltrexone 50 MG tablet Commonly known as: DEPADE Take 50 mg by mouth daily as needed (For urges and cravings).   ONE-A-DAY WOMENS FORMULA PO Take 1 tablet by mouth daily.   prazosin 1 MG capsule Commonly known as: MINIPRESS Take 1 capsule (1 mg total) by mouth at bedtime. To prevent nightmares   QUEtiapine 50 MG tablet Commonly known as: SEROQUEL Take 1 tablet (50 mg total) by mouth at bedtime.   Semaglutide (1 MG/DOSE) 4 MG/3ML Sopn Inject 1 mg into the skin once a week. What changed: how much to take   traZODone 50 MG tablet Commonly known as: DESYREL Take 50 mg by mouth at bedtime as needed for sleep.         Signed: Ocie Doyne 12/12/2023, 10:44 AM

## 2023-12-12 NOTE — Plan of Care (Signed)
   Problem: Clinical Measurements: Goal: Will remain free from infection Outcome: Progressing   Problem: Pain Managment: Goal: General experience of comfort will improve and/or be controlled Outcome: Progressing   Problem: Safety: Goal: Ability to remain free from injury will improve Outcome: Progressing

## 2023-12-12 NOTE — Plan of Care (Signed)

## 2023-12-12 NOTE — Progress Notes (Signed)
 Discharge instructions (including medications) discussed with and copy provided to patient/caregiver

## 2023-12-29 ENCOUNTER — Telehealth: Payer: Self-pay | Admitting: Cardiovascular Disease

## 2023-12-29 NOTE — Telephone Encounter (Signed)
 It is best for her to be evaluated first, including ECG, before we change her medications.  I believe she is Dr. Di Kindle patient and I just saw the one time as DOD.

## 2023-12-29 NOTE — Telephone Encounter (Signed)
 STAT if HR is under 50 or over 120 (normal HR is 60-100 beats per minute)  What is your heart rate? My heart stays between 109-111standing still and 126-145 walking up the stairs.   Do you have a log of your heart rate readings (document readings)? Yes. I have documentation.   Do you have any other symptoms? Shortness of breath.   Answers received via pt schedules. Please advise.

## 2023-12-29 NOTE — Telephone Encounter (Signed)
 Patient identification verified by 2 forms. Marilynn Rail, RN    Called and spoke to patient  Patient states:   -had dental procedure on 2/20, during the dental procedure they saw heart rate was elevated   -surgeon advised her to report elevated heart rate to cardiology  -due to tachycardia she has hard time walking with out stopping   -she develops SOB when walking   -has heart palpitations at rest and when walking  -This morning BP: 132/84 HR: 117  -was previously on medication to help with heart rate 8-9 years ago   -had a relapse of drug use and provider d/c medication   -she has been in recovery for the past year, would like to resume medications  Patient denies:  -chest pain   Patient requests OV on 3/18 due to other doctors appointment, scheduled for 3/18 at 2:45 with PA Lisabeth Devoid  Reviewed ED warning signs/precautions  Patient verbalized understanding, no questions at this time

## 2023-12-31 NOTE — Telephone Encounter (Signed)
 Reviewed for Dr. Cristal Deer as she is out of office, agree with recommendation for in office evaluation.  Alver Sorrow, NP

## 2024-01-01 ENCOUNTER — Ambulatory Visit: Payer: MEDICAID | Admitting: Orthopaedic Surgery

## 2024-01-05 ENCOUNTER — Encounter: Payer: MEDICAID | Admitting: Student

## 2024-01-06 ENCOUNTER — Ambulatory Visit: Payer: MEDICAID | Admitting: Physician Assistant

## 2024-01-06 ENCOUNTER — Encounter (INDEPENDENT_AMBULATORY_CARE_PROVIDER_SITE_OTHER): Payer: MEDICAID | Admitting: Podiatry

## 2024-01-06 DIAGNOSIS — Z91199 Patient's noncompliance with other medical treatment and regimen due to unspecified reason: Secondary | ICD-10-CM

## 2024-01-07 NOTE — Progress Notes (Unsigned)
 Office Visit Note   Patient: Vanessa Case           Date of Birth: Dec 04, 1970           MRN: [id number] Visit Date: 01/08/2024              Requested by:  Olegario Messier, MD [address] [place],  [place] [address]  PCP: Olegario Messier, MD   Assessment & Plan: Visit Diagnoses: No diagnosis found.  Plan: ***  Follow-Up Instructions: No follow-ups on file.   Orders:  No orders of the defined types were placed in this encounter. No orders of the defined types were placed in this encounter.    Procedures: No procedures performed   Clinical Data: No additional findings.   Subjective: No chief complaint on file.  HPI  Review of Systems   Objective: Vital Signs: LMP 10/07/2011   Physical Exam  Ortho Exam  Specialty Comments:  No specialty comments available.  Imaging: No results found.   PMFS History: Patient Active Problem List   Diagnosis Date Noted  . Post-operative state 12/11/2023  . Urinary frequency 11/17/2023  . Partial thickness rotator cuff tear 08/16/2023  . GAD (generalized anxiety disorder) 08/13/2023  . PTSD (post-traumatic stress disorder) 08/13/2023  . Cannabis use disorder, severe, dependence (HCC) 08/13/2023  . Lumbar radiculopathy 03/31/2023  . Postherpetic neuralgia 12/24/2022  . Substance abuse (HCC) 02/08/2020  . NSTEMI (non-ST elevated myocardial infarction) (HCC) 02/05/2020  . Major depressive disorder, recurrent, severe without psychotic features (HCC)   . Type 2 diabetes mellitus with diabetic polyneuropathy (HCC) 05/30/2015  . Essential hypertension 05/30/2015  . Hyperlipidemia 05/30/2015  . Bipolar 2 disorder, major depressive episode (HCC) 09/20/2014   Past Medical History:  Diagnosis Date  . Anemia   . Anxiety   . Arthritis   . Cholelithiasis with acute cholecystitis 06/28/2013  . Chronic abdominal pain   . Chronic back pain   . Chronic headache   . Chronic neck pain   . Coronary artery disease   .  Depression   . Diabetes mellitus    adult onset dm-maintained on glipizide and metformin, pt states fasting glucose runs 110  . Fibromyalgia   . GERD (gastroesophageal reflux disease)   . HLD (hyperlipidemia) 05/30/2015  . Hyperlipidemia   . Hypertension    maintained on lisinopril hct, metoprolol-134/86 at pat visit  . Neuromuscular disorder (HCC)   . Neuropathy   . NSTEMI (non-ST elevated myocardial infarction) (HCC) 02/05/2020  . Obesity   . PONV (postoperative nausea and vomiting)   . PTSD (post-traumatic stress disorder)   . Right arm weakness 03/05/2013  . Substance abuse (HCC)     Family History  Problem Relation Age of Onset  . Diabetes Mother   . Atrial fibrillation Mother   . Cystic fibrosis Father   . Diabetes Maternal Grandmother   . Heart disease Maternal Grandfather     Past Surgical History:  Procedure Laterality Date  . ABDOMINAL HYSTERECTOMY  10/30/2011   Procedure: HYSTERECTOMY ABDOMINAL;  Surgeon: Bing Plume, MD;  Location: [facility];  Service: Gynecology;  Laterality: N/A;  . CESAREAN SECTION  x3  . CHOLECYSTECTOMY N/A 06/28/2013   Procedure: LAPAROSCOPIC CHOLECYSTECTOMY;  Surgeon: Shelly Rubenstein, MD;  Location: [facility];  Service: General;  Laterality: N/A;  . CORONARY STENT INTERVENTION N/A 02/07/2020   Procedure: CORONARY STENT INTERVENTION;  Surgeon: Marykay Lex, MD;  Location: [facility] INVASIVE CV LAB;  Service: Cardiovascular;  Laterality: N/A;  .  LEFT HEART CATH AND CORONARY ANGIOGRAPHY N/A 02/07/2020   Procedure: LEFT HEART CATH AND CORONARY ANGIOGRAPHY;  Surgeon: Marykay Lex, MD;  Location: [facility] INVASIVE CV LAB;  Service: Cardiovascular;  Laterality: N/A;  . SALPINGOOPHORECTOMY  10/30/2011   Procedure: SALPINGO OOPHERECTOMY;  Surgeon: Bing Plume, MD;  Location: [facility];  Service: Gynecology;  Laterality: Right;  . TOOTH EXTRACTION N/A 12/11/2023   Procedure: DENTAL EXTRACTIONS # TWO, SIX, SEVEN, EIGHT, NINE, ELEVEN, THIRTEEN, FIFTEEN,  NINETEEN & ALVEOLOPLASTY;  Surgeon: Ocie Doyne, DMD;  Location: [facility] OR;  Service: Oral Surgery;  Laterality: N/A;  . WRIST SURGERY Left    carpel tunnel and tendonitis   Social History   Occupational History  . Not on file  Tobacco Use  . Smoking status: Former    Current packs/day: 0.00    Types: Cigarettes    Quit date: 01/2020    Years since quitting: 3.9  . Smokeless tobacco: Never  . Tobacco comments:    has smoked for 14 years total - quit for 6 years at one point but has been smoking the last 3 years  Vaping Use  . Vaping status: Never Used  Substance and Sexual Activity  . Alcohol use: Not Currently    Alcohol/week: 0.0 standard drinks of alcohol  . Drug use: Yes    Types: Cocaine, Marijuana, "Crack" cocaine    Comment: last used cocaine 6/19 at 1800 and marijuana 6/20 0700  . Sexual activity: Not on file

## 2024-01-08 ENCOUNTER — Ambulatory Visit (INDEPENDENT_AMBULATORY_CARE_PROVIDER_SITE_OTHER): Payer: MEDICAID | Admitting: Orthopaedic Surgery

## 2024-01-08 DIAGNOSIS — M25511 Pain in right shoulder: Secondary | ICD-10-CM

## 2024-01-08 DIAGNOSIS — G8929 Other chronic pain: Secondary | ICD-10-CM | POA: Diagnosis not present

## 2024-01-08 NOTE — Progress Notes (Signed)
 Patient did not show for scheduled appointment today. This encounter was created in error - please disregard.

## 2024-01-14 ENCOUNTER — Other Ambulatory Visit: Payer: Self-pay | Admitting: Student

## 2024-01-14 DIAGNOSIS — E1142 Type 2 diabetes mellitus with diabetic polyneuropathy: Secondary | ICD-10-CM

## 2024-01-14 MED ORDER — GABAPENTIN 300 MG PO CAPS: 600.0000 mg | ORAL_CAPSULE | Freq: Three times a day (TID) | ORAL | 1 refills | Status: DC

## 2024-01-14 NOTE — Telephone Encounter (Signed)
 Copied from CRM [id number]. Topic: Clinical - Medication Refill >> Jan 14, 2024 10:55 AM Yvone Neu wrote: Most Recent Primary Care Visit:  Provider: Faith Rogue  Department: [facility]  Visit Type: OPEN ESTABLISHED  Date: 11/17/2023  Medication: gabapentin (NEURONTIN) 300 MG capsule  Has the patient contacted their pharmacy? Yes (Agent: If no, request that the patient contact the pharmacy for the refill. If patient does not wish to contact the pharmacy document the reason why and proceed with request.) (Agent: If yes, when and what did the pharmacy advise?)  Is this the correct pharmacy for this prescription? Yes If no, delete pharmacy and type the correct one.  This is the patient's preferred pharmacy:  [id number] - [place], [place] - [address] AT [facility] OF [address] [place] [address] Phone: [phone] Fax: [phone]  [facility] [address] [place] [address] Phone: [phone] Fax: [phone]   Has the prescription been filled recently? Yes  Is the patient out of the medication? No  Has the patient been seen for an appointment in the last year OR does the patient have an upcoming appointment? Yes  Can we respond through MyChart? Yes  Agent: Please be advised that Rx refills may take up to 3 business days. We ask that you follow-up with your pharmacy.

## 2024-02-20 ENCOUNTER — Encounter: Payer: MEDICAID | Admitting: Obstetrics & Gynecology

## 2024-03-25 ENCOUNTER — Encounter (HOSPITAL_COMMUNITY): Payer: Self-pay | Admitting: *Deleted

## 2024-03-25 ENCOUNTER — Other Ambulatory Visit: Payer: Self-pay

## 2024-03-25 ENCOUNTER — Inpatient Hospital Stay (HOSPITAL_COMMUNITY)
Admission: EM | Admit: 2024-03-25 | Discharge: 2024-03-27 | DRG: 287 | Disposition: A | Discharge status: Home / Self Care | Payer: MEDICAID | Attending: Internal Medicine | Admitting: Internal Medicine

## 2024-03-25 ENCOUNTER — Emergency Department (HOSPITAL_COMMUNITY): Payer: MEDICAID

## 2024-03-25 DIAGNOSIS — Z7951 Long term (current) use of inhaled steroids: Secondary | ICD-10-CM | POA: Diagnosis not present

## 2024-03-25 DIAGNOSIS — E785 Hyperlipidemia, unspecified: Secondary | ICD-10-CM | POA: Diagnosis present

## 2024-03-25 DIAGNOSIS — R112 Nausea with vomiting, unspecified: Secondary | ICD-10-CM | POA: Diagnosis present

## 2024-03-25 DIAGNOSIS — I214 Non-ST elevation (NSTEMI) myocardial infarction: Principal | ICD-10-CM | POA: Diagnosis present

## 2024-03-25 DIAGNOSIS — Z886 Allergy status to analgesic agent status: Secondary | ICD-10-CM

## 2024-03-25 DIAGNOSIS — Z8349 Family history of other endocrine, nutritional and metabolic diseases: Secondary | ICD-10-CM

## 2024-03-25 DIAGNOSIS — I25110 Atherosclerotic heart disease of native coronary artery with unstable angina pectoris: Secondary | ICD-10-CM | POA: Diagnosis not present

## 2024-03-25 DIAGNOSIS — I200 Unstable angina: Secondary | ICD-10-CM

## 2024-03-25 DIAGNOSIS — F419 Anxiety disorder, unspecified: Secondary | ICD-10-CM | POA: Diagnosis present

## 2024-03-25 DIAGNOSIS — X500XXA Overexertion from strenuous movement or load, initial encounter: Secondary | ICD-10-CM

## 2024-03-25 DIAGNOSIS — Z90710 Acquired absence of both cervix and uterus: Secondary | ICD-10-CM

## 2024-03-25 DIAGNOSIS — K219 Gastro-esophageal reflux disease without esophagitis: Secondary | ICD-10-CM | POA: Diagnosis present

## 2024-03-25 DIAGNOSIS — D649 Anemia, unspecified: Secondary | ICD-10-CM | POA: Diagnosis present

## 2024-03-25 DIAGNOSIS — Z7984 Long term (current) use of oral hypoglycemic drugs: Secondary | ICD-10-CM | POA: Diagnosis not present

## 2024-03-25 DIAGNOSIS — Z8249 Family history of ischemic heart disease and other diseases of the circulatory system: Secondary | ICD-10-CM | POA: Diagnosis not present

## 2024-03-25 DIAGNOSIS — I252 Old myocardial infarction: Secondary | ICD-10-CM | POA: Diagnosis not present

## 2024-03-25 DIAGNOSIS — F3181 Bipolar II disorder: Secondary | ICD-10-CM | POA: Diagnosis present

## 2024-03-25 DIAGNOSIS — E1142 Type 2 diabetes mellitus with diabetic polyneuropathy: Secondary | ICD-10-CM | POA: Diagnosis present

## 2024-03-25 DIAGNOSIS — F4310 Post-traumatic stress disorder, unspecified: Secondary | ICD-10-CM | POA: Diagnosis present

## 2024-03-25 DIAGNOSIS — Z833 Family history of diabetes mellitus: Secondary | ICD-10-CM | POA: Diagnosis not present

## 2024-03-25 DIAGNOSIS — M542 Cervicalgia: Secondary | ICD-10-CM | POA: Diagnosis present

## 2024-03-25 DIAGNOSIS — Z955 Presence of coronary angioplasty implant and graft: Secondary | ICD-10-CM

## 2024-03-25 DIAGNOSIS — Z5986 Financial insecurity: Secondary | ICD-10-CM | POA: Diagnosis not present

## 2024-03-25 DIAGNOSIS — Z79899 Other long term (current) drug therapy: Secondary | ICD-10-CM | POA: Diagnosis not present

## 2024-03-25 DIAGNOSIS — G8929 Other chronic pain: Secondary | ICD-10-CM | POA: Diagnosis present

## 2024-03-25 DIAGNOSIS — Z7985 Long-term (current) use of injectable non-insulin antidiabetic drugs: Secondary | ICD-10-CM

## 2024-03-25 DIAGNOSIS — B0229 Other postherpetic nervous system involvement: Secondary | ICD-10-CM | POA: Diagnosis present

## 2024-03-25 DIAGNOSIS — F17210 Nicotine dependence, cigarettes, uncomplicated: Secondary | ICD-10-CM | POA: Diagnosis present

## 2024-03-25 DIAGNOSIS — F332 Major depressive disorder, recurrent severe without psychotic features: Secondary | ICD-10-CM | POA: Diagnosis present

## 2024-03-25 DIAGNOSIS — Z888 Allergy status to other drugs, medicaments and biological substances status: Secondary | ICD-10-CM

## 2024-03-25 DIAGNOSIS — I2510 Atherosclerotic heart disease of native coronary artery without angina pectoris: Secondary | ICD-10-CM | POA: Diagnosis present

## 2024-03-25 DIAGNOSIS — M75101 Unspecified rotator cuff tear or rupture of right shoulder, not specified as traumatic: Secondary | ICD-10-CM | POA: Diagnosis present

## 2024-03-25 DIAGNOSIS — I119 Hypertensive heart disease without heart failure: Secondary | ICD-10-CM | POA: Diagnosis present

## 2024-03-25 DIAGNOSIS — Z7982 Long term (current) use of aspirin: Secondary | ICD-10-CM

## 2024-03-25 DIAGNOSIS — I10 Essential (primary) hypertension: Secondary | ICD-10-CM | POA: Diagnosis not present

## 2024-03-25 DIAGNOSIS — R079 Chest pain, unspecified: Secondary | ICD-10-CM | POA: Diagnosis not present

## 2024-03-25 DIAGNOSIS — M549 Dorsalgia, unspecified: Secondary | ICD-10-CM | POA: Diagnosis present

## 2024-03-25 HISTORY — DX: Unstable angina: I20.0

## 2024-03-25 LAB — CBC
HCT: 35 % — ABNORMAL LOW (ref 36.0–46.0)
Hemoglobin: 11.5 g/dL — ABNORMAL LOW (ref 12.0–15.0)
MCH: 28.3 pg (ref 26.0–34.0)
MCHC: 32.9 g/dL (ref 30.0–36.0)
MCV: 86.2 fL (ref 80.0–100.0)
Platelets: 272 10*3/uL (ref 150–400)
RBC: 4.06 MIL/uL (ref 3.87–5.11)
RDW: 14.5 % (ref 11.5–15.5)
WBC: 6.4 10*3/uL (ref 4.0–10.5)
nRBC: 0 % (ref 0.0–0.2)

## 2024-03-25 LAB — COMPREHENSIVE METABOLIC PANEL WITH GFR
ALT: 14 U/L (ref 0–44)
AST: 17 U/L (ref 15–41)
Albumin: 3.6 g/dL (ref 3.5–5.0)
Alkaline Phosphatase: 46 U/L (ref 38–126)
Anion gap: 10 (ref 5–15)
BUN: 8 mg/dL (ref 6–20)
CO2: 19 mmol/L — ABNORMAL LOW (ref 22–32)
Calcium: 9.4 mg/dL (ref 8.9–10.3)
Chloride: 109 mmol/L (ref 98–111)
Creatinine, Ser: 0.96 mg/dL (ref 0.44–1.00)
GFR, Estimated: >60 mL/min (ref >60)
Glucose, Bld: 97 mg/dL (ref 70–99)
Potassium: 3.9 mmol/L (ref 3.5–5.1)
Sodium: 138 mmol/L (ref 135–145)
Total Bilirubin: 0.8 mg/dL (ref 0.0–1.2)
Total Protein: 6.8 g/dL (ref 6.5–8.1)

## 2024-03-25 LAB — HEMOGLOBIN A1C
Hgb A1c MFr Bld: 6.9 % — ABNORMAL HIGH (ref 4.8–5.6)
Mean Plasma Glucose: 151.33 mg/dL

## 2024-03-25 LAB — RAPID URINE DRUG SCREEN, HOSP PERFORMED
Amphetamines: NOT DETECTED
Barbiturates: NOT DETECTED
Benzodiazepines: NOT DETECTED
Cocaine: NOT DETECTED
Opiates: NOT DETECTED
Tetrahydrocannabinol: POSITIVE — AB

## 2024-03-25 LAB — TROPONIN I (HIGH SENSITIVITY)
Troponin I (High Sensitivity): 3 ng/L (ref <18)
Troponin I (High Sensitivity): 4 ng/L (ref <18)

## 2024-03-25 LAB — CBG MONITORING, ED: Glucose-Capillary: 146 mg/dL — ABNORMAL HIGH (ref 70–99)

## 2024-03-25 MED ORDER — PRAZOSIN HCL 1 MG PO CAPS
1.0000 mg | ORAL_CAPSULE | Freq: Every day | ORAL | Status: DC
  Administered 2024-03-25 – 2024-03-26 (×2): 1 mg via ORAL
  Filled 2024-03-25 (×4): qty 1

## 2024-03-25 MED ORDER — INSULIN ASPART 100 UNIT/ML IJ SOLN
0.0000 [IU] | Freq: Three times a day (TID) | INTRAMUSCULAR | Status: DC
  Administered 2024-03-27: 3 [IU] via SUBCUTANEOUS

## 2024-03-25 MED ORDER — EZETIMIBE 10 MG PO TABS
10.0000 mg | ORAL_TABLET | Freq: Every day | ORAL | Status: DC
  Administered 2024-03-26 – 2024-03-27 (×2): 10 mg via ORAL
  Filled 2024-03-25 (×2): qty 1

## 2024-03-25 MED ORDER — QUETIAPINE FUMARATE 50 MG PO TABS
50.0000 mg | ORAL_TABLET | Freq: Every day | ORAL | Status: DC
  Administered 2024-03-25 – 2024-03-26 (×2): 50 mg via ORAL
  Filled 2024-03-25: qty 2
  Filled 2024-03-25: qty 1

## 2024-03-25 MED ORDER — ASPIRIN 81 MG PO TBEC
81.0000 mg | DELAYED_RELEASE_TABLET | Freq: Every day | ORAL | Status: DC
  Administered 2024-03-25 – 2024-03-27 (×3): 81 mg via ORAL
  Filled 2024-03-25 (×3): qty 1

## 2024-03-25 MED ORDER — AMLODIPINE BESYLATE 5 MG PO TABS: 5.0000 mg | ORAL_TABLET | Freq: Every day | ORAL | Status: DC

## 2024-03-25 MED ORDER — EMPAGLIFLOZIN 10 MG PO TABS
10.0000 mg | ORAL_TABLET | Freq: Every day | ORAL | Status: DC
  Administered 2024-03-27: 10 mg via ORAL
  Filled 2024-03-25 (×3): qty 1

## 2024-03-25 MED ORDER — HEPARIN (PORCINE) 25000 UT/250ML-% IV SOLN
1325.0000 [IU]/h | INTRAVENOUS | Status: DC
  Administered 2024-03-25: 900 [IU]/h via INTRAVENOUS
  Administered 2024-03-26: 1150 [IU]/h via INTRAVENOUS
  Filled 2024-03-25 (×2): qty 250

## 2024-03-25 MED ORDER — FLUOXETINE HCL 20 MG PO CAPS
60.0000 mg | ORAL_CAPSULE | Freq: Every day | ORAL | Status: DC
  Administered 2024-03-26 – 2024-03-27 (×2): 60 mg via ORAL
  Filled 2024-03-25 (×2): qty 3

## 2024-03-25 MED ORDER — TRAZODONE HCL 50 MG PO TABS
50.0000 mg | ORAL_TABLET | Freq: Every evening | ORAL | Status: DC | PRN
  Administered 2024-03-25: 50 mg via ORAL
  Filled 2024-03-25: qty 1

## 2024-03-25 MED ORDER — HEPARIN BOLUS VIA INFUSION
4000.0000 [IU] | Freq: Once | INTRAVENOUS | Status: AC
  Administered 2024-03-25: 4000 [IU] via INTRAVENOUS
  Filled 2024-03-25: qty 4000

## 2024-03-25 MED ORDER — ISOSORBIDE MONONITRATE ER 30 MG PO TB24
30.0000 mg | ORAL_TABLET | Freq: Every day | ORAL | Status: DC
  Administered 2024-03-25: 30 mg via ORAL
  Filled 2024-03-25: qty 1

## 2024-03-25 MED ORDER — GABAPENTIN 300 MG PO CAPS: 300.0000 mg | ORAL_CAPSULE | Freq: Once | ORAL | Status: DC

## 2024-03-25 MED ORDER — NITROGLYCERIN 0.4 MG SL SUBL
0.4000 mg | SUBLINGUAL_TABLET | SUBLINGUAL | Status: DC | PRN
  Administered 2024-03-27: 0.4 mg via SUBLINGUAL
  Filled 2024-03-25: qty 1

## 2024-03-25 MED ORDER — PANTOPRAZOLE SODIUM 40 MG PO TBEC
40.0000 mg | DELAYED_RELEASE_TABLET | Freq: Once | ORAL | Status: AC
  Administered 2024-03-25: 40 mg via ORAL
  Filled 2024-03-25: qty 1

## 2024-03-25 MED ORDER — AMLODIPINE BESYLATE 5 MG PO TABS
5.0000 mg | ORAL_TABLET | Freq: Every day | ORAL | Status: DC
  Administered 2024-03-25: 5 mg via ORAL
  Filled 2024-03-25: qty 1

## 2024-03-25 MED ORDER — ATORVASTATIN CALCIUM 10 MG PO TABS
20.0000 mg | ORAL_TABLET | Freq: Every day | ORAL | Status: DC
  Administered 2024-03-25: 20 mg via ORAL
  Filled 2024-03-25: qty 2

## 2024-03-25 MED ORDER — GABAPENTIN 300 MG PO CAPS
600.0000 mg | ORAL_CAPSULE | Freq: Three times a day (TID) | ORAL | Status: DC
  Administered 2024-03-25: 600 mg via ORAL
  Filled 2024-03-25: qty 2

## 2024-03-25 MED ORDER — ONDANSETRON HCL 4 MG/2ML IJ SOLN
4.0000 mg | Freq: Once | INTRAMUSCULAR | Status: AC
  Administered 2024-03-25: 4 mg via INTRAVENOUS
  Filled 2024-03-25: qty 2

## 2024-03-25 NOTE — ED Provider Triage Note (Signed)
 Emergency Medicine Provider Triage Evaluation Note  Vanessa Case , a 53 y.o. female  was evaluated in triage.  Pt complains of central chest pain since 10:15AM after lifting heavy bags. Also became lightheaded and vomited after chest pain started. Hx of CAD. Took NTG which brought pain from 9/10 to 6/10.  Review of Systems  Positive:  Negative:   Physical Exam  BP (!) 145/90 (BP Location: Left Arm)   Pulse 93   Temp 98.1 F (36.7 C) (Oral)   Resp 20   LMP 10/07/2011   SpO2 100%  Gen:   Awake, no distress   Resp:  Normal effort  MSK:   Moves extremities without difficulty  Other:    Medical Decision Making  Medically screening exam initiated at 1:56 PM.  Appropriate orders placed.  Keeya Rivkah Wolz was informed that the remainder of the evaluation will be completed by another provider, this initial triage assessment does not replace that evaluation, and the importance of remaining in the ED until their evaluation is complete.    [place], [place] 03/25/24 1357

## 2024-03-25 NOTE — ED Notes (Signed)
 Snack bag given as requested with drink

## 2024-03-25 NOTE — ED Triage Notes (Addendum)
 Pt is here by [facility] for left sided chest pain with radiation to left arm.  Pt has one episode of vomiting with this.  Pt also had sob with this.  Pt has 6 chewable to asa pta, ems gave her sl nitroglycerin  which decreased papin to 6/10.  Prior to sl nitroglycerin  pt was HTN 180/'s, MSE by PA in triage.  CBG 124.

## 2024-03-25 NOTE — ED Provider Notes (Signed)
 [facility] EMERGENCY DEPARTMENT AT [facility] Provider Note   CSN: [id number] Arrival date & time: 03/25/24  1351     History  Chief Complaint  Patient presents with   Chest Pain    Vanessa Case is a 53 y.o. female.  Patient is a 53 year old female with a past medical history of CAD status post stent placement, hypertension, hyperlipidemia, diabetes presenting to the emergency department with chest pain.  The patient states that around 10 to 11 AM this morning she was helping bring in groceries for her mom and had a brief episode of chest pain.  Dates that she felt like she was "hit in the chest by a volleyball and then the pain went away".  She states that then this afternoon she went to [employer] know she was walking in and settling down on her laptop chest pain returned.  She states this felt more like a pressure type of pain that went across her chest.  She states that she felt nauseous but did not vomit, lightheaded and had some shortness of breath.  States that she took 4 baby aspirin  and nitro with mild improvement and called 911.  She states that she was given additional nitro and route by EMS with some improvement with states that the pain is not completely resolved.  She states that this does feel similar to her prior heart attacks.  The history is provided by the patient.  Chest Pain      Home Medications Prior to Admission medications   Medication Sig Start Date End Date Taking? Authorizing Provider  amLODipine  (NORVASC ) 5 MG tablet Take 1 tablet (5 mg total) by mouth daily. 11/17/23 11/16/24  Aurora Lees, DO  amoxicillin  (AMOXIL ) 500 MG capsule Take 1 capsule (500 mg total) by mouth 3 (three) times daily. 12/12/23   Ascencion Lava, DMD  aspirin  EC 81 MG EC tablet Take 1 tablet (81 mg total) by mouth daily. 02/09/20   Sanjuanita Cruz, NP  empagliflozin  (JARDIANCE ) 10 MG TABS tablet Take 1 tablet (10 mg total) by mouth daily before breakfast. 11/17/23    Aurora Lees, DO  ezetimibe  (ZETIA ) 10 MG tablet Take 1 tablet (10 mg total) by mouth daily. 06/16/23   Croitoru, Mihai, MD  FLUoxetine  (PROZAC ) 20 MG capsule Take 3 capsules (60 mg total) by mouth daily. 08/13/23   Joice Nares, MD  fluticasone  (FLOVENT  HFA) 44 MCG/ACT inhaler Inhale 2 puffs into the lungs 2 (two) times daily as needed (for shortness of breath). 03/14/16   Mozell Arias, MD  gabapentin  (NEURONTIN ) 300 MG capsule Take 2 capsules (600 mg total) by mouth 3 (three) times daily. TAKE 2 CAPSULE BY MOUTH IN THE MORNING, 2 CAPSULE IN THE EVENING, AND 2 CAPSULES AT NIGHT 01/14/24 04/13/24  Nooruddin, Saad, MD  hydrOXYzine  (ATARAX ) 25 MG tablet Take 1 tablet (25 mg total) by mouth daily. Patient taking differently: Take 25 mg by mouth daily as needed. 08/13/23   Joice Nares, MD  ibuprofen  (ADVIL ) 200 MG tablet Take 1,000 mg by mouth every 8 (eight) hours as needed for moderate pain (pain score 4-6).    [provider]  irbesartan  (AVAPRO ) 300 MG tablet Take 1 tablet (300 mg total) by mouth daily. 08/15/23 08/14/24  Nooruddin, Saad, MD  metFORMIN  (GLUCOPHAGE ) 1000 MG tablet Take 1 tablet (1,000 mg total) by mouth 2 (two) times daily with a meal. 09/22/23   Nooruddin, Dann Dust, MD  mirtazapine  (REMERON ) 15 MG tablet Take 1 tablet (  15 mg total) by mouth at bedtime. 08/13/23   Joice Nares, MD  Multiple Vitamins-Calcium  (ONE-A-DAY WOMENS FORMULA PO) Take 1 tablet by mouth daily.    [provider]  naltrexone  (DEPADE) 50 MG tablet Take 50 mg by mouth daily as needed (For urges and cravings).    [provider]  prazosin  (MINIPRESS ) 1 MG capsule Take 1 capsule (1 mg total) by mouth at bedtime. To prevent nightmares 08/13/23   Joice Nares, MD  QUEtiapine  (SEROQUEL ) 50 MG tablet Take 1 tablet (50 mg total) by mouth at bedtime. 08/27/23   Kober, Charles E, PA-C  Semaglutide , 1 MG/DOSE, 4 MG/3ML SOPN Inject 1 mg into the skin once a week. Patient taking  differently: Inject 1.5 mg into the skin once a week. 09/01/23   Nooruddin, Saad, MD  traZODone  (DESYREL ) 50 MG tablet Take 50 mg by mouth at bedtime as needed for sleep.    [provider]      Allergies    Percocet [oxycodone -acetaminophen ] and Ambien  [zolpidem ]    Review of Systems   Review of Systems  Cardiovascular:  Positive for chest pain.    Physical Exam Updated Vital Signs BP (!) 173/95   Pulse 90   Temp 97.9 F (36.6 C) (Oral)   Resp 12   Ht 5\' 5"  (1.651 m)   Wt 83.5 kg   LMP 10/07/2011   SpO2 100%   BMI 30.63 kg/m  Physical Exam Vitals and nursing note reviewed.  Constitutional:      General: She is not in acute distress.    Appearance: She is well-developed.  HENT:     Head: Normocephalic.  Eyes:     Extraocular Movements: Extraocular movements intact.  Cardiovascular:     Rate and Rhythm: Normal rate and regular rhythm.     Heart sounds: Normal heart sounds.  Pulmonary:     Effort: Pulmonary effort is normal.     Breath sounds: Normal breath sounds.  Chest:     Chest wall: No tenderness.  Abdominal:     Palpations: Abdomen is soft.     Tenderness: There is no abdominal tenderness.  Musculoskeletal:        General: Normal range of motion.     Cervical back: Normal range of motion and neck supple.     Right lower leg: No edema.     Left lower leg: No edema.  Skin:    General: Skin is warm and dry.  Neurological:     General: No focal deficit present.     Mental Status: She is alert and oriented to person, place, and time.  Psychiatric:        Mood and Affect: Mood normal.        Behavior: Behavior normal.     ED Results / Procedures / Treatments   Labs (all labs ordered are listed, but only abnormal results are displayed) Labs Reviewed  CBC - Abnormal; Notable for the following components:      Result Value   Hemoglobin 11.5 (*)    HCT 35.0 (*)    All other components within normal limits  COMPREHENSIVE METABOLIC PANEL WITH  GFR - Abnormal; Notable for the following components:   CO2 19 (*)    All other components within normal limits  RAPID URINE DRUG SCREEN, HOSP PERFORMED  HEPARIN  LEVEL (UNFRACTIONATED)  CBC  HEPARIN  LEVEL (UNFRACTIONATED)  TROPONIN I (HIGH SENSITIVITY)  TROPONIN I (HIGH SENSITIVITY)    EKG EKG Interpretation Date/Time:  Thursday March 25 2024 14:06:28 EDT Ventricular Rate:  88 PR Interval:  140 QRS Duration:  78 QT Interval:  380 QTC Calculation: 459 R Axis:   58  Text Interpretation: Normal sinus rhythm Normal ECG No significant change since last tracing Confirmed by Celesta Coke ([id number]) on 03/25/2024 4:09:04 PM  Radiology DG Chest 2 View Result Date: 03/25/2024 CLINICAL DATA:  chest pain EXAM: CHEST - 2 VIEW COMPARISON:  November 26, 2023 FINDINGS: No focal airspace consolidation, pleural effusion, or pneumothorax. No cardiomegaly. No acute fracture or destructive lesion. Multilevel thoracic osteophytosis. Cholecystectomy clips. IMPRESSION: No acute cardiopulmonary abnormality. Electronically Signed   By: Rance Burrows M.D.   On: 03/25/2024 15:16    Procedures Procedures    Medications Ordered in ED Medications  nitroGLYCERIN  (NITROSTAT ) SL tablet 0.4 mg (has no administration in time range)  aspirin  EC tablet 81 mg (has no administration in time range)  atorvastatin  (LIPITOR ) tablet 20 mg (has no administration in time range)  isosorbide  mononitrate (IMDUR ) 24 hr tablet 30 mg (has no administration in time range)  amLODipine  (NORVASC ) tablet 5 mg (has no administration in time range)  heparin  bolus via infusion 4,000 Units (has no administration in time range)  heparin  ADULT infusion 100 units/mL (25000 units/250mL) (has no administration in time range)  ondansetron  (ZOFRAN ) injection 4 mg (4 mg Intravenous Given 03/25/24 1640)    ED Course/ Medical Decision Making/ A&P Clinical Course as of 03/25/24 1848  Thu Mar 25, 2024  1736 Repeat troponin negative, HEART score 4  with concerning story and will discuss with cardiology for dispo recommendations. [VK]  1740 Patient reports chest pain is still present but is mild and is coming and going now. [VK]  1807 I spoke with Dr. Emmette Harms with cardiology who will evaluate the patient. Recommends admission for unstable angina, starting heparin  gtt. [VK]    Clinical Course User Index [VK] Kingsley, Izreal Kock K, DO                                 Medical Decision Making This patient presents to the ED with chief complaint(s) of chest pain with pertinent past medical history of CAD, hypertension, diabetes, hyperlipidemia which further complicates the presenting complaint. The complaint involves an extensive differential diagnosis and also carries with it a high risk of complications and morbidity.    The differential diagnosis includes ACS, arrhythmia, anemia, pneumonia, pneumothorax, pulmonary edema, pleural effusion, gastritis, GERD, anxiety  Additional history obtained: Additional history obtained from N/a Records reviewed outpatient cardiology records  ED Course and Reassessment: On patient's arrival she was hemodynamically stable in no acute distress.  EKG on arrival showed normal sinus rhythm without acute ischemic changes.  She had labs and chest x-ray image reviewed in triage.  Patient's initial troponin was negative, will need repeat with ongoing pain and pain presuming just prior to arrival, CMP hemolyzed and repeat is pending.  Patient's chest x-ray showed no acute disease.  She is still having some chest pain and nausea at this time and will be given Zofran  and nitro and will be closely reassessed.  Independent labs interpretation:  The following labs were independently interpreted: within normal range  Independent visualization of imaging: - I independently visualized the following imaging with scope of interpretation limited to determining acute life threatening conditions related to emergency care: CXR, which  revealed no acute disease  Consultation: - Consulted or discussed management/test interpretation w/ external professional:  cardiology, hospitalist   Consideration for admission or further workup: Patient requires admission for unstable angina  Social Determinants of health: N/A    Amount and/or Complexity of Data Reviewed Labs: ordered.  Risk Prescription drug management. Decision regarding hospitalization.          Final Clinical Impression(s) / ED Diagnoses Final diagnoses:  Unstable angina [facility])    Rx / DC Orders ED Discharge Orders     None         Kingsley, Gervis Gaba K, DO 03/25/24 1849

## 2024-03-25 NOTE — Hospital Course (Addendum)
 Vanessa Case is a 53 y.o. female with PMH of NSTEMI s/p LCX DES in 01/2020, T2DM, HTN, HLD, GAD, PTSD, and depression who presented with chest pain, admitted on 03/25/2024 for unstable angina.   #Unstable Angina #CAD s/p LCX DES in 01/2020 Patient presented with chest pain concerning for unstable angina given normal EKG, normal troponin, and chest pain similar to her NSTEMI in 01/2020, which required an LCX DES for 99% stenosed lesion. She underwent left heart catheterization, which showed patent left circumflex stent and no other obstructive coronary disease. Echocardiogram was also repeated with normal results.  Regarding medical management, she was started on rosuvastatin  40 mg daily and metoprolol  succinate 25 mg daily. Her home ezetimibe  10 mg daily and aspirin  81 mg daily were continued during hospitalization.  On the day of discharge she had some exertional chest pain that was responsive to nitroglycerin  without any other vital sign changes.  Metoprolol  was increased to 25 mg as above and she was discharged with as needed sublingual nitroglycerin  and close clinic follow-up on 03/31/2024.  Management of her co-morbid conditions were optimized as outlined below.   #Hypertension Patient was initially hypertensive to 173/95 upon presentation to the ED. Blood pressure improved with SBPs 106-120 on 6/6 after receiving Imdur  30 mg x1 @ 1858. Her home irbesartan  was held in anticipation of IV contrast during LHC.  We continue to hold her irbesartan  and amlodipine  in the setting of normal blood pressures while increasing metoprolol  for its antianginal effect.  She is instructed to bring these medications to her clinic appointment with recommendations to resume the irbesartan  if her blood pressure is elevated and then amlodipine  if needed after that.   #Type 2 Diabetes Admission HgbA1c 6.9% down from 10.1% in 10/2023. Patient was continued on home Jardiance  10 mg daily while hospitalized. Her home metformin   and Ozempic  were held but should be resumed at discharge.   #Hyperlipidemia Repeat lipid panel with LDL 105, HDL 39, triglycerides 171, and total cholesterol 171. Patient was started on rosuvastatin  40 mg daily in addition to home ezetimibe  10 mg daily. She should continue both of the medications after discharge.   #Normocytic Anemia Admission labs notable for hemoglobin 11.5 and MCV 86.2. Anemia workup was completed with normal ferritin (89), normal iron (63), normal TIBC (357), normal saturation (18%), and normal reticulocytes (1.2%). Etiology likely mixed iron deficiency anemia and anemia of chronic disease given normal MCV and iron studies. Total iron deficit 591 mg per [facility], so patient was started on ferrous sulfate  325 mg daily. Since echocardiogram showed normal left ventricular ejection fraction, patient did not receive IV iron even though TSAT < 20%.   #Chronic Back and Neck Pain #Post-herpetic Neuralgia #Diabetic Neuropathy #Right Rotator Cuff Tear Patient was continued on home gabapentin  600 mg TID while hospitalized. She should avoid NSAIDs when possible moving forward given her CAD.   #Anxiety #Depression #PTSD #Bipolar II disorder Patient was continued on her home fluoxetine  60 mg every morning, quetiapine  50 mg nightly, and trazodone  50 mg nightly while hospitalized.

## 2024-03-25 NOTE — H&P (Signed)
 Date: 03/25/2024         Patient Name:  Vanessa Case MRN: [id number]  DOB: 03/04/1971 Age / Sex: 53 y.o., female   PCP: Chrissie Coupe, MD         Medical Service: Internal Medicine Teaching Service         Attending Physician: Dr. Cherylene Corrente, MD    First Contact: Dr. Sharlon Deacon Pager: [phone]  Second Contact: Dr. Hayes Lipps Pager: [phone]                 Chief Concern: Chest pain  History of Present Illness: Vanessa Case is a 53 yo female with PMH MI s/p LCX stent in 2021, T2DM, HTN, HLD, GAD, PTSD, and depression who presents with new-onset chest pain and pressure. She was in her usual state of health until this morning around 10:15, when she reported sharp R arm pain when lifting heavy groceries. The pain radiated to her chest, localized to the right sternal border, and was rated at 9/10. She sat down and took 4 ASA and nitroglycerin , which she reports alleviated her pain somewhat. Standing and moving provoked her pain. Later in the day, she was in [employer] and experienced recurrent chest pain and pressure. She experienced nausea, vomiting, back sweats, lightheadedness, decreased appetite, and headache "as if [she] were high." These symptoms prompted her to call 911. She also endorsed a sore throat last Tue-Thur that resolved with ibuprofen . She denied syncope, fever, chills, flu-like symptoms, and sick contacts.   Allergies: Percocet: hives, itching, palpitations, nightmares Zolpidem : sleepwalking  Past Medical History: MI s/p PCI and LCX stent 2021 DM HTN HLD  Lumbar radiculopathy Rotator cuff tear, R Postherpetic neuralgia  GAD PTSD Depression Bipolar II Disorder Prior polysubstance use (cannabis, crack cocaine) Prior alcohol  use Prior tobacco use   Medications: No current facility-administered medications on file prior to encounter.   Current Outpatient Medications on File Prior to Encounter  Medication Sig Dispense Refill   amLODipine   (NORVASC ) 5 MG tablet Take 1 tablet (5 mg total) by mouth daily. 30 tablet 11   aspirin  EC 81 MG EC tablet Take 1 tablet (81 mg total) by mouth daily. 90 tablet 2   empagliflozin  (JARDIANCE ) 10 MG TABS tablet Take 1 tablet (10 mg total) by mouth daily before breakfast. 30 tablet 2   ezetimibe  (ZETIA ) 10 MG tablet Take 1 tablet (10 mg total) by mouth daily. 90 tablet 3   FLUoxetine  (PROZAC ) 20 MG capsule Take 3 capsules (60 mg total) by mouth daily. 90 capsule 1   fluticasone  (FLOVENT  HFA) 44 MCG/ACT inhaler Inhale 2 puffs into the lungs 2 (two) times daily as needed (for shortness of breath). 1 Inhaler 0   gabapentin  (NEURONTIN ) 300 MG capsule Take 2 capsules (600 mg total) by mouth 3 (three) times daily. TAKE 2 CAPSULE BY MOUTH IN THE MORNING, 2 CAPSULE IN THE EVENING, AND 2 CAPSULES AT NIGHT (Patient taking differently: Take 600 mg by mouth 3 (three) times daily.) 270 capsule 1   hydrOXYzine  (VISTARIL ) 25 MG capsule Take 25-50 mg by mouth 2 (two) times daily as needed for anxiety or itching.     irbesartan  (AVAPRO ) 300 MG tablet Take 1 tablet (300 mg total) by mouth daily. 90 tablet 3   metFORMIN  (GLUCOPHAGE ) 1000 MG tablet Take 1 tablet (1,000 mg total) by mouth 2 (two) times daily with a meal. 90 tablet 3   Multiple Vitamins-Calcium  (ONE-A-DAY WOMENS FORMULA PO) Take 1 tablet by mouth daily.  prazosin  (MINIPRESS ) 1 MG capsule Take 1 capsule (1 mg total) by mouth at bedtime. To prevent nightmares 30 capsule 1   QUEtiapine  (SEROQUEL ) 50 MG tablet Take 1 tablet (50 mg total) by mouth at bedtime. 30 tablet 0   Semaglutide , 1 MG/DOSE, 4 MG/3ML SOPN Inject 1 mg into the skin once a week. (Patient taking differently: Inject 1.5 mg into the skin once a week.) 3 mL 3   traZODone  (DESYREL ) 50 MG tablet Take 50 mg by mouth at bedtime.     amoxicillin  (AMOXIL ) 500 MG capsule Take 1 capsule (500 mg total) by mouth 3 (three) times daily. (Patient not taking: Reported on 03/25/2024) 21 capsule 0   hydrOXYzine   (ATARAX ) 25 MG tablet Take 1 tablet (25 mg total) by mouth daily. (Patient taking differently: Take 25 mg by mouth daily as needed.) 30 tablet 1   mirtazapine  (REMERON ) 15 MG tablet Take 1 tablet (15 mg total) by mouth at bedtime. (Patient not taking: Reported on 03/25/2024) 30 tablet 1   naltrexone  (DEPADE) 50 MG tablet Take 50 mg by mouth daily as needed (For urges and cravings).       Surgical History: Recent tooth extraction 11/2023. Reviewed surgical history and no pertinent updates.  Family History:  Family History  Problem Relation Age of Onset   Diabetes Mother    Atrial fibrillation Mother    Cystic fibrosis Father    Diabetes Maternal Grandmother    Heart disease Maternal Grandfather     Social History:  Home: Lives in [place] with her son. She feels safe at home. Work: She works at [employer] and refers to herself as the "[employer]." Activities: Enjoys gardening, reading books, and listening to [employer].  Substances: - Tobacco: former smoker with 14 pack-year history. Quit in 2021 after her LCX stent was placed. No cravings for nicotine  currently. - Drugs: former crack cocaine use (last use 05/2023), cannabis (last use 23d ago), in NA, on naltrexone . - Alcohol : former alcohol  use disorder (last in 1999), in AA Health: Health-conscious and concerned about managing her chronic conditions with diet, exercise, and medication adherence. Recognized her medications and their indications. PCP: Dr. Nooruddin. Also follows with cardiology and mental/behavioral health specialists at the [facility].  Physical Exam: Blood pressure (!) 166/94, pulse 90, temperature 97.9 F (36.6 C), temperature source Oral, resp. rate 12, height 5\' 5"  (1.651 m), weight 83.5 kg, last menstrual period 10/07/2011, SpO2 100%.  General: Well-appearing, well-nourished woman in NAD lying comfortably in bed. CV: RRR, no murmurs appreciated. Normal radial pulses bilaterally. No JVD  appreciated.  Chest: No pain to palpation over sternal border or epigastric region. Pulm: Normal WOB on RA. Lungs CTAB. Extremities: No peripheral edema appreciated. Neuro: Alert and responding appropriately. PERRL, EOMI.  Psych: Normal mood and pleasant affect.  EKG:  Sinus rhythm with no ST or T wave changes, unchanged from prior.  Labs: CBC: Hgb 11.5, Ht 35.0% CMP wnl. CBG 146 Trops 3 then 4  Images and other studies: CXR negative for active cardiopulmonary disease.  Assessment & Plan:  Shakayla Hickox is a 53 y.o. woman with PMH MI s/p LCX stent in 2021, T2DM, HTN, HLD, GAD, PTSD, and depression who presents with typical angina pain, negative cardiac enzymes, and normal EKG concerning for unstable angina.  NSTE-ACS Hx of NSTEMI Hx of Crack Cocaine Use (reported last use 05/2023) Patient presented with typical angina symptoms (acutely worsening chest pressure and pain with radiation to arm, N/V, diaphoresis) with some  alleviation with rest, ASA, and nitroglycerin  (from 9/10 to 5-6/10). She has history of MI s/p LCX stent placement in 2021, HTN, HLD, and T2DM. Trops 3 then 4 and EKG within normal limits without ST or T wave changes.  - Cardiology consulted; appreciate their recommendations.  - Left heart catheterization tomorrow AM  - Repeat echo ordered  - Recommend UDS for concern of history of crack cocaine use before initiating beta blocker - Nitroglycerine 0.4 mg sublingual q5m PRN - Heparin  infusion - ASA 81 mg daily - Lipitor  20 mg daily - Zetia  10 mg daily - NPO at midnight  - Zofran  4 mg PRN - Telemetry  Chronic Stable Conditions HTN - Hold irbesartan  iso upcoming catheterization and contrast administration - Starting imdur  30 mg daily - Continue amlodipine  5 mg daily - Beta blocker as above  T2DM Peripheral Neuropathy Recent Hgb A1C 10.1. Insulin -naive on ozempic , jardiance , metformin , and gabapentin  at home. - Repeat A1C - Jardiance  10 mg daily -  Hold metformin  and Ozempic  with plan to resume at discharge - SSI (short-acting with meals) - BMP  HLD - Lipitor  and Zetia  as above - Lipoprotein a per cardiology recommendation  Anemia Hemoglobin at 11.5 on admission and 11.9 in February. Denied blood in stool or menses (s/p hysterectomy) and reports never having had routine colonoscopy. - Iron panel, ferritin, and reticulocytes ordered - Recommended routine colonoscopy outpatient   Anxiety Depression PTSD Bipolar II - Fluoxetine  60 mg daily mornings - Quetiapine  50 mg daily at bedtime - Trazodone  50 mg daily at bedtime  Chronic Back and Neck Pain Post-herpetic Neuralgia Rotator Cuff Tear, Right - BP cuff preferred on left arm if possible - Gabapentin  600 mg TID - Recommended discontinuing non-ASA NSAIDs with concern for ACS and hx of ACS  Level of care: Cardiac telemetry Diet: Carb-modified, DM IVF: None VTE: Therapeutic heparin  infusion for NSTE-ACS Code: Full Surrogate: Tayvon Greenidge (son), [phone]  Signed: Steffani Edman 03/25/2024, 8:57 PM  Attestation for Student Documentation:  I personally was present and performed or re-performed the history, physical exam and medical decision-making activities of this service and have verified that the service and findings are accurately documented in the student's note.  53 year old with history of NSTEMI with PCI in 2021 presents to the emergency department with chest pain concerning for non-ST elevation ACS.  Principal Problem:   Unstable angina (HCC) Currently stable.  Still has some mild 5-6 out of 10 chest pain but this is an improvement from before.  With her history of NSTEMI and PCI to a 99% stenosed lesion in the left circumflex, her story paints a concerning picture for NSTE-ACS.  I agree with admission for ischemic evaluation by way of left heart cath.  Appreciate cardiology's insight.  She took several baby aspirin  prior to arrival at the hospital.  Going  to start a daily aspirin  tonight along with atorvastatin  and her outpatient ezetimibe .  Starting isosorbide  mononitrate 30 mg daily.  She would benefit from a beta-blocker, I think this would be safe at this point but will await urine tox to see if she is had any recent cocaine use.  Check echocardiogram.  Will check hemoglobin A1c, lipid profile.  Check lipoprotein a if possible.  Restarting outpatient amlodipine  5 mg daily.  She wonders if this could be reflux, I think GERD is on the differential, will try dose of pantoprazole  tonight to see if that helps her.  Active Problems:   Type 2 diabetes mellitus with diabetic polyneuropathy (HCC) Poorly  controlled with neuropathy.  Last A1c was around 10.  Not hyperglycemic on admission.  Will start moderate sliding scale insulin .  Restarting outpatient empagliflozin  10 mg daily.    Essential hypertension Blood pressures around 150 systolic.  Going to restart outpatient amlodipine .  Holding her ARB pending her left heart cath and significant arterial iodinated contrast load.    Major depressive disorder, recurrent, severe without psychotic features [facility]) She is anxious now that she is in the hospital but doing okay.  Will restart her outpatient quetiapine  50 mg nightly, trazodone  50 mg nightly as needed for sleep, daily fluoxetine  60 mg.    Anemia Mild and normocytic.  No longer has menstrual cycles.  Has not had any bleeding.  Never had screening colonoscopy.  Will check ferritin and reticulocytes.  Adria Hopkins MD 03/25/2024, 10:12 PM

## 2024-03-25 NOTE — Consult Note (Signed)
 Cardiology Consultation   Patient ID: Vanessa Case MRN: [id number]; DOB: 1971/10/21  Admit date: 03/25/2024 Date of Consult: 03/25/2024  PCP:  Nooruddin, Saad, MD    [facility] Providers Cardiologist:  Sheryle Donning, MD        Patient Profile: Vanessa Case is a 53 y.o. female with a hx of coronary disease (NSTEMI 02/05/2020, DES-left circumflex), hypertension, hyperlipidemia, type 2 diabetes mellitus complicated by polyneuropathy, history of PTSD/anxiety/depression  who is being seen 03/25/2024 for the evaluation of  at the request of Dr. Nora Beal.  History of Present Illness: Vanessa Case presents today to be evaluated for chest discomfort.  She reports that she was doing well recently.  She quit smoking marijuana as well as for several months has stopped using any recreational drug including cocaine.  However today she reports that while lifting groceries for her mother she suddenly started to experience mid chest discomfort and then had some radiation to the arm and to the neck.  She did not pay any mind at which time she quantified this pain as about a 9 out of 10.  When she stopped lifting the groceries she tells me that it stopped as well.  After that she got on the bus and had to go to [employer] while off the bus and going to [employer] again she felt the chest pressure.  At this time when she got to [employer] she felt nauseous and ended up vomiting.  She noted right after she vomited she had significant pain which she quantify again at a 9 out of 10.  She has some chewable aspirin  in her bag which she took and tells me that the pain came down a few minutes after taking the aspirin  to about 8 out of 10.  She tells me that it started to feel familiar to 2021 when she had a stent placement she got significantly nervous and decided to come to the ED.  She understands and knows the pain from her GERD but tells me this is completely different.  Here in the ED  she got some nitroglycerin  which brought her pain down to a 6 out of 10.  Currently during my visit she tells me that the pain is a 6 out of 10 and is nonreproducible.   Associated symptoms include nausea, vomiting, and lightheadedness. The symptoms began suddenly, despite feeling well earlier in the day.   Past Medical History:  Diagnosis Date   Anemia    Anxiety    Arthritis    Cholelithiasis with acute cholecystitis 06/28/2013   Chronic abdominal pain    Chronic back pain    Chronic headache    Chronic neck pain    Coronary artery disease    Depression    Diabetes mellitus    adult onset dm-maintained on glipizide  and metformin , pt states fasting glucose runs 110   Fibromyalgia    GERD (gastroesophageal reflux disease)    HLD (hyperlipidemia) 05/30/2015   Hyperlipidemia    Hypertension    maintained on lisinopril  hct, metoprolol -134/86 at pat visit   Neuromuscular disorder (HCC)    Neuropathy    NSTEMI (non-ST elevated myocardial infarction) (HCC) 02/05/2020   Obesity    PONV (postoperative nausea and vomiting)    PTSD (post-traumatic stress disorder)    Right arm weakness 03/05/2013   Substance abuse (HCC)     Past Surgical History:  Procedure Laterality Date   ABDOMINAL HYSTERECTOMY  10/30/2011   Procedure: HYSTERECTOMY ABDOMINAL;  Surgeon:  Romilda Coaster, MD;  Location: [facility];  Service: Gynecology;  Laterality: N/A;   CESAREAN SECTION  x3   CHOLECYSTECTOMY N/A 06/28/2013   Procedure: LAPAROSCOPIC CHOLECYSTECTOMY;  Surgeon: Rogena Class, MD;  Location: [facility];  Service: General;  Laterality: N/A;   CORONARY STENT INTERVENTION N/A 02/07/2020   Procedure: CORONARY STENT INTERVENTION;  Surgeon: Arleen Lacer, MD;  Location: [facility] INVASIVE CV LAB;  Service: Cardiovascular;  Laterality: N/A;   LEFT HEART CATH AND CORONARY ANGIOGRAPHY N/A 02/07/2020   Procedure: LEFT HEART CATH AND CORONARY ANGIOGRAPHY;  Surgeon: Arleen Lacer, MD;  Location: [facility] INVASIVE CV LAB;   Service: Cardiovascular;  Laterality: N/A;   SALPINGOOPHORECTOMY  10/30/2011   Procedure: SALPINGO OOPHERECTOMY;  Surgeon: Romilda Coaster, MD;  Location: [facility];  Service: Gynecology;  Laterality: Right;   TOOTH EXTRACTION N/A 12/11/2023   Procedure: DENTAL EXTRACTIONS # TWO, SIX, SEVEN, EIGHT, NINE, ELEVEN, THIRTEEN, FIFTEEN, NINETEEN & ALVEOLOPLASTY;  Surgeon: Ascencion Lava, DMD;  Location: [facility] OR;  Service: Oral Surgery;  Laterality: N/A;   WRIST SURGERY Left    carpel tunnel and tendonitis     Home Medications:  Prior to Admission medications   Medication Sig Start Date End Date Taking? Authorizing Provider  amLODipine  (NORVASC ) 5 MG tablet Take 1 tablet (5 mg total) by mouth daily. 11/17/23 11/16/24  Aurora Lees, DO  amoxicillin  (AMOXIL ) 500 MG capsule Take 1 capsule (500 mg total) by mouth 3 (three) times daily. 12/12/23   Ascencion Lava, DMD  aspirin  EC 81 MG EC tablet Take 1 tablet (81 mg total) by mouth daily. 02/09/20   Sanjuanita Cruz, NP  empagliflozin  (JARDIANCE ) 10 MG TABS tablet Take 1 tablet (10 mg total) by mouth daily before breakfast. 11/17/23   Aurora Lees, DO  ezetimibe  (ZETIA ) 10 MG tablet Take 1 tablet (10 mg total) by mouth daily. 06/16/23   Croitoru, Mihai, MD  FLUoxetine  (PROZAC ) 20 MG capsule Take 3 capsules (60 mg total) by mouth daily. 08/13/23   Joice Nares, MD  fluticasone  (FLOVENT  HFA) 44 MCG/ACT inhaler Inhale 2 puffs into the lungs 2 (two) times daily as needed (for shortness of breath). 03/14/16   Mozell Arias, MD  gabapentin  (NEURONTIN ) 300 MG capsule Take 2 capsules (600 mg total) by mouth 3 (three) times daily. TAKE 2 CAPSULE BY MOUTH IN THE MORNING, 2 CAPSULE IN THE EVENING, AND 2 CAPSULES AT NIGHT 01/14/24 04/13/24  Nooruddin, Saad, MD  hydrOXYzine  (ATARAX ) 25 MG tablet Take 1 tablet (25 mg total) by mouth daily. Patient taking differently: Take 25 mg by mouth daily as needed. 08/13/23   Joice Nares, MD  ibuprofen  (ADVIL ) 200 MG tablet Take  1,000 mg by mouth every 8 (eight) hours as needed for moderate pain (pain score 4-6).    [provider]  irbesartan  (AVAPRO ) 300 MG tablet Take 1 tablet (300 mg total) by mouth daily. 08/15/23 08/14/24  Nooruddin, Saad, MD  metFORMIN  (GLUCOPHAGE ) 1000 MG tablet Take 1 tablet (1,000 mg total) by mouth 2 (two) times daily with a meal. 09/22/23   Nooruddin, Dann Dust, MD  mirtazapine  (REMERON ) 15 MG tablet Take 1 tablet (15 mg total) by mouth at bedtime. 08/13/23   Joice Nares, MD  Multiple Vitamins-Calcium  (ONE-A-DAY WOMENS FORMULA PO) Take 1 tablet by mouth daily.    [provider]  naltrexone  (DEPADE) 50 MG tablet Take 50 mg by mouth daily as needed (For urges and cravings).    [provider]  prazosin  (MINIPRESS )  1 MG capsule Take 1 capsule (1 mg total) by mouth at bedtime. To prevent nightmares 08/13/23   Joice Nares, MD  QUEtiapine  (SEROQUEL ) 50 MG tablet Take 1 tablet (50 mg total) by mouth at bedtime. 08/27/23   Kober, Charles E, PA-C  Semaglutide , 1 MG/DOSE, 4 MG/3ML SOPN Inject 1 mg into the skin once a week. Patient taking differently: Inject 1.5 mg into the skin once a week. 09/01/23   Nooruddin, Saad, MD  traZODone  (DESYREL ) 50 MG tablet Take 50 mg by mouth at bedtime as needed for sleep.    [provider]    Scheduled Meds:  Continuous Infusions:  PRN Meds: nitroGLYCERIN   Allergies:    Allergies  Allergen Reactions   Percocet [Oxycodone -Acetaminophen ] Hives, Itching, Palpitations and Other (See Comments)    nightmares   Ambien  [Zolpidem ] Other (See Comments)    Sleepwalking episodes    Social History:   Social History   Socioeconomic History   Marital status: Divorced    Spouse name: Not on file   Number of children: 1   Years of education: Not on file   Highest education level: Not on file  Occupational History   Not on file  Tobacco Use   Smoking status: Former    Current packs/day: 0.00    Types: Cigarettes    Quit  date: 01/2020    Years since quitting: 4.1   Smokeless tobacco: Never   Tobacco comments:    has smoked for 14 years total - quit for 6 years at one point but has been smoking the last 3 years  Vaping Use   Vaping status: Never Used  Substance and Sexual Activity   Alcohol  use: Not Currently    Alcohol /week: 0.0 standard drinks of alcohol    Drug use: Yes    Types: Cocaine, Marijuana, "Crack" cocaine    Comment: last used cocaine 6/19 at 1800 and marijuana 6/20 0700   Sexual activity: Not on file  Other Topics Concern   Not on file  Social History Narrative   Not on file   Social Drivers of Health   Financial Resource Strain: High Risk (11/17/2023)   Overall Financial Resource Strain (CARDIA)    Difficulty of Paying Living Expenses: Very hard  Food Insecurity: No Food Insecurity (12/11/2023)   Hunger Vital Sign    Worried About Running Out of Food in the Last Year: Never true    Ran Out of Food in the Last Year: Never true  Transportation Needs: No Transportation Needs (12/11/2023)   PRAPARE - Administrator, Civil Service (Medical): No    Lack of Transportation (Non-Medical): No  Physical Activity: Sufficiently Active (11/17/2023)   Exercise Vital Sign    Days of Exercise per Week: 5 days    Minutes of Exercise per Session: 50 min  Stress: Stress Concern Present (11/17/2023)   [employer] of Occupational Health - Occupational Stress Questionnaire    Feeling of Stress : Very much  Social Connections: Socially Isolated (11/17/2023)   Social Connection and Isolation Panel [NHANES]    Frequency of Communication with Friends and Family: Never    Frequency of Social Gatherings with Friends and Family: Never    Attends Religious Services: Never    Database administrator or Organizations: No    Attends Banker Meetings: Never    Marital Status: Separated  Intimate Partner Violence: Not At Risk (12/11/2023)   Humiliation, Afraid, Rape, and Kick  questionnaire  Fear of Current or Ex-Partner: No    Emotionally Abused: No    Physically Abused: No    Sexually Abused: No    Family History:    Family History  Problem Relation Age of Onset   Diabetes Mother    Atrial fibrillation Mother    Cystic fibrosis Father    Diabetes Maternal Grandmother    Heart disease Maternal Grandfather      ROS:  Please see the history of present illness.  Chest pressure, vomiting All other ROS reviewed and negative.     Physical Exam/Data: Vitals:   03/25/24 1630 03/25/24 1700 03/25/24 1707 03/25/24 1745  BP: (!) 165/95 (!) 173/95    Pulse: 67 90    Resp: 17 12    Temp:    97.9 F (36.6 C)  TempSrc:    Oral  SpO2: 99% 100%    Weight:   83.5 kg   Height:   5\' 5"  (1.651 m)    No intake or output data in the 24 hours ending 03/25/24 1820    03/25/2024    5:07 PM 12/11/2023    8:16 AM 11/26/2023    9:00 AM  Last 3 Weights  Weight (lbs) 184 lb 1.4 oz 184 lb 179 lb 14.3 oz  Weight (kg) 83.5 kg 83.462 kg 81.6 kg     Body mass index is 30.63 kg/m.  General:  Well nourished, well developed, in no acute distress HEENT: normal Neck: no JVD Vascular: No carotid bruits; Distal pulses 2+ bilaterally Cardiac:  normal S1, S2; RRR; no murmur  Lungs:  clear to auscultation bilaterally, no wheezing, rhonchi or rales  Abd: soft, nontender, no hepatomegaly  Ext: no edema Musculoskeletal:  No deformities, BUE and BLE strength normal and equal Skin: warm and dry  Neuro:  CNs 2-12 intact, no focal abnormalities noted Psych:  Normal affect   EKG:  The EKG was personally reviewed and demonstrates: Sinus rhythm, heart rate 88 bpm with no ST segment changes Telemetry:  Telemetry was personally reviewed and demonstrates: Sinus rhythm  Relevant CV Studies: Echocardiogram and left heart catheterization both in 2021  Laboratory Data: High Sensitivity Troponin:   Recent Labs  Lab 03/25/24 1400 03/25/24 1648  TROPONINIHS 3 4     Chemistry Recent  Labs  Lab 03/25/24 1647  NA 138  K 3.9  CL 109  CO2 19*  GLUCOSE 97  BUN 8  CREATININE 0.96  CALCIUM  9.4  GFRNONAA >60  ANIONGAP 10    Recent Labs  Lab 03/25/24 1647  PROT 6.8  ALBUMIN 3.6  AST 17  ALT 14  ALKPHOS 46  BILITOT 0.8   Lipids No results for input(s): "CHOL", "TRIG", "HDL", "LABVLDL", "LDLCALC", "CHOLHDL" in the last 168 hours.  Hematology Recent Labs  Lab 03/25/24 1400  WBC 6.4  RBC 4.06  HGB 11.5*  HCT 35.0*  MCV 86.2  MCH 28.3  MCHC 32.9  RDW 14.5  PLT 272   Thyroid No results for input(s): "TSH", "FREET4" in the last 168 hours.  BNPNo results for input(s): "BNP", "PROBNP" in the last 168 hours.  DDimer No results for input(s): "DDIMER" in the last 168 hours.  Radiology/Studies:  DG Chest 2 View Result Date: 03/25/2024 CLINICAL DATA:  chest pain EXAM: CHEST - 2 VIEW COMPARISON:  November 26, 2023 FINDINGS: No focal airspace consolidation, pleural effusion, or pneumothorax. No cardiomegaly. No acute fracture or destructive lesion. Multilevel thoracic osteophytosis. Cholecystectomy clips. IMPRESSION: No acute cardiopulmonary abnormality. Electronically Signed  By: Rance Burrows M.D.   On: 03/25/2024 15:16     Assessment and Plan: Coronary artery disease with chest discomfort suspicious for unstable angina Type 2 diabetes mellitus Hypertensive heart disease/accelerated hypertension in the ED Hyperlipidemia  Her chest comfort is concerning for unstable angina.  Given this patient who have a high burden of progression for CAD.  At this time we need to pursue treatment we will start the patient on heparin  drip, continue her aspirin  81 mg daily, she is on Zetia  10 mg daily, does not appear that she is on a statin, she has been on atorvastatin  20 mg in the past we will restart this.  Is not on a beta-blocker although she has been on that in the past but has had transient cocaine use I suspect that is the reason why.  I would recommend getting talk screen  to make sure she is negative before starting a beta-blocker as she tells me that she has stopped using recreational drugs.  Please get hemoglobin A1c, lipid profile as well as lipoprotein a. Her last hemoglobin A1c which was done in January 2025 showing that her hemoglobin A1c was 10.1.  She needs a repeat echocardiogram her last echo was in April 2021.  We will order this.  I spoke with the patient about pursuing with ischemic evaluation which at this time the best test would be a left heart catheterization and she is agreeable for this.  All of her questions has been answered.  Informed Consent   Shared Decision Making/Informed Consent The risks [stroke (1 in 1000), death (1 in 1000), kidney failure [usually temporary] (1 in 500), bleeding (1 in 200), allergic reaction [possibly serious] (1 in 200)], benefits (diagnostic support and management of coronary artery disease) and alternatives of a cardiac catheterization were discussed in detail with Vanessa Case and she is willing to proceed.      Risk Assessment/Risk Scores:    TIMI Risk Score for Unstable Angina or Non-ST Elevation MI:   The patient's TIMI risk score is  , which indicates a  % risk of all cause mortality, new or recurrent myocardial infarction or need for urgent revascularization in the next 14 days.      For questions or updates, please contact [facility] Please consult [email or web address] for contact info under    Signed, Steve Youngberg, DO  03/25/2024 6:20 PM

## 2024-03-25 NOTE — ED Notes (Signed)
 Report given, patient transferred to room 40

## 2024-03-25 NOTE — Progress Notes (Signed)
 PHARMACY - ANTICOAGULATION CONSULT NOTE  Pharmacy Consult for heparin   Indication: chest pain/ACS  Allergies  Allergen Reactions   Percocet [Oxycodone -Acetaminophen ] Hives, Itching, Palpitations and Other (See Comments)    nightmares   Ambien  [Zolpidem ] Other (See Comments)    Sleepwalking episodes    Patient Measurements: Height: 5\' 5"  (165.1 cm) Weight: 83.5 kg (184 lb 1.4 oz) IBW/kg (Calculated) : 57 HEPARIN  DW (KG): 74.9  Vital Signs: Temp: 97.9 F (36.6 C) (06/05 1745) Temp Source: Oral (06/05 1745) BP: 173/95 (06/05 1700) Pulse Rate: 90 (06/05 1700)  Labs: Recent Labs    03/25/24 1400 03/25/24 1647 03/25/24 1648  HGB 11.5*  --   --   HCT 35.0*  --   --   PLT 272  --   --   CREATININE  --  0.96  --   TROPONINIHS 3  --  4    Estimated Creatinine Clearance: 74.8 mL/min (by C-G formula based on SCr of 0.96 mg/dL).   Medical History: Past Medical History:  Diagnosis Date   Anemia    Anxiety    Arthritis    Cholelithiasis with acute cholecystitis 06/28/2013   Chronic abdominal pain    Chronic back pain    Chronic headache    Chronic neck pain    Coronary artery disease    Depression    Diabetes mellitus    adult onset dm-maintained on glipizide  and metformin , pt states fasting glucose runs 110   Fibromyalgia    GERD (gastroesophageal reflux disease)    HLD (hyperlipidemia) 05/30/2015   Hyperlipidemia    Hypertension    maintained on lisinopril  hct, metoprolol -134/86 at pat visit   Neuromuscular disorder (HCC)    Neuropathy    NSTEMI (non-ST elevated myocardial infarction) (HCC) 02/05/2020   Obesity    PONV (postoperative nausea and vomiting)    PTSD (post-traumatic stress disorder)    Right arm weakness 03/05/2013   Substance abuse (HCC)     Assessment: 52 YOF presenting with chest pain. Pertinent PMH of coronary disease (NSTEMI 02/05/2020, DES-left circumflex. Pharmacy consulted for heparin . No PTA anticoagulation.   Goal of Therapy:   Heparin  level 0.3-0.7 units/ml Monitor platelets by anticoagulation protocol: Yes   Plan:  Give 4000 units bolus x 1 Start heparin  infusion at 900 units/hr Check anti-Xa level in 6 hours and daily while on heparin  Continue to monitor H&H and platelets F/u plans for cath   Estelle Skibicki 03/25/2024,6:41 PM

## 2024-03-26 ENCOUNTER — Encounter (HOSPITAL_COMMUNITY)
Admission: EM | Disposition: A | Discharge status: Home / Self Care | Payer: Self-pay | Source: Home / Self Care | Attending: Internal Medicine

## 2024-03-26 ENCOUNTER — Other Ambulatory Visit (HOSPITAL_COMMUNITY): Payer: MEDICAID

## 2024-03-26 DIAGNOSIS — I25110 Atherosclerotic heart disease of native coronary artery with unstable angina pectoris: Principal | ICD-10-CM

## 2024-03-26 DIAGNOSIS — E785 Hyperlipidemia, unspecified: Secondary | ICD-10-CM

## 2024-03-26 DIAGNOSIS — I10 Essential (primary) hypertension: Secondary | ICD-10-CM

## 2024-03-26 DIAGNOSIS — I200 Unstable angina: Secondary | ICD-10-CM | POA: Diagnosis not present

## 2024-03-26 HISTORY — PX: LEFT HEART CATH AND CORONARY ANGIOGRAPHY: CATH118249

## 2024-03-26 LAB — BASIC METABOLIC PANEL WITH GFR
Anion gap: 11 (ref 5–15)
Anion gap: 11 (ref 5–15)
BUN: 7 mg/dL (ref 6–20)
BUN: 5 mg/dL — ABNORMAL LOW (ref 6–20)
CO2: 18 mmol/L — ABNORMAL LOW (ref 22–32)
CO2: 20 mmol/L — ABNORMAL LOW (ref 22–32)
Calcium: 9.2 mg/dL (ref 8.9–10.3)
Calcium: 9.3 mg/dL (ref 8.9–10.3)
Chloride: 111 mmol/L (ref 98–111)
Chloride: 106 mmol/L (ref 98–111)
Creatinine, Ser: 0.72 mg/dL (ref 0.44–1.00)
Creatinine, Ser: 0.9 mg/dL (ref 0.44–1.00)
GFR, Estimated: >60 mL/min (ref >60)
GFR, Estimated: >60 mL/min (ref >60)
Glucose, Bld: 157 mg/dL — ABNORMAL HIGH (ref 70–99)
Glucose, Bld: 121 mg/dL — ABNORMAL HIGH (ref 70–99)
Potassium: 3.1 mmol/L — ABNORMAL LOW (ref 3.5–5.1)
Potassium: 3.6 mmol/L (ref 3.5–5.1)
Sodium: 140 mmol/L (ref 135–145)
Sodium: 137 mmol/L (ref 135–145)

## 2024-03-26 LAB — LIPID PANEL
Cholesterol: 178 mg/dL (ref 0–200)
HDL: 39 mg/dL — ABNORMAL LOW (ref >40)
LDL Cholesterol: 105 mg/dL — ABNORMAL HIGH (ref 0–99)
Total CHOL/HDL Ratio: 4.6 ratio
Triglycerides: 171 mg/dL — ABNORMAL HIGH (ref <150)
VLDL: 34 mg/dL (ref 0–40)

## 2024-03-26 LAB — RETICULOCYTES
Immature Retic Fract: 8.5 % (ref 2.3–15.9)
RBC.: 3.91 MIL/uL (ref 3.87–5.11)
Retic Count, Absolute: 46.5 10*3/uL (ref 19.0–186.0)
Retic Ct Pct: 1.2 % (ref 0.4–3.1)

## 2024-03-26 LAB — CBG MONITORING, ED
Glucose-Capillary: 160 mg/dL — ABNORMAL HIGH (ref 70–99)
Glucose-Capillary: 117 mg/dL — ABNORMAL HIGH (ref 70–99)
Glucose-Capillary: 95 mg/dL (ref 70–99)

## 2024-03-26 LAB — IRON AND TIBC
Iron: 63 ug/dL (ref 28–170)
Saturation Ratios: 18 % (ref 10.4–31.8)
TIBC: 357 ug/dL (ref 250–450)
UIBC: 294 ug/dL

## 2024-03-26 LAB — CBC
HCT: 33.7 % — ABNORMAL LOW (ref 36.0–46.0)
Hemoglobin: 11 g/dL — ABNORMAL LOW (ref 12.0–15.0)
MCH: 28.3 pg (ref 26.0–34.0)
MCHC: 32.6 g/dL (ref 30.0–36.0)
MCV: 86.6 fL (ref 80.0–100.0)
Platelets: 250 10*3/uL (ref 150–400)
RBC: 3.89 MIL/uL (ref 3.87–5.11)
RDW: 14 % (ref 11.5–15.5)
WBC: 7.4 10*3/uL (ref 4.0–10.5)
nRBC: 0 % (ref 0.0–0.2)

## 2024-03-26 LAB — GLUCOSE, CAPILLARY
Glucose-Capillary: 116 mg/dL — ABNORMAL HIGH (ref 70–99)
Glucose-Capillary: 115 mg/dL — ABNORMAL HIGH (ref 70–99)

## 2024-03-26 LAB — HEPARIN LEVEL (UNFRACTIONATED)
Heparin Unfractionated: 0.18 [IU]/mL — ABNORMAL LOW (ref 0.30–0.70)
Heparin Unfractionated: 0.26 [IU]/mL — ABNORMAL LOW (ref 0.30–0.70)

## 2024-03-26 LAB — MAGNESIUM: Magnesium: 1.5 mg/dL — ABNORMAL LOW (ref 1.7–2.4)

## 2024-03-26 LAB — FERRITIN: Ferritin: 89 ng/mL (ref 11–307)

## 2024-03-26 SURGERY — LEFT HEART CATH AND CORONARY ANGIOGRAPHY
Anesthesia: LOCAL

## 2024-03-26 MED ORDER — POTASSIUM CHLORIDE CRYS ER 20 MEQ PO TBCR
40.0000 meq | EXTENDED_RELEASE_TABLET | ORAL | Status: AC
  Administered 2024-03-26: 40 meq via ORAL
  Filled 2024-03-26 (×2): qty 2

## 2024-03-26 MED ORDER — VERAPAMIL HCL 2.5 MG/ML IV SOLN
INTRAVENOUS | Status: DC | PRN
  Administered 2024-03-26: 10 mL via INTRA_ARTERIAL

## 2024-03-26 MED ORDER — SODIUM CHLORIDE 0.9% FLUSH
3.0000 mL | INTRAVENOUS | Status: DC | PRN
Start: 2024-03-26 — End: 2024-03-27

## 2024-03-26 MED ORDER — LIDOCAINE HCL (PF) 1 % IJ SOLN
INTRAMUSCULAR | Status: AC
  Filled 2024-03-26: qty 30

## 2024-03-26 MED ORDER — MIDAZOLAM HCL 2 MG/2ML IJ SOLN
INTRAMUSCULAR | Status: AC
  Filled 2024-03-26: qty 2

## 2024-03-26 MED ORDER — HEPARIN SODIUM (PORCINE) 1000 UNIT/ML IJ SOLN
INTRAMUSCULAR | Status: DC | PRN
Start: 2024-03-26 — End: 2024-03-26
  Administered 2024-03-26: 4500 [IU] via INTRAVENOUS

## 2024-03-26 MED ORDER — ROSUVASTATIN CALCIUM 20 MG PO TABS
40.0000 mg | ORAL_TABLET | Freq: Every day | ORAL | Status: DC
  Administered 2024-03-26: 40 mg via ORAL
  Filled 2024-03-26: qty 2

## 2024-03-26 MED ORDER — LACTATED RINGERS IV SOLN: INTRAVENOUS | Status: AC

## 2024-03-26 MED ORDER — LIDOCAINE HCL (PF) 1 % IJ SOLN
INTRAMUSCULAR | Status: DC | PRN
  Administered 2024-03-26: 5 mL

## 2024-03-26 MED ORDER — FENTANYL CITRATE (PF) 100 MCG/2ML IJ SOLN
INTRAMUSCULAR | Status: DC | PRN
  Administered 2024-03-26: 25 ug via INTRAVENOUS

## 2024-03-26 MED ORDER — HEPARIN (PORCINE) IN NACL 1000-0.9 UT/500ML-% IV SOLN
INTRAVENOUS | Status: DC | PRN
  Administered 2024-03-26 (×2): 500 mL

## 2024-03-26 MED ORDER — HEPARIN BOLUS VIA INFUSION
2000.0000 [IU] | Freq: Once | INTRAVENOUS | Status: AC
  Administered 2024-03-26: 2000 [IU] via INTRAVENOUS
  Filled 2024-03-26: qty 2000

## 2024-03-26 MED ORDER — ASPIRIN 81 MG PO CHEW: 81.0000 mg | CHEWABLE_TABLET | ORAL | Status: DC

## 2024-03-26 MED ORDER — HEPARIN SODIUM (PORCINE) 1000 UNIT/ML IJ SOLN
INTRAMUSCULAR | Status: AC
  Filled 2024-03-26: qty 10

## 2024-03-26 MED ORDER — SODIUM CHLORIDE 0.9% FLUSH
3.0000 mL | Freq: Two times a day (BID) | INTRAVENOUS | Status: DC
  Administered 2024-03-26 – 2024-03-27 (×2): 3 mL via INTRAVENOUS

## 2024-03-26 MED ORDER — POTASSIUM CHLORIDE CRYS ER 20 MEQ PO TBCR
40.0000 meq | EXTENDED_RELEASE_TABLET | Freq: Once | ORAL | Status: AC
  Administered 2024-03-26: 40 meq via ORAL
  Filled 2024-03-26: qty 2

## 2024-03-26 MED ORDER — FERROUS SULFATE 325 (65 FE) MG PO TABS
325.0000 mg | ORAL_TABLET | Freq: Every day | ORAL | Status: DC
  Administered 2024-03-27: 325 mg via ORAL
  Filled 2024-03-26: qty 1

## 2024-03-26 MED ORDER — ROSUVASTATIN CALCIUM 20 MG PO TABS: 20.0000 mg | ORAL_TABLET | Freq: Every day | ORAL | Status: DC

## 2024-03-26 MED ORDER — SODIUM CHLORIDE 0.9 % WEIGHT BASED INFUSION
3.0000 mL/kg/h | INTRAVENOUS | Status: DC
  Administered 2024-03-26: 3 mL/kg/h via INTRAVENOUS

## 2024-03-26 MED ORDER — MIDAZOLAM HCL 2 MG/2ML IJ SOLN
INTRAMUSCULAR | Status: DC | PRN
Start: 2024-03-26 — End: 2024-03-26
  Administered 2024-03-26: 2 mg via INTRAVENOUS

## 2024-03-26 MED ORDER — METOPROLOL SUCCINATE ER 25 MG PO TB24
12.5000 mg | ORAL_TABLET | Freq: Every day | ORAL | Status: DC
  Administered 2024-03-26 – 2024-03-27 (×2): 12.5 mg via ORAL
  Filled 2024-03-26 (×2): qty 1

## 2024-03-26 MED ORDER — SODIUM CHLORIDE 0.9 % IV SOLN: 250.0000 mL | INTRAVENOUS | Status: DC | PRN

## 2024-03-26 MED ORDER — ENOXAPARIN SODIUM 40 MG/0.4ML IJ SOSY
40.0000 mg | PREFILLED_SYRINGE | INTRAMUSCULAR | Status: DC
  Filled 2024-03-26: qty 0.4

## 2024-03-26 MED ORDER — GABAPENTIN 300 MG PO CAPS
600.0000 mg | ORAL_CAPSULE | Freq: Three times a day (TID) | ORAL | Status: DC
  Administered 2024-03-26 – 2024-03-27 (×4): 600 mg via ORAL
  Filled 2024-03-26 (×6): qty 2

## 2024-03-26 MED ORDER — MAGNESIUM SULFATE 2 GM/50ML IV SOLN
2.0000 g | Freq: Once | INTRAVENOUS | Status: AC
  Administered 2024-03-26: 2 g via INTRAVENOUS
  Filled 2024-03-26: qty 50

## 2024-03-26 MED ORDER — SODIUM CHLORIDE 0.9 % WEIGHT BASED INFUSION: 1.0000 mL/kg/h | INTRAVENOUS | Status: DC

## 2024-03-26 MED ORDER — VERAPAMIL HCL 2.5 MG/ML IV SOLN
INTRAVENOUS | Status: AC
  Filled 2024-03-26: qty 2

## 2024-03-26 MED ORDER — SODIUM CHLORIDE 0.9 % IV SOLN
INTRAVENOUS | Status: AC | PRN
  Administered 2024-03-26: 10 mL/h via INTRAVENOUS

## 2024-03-26 MED ORDER — HEPARIN BOLUS VIA INFUSION
1250.0000 [IU] | Freq: Once | INTRAVENOUS | Status: AC
  Administered 2024-03-26: 1250 [IU] via INTRAVENOUS
  Filled 2024-03-26: qty 1250

## 2024-03-26 MED ORDER — IOHEXOL 350 MG/ML SOLN
INTRAVENOUS | Status: DC | PRN
  Administered 2024-03-26: 50 mL

## 2024-03-26 MED ORDER — FENTANYL CITRATE (PF) 100 MCG/2ML IJ SOLN
INTRAMUSCULAR | Status: AC
  Filled 2024-03-26: qty 2

## 2024-03-26 MED ORDER — DEXTROSE 50 % IV SOLN
1.0000 | Freq: Once | INTRAVENOUS | Status: AC
  Administered 2024-03-26: 50 mL via INTRAVENOUS
  Filled 2024-03-26: qty 50

## 2024-03-26 SURGICAL SUPPLY — 9 items
CATH 5FR JL3.5 JR4 ANG PIG MP (CATHETERS) IMPLANT
CATH LAUNCHER 6FR JL3 (CATHETERS) IMPLANT
DEVICE RAD COMP TR BAND LRG (VASCULAR PRODUCTS) IMPLANT
GLIDESHEATH SLEND SS 6F .021 (SHEATH) IMPLANT
GUIDEWIRE INQWIRE 1.5J.035X260 (WIRE) IMPLANT
KIT HEART LEFT (KITS) IMPLANT
PACK CARDIAC CATHETERIZATION (CUSTOM PROCEDURE TRAY) ×2 IMPLANT
SHEATH PROBE COVER 6X72 (BAG) IMPLANT
TRANSDUCER W/STOPCOCK (MISCELLANEOUS) IMPLANT

## 2024-03-26 NOTE — Progress Notes (Addendum)
  Progress Note  Patient Name: Vanessa Case Date of Encounter: 03/26/2024 [facility] Cardiologist: Sheryle Donning, MD   Interval Summary    Had chest pain through the night, rates a 3/10 this morning. Received several doses of SL NTG overnight.  Vital Signs Vitals:   03/26/24 0500 03/26/24 0534 03/26/24 0600 03/26/24 0700  BP: 116/83  109/68 106/77  Pulse: (!) 102  98 (!) 111  Resp: 15  15 14   Temp:  97.8 F (36.6 C)    TempSrc:  Oral    SpO2: 98%  99% 98%  Weight:      Height:       No intake or output data in the 24 hours ending 03/26/24 0914    03/25/2024    5:07 PM 12/11/2023    8:16 AM 11/26/2023    9:00 AM  Last 3 Weights  Weight (lbs) 184 lb 1.4 oz 184 lb 179 lb 14.3 oz  Weight (kg) 83.5 kg 83.462 kg 81.6 kg      Telemetry/ECG  Sinus Rhythm - Personally Reviewed  Physical Exam  GEN: No acute distress.   Neck: No JVD Cardiac: RRR, no murmurs, rubs, or gallops.  Respiratory: Clear to auscultation bilaterally. GI: Soft, nontender, non-distended  MS: No edema  Assessment & Plan   53 yo female with (NSTEMI 02/05/2020, DES-LCx), hypertension, hyperlipidemia, type 2 diabetes mellitus complicated by polyneuropathy, history of PTSD/anxiety/depression who presented with chest pain  Unstable angina CAD s/p prior stenting to the Lcx -- Initially developed episode of right arm pain with radiation up into her chest with picking up bags of groceries.  This resolved thereafter developed centralized chest pressure with nausea and 1 episode of vomiting.  Did use sublingual nitroglycerin  which resolved her symptoms and then had recurrence of chest pain. Several doses of SL NTG overnight.  -- High-sensitivity troponin negative x 2, EKG without acute ischemic changes -- She does report this is how she developed similar to prior stenting of her circumflex back in 2021 -- Planned for cardiac catheterization today -- on IV Heparin , aspirin , statin -- echo  pending  Informed Consent   Shared Decision Making/Informed Consent{ The risks [stroke (1 in 1000), death (1 in 1000), kidney failure [usually temporary] (1 in 500), bleeding (1 in 200), allergic reaction [possibly serious] (1 in 200)], benefits (diagnostic support and management of coronary artery disease) and alternatives of a cardiac catheterization were discussed in detail with [name] and she is willing to proceed.     HTN -- controlled this morning -- Continue metoprolol  XL 12.5 mg daily (UDS - cocaine this admission)  HLD -- LDL 105, HDL 39 -- Increase Crestor to 40 mg daily, continue Zetia   Substance abuse -- prior hx of cocaine use, bu urine tox negative for cocaine this admission. + THC  DM -- Hgb A1c 6.9 -- management per primary  Hypokalemia -- K+ 3.1, supplement   For questions or updates, please contact [facility] Please consult [email or web address] for contact info under       Signed, Johnie Nailer, NP

## 2024-03-26 NOTE — ED Notes (Signed)
 Pt called this RN to room to say she has "went too long without food".  Pt is diabetic.  Pt appears weak, she is slow to respond to questions. RR even and unlabored. Pt voice is weak and difficulty speaking in full sentences.  CBG 95.  Pt voiced she "does not do well with blood sugar under 100".  BP slightly elevated.  Dr Lelia Putnam notified.

## 2024-03-26 NOTE — Progress Notes (Signed)
 PHARMACY - ANTICOAGULATION CONSULT NOTE  Pharmacy Consult for heparin   Indication: chest pain/ACS  Allergies  Allergen Reactions   Percocet [Oxycodone -Acetaminophen ] Hives, Itching, Palpitations and Other (See Comments)    nightmares   Ambien  [Zolpidem ] Other (See Comments)    Sleepwalking episodes    Patient Measurements: Height: 5\' 5"  (165.1 cm) Weight: 83.5 kg (184 lb 1.4 oz) IBW/kg (Calculated) : 57 HEPARIN  DW (KG): 74.9  Vital Signs: Temp: 98 F (36.7 C) (06/06 0000) Temp Source: Oral (06/06 0000) BP: 108/74 (06/06 0200) Pulse Rate: 93 (06/06 0200)  Labs: Recent Labs    03/25/24 1400 03/25/24 1647 03/25/24 1648 03/26/24 0233  HGB 11.5*  --   --   --   HCT 35.0*  --   --   --   PLT 272  --   --   --   HEPARINUNFRC  --   --   --  0.18*  CREATININE  --  0.96  --   --   TROPONINIHS 3  --  4  --     Estimated Creatinine Clearance: 74.8 mL/min (by C-G formula based on SCr of 0.96 mg/dL).  Assessment: Vanessa Case presenting with chest pain. Pertinent PMH of coronary disease (NSTEMI 02/05/2020, DES-left circumflex. Pharmacy consulted for heparin . No PTA anticoagulation.   AM: heparin  level subtherapeutic on 900 units/hr. Per RN, no issues with heparin  running continuously or signs/symptoms of bleeding.  Goal of Therapy:  Heparin  level 0.3-0.7 units/ml Monitor platelets by anticoagulation protocol: Yes   Plan:  Give 2000 units bolus x 1 Start heparin  infusion at 1150 units/hr Check anti-Xa level in 6 hours and daily while on heparin  Continue to monitor H&H and platelets F/u plans for cath   Young Hensen, PharmD, BCPS Clinical Pharmacist 03/26/2024 2:58 AM

## 2024-03-26 NOTE — Interval H&P Note (Signed)
 History and Physical Interval Note:  03/26/2024 3:11 PM  Vanessa Case  has presented today for surgery, with the diagnosis of nstemi.  The various methods of treatment have been discussed with the patient and family. After consideration of risks, benefits and other options for treatment, the patient has consented to  Procedure(s): LEFT HEART CATH AND CORONARY ANGIOGRAPHY (N/A) as a surgical intervention.  The patient's history has been reviewed, patient examined, no change in status, stable for surgery.  I have reviewed the patient's chart and labs.  Questions were answered to the patient's satisfaction.   Cath Lab Visit (complete for each Cath Lab visit)  Clinical Evaluation Leading to the Procedure:   ACS: Yes.    Non-ACS:    Anginal Classification: CCS IV  Anti-ischemic medical therapy: Minimal Therapy (1 class of medications)  Non-Invasive Test Results: No non-invasive testing performed  Prior CABG: No previous CABG        Donata Fryer [facility] 03/26/2024 3:11 PM

## 2024-03-26 NOTE — Progress Notes (Signed)
 PHARMACY - ANTICOAGULATION CONSULT NOTE  Pharmacy Consult for continuous infusion, unfractionated heparin  administered intravenously.  Indication: chest pain/ACS  Allergies  Allergen Reactions   Percocet [Oxycodone -Acetaminophen ] Hives, Itching, Palpitations and Other (See Comments)    nightmares   Ambien  [Zolpidem ] Other (See Comments)    Sleepwalking episodes    Patient Measurements: Height: 5\' 5"  (165.1 cm) Weight: 83.5 kg (184 lb 1.4 oz) IBW/kg (Calculated) : 57 HEPARIN  DW (KG): 74.9  Vital Signs: Temp: 97.6 F (36.4 C) (06/06 1200) Temp Source: Oral (06/06 1200) BP: 126/76 (06/06 1200) Pulse Rate: 91 (06/06 1200)  Labs: Recent Labs    03/25/24 1400 03/25/24 1647 03/25/24 1648 03/26/24 0233 03/26/24 0535 03/26/24 1125  HGB 11.5*  --   --   --  11.0*  --   HCT 35.0*  --   --   --  33.7*  --   PLT 272  --   --   --  250  --   HEPARINUNFRC  --   --   --  0.18*  --  0.26*  CREATININE  --  0.96  --   --  0.72  --   TROPONINIHS 3  --  4  --   --   --     Estimated Creatinine Clearance: 89.8 mL/min (by C-G formula based on SCr of 0.72 mg/dL).   Medical History: Past Medical History:  Diagnosis Date   Anemia    Anxiety    Arthritis    Cholelithiasis with acute cholecystitis 06/28/2013   Chronic abdominal pain    Chronic back pain    Chronic headache    Chronic neck pain    Coronary artery disease    Depression    Diabetes mellitus    adult onset dm-maintained on glipizide  and metformin , pt states fasting glucose runs 110   Fibromyalgia    GERD (gastroesophageal reflux disease)    HLD (hyperlipidemia) 05/30/2015   Hyperlipidemia    Hypertension    maintained on lisinopril  hct, metoprolol -134/86 at pat visit   Neuromuscular disorder (HCC)    Neuropathy    NSTEMI (non-ST elevated myocardial infarction) (HCC) 02/05/2020   Obesity    PONV (postoperative nausea and vomiting)    PTSD (post-traumatic stress disorder)    Right arm weakness 03/05/2013    Substance abuse (HCC)     Medications:  Scheduled:   aspirin  EC  81 mg Oral Daily   empagliflozin   10 mg Oral QAC breakfast   ezetimibe   10 mg Oral Daily   FLUoxetine   60 mg Oral Daily   gabapentin   600 mg Oral Q8H   heparin   1,250 Units Intravenous Once   insulin  aspart  0-15 Units Subcutaneous TID WC   metoprolol  succinate  12.5 mg Oral Daily   potassium chloride   40 mEq Oral Q4H   prazosin   1 mg Oral QHS   QUEtiapine   50 mg Oral QHS   rosuvastatin  40 mg Oral q1800    Assessment: Heparin  level was supposed to have been collected at 1000 hours today. Was not collected until 1125 hours, result is 0.26 units/mL. Bag infusing without compromise or interruption. No bleeding per RN assessment in the ED.   Goal of Therapy:  Heparin  level 0.3-0.7 units/ml Monitor platelets by anticoagulation protocol: Yes   Plan:  Give 1250 units bolus x 1 Increase CIUFH drip to 1350 units/hr (13.5 mL on pump). Evaluate response by HL at ~2100 hours. Time may change based upon if CIUFH is interrupted/stopped during  LHC.  Kenda Paula, PharmD, CPP Clinical Pharmacist Practitioner 03/26/2024,1:04 PM

## 2024-03-26 NOTE — Progress Notes (Signed)
 Subjective:  No significant events overnight. Patient reports her chest pain has improved from a 6/10 to 3/10. She denies anymore episodes of diaphoresis, nausea, vomiting, or shortness of breath. Her chest pain is constant but does not worsen when walking around the room. Patient reports she was previously prescribed atorvastatin  but stopped taking it due to myalgia, but she would be open to trying rosuvastatin. She has good adherence with the daily ezetimibe  and aspirin .  Objective:  Vital signs in last 24 hours: Vitals:   03/26/24 0600 03/26/24 0700 03/26/24 0800 03/26/24 0936  BP: 109/68 106/77 120/72   Pulse: 98 (!) 111 88   Resp: 15 14 20    Temp:    97.6 F (36.4 C)  TempSrc:    Oral  SpO2: 99% 98% 99%   Weight:      Height:       General: Well-appearing female sitting upright in bed, talking in full sentences, in NAD HENT: Normocephalic and atraumatic. No nasal congestion or rhinorrhea. MMM. Eyes: Conjunctivae clear. EOMI. Cardiovascular: Normal rate with regular rhythm. No murmurs, rubs, or gallops. No LE edema.  Pulmonary: Lungs CTAB. No wheezes, rales, rhonchi, or crackles. Normal respiratory effort on room air. Abdominal: Soft. Non-distended. No tenderness to palpation. Normal bowel sounds.  Neurological: Alert, responds appropriately to questions. Able to walk without assistance. Skin: Warm and dry. Cap refill < 2 seconds.  Assessment/Plan:  Principal Problem:   Unstable angina (HCC) Active Problems:   Type 2 diabetes mellitus with diabetic polyneuropathy (HCC)   Essential hypertension   Major depressive disorder, recurrent, severe without psychotic features (HCC)   Anemia  Letonia Silena Kreps is a 53 y.o. female with PMH of NSTEMI s/p LCX DES in 01/2020, T2DM, HTN, HLD, GAD, PTSD, and depression who presented with chest pain, admitted on 03/25/2024 for unstable angina.  #Unstable Angina #CAD s/p LCX DES in 01/2020 Patient presented with chest pain concerning  for unstable angina given normal EKG, normal troponin, and chest pain similar to her NSTEMI in 01/2020, which required an LCX DES for 99% stenosed lesion. She was started on a heparin  infusion in the ED with plans to undergo left heart catheterization today. Cardiology also recommended repeat echocardiogram while hospitalized since last echocardiogram was completed in 01/2020. Regarding medical management, will start rosuvastatin 40 mg daily and metoprolol  succinate 12.5 mg daily. Will consider increasing to metoprolol  25 mg daily before discharge if tolerated by her heart rate and blood pressure. Will continue home ezetimibe  10 mg daily and aspirin  81 mg daily. Will optimize management of co-morbid conditions as outlined below. - Cardiology consulted; appreciate recs - Left heart catheterization today - Echocardiogram - Continue IV Heparin  infusion 1150 units/hr - Continue Nitroglycerin  SL 0.4 mg Q5 min PRN - Start rosuvastatin 40 mg daily - Start metoprolol  succinate 12.5 mg daily -> may increase to 25 mg later - Home ezetimibe  10 mg daily  - Home aspirin  81 mg daily  #Hypertension Patient was initially hypertensive to 173/95 upon presentation to the ED. Blood pressure improved with SBPs 106-120 this morning after receiving Imdur  30 mg x1 @ 1858. Will continue holding home irbesartan  in anticipation of IV contrast during LHC with plans to resume if kidney function stable. Will discontinue home amlodipine  for now since no renal benefits. Will discontinue Imdur  since chest pain stable with SL nitroglycerin . - Hold home irbesartan  - Stop amlodipine  5 mg daily - Stop imdur  30 mg daily - Metoprolol  succinate 12.5 mg daily as above  #Type  2 Diabetes Admission HgbA1c 6.9% down from 10.1% in 10/2023. Will continue home Jardiance  10 mg daily while hospitalized. Will hold home metformin  and Ozempic  with plans to resume at discharge. - Home Jardiance  10 mg daily - Hold home metformin  1000 mg BID - Hold  home Ozempic   #Hyperlipidemia Repeat lipid panel with LDL 105, HDL 39, triglycerides 171, and total cholesterol 171. Will continue home ezetimibe  10 mg daily. Will start rosuvastatin 40 mg daily as above. - Start rosuvastatin 40 mg daily - Home ezetimibe  10 mg daily   #Normocytic Anemia Admission labs notable for hemoglobin 11.5 and MCV 86.2. Anemia workup was completed with normal ferritin (89), normal iron (63), normal TIBC (357), normal saturation (18%), and normal reticulocytes (1.2%). Etiology likely mixed iron deficiency anemia and anemia of chronic disease given normal MCV and iron studies. Total iron deficit 591 mg per [facility]. Will start ferrous sulfate 325 mg daily, but can consider IV iron if echocardiogram shows heart failure based on new recommendations for IV iron until TSAT > 20%. - Start ferrous sulfate 325 mg daily  #Chronic Back and Neck Pain #Post-herpetic Neuralgia #Diabetic Neuropathy #Right Rotator Cuff Tear - BP cuff preferred on left arm if possible - Home gabapentin  600 mg TID - Avoid NSAIDs given CAD  #Anxiety #Depression #PTSD #Bipolar II disorder - Home fluoxetine  60 mg every morning - Home quetiapine  50 mg nightly - Home trazodone  50 mg nightly   LOS: 1 day   Elmira Haddock, Medical Student 03/26/2024, 10:44 AM

## 2024-03-26 NOTE — H&P (View-Only) (Signed)
  Progress Note  Patient Name: Vanessa Case Date of Encounter: 03/26/2024 [facility] Cardiologist: Sheryle Donning, MD   Interval Summary    Had chest pain through the night, rates a 3/10 this morning. Received several doses of SL NTG overnight.  Vital Signs Vitals:   03/26/24 0500 03/26/24 0534 03/26/24 0600 03/26/24 0700  BP: 116/83  109/68 106/77  Pulse: (!) 102  98 (!) 111  Resp: 15  15 14   Temp:  97.8 F (36.6 C)    TempSrc:  Oral    SpO2: 98%  99% 98%  Weight:      Height:       No intake or output data in the 24 hours ending 03/26/24 0914    03/25/2024    5:07 PM 12/11/2023    8:16 AM 11/26/2023    9:00 AM  Last 3 Weights  Weight (lbs) 184 lb 1.4 oz 184 lb 179 lb 14.3 oz  Weight (kg) 83.5 kg 83.462 kg 81.6 kg      Telemetry/ECG  Sinus Rhythm - Personally Reviewed  Physical Exam  GEN: No acute distress.   Neck: No JVD Cardiac: RRR, no murmurs, rubs, or gallops.  Respiratory: Clear to auscultation bilaterally. GI: Soft, nontender, non-distended  MS: No edema  Assessment & Plan   53 yo female with (NSTEMI 02/05/2020, DES-LCx), hypertension, hyperlipidemia, type 2 diabetes mellitus complicated by polyneuropathy, history of PTSD/anxiety/depression who presented with chest pain  Unstable angina CAD s/p prior stenting to the Lcx -- Initially developed episode of right arm pain with radiation up into her chest with picking up bags of groceries.  This resolved thereafter developed centralized chest pressure with nausea and 1 episode of vomiting.  Did use sublingual nitroglycerin  which resolved her symptoms and then had recurrence of chest pain. Several doses of SL NTG overnight.  -- High-sensitivity troponin negative x 2, EKG without acute ischemic changes -- She does report this is how she developed similar to prior stenting of her circumflex back in 2021 -- Planned for cardiac catheterization today -- on IV Heparin , aspirin , statin -- echo  pending  Informed Consent   Shared Decision Making/Informed Consent{ The risks [stroke (1 in 1000), death (1 in 1000), kidney failure [usually temporary] (1 in 500), bleeding (1 in 200), allergic reaction [possibly serious] (1 in 200)], benefits (diagnostic support and management of coronary artery disease) and alternatives of a cardiac catheterization were discussed in detail with [name] and she is willing to proceed.     HTN -- controlled this morning -- Continue metoprolol  XL 12.5 mg daily (UDS - cocaine this admission)  HLD -- LDL 105, HDL 39 -- Increase Crestor to 40 mg daily, continue Zetia   Substance abuse -- prior hx of cocaine use, bu urine tox negative for cocaine this admission. + THC  DM -- Hgb A1c 6.9 -- management per primary  Hypokalemia -- K+ 3.1, supplement   For questions or updates, please contact [facility] Please consult [email or web address] for contact info under       Signed, Johnie Nailer, NP

## 2024-03-26 NOTE — ED Notes (Signed)
 Pt transported to cath lab. RR even and unlabored.

## 2024-03-27 ENCOUNTER — Inpatient Hospital Stay (HOSPITAL_COMMUNITY): Payer: MEDICAID

## 2024-03-27 ENCOUNTER — Other Ambulatory Visit (HOSPITAL_COMMUNITY): Payer: Self-pay

## 2024-03-27 DIAGNOSIS — R079 Chest pain, unspecified: Secondary | ICD-10-CM

## 2024-03-27 LAB — GLUCOSE, CAPILLARY
Glucose-Capillary: 201 mg/dL — ABNORMAL HIGH (ref 70–99)
Glucose-Capillary: 144 mg/dL — ABNORMAL HIGH (ref 70–99)

## 2024-03-27 LAB — ECHOCARDIOGRAM COMPLETE
AR max vel: 2.61 cm2
AV Mean grad: 3 mmHg
AV Peak grad: 5.1 mmHg
Ao pk vel: 1.13 m/s
Area-P 1/2: 3.65 cm2
Height: 65 in
S' Lateral: 2.4 cm
Weight: 2945.35 [oz_av]

## 2024-03-27 LAB — CBC
HCT: 34.8 % — ABNORMAL LOW (ref 36.0–46.0)
Hemoglobin: 11.6 g/dL — ABNORMAL LOW (ref 12.0–15.0)
MCH: 28.9 pg (ref 26.0–34.0)
MCHC: 33.3 g/dL (ref 30.0–36.0)
MCV: 86.6 fL (ref 80.0–100.0)
Platelets: 257 10*3/uL (ref 150–400)
RBC: 4.02 MIL/uL (ref 3.87–5.11)
RDW: 13.8 % (ref 11.5–15.5)
WBC: 7 10*3/uL (ref 4.0–10.5)
nRBC: 0 % (ref 0.0–0.2)

## 2024-03-27 LAB — RENAL FUNCTION PANEL
Albumin: 3.5 g/dL (ref 3.5–5.0)
Anion gap: 7 (ref 5–15)
BUN: 5 mg/dL — ABNORMAL LOW (ref 6–20)
CO2: 20 mmol/L — ABNORMAL LOW (ref 22–32)
Calcium: 8.6 mg/dL — ABNORMAL LOW (ref 8.9–10.3)
Chloride: 110 mmol/L (ref 98–111)
Creatinine, Ser: 0.84 mg/dL (ref 0.44–1.00)
GFR, Estimated: >60 mL/min (ref >60)
Glucose, Bld: 159 mg/dL — ABNORMAL HIGH (ref 70–99)
Phosphorus: 3.3 mg/dL (ref 2.5–4.6)
Potassium: 3.8 mmol/L (ref 3.5–5.1)
Sodium: 137 mmol/L (ref 135–145)

## 2024-03-27 LAB — MAGNESIUM: Magnesium: 2.1 mg/dL (ref 1.7–2.4)

## 2024-03-27 MED ORDER — ROSUVASTATIN CALCIUM 40 MG PO TABS
40.0000 mg | ORAL_TABLET | Freq: Every day | ORAL | 0 refills | Status: DC
  Filled 2024-03-27: qty 30, 30d supply, fill #0

## 2024-03-27 MED ORDER — ACETAMINOPHEN 500 MG PO TABS: 1000.0000 mg | ORAL_TABLET | Freq: Three times a day (TID) | ORAL | Status: DC | PRN

## 2024-03-27 MED ORDER — NITROGLYCERIN 0.4 MG SL SUBL
0.4000 mg | SUBLINGUAL_TABLET | SUBLINGUAL | 0 refills | Status: AC | PRN
  Filled 2024-03-27: qty 25, 5d supply, fill #0

## 2024-03-27 MED ORDER — METOPROLOL SUCCINATE ER 25 MG PO TB24
12.5000 mg | ORAL_TABLET | Freq: Once | ORAL | Status: AC
  Administered 2024-03-27: 12.5 mg via ORAL
  Filled 2024-03-27: qty 1

## 2024-03-27 MED ORDER — TRAZODONE HCL 50 MG PO TABS: 50.0000 mg | ORAL_TABLET | Freq: Every evening | ORAL | Status: AC | PRN

## 2024-03-27 MED ORDER — ACETAMINOPHEN 500 MG PO TABS
1000.0000 mg | ORAL_TABLET | Freq: Three times a day (TID) | ORAL | 0 refills | Status: DC | PRN
  Filled 2024-03-27: qty 30, 5d supply, fill #0

## 2024-03-27 MED ORDER — METOPROLOL SUCCINATE ER 25 MG PO TB24: 25.0000 mg | ORAL_TABLET | Freq: Every day | ORAL | Status: DC

## 2024-03-27 MED ORDER — FERROUS SULFATE 325 (65 FE) MG PO TABS
325.0000 mg | ORAL_TABLET | Freq: Every day | ORAL | 0 refills | Status: DC
  Filled 2024-03-27: qty 30, 30d supply, fill #0

## 2024-03-27 MED ORDER — METOPROLOL SUCCINATE ER 25 MG PO TB24
25.0000 mg | ORAL_TABLET | Freq: Every day | ORAL | 0 refills | Status: DC
  Filled 2024-03-27: qty 30, 30d supply, fill #0

## 2024-03-27 NOTE — Progress Notes (Signed)
   Rounding Note    Patient Name: Vanessa Case Date of Encounter: 03/27/2024  [facility] Cardiologist: Sheryle Donning, MD   Subjective   No acute events overnight.  Feeling well this morning.  No chest pain.  Vital Signs    Vitals:   03/26/24 2148 03/27/24 0500 03/27/24 0742 03/27/24 1028  BP: (!) 143/82 (!) 147/72 109/66 124/74  Pulse: 91  96 92  Resp: 20 16 16 20   Temp: 98.2 F (36.8 C) 98.8 F (37.1 C) 97.8 F (36.6 C)   TempSrc: Oral Oral Oral   SpO2: 100% 100% 100% 100%  Weight:      Height:        Intake/Output Summary (Last 24 hours) at 03/27/2024 1159 Last data filed at 03/27/2024 0743 Gross per 24 hour  Intake 900 ml  Output --  Net 900 ml      03/25/2024    5:07 PM 12/11/2023    8:16 AM 11/26/2023    9:00 AM  Last 3 Weights  Weight (lbs) 184 lb 1.4 oz 184 lb 179 lb 14.3 oz  Weight (kg) 83.5 kg 83.462 kg 81.6 kg      Physical Exam   GEN: No acute distress.   Cardiac: RRR, no murmurs, rubs, or gallops.  Respiratory: Clear to auscultation bilaterally. Psych: Normal affect   Assessment & Plan    #Chest pain Resolved.  Left heart cath without obstructive coronary artery disease.  I suspect chest pain could have been musculoskeletal. Echo shows normal EF without significant valvular abnormalities  #Hypertension Controlled  Cardiology will sign off.  Please call with questions or concerns.  Donelda Fujita T. Marven Slimmer, MD, [facility], [facility] Cardiac Electrophysiology

## 2024-03-27 NOTE — Discharge Summary (Signed)
 Name: Vanessa Case MRN: [id number] DOB: 10/16/1971 53 y.o. PCP: Nooruddin, Saad, MD  Date of Admission: 03/25/2024  1:51 PM Date of Discharge: 03/27/2024 Attending Physician: Dr. Ancil Balzarine  Discharge Diagnosis: Principal Problem:   Unstable angina [facility]) Active Problems:   Type 2 diabetes mellitus with diabetic polyneuropathy (HCC)   Essential hypertension   Major depressive disorder, recurrent, severe without psychotic features (HCC)   Anemia    Discharge Medications: Allergies as of 03/27/2024       Reactions   Percocet [oxycodone -acetaminophen ] Hives, Itching, Palpitations, Other (See Comments)   nightmares   Ambien  [zolpidem ] Other (See Comments)   Sleepwalking episodes        Medication List     PAUSE taking these medications    amLODipine  5 MG tablet Wait to take this until your doctor or other care provider tells you to start again. Commonly known as: NORVASC  Take 1 tablet (5 mg total) by mouth daily.   irbesartan  150 MG tablet Wait to take this until your doctor or other care provider tells you to start again. Commonly known as: AVAPRO  Take 150 mg by mouth daily.       STOP taking these medications    amoxicillin  500 MG capsule Commonly known as: AMOXIL    hydrOXYzine  25 MG tablet Commonly known as: ATARAX    mirtazapine  15 MG tablet Commonly known as: REMERON        TAKE these medications    acetaminophen  500 MG tablet Commonly known as: TYLENOL  Take 2 tablets (1,000 mg total) by mouth every 8 (eight) hours as needed for mild pain (pain score 1-3).   aspirin  EC 81 MG tablet Take 1 tablet (81 mg total) by mouth daily.   empagliflozin  10 MG Tabs tablet Commonly known as: JARDIANCE  Take 1 tablet (10 mg total) by mouth daily before breakfast.   ezetimibe  10 MG tablet Commonly known as: ZETIA  Take 1 tablet (10 mg total) by mouth daily.   ferrous sulfate  325 (65 FE) MG tablet Take 1 tablet (325 mg total) by mouth daily with  breakfast. Start taking on: March 28, 2024   FLUoxetine  20 MG capsule Commonly known as: PROzac  Take 3 capsules (60 mg total) by mouth daily.   fluticasone  44 MCG/ACT inhaler Commonly known as: FLOVENT  HFA Inhale 2 puffs into the lungs 2 (two) times daily as needed (for shortness of breath).   gabapentin  300 MG capsule Commonly known as: NEURONTIN  Take 2 capsules (600 mg total) by mouth 3 (three) times daily. TAKE 2 CAPSULE BY MOUTH IN THE MORNING, 2 CAPSULE IN THE EVENING, AND 2 CAPSULES AT NIGHT What changed: additional instructions   hydrOXYzine  25 MG capsule Commonly known as: VISTARIL  Take 25-50 mg by mouth 2 (two) times daily as needed for anxiety or itching.   metFORMIN  1000 MG tablet Commonly known as: GLUCOPHAGE  Take 1 tablet (1,000 mg total) by mouth 2 (two) times daily with a meal.   metoprolol  succinate 25 MG 24 hr tablet Commonly known as: TOPROL -XL Take 1 tablet (25 mg total) by mouth daily. Start taking on: March 28, 2024   naltrexone  50 MG tablet Commonly known as: DEPADE Take 50 mg by mouth daily as needed (For urges and cravings).   nitroGLYCERIN  0.4 MG SL tablet Commonly known as: NITROSTAT  Place 1 tablet (0.4 mg total) under the tongue every 5 (five) minutes as needed for chest pain.   ONE-A-DAY WOMENS FORMULA PO Take 1 tablet by mouth daily.   prazosin  1 MG capsule Commonly known  as: MINIPRESS  Take 1 capsule (1 mg total) by mouth at bedtime. To prevent nightmares   QUEtiapine  50 MG tablet Commonly known as: SEROQUEL  Take 1 tablet (50 mg total) by mouth at bedtime.   rosuvastatin  40 MG tablet Commonly known as: CRESTOR  Take 1 tablet (40 mg total) by mouth daily at 6 PM.   Semaglutide  (1 MG/DOSE) 4 MG/3ML Sopn Inject 1 mg into the skin once a week. What changed: how much to take   traZODone  50 MG tablet Commonly known as: DESYREL  Take 1 tablet (50 mg total) by mouth at bedtime as needed for sleep. What changed:  when to take this reasons to  take this        Disposition and follow-up:   Vanessa Case was discharged from [facility] in Good condition.  At the hospital follow up visit please address:  1.  Follow-up:  a.  Reevaluate for recurrent angina and consider increasing metoprolol  if her heart rate can tolerate.    b.  Reevaluate blood pressure and resume ARB if hypertensive.   c.  Ensure she gets follow-up with cardiology.   d.  Recheck lipid panel in 6 weeks.  Follow-up Appointments:  Follow-up Information     Jearldine Mina, DO. Go on 03/31/2024.   Specialty: Internal Medicine Why: at 2:45 PM, please arrive 15 minutes early and bring your medications and this packet with you Contact information: [address], [address] [place] [address] [phone]                 Hospital Course by problem list: Vanessa Case is a 53 y.o. female with PMH of NSTEMI s/p LCX DES in 01/2020, T2DM, HTN, HLD, GAD, PTSD, and depression who presented with chest pain, admitted on 03/25/2024 for unstable angina.   #Unstable Angina #CAD s/p LCX DES in 01/2020 Patient presented with chest pain concerning for unstable angina given normal EKG, normal troponin, and chest pain similar to her NSTEMI in 01/2020, which required an LCX DES for 99% stenosed lesion. She underwent left heart catheterization, which showed patent left circumflex stent and no other obstructive coronary disease. Echocardiogram was also repeated with normal results.  Regarding medical management, she was started on rosuvastatin  40 mg daily and metoprolol  succinate 25 mg daily. Her home ezetimibe  10 mg daily and aspirin  81 mg daily were continued during hospitalization.  On the day of discharge she had some exertional chest pain that was responsive to nitroglycerin  without any other vital sign changes.  Metoprolol  was increased to 25 mg as above and she was discharged with as needed sublingual nitroglycerin  and close clinic  follow-up on 03/31/2024.  Management of her co-morbid conditions were optimized as outlined below.   #Hypertension Patient was initially hypertensive to 173/95 upon presentation to the ED. Blood pressure improved with SBPs 106-120 on 6/6 after receiving Imdur  30 mg x1 @ 1858. Her home irbesartan  was held in anticipation of IV contrast during LHC.  We continue to hold her irbesartan  and amlodipine  in the setting of normal blood pressures while increasing metoprolol  for its antianginal effect.  She is instructed to bring these medications to her clinic appointment with recommendations to resume the irbesartan  if her blood pressure is elevated and then amlodipine  if needed after that.   #Type 2 Diabetes Admission HgbA1c 6.9% down from 10.1% in 10/2023. Patient was continued on home Jardiance  10 mg daily while hospitalized. Her home metformin  and Ozempic  were held but should be resumed  at discharge.   #Hyperlipidemia Repeat lipid panel with LDL 105, HDL 39, triglycerides 171, and total cholesterol 171. Patient was started on rosuvastatin  40 mg daily in addition to home ezetimibe  10 mg daily. She should continue both of the medications after discharge.   #Normocytic Anemia Admission labs notable for hemoglobin 11.5 and MCV 86.2. Anemia workup was completed with normal ferritin (89), normal iron (63), normal TIBC (357), normal saturation (18%), and normal reticulocytes (1.2%). Etiology likely mixed iron deficiency anemia and anemia of chronic disease given normal MCV and iron studies. Total iron deficit 591 mg per [facility], so patient was started on ferrous sulfate  325 mg daily. Since echocardiogram showed normal left ventricular ejection fraction, patient did not receive IV iron even though TSAT < 20%.   #Chronic Back and Neck Pain #Post-herpetic Neuralgia #Diabetic Neuropathy #Right Rotator Cuff Tear Patient was continued on home gabapentin  600 mg TID while hospitalized. She should avoid NSAIDs  when possible moving forward given her CAD.   #Anxiety #Depression #PTSD #Bipolar II disorder Patient was continued on her home fluoxetine  60 mg every morning, quetiapine  50 mg nightly, and trazodone  50 mg nightly while hospitalized.   Discharge Subjective: Patient is doing well this morning without any new or worsening complaints.  She did have a little bit of swelling on her right wrist after cardiac cath but this has resolved.  While walking today she had some exertional chest pain that resolved after nitroglycerin .  She understands when and how to take this medication and is ready to go home.  Discharge Exam:   BP 124/74 (BP Location: Left Arm)   Pulse 80   Temp 97.8 F (36.6 C) (Oral)   Resp 15   Ht 5\' 5"  (1.651 m)   Wt 83.5 kg   LMP 10/07/2011   SpO2 98%   BMI 30.63 kg/m  Constitutional: well-appearing adult female sitting in bed, in no acute distress Cardiovascular: regular rate and rhythm, no m/r/g Pulmonary/Chest: normal work of breathing on room air, lungs clear to auscultation bilaterally MSK: normal bulk and tone Neurological: alert & oriented x 3 Skin: warm and dry  Pertinent Labs, Studies, and Procedures:     Latest Ref Rng & Units 03/27/2024    4:00 AM 03/26/2024    5:35 AM 03/25/2024    2:00 PM  CBC  WBC 4.0 - 10.5 K/uL 7.0  7.4  6.4   Hemoglobin 12.0 - 15.0 g/dL 21.3  08.6  57.8   Hematocrit 36.0 - 46.0 % 34.8  33.7  35.0   Platelets 150 - 400 K/uL 257  250  272        Latest Ref Rng & Units 03/27/2024    4:00 AM 03/26/2024    9:05 PM 03/26/2024    5:35 AM  CMP  Glucose 70 - 99 mg/dL 469  629  528   BUN 6 - 20 mg/dL 5  5  7    Creatinine 0.44 - 1.00 mg/dL 4.13  2.44  0.10   Sodium 135 - 145 mmol/L 137  137  140   Potassium 3.5 - 5.1 mmol/L 3.8  3.6  3.1   Chloride 98 - 111 mmol/L 110  106  111   CO2 22 - 32 mmol/L 20  20  18    Calcium  8.9 - 10.3 mg/dL 8.6  9.3  9.2     CARDIAC CATHETERIZATION Result Date: 03/26/2024   Prox LAD to Mid LAD lesion is 20%  stenosed with 30% stenosed side branch  in 1st Diag.   Mid Cx lesion is 20% stenosed.   Dist LAD lesion is 50% stenosed.   Non-stenotic Prox Cx to Mid Cx lesion was previously treated.   The left ventricular systolic function is normal.   LV end diastolic pressure is normal.   The left ventricular ejection fraction is 55-65% by visual estimate. Nonobstructive CAD. Prior stent is widely patent Normal LV function Normal LVEDP Plan: consider other causes of chest pain. Continue medical management.   DG Chest 2 View Result Date: 03/25/2024 CLINICAL DATA:  chest pain EXAM: CHEST - 2 VIEW COMPARISON:  November 26, 2023 FINDINGS: No focal airspace consolidation, pleural effusion, or pneumothorax. No cardiomegaly. No acute fracture or destructive lesion. Multilevel thoracic osteophytosis. Cholecystectomy clips. IMPRESSION: No acute cardiopulmonary abnormality. Electronically Signed   By: Rance Burrows M.D.   On: 03/25/2024 15:16     Discharge Instructions: Discharge Instructions     Diet - low sodium heart healthy   Complete by: As directed    Increase activity slowly   Complete by: As directed        Discharge Instructions      Zamyia Teddie Curd,  You were recently admitted to [facility] for chest pain.  Your cardiac catheterization showed your stent is still open and working normally and that there are no other blockages in your coronary arteries.  We also did an echocardiogram which showed that your heart is functioning normally.  We have changed your medications as below and would like you to follow-up with your primary care provider and cardiology.  Continue taking your home medications with the following changes  Start taking Metoprolol  25 mg daily Ferrous sulfate  325 mg daily Rosuvastatin  40 mg daily Nitroglycerin  0.5 mg every 5 minutes as needed for chest pain Stop taking and talk to your primary care provider about these and follow-up Amlodipine  Irbesartan    You  should seek further medical care if if you have return of your chest pain that is not responsive to the nitroglycerin  tablets or you have any other worrisome symptoms.  We recommend that you see your primary care doctor in about a week to make sure that you continue to improve. We are so glad that you are feeling better.  Sincerely, Cleven Dallas, DO    Signed: Cleven Dallas, DO Internal Medicine Resident, PGY-2 Pager# [phone] 03/27/2024, 2:10 PM   Please contact the on call pager after 5 pm and on weekends at [phone].

## 2024-03-27 NOTE — Progress Notes (Signed)
 Echocardiogram 2D Echocardiogram has been performed.  Morine Kohlman N Calea Hribar,RDCS 03/27/2024, 9:15 AM

## 2024-03-27 NOTE — Discharge Instructions (Signed)
 Vanessa Case,  You were recently admitted to [facility] for chest pain.  Your cardiac catheterization showed your stent is still open and working normally and that there are no other blockages in your coronary arteries.  We also did an echocardiogram which showed that your heart is functioning normally.  We have changed your medications as below and would like you to follow-up with your primary care provider and cardiology.  Continue taking your home medications with the following changes  Start taking Metoprolol  25 mg daily Ferrous sulfate  325 mg daily Rosuvastatin  40 mg daily Nitroglycerin  0.5 mg every 5 minutes as needed for chest pain Stop taking and talk to your primary care provider about these and follow-up Amlodipine  Irbesartan    You should seek further medical care if if you have return of your chest pain that is not responsive to the nitroglycerin  tablets or you have any other worrisome symptoms.  We recommend that you see your primary care doctor in about a week to make sure that you continue to improve. We are so glad that you are feeling better.  Sincerely, Cleven Dallas, DO

## 2024-03-27 NOTE — Plan of Care (Signed)
  Problem: Education: Goal: Ability to describe self-care measures that may prevent or decrease complications (Diabetes Survival Skills Education) will improve 03/27/2024 0437 by Christyne Crane, RN Outcome: Progressing 03/27/2024 0437 by Christyne Crane, RN Outcome: Progressing Goal: Individualized Educational Video(s) 03/27/2024 0437 by Christyne Crane, RN Outcome: Progressing 03/27/2024 0437 by Christyne Crane, RN Outcome: Progressing   Problem: Coping: Goal: Ability to adjust to condition or change in health will improve 03/27/2024 0437 by Christyne Crane, RN Outcome: Progressing 03/27/2024 0437 by Christyne Crane, RN Outcome: Progressing   Problem: Fluid Volume: Goal: Ability to maintain a balanced intake and output will improve 03/27/2024 0437 by Christyne Crane, RN Outcome: Progressing 03/27/2024 0437 by Christyne Crane, RN Outcome: Progressing   Problem: Health Behavior/Discharge Planning: Goal: Ability to identify and utilize available resources and services will improve 03/27/2024 0437 by Christyne Crane, RN Outcome: Progressing 03/27/2024 0437 by Christyne Crane, RN Outcome: Progressing Goal: Ability to manage health-related needs will improve Outcome: Progressing   Problem: Metabolic: Goal: Ability to maintain appropriate glucose levels will improve Outcome: Progressing   Problem: Nutritional: Goal: Maintenance of adequate nutrition will improve Outcome: Progressing Goal: Progress toward achieving an optimal weight will improve Outcome: Progressing   Problem: Skin Integrity: Goal: Risk for impaired skin integrity will decrease Outcome: Progressing   Problem: Tissue Perfusion: Goal: Adequacy of tissue perfusion will improve Outcome: Progressing   Problem: Education: Goal: Knowledge of General Education information will improve Description: Including pain rating scale, medication(s)/side effects and non-pharmacologic comfort  measures Outcome: Progressing   Problem: Health Behavior/Discharge Planning: Goal: Ability to manage health-related needs will improve Outcome: Progressing   Problem: Clinical Measurements: Goal: Ability to maintain clinical measurements within normal limits will improve Outcome: Progressing Goal: Will remain free from infection Outcome: Progressing Goal: Diagnostic test results will improve Outcome: Progressing Goal: Respiratory complications will improve Outcome: Progressing Goal: Cardiovascular complication will be avoided Outcome: Progressing   Problem: Activity: Goal: Risk for activity intolerance will decrease Outcome: Progressing   Problem: Nutrition: Goal: Adequate nutrition will be maintained Outcome: Progressing   Problem: Coping: Goal: Level of anxiety will decrease Outcome: Progressing   Problem: Elimination: Goal: Will not experience complications related to bowel motility Outcome: Progressing Goal: Will not experience complications related to urinary retention Outcome: Progressing   Problem: Pain Managment: Goal: General experience of comfort will improve and/or be controlled Outcome: Progressing   Problem: Safety: Goal: Ability to remain free from injury will improve Outcome: Progressing   Problem: Skin Integrity: Goal: Risk for impaired skin integrity will decrease Outcome: Progressing   Problem: Education: Goal: Understanding of CV disease, CV risk reduction, and recovery process will improve Outcome: Progressing Goal: Individualized Educational Video(s) Outcome: Progressing   Problem: Activity: Goal: Ability to return to baseline activity level will improve Outcome: Progressing   Problem: Cardiovascular: Goal: Ability to achieve and maintain adequate cardiovascular perfusion will improve Outcome: Progressing Goal: Vascular access site(s) Level 0-1 will be maintained Outcome: Progressing   Problem: Health Behavior/Discharge  Planning: Goal: Ability to safely manage health-related needs after discharge will improve Outcome: Progressing

## 2024-03-27 NOTE — Plan of Care (Signed)
  Problem: Education: Goal: Ability to describe self-care measures that may prevent or decrease complications (Diabetes Survival Skills Education) will improve Outcome: Adequate for Discharge Goal: Individualized Educational Video(s) Outcome: Adequate for Discharge   Problem: Coping: Goal: Ability to adjust to condition or change in health will improve Outcome: Adequate for Discharge   Problem: Fluid Volume: Goal: Ability to maintain a balanced intake and output will improve Outcome: Adequate for Discharge   Problem: Health Behavior/Discharge Planning: Goal: Ability to identify and utilize available resources and services will improve Outcome: Adequate for Discharge Goal: Ability to manage health-related needs will improve Outcome: Adequate for Discharge   Problem: Metabolic: Goal: Ability to maintain appropriate glucose levels will improve Outcome: Adequate for Discharge   Problem: Nutritional: Goal: Maintenance of adequate nutrition will improve Outcome: Adequate for Discharge Goal: Progress toward achieving an optimal weight will improve Outcome: Adequate for Discharge   Problem: Skin Integrity: Goal: Risk for impaired skin integrity will decrease Outcome: Adequate for Discharge   Problem: Tissue Perfusion: Goal: Adequacy of tissue perfusion will improve Outcome: Adequate for Discharge   Problem: Education: Goal: Knowledge of General Education information will improve Description: Including pain rating scale, medication(s)/side effects and non-pharmacologic comfort measures Outcome: Adequate for Discharge   Problem: Health Behavior/Discharge Planning: Goal: Ability to manage health-related needs will improve Outcome: Adequate for Discharge   Problem: Clinical Measurements: Goal: Ability to maintain clinical measurements within normal limits will improve Outcome: Adequate for Discharge Goal: Will remain free from infection Outcome: Adequate for Discharge Goal:  Diagnostic test results will improve Outcome: Adequate for Discharge Goal: Respiratory complications will improve Outcome: Adequate for Discharge Goal: Cardiovascular complication will be avoided Outcome: Adequate for Discharge   Problem: Activity: Goal: Risk for activity intolerance will decrease Outcome: Adequate for Discharge   Problem: Nutrition: Goal: Adequate nutrition will be maintained Outcome: Adequate for Discharge   Problem: Coping: Goal: Level of anxiety will decrease Outcome: Adequate for Discharge   Problem: Elimination: Goal: Will not experience complications related to bowel motility Outcome: Adequate for Discharge Goal: Will not experience complications related to urinary retention Outcome: Adequate for Discharge   Problem: Pain Managment: Goal: General experience of comfort will improve and/or be controlled Outcome: Adequate for Discharge   Problem: Safety: Goal: Ability to remain free from injury will improve Outcome: Adequate for Discharge   Problem: Skin Integrity: Goal: Risk for impaired skin integrity will decrease Outcome: Adequate for Discharge   Problem: Education: Goal: Understanding of CV disease, CV risk reduction, and recovery process will improve Outcome: Adequate for Discharge Goal: Individualized Educational Video(s) Outcome: Adequate for Discharge   Problem: Activity: Goal: Ability to return to baseline activity level will improve Outcome: Adequate for Discharge   Problem: Cardiovascular: Goal: Ability to achieve and maintain adequate cardiovascular perfusion will improve Outcome: Adequate for Discharge Goal: Vascular access site(s) Level 0-1 will be maintained Outcome: Adequate for Discharge   Problem: Health Behavior/Discharge Planning: Goal: Ability to safely manage health-related needs after discharge will improve Outcome: Adequate for Discharge

## 2024-03-27 NOTE — Progress Notes (Signed)
 Patient alert and oriented verbalized understanding of dc intructions. All belongings given to patient.

## 2024-03-29 ENCOUNTER — Encounter (HOSPITAL_COMMUNITY): Payer: Self-pay | Admitting: Cardiology

## 2024-03-29 LAB — LIPOPROTEIN A (LPA): Lipoprotein (a): 231.2 nmol/L — ABNORMAL HIGH (ref <75.0)

## 2024-03-31 ENCOUNTER — Encounter: Payer: MEDICAID | Admitting: Student

## 2024-03-31 NOTE — Progress Notes (Deleted)
 CC: ***  HPI:  Vanessa Case is a 53 y.o. female living with a history stated below and presents today for ***. Please see problem based assessment and plan for additional details.  Past Medical History:  Diagnosis Date   Anemia    Anxiety    Arthritis    Cholelithiasis with acute cholecystitis 06/28/2013   Chronic abdominal pain    Chronic back pain    Chronic headache    Chronic neck pain    Coronary artery disease    Depression    Diabetes mellitus    adult onset dm-maintained on glipizide  and metformin , pt states fasting glucose runs 110   Fibromyalgia    GERD (gastroesophageal reflux disease)    HLD (hyperlipidemia) 05/30/2015   Hyperlipidemia    Hypertension    maintained on lisinopril  hct, metoprolol -134/86 at pat visit   Neuromuscular disorder (HCC)    Neuropathy    NSTEMI (non-ST elevated myocardial infarction) (HCC) 02/05/2020   Obesity    PONV (postoperative nausea and vomiting)    PTSD (post-traumatic stress disorder)    Right arm weakness 03/05/2013   Substance abuse (HCC)     Current Outpatient Medications on File Prior to Visit  Medication Sig Dispense Refill   acetaminophen  (TYLENOL ) 500 MG tablet Take 2 tablets (1,000 mg total) by mouth every 8 (eight) hours as needed for mild pain (pain score 1-3). 30 tablet 0   [Paused] amLODipine  (NORVASC ) 5 MG tablet Take 1 tablet (5 mg total) by mouth daily. 30 tablet 11   aspirin  EC 81 MG EC tablet Take 1 tablet (81 mg total) by mouth daily. 90 tablet 2   empagliflozin  (JARDIANCE ) 10 MG TABS tablet Take 1 tablet (10 mg total) by mouth daily before breakfast. 30 tablet 2   ezetimibe  (ZETIA ) 10 MG tablet Take 1 tablet (10 mg total) by mouth daily. 90 tablet 3   ferrous sulfate  325 (65 FE) MG tablet Take 1 tablet (325 mg total) by mouth daily with breakfast. 30 tablet 0   FLUoxetine  (PROZAC ) 20 MG capsule Take 3 capsules (60 mg total) by mouth daily. 90 capsule 1   fluticasone  (FLOVENT  HFA) 44 MCG/ACT  inhaler Inhale 2 puffs into the lungs 2 (two) times daily as needed (for shortness of breath). 1 Inhaler 0   gabapentin  (NEURONTIN ) 300 MG capsule Take 2 capsules (600 mg total) by mouth 3 (three) times daily. TAKE 2 CAPSULE BY MOUTH IN THE MORNING, 2 CAPSULE IN THE EVENING, AND 2 CAPSULES AT NIGHT (Patient taking differently: Take 600 mg by mouth 3 (three) times daily.) 270 capsule 1   hydrOXYzine  (VISTARIL ) 25 MG capsule Take 25-50 mg by mouth 2 (two) times daily as needed for anxiety or itching.     [Paused] irbesartan  (AVAPRO ) 150 MG tablet Take 150 mg by mouth daily.     metFORMIN  (GLUCOPHAGE ) 1000 MG tablet Take 1 tablet (1,000 mg total) by mouth 2 (two) times daily with a meal. 90 tablet 3   metoprolol  succinate (TOPROL -XL) 25 MG 24 hr tablet Take 1 tablet (25 mg total) by mouth daily. 30 tablet 0   Multiple Vitamins-Calcium  (ONE-A-DAY WOMENS FORMULA PO) Take 1 tablet by mouth daily.     naltrexone  (DEPADE) 50 MG tablet Take 50 mg by mouth daily as needed (For urges and cravings).     nitroGLYCERIN  (NITROSTAT ) 0.4 MG SL tablet Place 1 tablet (0.4 mg total) under the tongue every 5 (five) minutes as needed for chest pain. 25 tablet 0  prazosin  (MINIPRESS ) 1 MG capsule Take 1 capsule (1 mg total) by mouth at bedtime. To prevent nightmares 30 capsule 1   QUEtiapine  (SEROQUEL ) 50 MG tablet Take 1 tablet (50 mg total) by mouth at bedtime. 30 tablet 0   rosuvastatin  (CRESTOR ) 40 MG tablet Take 1 tablet (40 mg total) by mouth daily at 6 PM. 30 tablet 0   Semaglutide , 1 MG/DOSE, 4 MG/3ML SOPN Inject 1 mg into the skin once a week. (Patient taking differently: Inject 1.5 mg into the skin once a week.) 3 mL 3   traZODone  (DESYREL ) 50 MG tablet Take 1 tablet (50 mg total) by mouth at bedtime as needed for sleep.     No current facility-administered medications on file prior to visit.    Family History  Problem Relation Age of Onset   Diabetes Mother    Atrial fibrillation Mother    Cystic fibrosis  Father    Diabetes Maternal Grandmother    Heart disease Maternal Grandfather     Social History   Socioeconomic History   Marital status: Divorced    Spouse name: Not on file   Number of children: 1   Years of education: Not on file   Highest education level: Not on file  Occupational History   Not on file  Tobacco Use   Smoking status: Former    Current packs/day: 0.00    Types: Cigarettes    Quit date: 01/2020    Years since quitting: 4.1   Smokeless tobacco: Never   Tobacco comments:    has smoked for 14 years total - quit for 6 years at one point but has been smoking the last 3 years  Vaping Use   Vaping status: Never Used  Substance and Sexual Activity   Alcohol  use: Not Currently    Alcohol /week: 0.0 standard drinks of alcohol    Drug use: Yes    Types: Cocaine, Marijuana, Crack cocaine    Comment: last used cocaine 6/19 at 1800 and marijuana 6/20 0700   Sexual activity: Not on file  Other Topics Concern   Not on file  Social History Narrative   Not on file   Social Drivers of Health   Financial Resource Strain: High Risk (11/17/2023)   Overall Financial Resource Strain (CARDIA)    Difficulty of Paying Living Expenses: Very hard  Food Insecurity: No Food Insecurity (03/26/2024)   Hunger Vital Sign    Worried About Running Out of Food in the Last Year: Never true    Ran Out of Food in the Last Year: Never true  Transportation Needs: No Transportation Needs (03/26/2024)   PRAPARE - Administrator, Civil Service (Medical): No    Lack of Transportation (Non-Medical): No  Physical Activity: Sufficiently Active (11/17/2023)   Exercise Vital Sign    Days of Exercise per Week: 5 days    Minutes of Exercise per Session: 50 min  Stress: Stress Concern Present (11/17/2023)   [employer] of Occupational Health - Occupational Stress Questionnaire    Feeling of Stress : Very much  Social Connections: Socially Isolated (11/17/2023)   Social Connection and  Isolation Panel [NHANES]    Frequency of Communication with Friends and Family: Never    Frequency of Social Gatherings with Friends and Family: Never    Attends Religious Services: Never    Database administrator or Organizations: No    Attends Banker Meetings: Never    Marital Status: Separated  Intimate Partner Violence:  Not At Risk (03/26/2024)   Humiliation, Afraid, Rape, and Kick questionnaire    Fear of Current or Ex-Partner: No    Emotionally Abused: No    Physically Abused: No    Sexually Abused: No    Review of Systems: ROS negative except for what is noted on the assessment and plan.  There were no vitals filed for this visit.  Physical Exam  Physical Exam: Constitutional: well-appearing *** sitting in ***, in no acute distress HENT: normocephalic atraumatic, mucous membranes moist Eyes: conjunctiva non-erythematous Neck: supple Cardiovascular: regular rate and rhythm, no m/r/g Pulmonary/Chest: normal work of breathing on room air, lungs clear to auscultation bilaterally Abdominal: soft, non-tender, non-distended MSK: *** Neurological: alert & oriented x 3, 5/5 strength in bilateral upper and lower extremities, normal gait Skin: warm and dry Psych: ***  Assessment & Plan:   No problem-specific Assessment & Plan notes found for this encounter. PMH: NSTEMI, HTN, T2DM with neuropathy, lumbar radiculopathy, PTSD, substance use, MDD, GAD, HLD, bipolar  HFU 6/5-6/7 for chest pain c/f unstable angina, trops negative, EKG stable PMH of NSTEMI s/p LCX DES in 01/2020, T2DM, HTN, HLD, GAD, PTSD, and depression who presented with chest pain, admitted on 03/25/2024 for unstable angina.   -LHC showed patent LCX stent and no obstructive CAD -Echo normal/unremarkable -Started on Crestor  40 and metoprolol  25 mg daily -On Zetia  and ASA 81  HTN HOLD irbesartan  and amlodipine  -On metoprolol  25  T2DM 6.9% on 03/2024 -Jardiance  10mg , metformin  1000 mg BID and  Ozempic  1 mg -LDL 105, crestor  40 and zetia  10  -NEEDS UACR, FOOT, EYE  CARE: HCV, PCV, PAP, COLO   Feet swelling and pain    Patient {GC/GE:[id number]::discussed with,seen with} Dr. {[id number]::[id number],G. Hoffman,Mullen,Narendra,Vincent,Guilloud,Lau,Machen}  Jearldine Mina, D.O. [facility] Internal Medicine, PGY-2 Phone: [phone] Date 03/31/2024 Time 8:25 AM

## 2024-04-08 ENCOUNTER — Encounter: Payer: Self-pay | Admitting: Student

## 2024-04-08 ENCOUNTER — Ambulatory Visit: Payer: MEDICAID | Admitting: Student

## 2024-04-08 VITALS — BP 107/76 | HR 103 | Temp 98.5°F | Ht 65.0 in | Wt 172.0 lb

## 2024-04-08 DIAGNOSIS — D509 Iron deficiency anemia, unspecified: Secondary | ICD-10-CM | POA: Diagnosis not present

## 2024-04-08 DIAGNOSIS — M25531 Pain in right wrist: Secondary | ICD-10-CM

## 2024-04-08 DIAGNOSIS — Z7984 Long term (current) use of oral hypoglycemic drugs: Secondary | ICD-10-CM

## 2024-04-08 DIAGNOSIS — E1142 Type 2 diabetes mellitus with diabetic polyneuropathy: Secondary | ICD-10-CM | POA: Diagnosis not present

## 2024-04-08 DIAGNOSIS — I10 Essential (primary) hypertension: Secondary | ICD-10-CM | POA: Diagnosis not present

## 2024-04-08 DIAGNOSIS — Z7985 Long-term (current) use of injectable non-insulin antidiabetic drugs: Secondary | ICD-10-CM

## 2024-04-08 MED ORDER — ACETAMINOPHEN 500 MG PO TABS
1000.0000 mg | ORAL_TABLET | Freq: Three times a day (TID) | ORAL | 0 refills | Status: AC | PRN
Start: 2024-04-08 — End: ?

## 2024-04-08 MED ORDER — OZEMPIC (2 MG/DOSE) 8 MG/3ML ~~LOC~~ SOPN: 2.0000 mg | PEN_INJECTOR | SUBCUTANEOUS | 3 refills | Status: DC

## 2024-04-08 MED ORDER — FERROUS SULFATE 325 (65 FE) MG PO TABS: 325.0000 mg | ORAL_TABLET | ORAL | 0 refills | Status: AC

## 2024-04-08 MED ORDER — GABAPENTIN 300 MG PO CAPS
600.0000 mg | ORAL_CAPSULE | Freq: Three times a day (TID) | ORAL | 1 refills | Status: DC
Start: 2024-04-08 — End: 2024-08-20

## 2024-04-08 NOTE — Assessment & Plan Note (Addendum)
 Antihypertensives were paused on discharge from hospital. BP 107/76 today. Will discontinue irbesartan , amlodipine , and metoprolol .  For recurrent hypertension I would restart metoprolol  first because of her mild chronic tachycardia.

## 2024-04-08 NOTE — Patient Instructions (Signed)
 Return in about 3 months (around 07/09/2024) for diabetes and hypertension.  Remember to bring all of the medications that you take (including over the counter medications and supplements) with you to every clinic visit.  This after visit summary is an important review of tests, referrals, and medication changes that were discussed during your visit. If you have questions or concerns, call [phone]. Outside of clinic business hours, call the main hospital at [phone] and ask the operator for the on-call internal medicine resident.   Adria Hopkins MD 04/08/2024, 3:07 PM

## 2024-04-08 NOTE — Assessment & Plan Note (Signed)
 Normocytic, may be mildly iron deficient.  No bleeding that is overt or noticeable.  Iron supplementation was started on discharge from the hospital.  This was constipating.  I recommend she space out her iron to once every other day.  She needs diagnostic colonoscopy, referral to gastroenterology was requested.

## 2024-04-08 NOTE — Assessment & Plan Note (Signed)
 A1c down a lot in the last few months, now 6.9. Doing well on Jardiance  10, metformin  1000 bid, and ozempic  1 mg weekly. Increasing ozempic  to 2 mg weekly today. Urine albumin today.

## 2024-04-08 NOTE — Assessment & Plan Note (Signed)
 Around site of radial artery catheterization.  She is neurovascularly intact in that hand.  She did not have any objective signs of inflammation there.  Low suspicion for something risky or dangerous causing her pain.  Refill Tylenol  and gabapentin .

## 2024-04-08 NOTE — Progress Notes (Signed)
 Patient name: Vanessa Case Date of birth: 1970/10/29 Date of visit: 04/08/24  Subjective   Chief concern: right arm is hurting a lot  Admitted from June 6 to June 7 for chest pain.  Found to have nonobstructing coronary artery disease.  Chest pain thought to be due to acid reflux type symptoms.  History of NSTEMI in the past had stent placed to the mid left circumflex, this was widely patent on her recent left heart cath.  Since discharge she has been doing well.  She is having some pain around the site of radial artery catheterization.  Its gotten a little bit better over the last 2 weeks but still bothering her.  Review of Systems  Cardiovascular:  Negative for chest pain.  Gastrointestinal:  Positive for heartburn. Negative for blood in stool.       Dark stool since starting iron  Genitourinary:  Negative for hematuria.  Musculoskeletal:        Right wrist pain  Psychiatric/Behavioral:  Negative for depression.     Current Outpatient Medications  Medication Instructions   Acetaminophen  Extra Strength 1,000 mg, Oral, Every 8 hours PRN   [Paused] amLODipine  (NORVASC ) 5 mg, Oral, Daily   aspirin  EC 81 mg, Oral, Daily   empagliflozin  (JARDIANCE ) 10 mg, Oral, Daily before breakfast   ezetimibe  (ZETIA ) 10 mg, Oral, Daily   FeroSul 325 mg, Oral, Daily with breakfast   FLUoxetine  (PROZAC ) 60 mg, Oral, Daily   fluticasone  (FLOVENT  HFA) 44 MCG/ACT inhaler 2 puffs, Inhalation, 2 times daily PRN   gabapentin  (NEURONTIN ) 600 mg, Oral, 3 times daily, TAKE 2 CAPSULE BY MOUTH IN THE MORNING, 2 CAPSULE IN THE EVENING, AND 2 CAPSULES AT NIGHT   hydrOXYzine  (VISTARIL ) 25-50 mg, Oral, 2 times daily PRN   [Paused] irbesartan  (AVAPRO ) 150 mg, Oral, Daily   metFORMIN  (GLUCOPHAGE ) 1,000 mg, Oral, 2 times daily with meals   metoprolol  succinate (TOPROL -XL) 25 mg, Oral, Daily   Multiple Vitamins-Calcium  (ONE-A-DAY WOMENS FORMULA PO) 1 tablet, Daily   naltrexone  (DEPADE) 50 mg, Daily PRN    nitroGLYCERIN  (NITROSTAT ) 0.4 mg, Sublingual, Every 5 min PRN   prazosin  (MINIPRESS ) 1 mg, Oral, Daily at bedtime, To prevent nightmares   QUEtiapine  (SEROQUEL ) 50 mg, Oral, Daily at bedtime   rosuvastatin  (CRESTOR ) 40 mg, Oral, Daily-1800   Semaglutide  (1 MG/DOSE) 1 mg, Subcutaneous, Weekly   traZODone  (DESYREL ) 50 mg, Oral, At bedtime PRN     Objective  Today's Vitals   04/08/24 1405  BP: 107/76  Pulse: (!) 103  Temp: 98.5 F (36.9 C)  TempSrc: Oral  SpO2: 99%  Weight: 172 lb (78 kg)  Height: 5' 5 (1.651 m)  PainSc: 7   PainLoc: Arm  Body mass index is 28.62 kg/m.   Physical Exam Constitutional:      General: She is not in acute distress.    Appearance: Normal appearance.  HENT:     Right Ear: Tympanic membrane, ear canal and external ear normal.     Left Ear: Tympanic membrane, ear canal and external ear normal.     Mouth/Throat:     Mouth: Mucous membranes are moist.     Pharynx: No oropharyngeal exudate or posterior oropharyngeal erythema.   Eyes:     Extraocular Movements: Extraocular movements intact.     Conjunctiva/sclera: Conjunctivae normal.     Pupils: Pupils are equal, round, and reactive to light.   Neck:     Thyroid: No thyroid mass, thyromegaly or thyroid tenderness.  Vascular: No carotid bruit.   Cardiovascular:     Rate and Rhythm: Normal rate and regular rhythm.     Pulses: Normal pulses.     Heart sounds: No murmur heard. Pulmonary:     Effort: Pulmonary effort is normal.     Breath sounds: Normal breath sounds. No wheezing or rales.  Abdominal:     Palpations: Abdomen is soft. There is no hepatomegaly, splenomegaly or mass.     Tenderness: There is no abdominal tenderness.   Musculoskeletal:     Right lower leg: No edema.     Left lower leg: No edema.     Comments: Pain and tenderness around site of radial artery catheterization on right wrist, no swelling or erythema  Lymphadenopathy:     Cervical: No cervical adenopathy.    Skin:    General: Skin is warm and dry.   Neurological:     Mental Status: She is alert. Mental status is at baseline.     Cranial Nerves: No facial asymmetry.     Motor: No tremor.     Deep Tendon Reflexes: Reflexes normal.   Psychiatric:        Mood and Affect: Mood normal.        Behavior: Behavior normal.      Assessment & Plan  Problem List Items Addressed This Visit     Type 2 diabetes mellitus with diabetic polyneuropathy (HCC) - Primary   A1c down a lot in the last few months, now 6.9. Doing well on Jardiance  10, metformin  1000 bid, and ozempic  1 mg weekly. Increasing ozempic  to 2 mg weekly today. Urine albumin today.      Relevant Medications   gabapentin  (NEURONTIN ) 300 MG capsule   Semaglutide , 2 MG/DOSE, (OZEMPIC , 2 MG/DOSE,) 8 MG/3ML SOPN   Other Relevant Orders   Microalbumin / creatinine urine ratio   Essential hypertension   Antihypertensives were paused on discharge from hospital. BP 107/76 today. Will discontinue irbesartan , amlodipine , and metoprolol .  For recurrent hypertension I would restart metoprolol  first because of her mild chronic tachycardia.      Anemia   Normocytic, may be mildly iron deficient.  No bleeding that is overt or noticeable.  Iron supplementation was started on discharge from the hospital.  This was constipating.  I recommend she space out her iron to once every other day.  She needs diagnostic colonoscopy, referral to gastroenterology was requested.      Relevant Medications   ferrous sulfate  325 (65 FE) MG tablet   Other Relevant Orders   Ambulatory referral to Gastroenterology   Right wrist pain   Around site of radial artery catheterization.  She is neurovascularly intact in that hand.  She did not have any objective signs of inflammation there.  Low suspicion for something risky or dangerous causing her pain.  Refill Tylenol  and gabapentin .      Relevant Medications   acetaminophen  (TYLENOL ) 500 MG tablet   gabapentin   (NEURONTIN ) 300 MG capsule   Return in about 3 months (around 07/09/2024) for diabetes and hypertension.  Adria Hopkins MD 04/08/2024, 2:45 PM

## 2024-04-09 ENCOUNTER — Other Ambulatory Visit: Payer: Self-pay | Admitting: Student

## 2024-04-09 DIAGNOSIS — E1142 Type 2 diabetes mellitus with diabetic polyneuropathy: Secondary | ICD-10-CM

## 2024-04-09 LAB — MICROALBUMIN / CREATININE URINE RATIO
Creatinine, Urine: 202.8 mg/dL
Microalb/Creat Ratio: 25 mg/g{creat} (ref 0–29)
Microalbumin, Urine: 50.4 ug/mL

## 2024-04-12 ENCOUNTER — Ambulatory Visit: Payer: Self-pay | Admitting: Student

## 2024-04-14 NOTE — Progress Notes (Signed)
 Internal Medicine Clinic Attending  Case discussed with the resident at the time of the visit.  We reviewed the resident's history and exam and pertinent patient test results.  I agree with the assessment, diagnosis, and plan of care documented in the resident's note.

## 2024-04-16 ENCOUNTER — Ambulatory Visit: Payer: MEDICAID | Admitting: Orthopaedic Surgery
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
## 2024-04-19 ENCOUNTER — Ambulatory Visit: Payer: MEDICAID | Attending: Nurse Practitioner | Admitting: Cardiology

## 2024-04-19 ENCOUNTER — Encounter: Payer: Self-pay | Admitting: Cardiology

## 2024-04-19 VITALS — BP 128/76 | HR 96 | Ht 65.0 in | Wt 170.8 lb

## 2024-04-19 DIAGNOSIS — I10 Essential (primary) hypertension: Secondary | ICD-10-CM

## 2024-04-19 DIAGNOSIS — E1142 Type 2 diabetes mellitus with diabetic polyneuropathy: Secondary | ICD-10-CM

## 2024-04-19 DIAGNOSIS — I2510 Atherosclerotic heart disease of native coronary artery without angina pectoris: Secondary | ICD-10-CM

## 2024-04-19 DIAGNOSIS — E782 Mixed hyperlipidemia: Secondary | ICD-10-CM | POA: Diagnosis not present

## 2024-04-19 NOTE — Patient Instructions (Signed)
 Lab Work: FASTING LIPIDs IN [place] If you have labs (blood work) drawn today and your tests are completely normal, you will receive your results only by: [employer] (if you have MyChart) OR A paper copy in the mail If you have any lab test that is abnormal or we need to change your treatment, we will call you to review the results.  Follow-Up: At [facility], you and your health needs are our priority.  As part of our continuing mission to provide you with exceptional heart care, our providers are all part of one team.  This team includes your primary Cardiologist (physician) and Advanced Practice Providers or APPs (Physician Assistants and Nurse Practitioners) who all work together to provide you with the care you need, when you need it.  Your next appointment:   6 month(s)  Provider:   Shelda Bruckner, MD

## 2024-04-28 ENCOUNTER — Ambulatory Visit: Payer: MEDICAID | Admitting: Orthopaedic Surgery

## 2024-05-04 ENCOUNTER — Ambulatory Visit: Payer: MEDICAID | Admitting: Orthopaedic Surgery

## 2024-05-11 ENCOUNTER — Ambulatory Visit: Payer: MEDICAID | Admitting: Orthopaedic Surgery

## 2024-05-11 ENCOUNTER — Encounter: Payer: Self-pay | Admitting: Orthopaedic Surgery

## 2024-05-11 DIAGNOSIS — G8929 Other chronic pain: Secondary | ICD-10-CM

## 2024-05-11 DIAGNOSIS — M25511 Pain in right shoulder: Secondary | ICD-10-CM

## 2024-05-11 NOTE — Progress Notes (Signed)
 Office Visit Note   Patient: Vanessa Case           Date of Birth: [birth year]           MRN: [id number] Visit Date: 05/11/2024              Requested by: Nooruddin, Saad, MD [address] [place], [address] [place],  [place] [address] PCP: Nooruddin, Saad, MD   Assessment & Plan: Visit Diagnoses:  1. Chronic right shoulder pain     Plan: History of Present Illness Elizette Silena Merkley is a 53 year old female with diabetes who presents with worsening right shoulder pain.  She has experienced worsening right shoulder pain for six months, beginning after a cortisone injection in January. The pain radiates to her elbows and disrupts her sleep. An MRI in December showed no structural abnormalities in the shoulder.  Physical Exam MUSCULOSKELETAL: Right shoulder abduction to 95 degrees with pain. Right shoulder flexion to 145-150 degrees with pain.  Results LABS A1c: 6.9  RADIOLOGY Shoulder MRI: No structural abnormalities (09/2023)  Assessment and Plan Right shoulder pain due to adhesive capsulitis Chronic right shoulder pain worsening post-cortisone injection. Severe pain disrupting sleep, radiating to elbow. Previous MRI showed no structural abnormalities. - referral to Dr. Burnetta for repeat steroid injection right shoulder  Follow-Up Instructions: No follow-ups on file.   Orders:  No orders of the defined types were placed in this encounter.  No orders of the defined types were placed in this encounter.     Procedures: No procedures performed   Clinical Data: No additional findings.   Subjective: Chief Complaint  Patient presents with   Right Shoulder - Pain    HPI  Review of Systems  Constitutional: Negative.   HENT: Negative.    Eyes: Negative.   Respiratory: Negative.    Cardiovascular: Negative.   Endocrine: Negative.   Musculoskeletal: Negative.   Neurological: Negative.   Hematological: Negative.   Psychiatric/Behavioral: Negative.     All other systems reviewed and are negative.    Objective: Vital Signs: LMP 10/07/2011   Physical Exam Vitals and nursing note reviewed.  Constitutional:      Appearance: She is well-developed.  HENT:     Head: Normocephalic and atraumatic.  Pulmonary:     Effort: Pulmonary effort is normal.  Abdominal:     Palpations: Abdomen is soft.  Musculoskeletal:     Cervical back: Neck supple.  Skin:    General: Skin is warm.     Capillary Refill: Capillary refill takes less than 2 seconds.  Neurological:     Mental Status: She is alert and oriented to person, place, and time.  Psychiatric:        Behavior: Behavior normal.        Thought Content: Thought content normal.        Judgment: Judgment normal.     Ortho Exam  Specialty Comments:  No specialty comments available.  Imaging: No results found.   PMFS History: Patient Active Problem List   Diagnosis Date Noted   Right wrist pain 04/08/2024   Anemia 03/25/2024   Post-operative state 12/11/2023   Urinary frequency 11/17/2023   Partial thickness rotator cuff tear 08/16/2023   GAD (generalized anxiety disorder) 08/13/2023   PTSD (post-traumatic stress disorder) 08/13/2023   Cannabis use disorder, severe, dependence (HCC) 08/13/2023   Lumbar radiculopathy 03/31/2023   Postherpetic neuralgia 12/24/2022   Substance abuse (HCC) 02/08/2020   Major depressive disorder, recurrent, severe without psychotic  features (HCC)    Type 2 diabetes mellitus with diabetic polyneuropathy (HCC) 05/30/2015   Essential hypertension 05/30/2015   Hyperlipidemia 05/30/2015   Bipolar 2 disorder, major depressive episode (HCC) 09/20/2014   Past Medical History:  Diagnosis Date   Anemia    Anxiety    Arthritis    Cholelithiasis with acute cholecystitis 06/28/2013   Chronic abdominal pain    Chronic back pain    Chronic headache    Chronic neck pain    Coronary artery disease    Depression    Diabetes mellitus    adult onset  dm-maintained on glipizide  and metformin , pt states fasting glucose runs 110   Fibromyalgia    GERD (gastroesophageal reflux disease)    HLD (hyperlipidemia) 05/30/2015   Hyperlipidemia    Hypertension    maintained on lisinopril  hct, metoprolol -134/86 at pat visit   Neuromuscular disorder (HCC)    Neuropathy    NSTEMI (non-ST elevated myocardial infarction) (HCC) 02/05/2020   Obesity    PONV (postoperative nausea and vomiting)    PTSD (post-traumatic stress disorder)    Right arm weakness 03/05/2013   Substance abuse (HCC)    Unstable angina (HCC) 03/25/2024    Family History  Problem Relation Age of Onset   Diabetes Mother    Atrial fibrillation Mother    Cystic fibrosis Father    Diabetes Maternal Grandmother    Heart disease Maternal Grandfather     Past Surgical History:  Procedure Laterality Date   ABDOMINAL HYSTERECTOMY  10/30/2011   Procedure: HYSTERECTOMY ABDOMINAL;  Surgeon: Debby JULIANNA Lares, MD;  Location: [facility];  Service: Gynecology;  Laterality: N/A;   CESAREAN SECTION  x3   CHOLECYSTECTOMY N/A 06/28/2013   Procedure: LAPAROSCOPIC CHOLECYSTECTOMY;  Surgeon: Vicenta DELENA Poli, MD;  Location: [facility];  Service: General;  Laterality: N/A;   CORONARY STENT INTERVENTION N/A 02/07/2020   Procedure: CORONARY STENT INTERVENTION;  Surgeon: Anner Alm ORN, MD;  [facility]: [facility] INVASIVE CV LAB;  Service: Cardiovascular;  Laterality: N/A;   LEFT HEART CATH AND CORONARY ANGIOGRAPHY N/A 02/07/2020   Procedure: LEFT HEART CATH AND CORONARY ANGIOGRAPHY;  Surgeon: Anner Alm ORN, MD;  Location: [facility] INVASIVE CV LAB;  Service: Cardiovascular;  Laterality: N/A;   LEFT HEART CATH AND CORONARY ANGIOGRAPHY N/A 03/26/2024   Procedure: LEFT HEART CATH AND CORONARY ANGIOGRAPHY;  Surgeon: Swazil[place], Peter M, MD;  Location: [facility] INVASIVE CV LAB;  Service: Cardiovascular;  Laterality: N/A;   SALPINGOOPHORECTOMY  10/30/2011   Procedure: SALPINGO OOPHERECTOMY;  Surgeon: Debby JULIANNA Lares, MD;  Location:  [facility];  Service: Gynecology;  Laterality: Right;   TOOTH EXTRACTION N/A 12/11/2023   Procedure: DENTAL EXTRACTIONS # TWO, SIX, SEVEN, EIGHT, NINE, ELEVEN, THIRTEEN, FIFTEEN, NINETEEN & ALVEOLOPLASTY;  Surgeon: Sheryle Hamilton, DMD;  Location: [facility];  Service: Oral Surgery;  Laterality: N/A;   WRIST SURGERY Left    carpel tunnel and tendonitis   Social History   Occupational History   Not on file  Tobacco Use   Smoking status: Former    Current packs/day: 0.00    Types: Cigarettes    Quit date: 01/2020    Years since quitting: 4.3   Smokeless tobacco: Never   Tobacco comments:    has smoked for 14 years total - quit for 6 years at one point but has been smoking the last 3 years  Vaping Use   Vaping status: Never Used  Substance and Sexual Activity   Alcohol  use: Not Currently    Alcohol /week:  0.0 standard drinks of alcohol    Drug use: Yes    Types: Cocaine, Marijuana, Crack cocaine    Comment: last used cocaine 6/19 at 1800 and marijuana 6/20 0700   Sexual activity: Not on file

## 2024-05-25 ENCOUNTER — Other Ambulatory Visit: Payer: Self-pay

## 2024-05-25 ENCOUNTER — Ambulatory Visit: Payer: MEDICAID | Admitting: Sports Medicine

## 2024-05-25 DIAGNOSIS — M25511 Pain in right shoulder: Secondary | ICD-10-CM

## 2024-05-25 DIAGNOSIS — G8929 Other chronic pain: Secondary | ICD-10-CM | POA: Diagnosis not present

## 2024-05-25 DIAGNOSIS — M7501 Adhesive capsulitis of right shoulder: Secondary | ICD-10-CM

## 2024-05-25 MED ORDER — LIDOCAINE HCL 1 % IJ SOLN
3.0000 mL | INTRAMUSCULAR | Status: AC | PRN
  Administered 2024-05-25: 3 mL

## 2024-05-25 MED ORDER — BUPIVACAINE HCL 0.25 % IJ SOLN
3.0000 mL | INTRAMUSCULAR | Status: AC | PRN
Start: 2024-05-25 — End: 2024-05-25
  Administered 2024-05-25: 3 mL via INTRA_ARTICULAR

## 2024-05-25 MED ORDER — METHYLPREDNISOLONE ACETATE 40 MG/ML IJ SUSP
40.0000 mg | INTRAMUSCULAR | Status: AC | PRN
  Administered 2024-05-25: 40 mg via INTRA_ARTICULAR

## 2024-05-25 NOTE — Progress Notes (Addendum)
   Office & Procedure Note  Patient: Vanessa Case             Date of Birth: 10-Apr-1971           MRN: [id number]             Visit Date: 05/25/2024  HPI: Vanessa Case is a very pleasant female who presents with ongoing right shoulder pain in the setting of adhesive capsulitis.  We did perform a glenohumeral joint injection back in January of this year which gave her good relief.  Recently her pain has been worsening where this is keeping her awake at night.  She still has some stiffness in the shoulder although has done formal PT and does have her home exercises, we did review these today.  Lab Results  Component Value Date   HGBA1C 6.9 (H) 03/25/2024   PE: She is well-appearing, no acute distress. She has both active and passive restriction with abduction to about 100 degrees, able to take slightly further passively.  Flexion blocked end range of motion.  No redness or swelling.  Visit Diagnoses:  1. Chronic right shoulder pain   2. Adhesive capsulitis of right shoulder     Procedures: Large Joint Inj: R glenohumeral on 05/25/2024 10:11 AM Indications: pain Details: 22 G 3.5 in needle, ultrasound-guided posterior approach Medications: 3 mL lidocaine  1 %; 3 mL bupivacaine  0.25 %; 40 mg methylPREDNISolone  acetate 40 MG/ML Outcome: tolerated well, no immediate complications  US -guided glenohumeral joint injection, right shoulder After discussion on risks/benefits/indications, informed verbal consent was obtained. A timeout was then performed. The patient was positioned lying lateral recumbent on examination table. The patient's shoulder was prepped with betadine and multiple alcohol  swabs and utilizing ultrasound guidance, the patient's glenohumeral joint was identified on ultrasound. Using ultrasound guidance a 22-gauge, 3.5 inch needle with a mixture of 3:3:1 cc's lidocaine :bupivicaine:depomedrol was directed from a lateral to medial direction via in-plane technique into the  glenohumeral joint with visualization of appropriate spread of injectate into the joint. Patient tolerated the procedure well without immediate complications.      Procedure, treatment alternatives, risks and benefits explained, specific risks discussed. Consent was given by the patient. Patient was prepped and draped in the usual sterile fashion.     Plan: - did proceed with US -guided GHJ injection, patient tolerated procedure well, discussed post-injection protocol - Did review her A1 C and how she reduced her sugars to help improve her adhesive capsulitis.  Discussed tight glucose control, dietary modifications as she has been making.  She will continue her Ozempic  2 mg IM weekly as well as her metformin  1000 mg twice daily. - follow-up with Dr. Jerri as indicated; I am happy to see them as needed - continue HEP after 48-72 hours s/p injection with modified activity  Lonell Sprang, DO Primary Care Sports Medicine Physician  [facility] - Orthopedics  This note was dictated using Dragon naturally speaking software and may contain errors in syntax, spelling, or content which have not been identified prior to signing this note.

## 2024-05-25 NOTE — Addendum Note (Signed)
 Addended by: Jahmya Onofrio W III on: 05/25/2024 10:15 AM   Modules accepted: Level of Service

## 2024-06-01 ENCOUNTER — Encounter: Payer: Self-pay | Admitting: Internal Medicine

## 2024-06-28 ENCOUNTER — Other Ambulatory Visit: Payer: Self-pay | Admitting: Student

## 2024-06-28 DIAGNOSIS — E1142 Type 2 diabetes mellitus with diabetic polyneuropathy: Secondary | ICD-10-CM

## 2024-06-28 NOTE — Telephone Encounter (Signed)
 Medication sent to pharmacy

## 2024-06-29 ENCOUNTER — Telehealth: Payer: Self-pay | Admitting: Cardiovascular Disease

## 2024-06-29 MED ORDER — EZETIMIBE 10 MG PO TABS: 10.0000 mg | ORAL_TABLET | Freq: Every day | ORAL | 2 refills | Status: AC

## 2024-06-29 MED ORDER — ROSUVASTATIN CALCIUM 40 MG PO TABS: 40.0000 mg | ORAL_TABLET | Freq: Every day | ORAL | 2 refills | Status: AC

## 2024-06-29 NOTE — Telephone Encounter (Signed)
*  STAT* If patient is at the pharmacy, call can be transferred to refill team.   1. Which medications need to be refilled? (please list name of each medication and dose if known)  ezetimibe  (ZETIA ) 10 MG tablet  rosuvastatin  (CRESTOR ) 40 MG tablet    2. Would you like to learn more about the convenience, safety, & potential cost savings by using the [facility]?      3. Are you open to using the [facility] (Type [facility].  ).   4. Which pharmacy/location (including street and city if local pharmacy) is medication to be sent to?  [id number] - [place], [place] - [address] AT [facility] [address]      5. Do they need a 30 day or 90 day supply? 90 day

## 2024-06-29 NOTE — Telephone Encounter (Signed)
 RX sent in

## 2024-07-07 ENCOUNTER — Ambulatory Visit: Payer: MEDICAID | Admitting: Student

## 2024-08-01 ENCOUNTER — Other Ambulatory Visit: Payer: Self-pay | Admitting: Student

## 2024-08-01 DIAGNOSIS — E1142 Type 2 diabetes mellitus with diabetic polyneuropathy: Secondary | ICD-10-CM

## 2024-08-02 ENCOUNTER — Ambulatory Visit: Payer: Self-pay
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
## 2024-08-02 NOTE — Telephone Encounter (Addendum)
 Called pt - no answer; left message of office's return call. Pt has an appt Wed 10/15 with Dr Harrie.

## 2024-08-02 NOTE — Telephone Encounter (Signed)
 Medication discontinued 04/08/24

## 2024-08-04 ENCOUNTER — Ambulatory Visit: Payer: Self-pay | Admitting: Student

## 2024-08-10 ENCOUNTER — Ambulatory Visit: Payer: MEDICAID | Admitting: Orthopaedic Surgery

## 2024-08-20 ENCOUNTER — Encounter (HOSPITAL_COMMUNITY): Payer: Self-pay

## 2024-08-20 ENCOUNTER — Ambulatory Visit (HOSPITAL_COMMUNITY)
Admission: RE | Admit: 2024-08-20 | Discharge: 2024-08-20 | Disposition: A | Discharge status: Home / Self Care | Payer: MEDICAID | Source: Ambulatory Visit | Attending: Family Medicine | Admitting: Family Medicine

## 2024-08-20 VITALS — BP 116/73 | HR 85 | Temp 98.7°F | Resp 18 | Ht 65.0 in | Wt 164.0 lb

## 2024-08-20 DIAGNOSIS — E1142 Type 2 diabetes mellitus with diabetic polyneuropathy: Secondary | ICD-10-CM | POA: Diagnosis present

## 2024-08-20 DIAGNOSIS — R350 Frequency of micturition: Secondary | ICD-10-CM | POA: Insufficient documentation

## 2024-08-20 DIAGNOSIS — R300 Dysuria: Secondary | ICD-10-CM | POA: Diagnosis present

## 2024-08-20 DIAGNOSIS — R5383 Other fatigue: Secondary | ICD-10-CM | POA: Insufficient documentation

## 2024-08-20 DIAGNOSIS — M25531 Pain in right wrist: Secondary | ICD-10-CM | POA: Insufficient documentation

## 2024-08-20 LAB — POCT URINALYSIS DIP (MANUAL ENTRY)
Blood, UA: NEGATIVE
Glucose, UA: NEGATIVE mg/dL
Nitrite, UA: NEGATIVE
Spec Grav, UA: >=1.03 — AB (ref 1.010–1.025)
Urobilinogen, UA: 1 U/dL
pH, UA: 5.5 (ref 5.0–8.0)

## 2024-08-20 LAB — POCT FASTING CBG KUC MANUAL ENTRY: POCT Glucose (KUC): 121 mg/dL — AB (ref 70–99)

## 2024-08-20 MED ORDER — GABAPENTIN 300 MG PO CAPS: 600.0000 mg | ORAL_CAPSULE | Freq: Three times a day (TID) | ORAL | 1 refills | Status: AC

## 2024-08-20 MED ORDER — OZEMPIC (2 MG/DOSE) 8 MG/3ML ~~LOC~~ SOPN: 2.0000 mg | PEN_INJECTOR | SUBCUTANEOUS | 0 refills | Status: AC

## 2024-08-20 MED ORDER — SULFAMETHOXAZOLE-TRIMETHOPRIM 800-160 MG PO TABS: 1.0000 | ORAL_TABLET | Freq: Two times a day (BID) | ORAL | 0 refills | Status: AC

## 2024-08-20 NOTE — ED Triage Notes (Signed)
 Pain during urination, Fatigue, Left abdominal pain - Entered by patient.   Onset of symptoms around September, a month after a shoulder injection. States the symptoms are getting worse. Has some nausea but denies emesis or diarrhea.   Denies any dietary or medication changes. States she is out of her Ozempic  and Gabapentin .

## 2024-08-20 NOTE — Discharge Instructions (Addendum)
 You were seen today for urinary symptoms and fatigue.  I am treating you for a urinary tract infection with bactrim twice/day x 5 days.  I will send the urine to the lab for culture as well.  Your blood sugar today was This may be due to being out of your ozempic .  I have refilled this today, along with the gabapentin .  If you continue to not feel well, or if you continue to have elevated sugars, then please follow up with your primary care provider for further evaluation.

## 2024-08-20 NOTE — ED Provider Notes (Signed)
 [facility]    CSN: [id number] Arrival date & time: 08/20/24  1214      History   Chief Complaint Chief Complaint  Patient presents with   Appointment    1300   Dysuria    HPI Vanessa Case is a 53 y.o. female.    Dysuria Associated symptoms: abdominal pain    Patient is here for painful urination, fatigue and left abdominal pain.  This started about 8 weeks ago.  She states this started after getting a shoulder injection. This has happened with a previous injection as well. She feels she may be spilling sugar.  She does check her BS, and in the AM her sugar can be 310.  Will fluctuate between 310-290 throughout the day.  She monitors her diet, and not sure what the cause is.  Her A1c was 6.9, down from 13.   She takes ozempic , jardiance , and metformin .  She has been out of ozempic  for 2 weeks.  She is also out of her gabapentin .   Some nausea, but eating/drinking okay.  No  diarrhea or constipation.        Past Medical History:  Diagnosis Date   Anemia    Anxiety    Arthritis    Cholelithiasis with acute cholecystitis 06/28/2013   Chronic abdominal pain    Chronic back pain    Chronic headache    Chronic neck pain    Coronary artery disease    Depression    Diabetes mellitus    adult onset dm-maintained on glipizide  and metformin , pt states fasting glucose runs 110   Fibromyalgia    GERD (gastroesophageal reflux disease)    HLD (hyperlipidemia) 05/30/2015   Hyperlipidemia    Hypertension    maintained on lisinopril  hct, metoprolol -134/86 at pat visit   Neuromuscular disorder (HCC)    Neuropathy    NSTEMI (non-ST elevated myocardial infarction) (HCC) 02/05/2020   Obesity    PONV (postoperative nausea and vomiting)    PTSD (post-traumatic stress disorder)    Right arm weakness 03/05/2013   Substance abuse (HCC)    Unstable angina (HCC) 03/25/2024    Patient Active Problem List   Diagnosis Date Noted   Right wrist pain  04/08/2024   Anemia 03/25/2024   Post-operative state 12/11/2023   Urinary frequency 11/17/2023   Partial thickness rotator cuff tear 08/16/2023   GAD (generalized anxiety disorder) 08/13/2023   PTSD (post-traumatic stress disorder) 08/13/2023   Cannabis use disorder, severe, dependence (HCC) 08/13/2023   Lumbar radiculopathy 03/31/2023   Postherpetic neuralgia 12/24/2022   Substance abuse (HCC) 02/08/2020   Major depressive disorder, recurrent, severe without psychotic features (HCC)    Type 2 diabetes mellitus with diabetic polyneuropathy (HCC) 05/30/2015   Essential hypertension 05/30/2015   Hyperlipidemia 05/30/2015   Bipolar 2 disorder, major depressive episode (HCC) 09/20/2014    Past Surgical History:  Procedure Laterality Date   ABDOMINAL HYSTERECTOMY  10/30/2011   Procedure: HYSTERECTOMY ABDOMINAL;  Surgeon: Debby JULIANNA Lares, MD;  Location: [facility];  Service: Gynecology;  Laterality: N/A;   CESAREAN SECTION  x3   CHOLECYSTECTOMY N/A 06/28/2013   Procedure: LAPAROSCOPIC CHOLECYSTECTOMY;  Surgeon: Vicenta DELENA Poli, MD;  Location: [facility];  Service: General;  Laterality: N/A;   CORONARY STENT INTERVENTION N/A 02/07/2020   Procedure: CORONARY STENT INTERVENTION;  Surgeon: Anner Alm ORN, MD;  Location: [facility] INVASIVE CV LAB;  Service: Cardiovascular;  Laterality: N/A;   LEFT HEART CATH AND CORONARY ANGIOGRAPHY N/A 02/07/2020   Procedure:  LEFT HEART CATH AND CORONARY ANGIOGRAPHY;  Surgeon: Anner Alm ORN, MD;  Location: [facility] INVASIVE CV LAB;  Service: Cardiovascular;  Laterality: N/A;   LEFT HEART CATH AND CORONARY ANGIOGRAPHY N/A 03/26/2024   Procedure: LEFT HEART CATH AND CORONARY ANGIOGRAPHY;  Surgeon: Jordan, Peter M, MD;  Location: [facility] INVASIVE CV LAB;  Service: Cardiovascular;  Laterality: N/A;   SALPINGOOPHORECTOMY  10/30/2011   Procedure: SALPINGO OOPHERECTOMY;  Surgeon: Debby JULIANNA Lares, MD;  Location: [facility];  Service: Gynecology;  Laterality: Right;   TOOTH EXTRACTION N/A  12/11/2023   Procedure: DENTAL EXTRACTIONS # TWO, SIX, SEVEN, EIGHT, NINE, ELEVEN, THIRTEEN, FIFTEEN, NINETEEN & ALVEOLOPLASTY;  Surgeon: Sheryle Hamilton, DMD;  Location: [facility];  Service: Oral Surgery;  Laterality: N/A;   WRIST SURGERY Left    carpel tunnel and tendonitis    OB History     Gravida  3   Para  3   Term  3   Preterm      AB      Living  3      SAB      IAB      Ectopic      Multiple      Live Births               Home Medications    Prior to Admission medications   Medication Sig Start Date End Date Taking? Authorizing Provider  acetaminophen  (TYLENOL ) 500 MG tablet Take 2 tablets (1,000 mg total) by mouth every 8 (eight) hours as needed for mild pain (pain score 1-3). 04/08/24  Yes Norrine Sharper, MD  aspirin  EC 81 MG EC tablet Take 1 tablet (81 mg total) by mouth daily. 02/09/20  Yes Henry Shaver B, NP  ezetimibe  (ZETIA ) 10 MG tablet Take 1 tablet (10 mg total) by mouth daily. 06/29/24  Yes Lonni Slain, MD  ferrous sulfate  325 (65 FE) MG tablet Take 1 tablet (325 mg total) by mouth every other day. 04/08/24  Yes Norrine Sharper, MD  FLUoxetine  (PROZAC ) 20 MG capsule Take 3 capsules (60 mg total) by mouth daily. Patient taking differently: Take 20 mg by mouth daily. With 40 mg to make 60 mg. 08/13/23  Yes Hoang, Daniela B, MD  FLUoxetine  (PROZAC ) 40 MG capsule Take 40 mg by mouth daily. 05/16/24  Yes [provider]  fluticasone  (FLOVENT  HFA) 44 MCG/ACT inhaler Inhale 2 puffs into the lungs 2 (two) times daily as needed (for shortness of breath). 03/14/16  Yes Patsey Lot, MD  hydrOXYzine  (VISTARIL ) 25 MG capsule Take 25-50 mg by mouth 2 (two) times daily as needed for anxiety or itching. 01/04/24  Yes [provider]  JARDIANCE  10 MG TABS tablet TAKE 1 TABLET(10 MG) BY MOUTH DAILY BEFORE BREAKFAST 06/28/24  Yes Nooruddin, Saad, MD  metFORMIN  (GLUCOPHAGE ) 1000 MG tablet TAKE 1 TABLET(1000 MG) BY MOUTH TWICE DAILY WITH A  MEAL 04/12/24  Yes Nooruddin, Saad, MD  Multiple Vitamins-Calcium  (ONE-A-DAY WOMENS FORMULA PO) Take 1 tablet by mouth daily.   Yes [provider]  naltrexone  (DEPADE) 50 MG tablet Take 50 mg by mouth daily as needed (For urges and cravings).   Yes [provider]  nitroGLYCERIN  (NITROSTAT ) 0.4 MG SL tablet Place 1 tablet (0.4 mg total) under the tongue every 5 (five) minutes as needed for chest pain. 03/27/24  Yes Jolaine Pac, DO  prazosin  (MINIPRESS ) 1 MG capsule Take 1 capsule (1 mg total) by mouth at bedtime. To prevent nightmares 08/13/23  Yes Izella Ismael NOVAK,  MD  QUEtiapine  (SEROQUEL ) 50 MG tablet Take 1 tablet (50 mg total) by mouth at bedtime. 08/27/23  Yes Kober, Charles E, PA-C  rosuvastatin  (CRESTOR ) 40 MG tablet Take 1 tablet (40 mg total) by mouth daily at 6 PM. 06/29/24  Yes Lonni Slain, MD  Semaglutide , 2 MG/DOSE, (OZEMPIC , 2 MG/DOSE,) 8 MG/3ML SOPN Inject 2 mg into the skin once a week. 04/08/24  Yes Norrine Sharper, MD  traZODone  (DESYREL ) 50 MG tablet Take 1 tablet (50 mg total) by mouth at bedtime as needed for sleep. 03/27/24  Yes Jolaine Pac, DO  gabapentin  (NEURONTIN ) 300 MG capsule Take 2 capsules (600 mg total) by mouth 3 (three) times daily. TAKE 2 CAPSULE BY MOUTH IN THE MORNING, 2 CAPSULE IN THE EVENING, AND 2 CAPSULES AT NIGHT 04/08/24 07/07/24  Norrine Sharper, MD    Family History Family History  Problem Relation Age of Onset   Diabetes Mother    Atrial fibrillation Mother    Cystic fibrosis Father    Diabetes Maternal Grandmother    Heart disease Maternal Grandfather     Social History Social History   Tobacco Use   Smoking status: Former    Current packs/day: 0.00    Types: Cigarettes    Quit date: 01/2020    Years since quitting: 4.5   Smokeless tobacco: Never   Tobacco comments:    has smoked for 14 years total - quit for 6 years at one point but has been smoking the last 3 years  Vaping Use   Vaping status: Never  Used  Substance Use Topics   Alcohol  use: Not Currently    Alcohol /week: 0.0 standard drinks of alcohol    Drug use: Yes    Types: Cocaine, Marijuana, Crack cocaine    Comment: last used cocaine 6/19 at 1800 and marijuana 6/20 0700     Allergies   Percocet [oxycodone -acetaminophen ] and Ambien  [zolpidem ]   Review of Systems Review of Systems  Constitutional:  Positive for fatigue.  HENT: Negative.    Respiratory: Negative.    Cardiovascular: Negative.   Gastrointestinal:  Positive for abdominal pain.  Genitourinary:  Positive for dysuria.  Musculoskeletal: Negative.      Physical Exam Triage Vital Signs ED Triage Vitals  Encounter Vitals Group     BP 08/20/24 1249 116/73     Girls Systolic BP Percentile --      Girls Diastolic BP Percentile --      Boys Systolic BP Percentile --      Boys Diastolic BP Percentile --      Pulse Rate 08/20/24 1249 85     Resp 08/20/24 1249 18     Temp 08/20/24 1249 98.7 F (37.1 C)     Temp Source 08/20/24 1249 Oral     SpO2 08/20/24 1249 98 %     Weight 08/20/24 1249 164 lb (74.4 kg)     Height 08/20/24 1249 5' 5 (1.651 m)     Head Circumference --      Peak Flow --      Pain Score 08/20/24 1246 6     Pain Loc --      Pain Education --      Exclude from Growth Chart --    No data found.  Updated Vital Signs BP 116/73 (BP Location: Left Arm)   Pulse 85   Temp 98.7 F (37.1 C) (Oral)   Resp 18   Ht 5' 5 (1.651 m)   Wt 74.4 kg   LMP  10/07/2011   SpO2 98%   BMI 27.29 kg/m   Visual Acuity Right Eye Distance:   Left Eye Distance:   Bilateral Distance:    Right Eye Near:   Left Eye Near:    Bilateral Near:     Physical Exam Constitutional:      General: She is not in acute distress.    Appearance: She is normal weight. She is not ill-appearing or toxic-appearing.  HENT:     Mouth/Throat:     Mouth: Mucous membranes are moist.  Cardiovascular:     Rate and Rhythm: Normal rate and regular rhythm.  Pulmonary:      Effort: Pulmonary effort is normal.     Breath sounds: Normal breath sounds.  Abdominal:     Palpations: Abdomen is soft.     Tenderness: There is no abdominal tenderness. There is no right CVA tenderness, left CVA tenderness, guarding or rebound.  Musculoskeletal:     Cervical back: Normal range of motion and neck supple.  Skin:    General: Skin is warm.  Neurological:     General: No focal deficit present.     Mental Status: She is alert.  Psychiatric:        Mood and Affect: Mood normal.      UC Treatments / Results  Labs (all labs ordered are listed, but only abnormal results are displayed) Labs Reviewed  POCT URINALYSIS DIP (MANUAL ENTRY) - Abnormal; Notable for the following components:      Result Value   Color, UA straw (*)    Clarity, UA cloudy (*)    Bilirubin, UA small (*)    Ketones, POC UA trace (5) (*)    Spec Grav, UA >=1.030 (*)    Protein Ur, POC trace (*)    Leukocytes, UA Small (1+) (*)    All other components within normal limits  URINE CULTURE  POCT FASTING CBG [facility] MANUAL ENTRY   CBG 121  EKG   Radiology No results found.  Procedures Procedures (including critical care time)  Medications Ordered in UC Medications - No data to display  Initial Impression / Assessment and Plan / UC Course  I have reviewed the triage vital signs and the nursing notes.  Pertinent labs & imaging results that were available during my care of the patient were reviewed by me and considered in my medical decision making (see chart for details).   Final Clinical Impressions(s) / UC Diagnoses   Final diagnoses:  Urinary frequency  Type 2 diabetes mellitus with diabetic polyneuropathy, without long-term current use of insulin  (HCC)  Dysuria  Other fatigue     Discharge Instructions      You were seen today for urinary symptoms and fatigue.  I am treating you for a urinary tract infection with bactrim twice/day x 5 days.  I will send the urine to the  lab for culture as well.  Your blood sugar today was This may be due to being out of your ozempic .  I have refilled this today, along with the gabapentin .  If you continue to not feel well, or if you continue to have elevated sugars, then please follow up with your primary care provider for further evaluation.     ED Prescriptions     Medication Sig Dispense Auth. Provider   Semaglutide , 2 MG/DOSE, (OZEMPIC , 2 MG/DOSE,) 8 MG/3ML SOPN Inject 2 mg into the skin once a week. 3 mL Diago Haik, MD   gabapentin  (NEURONTIN ) 300 MG capsule  Take 2 capsules (600 mg total) by mouth 3 (three) times daily. TAKE 2 CAPSULE BY MOUTH IN THE MORNING, 2 CAPSULE IN THE EVENING, AND 2 CAPSULES AT NIGHT 270 capsule Briggett Tuccillo, MD   sulfamethoxazole-trimethoprim (BACTRIM DS) 800-160 MG tablet Take 1 tablet by mouth 2 (two) times daily for 5 days. 10 tablet Darral Longs, MD      PDMP not reviewed this encounter.   Darral Longs, MD 08/20/24 [phone]

## 2024-08-21 LAB — URINE CULTURE: Culture: <10000 — AB

## 2024-08-23 ENCOUNTER — Encounter: Payer: Self-pay | Admitting: Radiology

## 2024-08-23 ENCOUNTER — Ambulatory Visit (HOSPITAL_COMMUNITY): Payer: Self-pay

## 2024-10-29 ENCOUNTER — Ambulatory Visit: Payer: MEDICAID | Attending: Cardiovascular Disease | Admitting: Cardiovascular Disease

## 2024-11-02 ENCOUNTER — Ambulatory Visit: Payer: MEDICAID | Admitting: Orthopaedic Surgery

## 2024-11-04 ENCOUNTER — Ambulatory Visit: Payer: MEDICAID | Admitting: Orthopaedic Surgery
